# Patient Record
Sex: Female | Born: 1970 | Race: White | Hispanic: No | Marital: Married | Smoking: Current every day smoker
Health system: Southern US, Community
[De-identification: ages and names within clinical notes are randomized; demographics above are authoritative.]

## PROBLEM LIST (undated history)

## (undated) DIAGNOSIS — D649 Anemia, unspecified: Secondary | ICD-10-CM

## (undated) DIAGNOSIS — K279 Peptic ulcer, site unspecified, unspecified as acute or chronic, without hemorrhage or perforation: Secondary | ICD-10-CM

## (undated) DIAGNOSIS — F32A Depression, unspecified: Secondary | ICD-10-CM

## (undated) DIAGNOSIS — I10 Essential (primary) hypertension: Secondary | ICD-10-CM

## (undated) DIAGNOSIS — Z8489 Family history of other specified conditions: Secondary | ICD-10-CM

## (undated) DIAGNOSIS — F419 Anxiety disorder, unspecified: Secondary | ICD-10-CM

## (undated) DIAGNOSIS — F329 Major depressive disorder, single episode, unspecified: Secondary | ICD-10-CM

## (undated) DIAGNOSIS — K219 Gastro-esophageal reflux disease without esophagitis: Secondary | ICD-10-CM

## (undated) DIAGNOSIS — K859 Acute pancreatitis without necrosis or infection, unspecified: Secondary | ICD-10-CM

## (undated) HISTORY — PX: INDUCED ABORTION: SHX677

---

## 2004-12-06 ENCOUNTER — Ambulatory Visit: Payer: Self-pay | Admitting: Internal Medicine

## 2005-05-17 ENCOUNTER — Ambulatory Visit: Payer: Self-pay | Admitting: Internal Medicine

## 2005-05-23 ENCOUNTER — Ambulatory Visit: Payer: Self-pay | Admitting: Internal Medicine

## 2006-04-07 ENCOUNTER — Emergency Department: Payer: Self-pay | Admitting: Unknown Physician Specialty

## 2006-08-26 ENCOUNTER — Inpatient Hospital Stay: Payer: Self-pay | Admitting: Internal Medicine

## 2006-11-21 ENCOUNTER — Inpatient Hospital Stay: Payer: Self-pay | Admitting: Internal Medicine

## 2010-01-17 ENCOUNTER — Ambulatory Visit: Payer: Self-pay | Admitting: Certified Nurse Midwife

## 2011-02-12 ENCOUNTER — Ambulatory Visit: Payer: Self-pay

## 2011-04-15 ENCOUNTER — Emergency Department: Payer: Self-pay | Admitting: Emergency Medicine

## 2014-04-26 ENCOUNTER — Emergency Department: Payer: Self-pay | Admitting: Emergency Medicine

## 2014-04-26 LAB — COMPREHENSIVE METABOLIC PANEL
ALK PHOS: 139 U/L — AB
Albumin: 3.9 g/dL (ref 3.4–5.0)
Anion Gap: 10 (ref 7–16)
BILIRUBIN TOTAL: 1.8 mg/dL — AB (ref 0.2–1.0)
BUN: 14 mg/dL (ref 7–18)
CALCIUM: 9.9 mg/dL (ref 8.5–10.1)
CHLORIDE: 95 mmol/L — AB (ref 98–107)
CO2: 30 mmol/L (ref 21–32)
CREATININE: 0.77 mg/dL (ref 0.60–1.30)
EGFR (Non-African Amer.): >60
Glucose: 138 mg/dL — ABNORMAL HIGH (ref 65–99)
Osmolality: 273 (ref 275–301)
Potassium: 2.7 mmol/L — ABNORMAL LOW (ref 3.5–5.1)
SGOT(AST): 36 U/L (ref 15–37)
SGPT (ALT): 34 U/L
SODIUM: 135 mmol/L — AB (ref 136–145)
TOTAL PROTEIN: 9 g/dL — AB (ref 6.4–8.2)

## 2014-04-26 LAB — TROPONIN I: Troponin-I: <0.02 ng/mL

## 2014-04-26 LAB — CBC WITH DIFFERENTIAL/PLATELET
Basophil #: 0 10*3/uL (ref 0.0–0.1)
Basophil %: 0.2 %
Eosinophil #: 0 10*3/uL (ref 0.0–0.7)
Eosinophil %: 0.2 %
HCT: 43.1 % (ref 35.0–47.0)
HGB: 14.1 g/dL (ref 12.0–16.0)
Lymphocyte #: 1.3 10*3/uL (ref 1.0–3.6)
Lymphocyte %: 9.4 %
MCH: 33.5 pg (ref 26.0–34.0)
MCHC: 32.7 g/dL (ref 32.0–36.0)
MCV: 102 fL — ABNORMAL HIGH (ref 80–100)
Monocyte #: 0.8 x10 3/mm (ref 0.2–0.9)
Monocyte %: 5.6 %
NEUTROS PCT: 84.6 %
Neutrophil #: 11.4 10*3/uL — ABNORMAL HIGH (ref 1.4–6.5)
Platelet: 268 10*3/uL (ref 150–440)
RBC: 4.22 10*6/uL (ref 3.80–5.20)
RDW: 20.1 % — ABNORMAL HIGH (ref 11.5–14.5)
WBC: 13.5 10*3/uL — AB (ref 3.6–11.0)

## 2014-04-26 LAB — LIPASE, BLOOD: LIPASE: 111 U/L (ref 73–393)

## 2014-04-27 ENCOUNTER — Encounter (HOSPITAL_COMMUNITY): Payer: Self-pay | Admitting: Emergency Medicine

## 2014-04-27 DIAGNOSIS — Z8719 Personal history of other diseases of the digestive system: Secondary | ICD-10-CM | POA: Diagnosis not present

## 2014-04-27 DIAGNOSIS — R197 Diarrhea, unspecified: Secondary | ICD-10-CM | POA: Diagnosis not present

## 2014-04-27 DIAGNOSIS — Z3202 Encounter for pregnancy test, result negative: Secondary | ICD-10-CM | POA: Insufficient documentation

## 2014-04-27 DIAGNOSIS — R112 Nausea with vomiting, unspecified: Secondary | ICD-10-CM | POA: Insufficient documentation

## 2014-04-27 DIAGNOSIS — R1013 Epigastric pain: Secondary | ICD-10-CM | POA: Diagnosis not present

## 2014-04-27 DIAGNOSIS — Z720 Tobacco use: Secondary | ICD-10-CM | POA: Diagnosis not present

## 2014-04-27 MED ORDER — ONDANSETRON 4 MG PO TBDP
8.0000 mg | ORAL_TABLET | Freq: Once | ORAL | Status: AC
  Administered 2014-04-27: 8 mg via ORAL
  Filled 2014-04-27: qty 2

## 2014-04-27 NOTE — ED Notes (Addendum)
Pt c/o n/v and abdominal pain for two weeks. No fevers at home. Pt states she has hx of pancreatitis with similar symptoms. Last BM was two hours ago and loose.

## 2014-04-27 NOTE — ED Notes (Signed)
Pt reports emesis 3-4 times per day. Unable to keep food and drink down.

## 2014-04-28 ENCOUNTER — Emergency Department (HOSPITAL_COMMUNITY)
Admission: EM | Admit: 2014-04-28 | Discharge: 2014-04-28 | Disposition: A | Discharge status: Home / Self Care | Payer: No Typology Code available for payment source | Attending: Emergency Medicine | Admitting: Emergency Medicine

## 2014-04-28 DIAGNOSIS — R112 Nausea with vomiting, unspecified: Secondary | ICD-10-CM

## 2014-04-28 HISTORY — DX: Acute pancreatitis without necrosis or infection, unspecified: K85.90

## 2014-04-28 LAB — CBC WITH DIFFERENTIAL/PLATELET
BASOS PCT: 0 % (ref 0–1)
Basophils Absolute: 0 10*3/uL (ref 0.0–0.1)
EOS ABS: 0.1 10*3/uL (ref 0.0–0.7)
Eosinophils Relative: 0 % (ref 0–5)
HCT: 40.1 % (ref 36.0–46.0)
Hemoglobin: 14 g/dL (ref 12.0–15.0)
Lymphocytes Relative: 14 % (ref 12–46)
Lymphs Abs: 1.6 10*3/uL (ref 0.7–4.0)
MCH: 34 pg (ref 26.0–34.0)
MCHC: 34.9 g/dL (ref 30.0–36.0)
MCV: 97.3 fL (ref 78.0–100.0)
MONOS PCT: 6 % (ref 3–12)
Monocytes Absolute: 0.6 10*3/uL (ref 0.1–1.0)
NEUTROS PCT: 80 % — AB (ref 43–77)
Neutro Abs: 9.2 10*3/uL — ABNORMAL HIGH (ref 1.7–7.7)
Platelets: 261 10*3/uL (ref 150–400)
RBC: 4.12 MIL/uL (ref 3.87–5.11)
RDW: 17.1 % — ABNORMAL HIGH (ref 11.5–15.5)
WBC: 11.5 10*3/uL — ABNORMAL HIGH (ref 4.0–10.5)

## 2014-04-28 LAB — URINALYSIS, ROUTINE W REFLEX MICROSCOPIC
Glucose, UA: NEGATIVE mg/dL
Ketones, ur: >80 mg/dL — AB
NITRITE: NEGATIVE
PROTEIN: 30 mg/dL — AB
Specific Gravity, Urine: 1.026 (ref 1.005–1.030)
Urobilinogen, UA: 1 mg/dL (ref 0.0–1.0)
pH: 6 (ref 5.0–8.0)

## 2014-04-28 LAB — COMPREHENSIVE METABOLIC PANEL
ALBUMIN: 3.9 g/dL (ref 3.5–5.2)
ALT: 25 U/L (ref 0–35)
ANION GAP: 21 — AB (ref 5–15)
AST: 35 U/L (ref 0–37)
Alkaline Phosphatase: 134 U/L — ABNORMAL HIGH (ref 39–117)
BUN: 14 mg/dL (ref 6–23)
CALCIUM: 10 mg/dL (ref 8.4–10.5)
CO2: 23 mEq/L (ref 19–32)
CREATININE: 0.47 mg/dL — AB (ref 0.50–1.10)
Chloride: 97 mEq/L (ref 96–112)
GFR calc Af Amer: >90 mL/min (ref >90)
GFR calc non Af Amer: >90 mL/min (ref >90)
Glucose, Bld: 107 mg/dL — ABNORMAL HIGH (ref 70–99)
Potassium: 3.1 mEq/L — ABNORMAL LOW (ref 3.7–5.3)
Sodium: 141 mEq/L (ref 137–147)
TOTAL PROTEIN: 8.2 g/dL (ref 6.0–8.3)
Total Bilirubin: 1.1 mg/dL (ref 0.3–1.2)

## 2014-04-28 LAB — PREGNANCY, URINE: PREG TEST UR: NEGATIVE

## 2014-04-28 LAB — I-STAT CG4 LACTIC ACID, ED: LACTIC ACID, VENOUS: 1.12 mmol/L (ref 0.5–2.2)

## 2014-04-28 LAB — URINE MICROSCOPIC-ADD ON

## 2014-04-28 LAB — LIPASE, BLOOD: LIPASE: 21 U/L (ref 11–59)

## 2014-04-28 MED ORDER — SODIUM CHLORIDE 0.9 % IV BOLUS (SEPSIS)
1000.0000 mL | Freq: Once | INTRAVENOUS | Status: AC
  Administered 2014-04-28: 1000 mL via INTRAVENOUS

## 2014-04-28 MED ORDER — ONDANSETRON HCL 4 MG/2ML IJ SOLN
4.0000 mg | Freq: Once | INTRAMUSCULAR | Status: AC
  Administered 2014-04-28: 4 mg via INTRAVENOUS
  Filled 2014-04-28: qty 2

## 2014-04-28 MED ORDER — PANTOPRAZOLE SODIUM 40 MG IV SOLR
40.0000 mg | Freq: Once | INTRAVENOUS | Status: AC
  Administered 2014-04-28: 40 mg via INTRAVENOUS
  Filled 2014-04-28: qty 40

## 2014-04-28 MED ORDER — POTASSIUM CHLORIDE 10 MEQ/100ML IV SOLN
10.0000 meq | INTRAVENOUS | Status: AC
  Administered 2014-04-28: 10 meq via INTRAVENOUS
  Filled 2014-04-28: qty 100

## 2014-04-28 MED ORDER — ONDANSETRON 4 MG PO TBDP: 4.0000 mg | ORAL_TABLET | Freq: Three times a day (TID) | ORAL | Status: DC | PRN

## 2014-04-28 MED ORDER — PROMETHAZINE HCL 25 MG RE SUPP: 25.0000 mg | Freq: Four times a day (QID) | RECTAL | Status: DC | PRN

## 2014-04-28 NOTE — Discharge Instructions (Signed)
Please read and follow all provided instructions.  Your diagnoses today include:  1. Non-intractable vomiting with nausea, vomiting of unspecified type    Tests performed today include:  Blood counts and electrolytes  Blood tests to check liver and kidney function  Blood tests to check pancreas function - no pancreatitis  Urine test to look for infection - shows dehydration  Vital signs. See below for your results today.   Medications prescribed:   Zofran (ondansetron) - for nausea and vomiting   Phenergan (promethazine) - for nausea and vomiting  Take any prescribed medications only as directed.  Home care instructions:   Follow any educational materials contained in this packet.   Please take the previously prescribed omeprazole and carafate as directed.     Drink clear liquids for the next 24 hours and introduce solid foods slowly after 24 hours using the b.r.a.t. diet (Bananas, Rice, Applesauce, Toast, Yogurt).    Follow-up instructions: Please follow-up with your primary care provider in the next 2 days for further evaluation of your symptoms. If you are not feeling better in 48 hours you may have a condition that is more serious and you need re-evaluation.   Return instructions:  SEEK IMMEDIATE MEDICAL ATTENTION IF:  If you have pain that does not go away or becomes severe   A temperature above 101F develops   Repeated vomiting occurs (multiple episodes)   If you have pain that becomes localized to portions of the abdomen. The right side could possibly be appendicitis. In an adult, the left lower portion of the abdomen could be colitis or diverticulitis.   Blood is being passed in stools or vomit (bright red or black tarry stools)   You develop chest pain, difficulty breathing, dizziness or fainting, or become confused, poorly responsive, or inconsolable (young children)  If you have any other emergent concerns regarding your health  Additional  Information: Abdominal (belly) pain can be caused by many things. Your caregiver performed an examination and possibly ordered blood/urine tests and imaging (CT scan, x-rays, ultrasound). Many cases can be observed and treated at home after initial evaluation in the emergency department. Even though you are being discharged home, abdominal pain can be unpredictable. Therefore, you need a repeated exam if your pain does not resolve, returns, or worsens. Most patients with abdominal pain don't have to be admitted to the hospital or have surgery, but serious problems like appendicitis and gallbladder attacks can start out as nonspecific pain. Many abdominal conditions cannot be diagnosed in one visit, so follow-up evaluations are very important.  Your vital signs today were: BP 162/83   Pulse 64   Temp(Src) 97.5 F (36.4 C) (Oral)   Resp 17   Ht 5\' 2"  (1.575 m)   SpO2 100% If your blood pressure (bp) was elevated above 135/85 this visit, please have this repeated by your doctor within one month. --------------  Emergency Department Resource Guide 1) Find a Doctor and Pay Out of Pocket Although you won't have to find out who is covered by your insurance plan, it is a good idea to ask around and get recommendations. You will then need to call the office and see if the doctor you have chosen will accept you as a new patient and what types of options they offer for patients who are self-pay. Some doctors offer discounts or will set up payment plans for their patients who do not have insurance, but you will need to ask so you aren't surprised when you  get to your appointment.  2) Contact Your Local Health Department Not all health departments have doctors that can see patients for sick visits, but many do, so it is worth a call to see if yours does. If you don't know where your local health department is, you can check in your phone book. The CDC also has a tool to help you locate your state's health  department, and many state websites also have listings of all of their local health departments.  3) Find a [facility] If your illness is not likely to be very severe or complicated, you may want to try a walk in clinic. These are popping up all over the country in pharmacies, drugstores, and shopping centers. They're usually staffed by nurse practitioners or physician assistants that have been trained to treat common illnesses and complaints. They're usually fairly quick and inexpensive. However, if you have serious medical issues or chronic medical problems, these are probably not your best option.  No Primary Care Doctor: - Call Health Connect at  [phone] - they can help you locate a primary care doctor that  accepts your insurance, provides certain services, etc. - Physician Referral Service- [phone]  Chronic Pain Problems: Organization         Address  Phone   Notes  [facility]  [phone] Patients need to be referred by their primary care doctor.   Medication Assistance: Organization         Address  Phone   Notes  [facility] Medication [facility] [place] [place], [place] [address] [phone] --Must be a resident of [facility] -- Must have NO insurance coverage whatsoever (no Medicaid/ Medicare, etc.) -- The pt. MUST have a primary care doctor that directs their care regularly and follows them in the community   MedAssist  [phone]   [employer]  [phone]    Agencies that provide inexpensive medical care: Organization         Address  Phone   Notes  [place]  [phone]   [facility]    [phone]   [facility] [place], [place] [address] [phone]   [place] [address] [place] [phone]   Planned Parenthood    [phone]   [facility]    [phone]    [place] and [place] [address], [place] Phone:  [phone], Fax:  [phone] Hours of Operation:  9 am - 6 pm, M-F.  Also accepts Medicaid/Medicare and self-pay.  [facility] for [place], [address], [place] Phone: [phone], Fax: [phone]. Hours of Operation:  8:30 am - 5:30 pm, M-F.  Also accepts Medicaid and self-pay.  [facility] [place] [address], [place] Phone: [phone]   [place], [place], [place] [phone], Ext. [phone] Mondays & Thursdays: 7-9 AM.  First 15 patients are seen on a first come, first serve basis.    [place] Providers:  Organization         Address  Phone   Notes  [facility] [address], [place] [phone] Also accepts self-pay patients.  [place], [place]  [phone]   [facility]  [address] [place] [phone]   [place] [address], [place] [phone]   Lucianne Lei [address], [place]   [phone] Only accepts [place] Access [place] patients after they have their name applied to their card.   Self-Pay (no insurance) in [facility]:  Organization         Address  Phone   Notes  Sickle Cell Patients, [facility] [place] [phone]   [facility] [place] [phone]   [facility] [place]  [place], [place],  [phone]   [facility]/Dr. Osei-Bonsu  [address] [place] or [address], [place] [phone] Phone number for both [place] and [place] locations is the same.  Urgent Medical and [facility] [address], [place] [phone]    [facility] [address], [place] or [address] [phone]   [facility] [address], [place] [phone], phone; [phone], fax Sees patients 1st and 3rd Saturday of every month.  Must not qualify for public or private insurance (i.e. Medicaid, Medicare, [place] Health Choice, Veterans' Benefits)  Household income should be no more than 200% of the poverty level The clinic cannot treat you if you are pregnant or think you are pregnant  Sexually transmitted diseases are not treated at the clinic.    Dental Care: Organization         Address  Phone  Notes  [facility] Department of [facility] [place] [phone] Accepts children up to age 61 who are enrolled in [place] or [place]; pregnant women with a Medicaid card; and children who have applied for Medicaid or [place] Health Choice, but were declined, whose parents can pay a reduced fee at time of service.  [facility] Department of [facility]  [address], [place] [phone] Accepts children up to age 17 who are enrolled in [place] or [place]; pregnant women with a Medicaid card; and children who have applied for Medicaid or [place] Health Choice, but were declined, whose parents can pay a reduced fee at time of service.  [facility] Adult Dental Access PROGRAM  [place] [phone] Patients are seen by appointment only. Walk-ins are not accepted. [place] will see patients 46 years of age and older. Monday - Tuesday (8am-5pm) Most Wednesdays (8:30-5pm) $30 per visit, cash only  [facility] Adult Dental Access PROGRAM  [address], [facility] [phone] Patients are seen by appointment only. Walk-ins are not accepted. [place] will see patients 76 years of age and older. One Wednesday Evening (Monthly: Volunteer Based).   $30 per visit, cash only  [place]  [phone] for adults; Children under age 69, call Graduate Pediatric Dentistry at [phone]. Children aged 54-14, please call [phone] to request a pediatric application.  Dental services are provided in all areas of dental care including fillings, crowns and bridges, complete and partial dentures, implants, gum treatment, root canals, and extractions. Preventive care is also provided. Treatment is provided to both adults and children. Patients are selected via a lottery and there is often a waiting list.   [facility] [address], [place]  [phone] [email or web address]   [facility] [address] [place], [place] [phone], Ext. [phone] Second and Fourth Thursday of each month, opens at 6:30 AM; Clinic ends at 9 AM.  Patients are seen on a first-come first-served basis, and a limited number are seen during each clinic.   [facility]  [address] [place] Danker [place], [place] [phone]   Eligibility Requirements You must have lived in [place], [place], or [place] counties for at least the last three months.   You cannot be eligible for state or federal sponsored [employer], including [employer], [place], or [employer].   You generally cannot be eligible for healthcare insurance through your employer.    How to apply: Eligibility screenings are held every Tuesday and Wednesday afternoon from 1:00 pm until 4:00 pm. You do not need an appointment for the interview!  [facility] [address], [place], [place]   [place]  [place] Department  [place]  [phone]    Behavioral Health Resources in the Community: Intensive Outpatient Programs Organization         Address  Phone  Notes  [place]. [address], [place], [place] [phone]   [facility] Outpatient [address] [place], [place]   ADS: Alcohol & Drug Svcs [address], [place], [place]   [place] [address]  [place], [place] or [phone]   Substance Abuse Resources Organization         Address  Phone  Notes  Alcohol and Drug Services  [phone]   [place]  [phone]   The [place]   Chinita Pester  [phone]   Residential & Outpatient Substance Abuse Program  [phone]   Psychological Services Organization         Address  Phone  Notes  [facility] [place]  [place]  [phone]   [place] [address] [place] or [phone]    Mobile Crisis Teams Organization         Address  Phone  Notes  [facility], Mobile Crisis Care Unit  [phone]   [facility]  [address] [place], [place]   [place] [address], [place] [phone]    Self-Help/Support Groups Organization         Address  Phone             Notes  [place]. of [place] - variety of support groups  [place] Call for more information  Narcotics Anonymous (NA), [facility] [address], [employer] [place]  2 meetings at this location   Special educational needs teacher         Address  Phone  Notes  [facility] [place],    [place]  [phone]   [facility]  [address], [place] [address], [place], [place]   [place], [place] [phone] Admissions: 8am-3pm M-F  Incentives Substance [place] [address]    [place], [place] [phone]   [facility] [address] [place], [place], [place]   The [place].,  [place], [place]   [facility] - Intensive Outpatient [address], [place], [place]   [facility] [address]  [place], [place] [phone] or [phone]   [facility] Services (RTS) [address], [place], [place] Accepts Medicaid  [facility] [place] [address],  [place] [phone] Substance Abuse/Addiction Treatment   [facility] Organization         Address  Phone  Notes  [facility]  [phone]   Domenic Schwab, PhD [address] Arlis Porta [place], [place]   [phone] or [phone]   [place], [place] [phone]   [facility] [address] [place], [place] [phone] Insurance/Medicaid/sponsorship through [facility] and Families [address], [address] [place]                                    [place], [place] [phone] [place] [address], [place] [phone]    Dr. Adele Schilder  [phone]   [facility] [place] Dept. 1) [address] [place] 2) [place] 3)  [place] [address], [place] [phone]  [phone]   [place] [phone] or [phone] (After Hours)

## 2014-04-28 NOTE — ED Notes (Signed)
Pt reports she feels better, tolerating PO fluids well.

## 2014-04-28 NOTE — ED Provider Notes (Signed)
CSN: [id number]     Arrival date & time 04/27/14  2335 History   First MD Initiated Contact with Patient 04/28/14 [phone]     Chief Complaint  Patient presents with  . Nausea  . Emesis  . Abdominal Pain     (Consider location/radiation/quality/duration/timing/severity/associated sxs/prior Treatment) HPI Comments: Patient with history of alcoholic pancreatitis presents with complaint of nausea and vomiting for the past 1-2 weeks. She states that she vomits after eating any solids or liquids. Patient was drinking greater than 3 drinks of liquor per night prior to her symptoms but has not since been drinking. Patient [facility] and was admitted overnight and discharged home with Zofran, Carafate, omeprazole which she has not started taking. She states that she had an ultrasound performed there was not told of the results. Her symptoms did not improve upon returning home. She does not have any abdominal pain except for some mild epigastric soreness associated with vomiting. She denies fever, chest pain or cough. She denies dysuria or hematuria. No skin rashes. She has been having loose stools.  Patient is a 43 y.o. female presenting with vomiting and abdominal pain. The history is provided by the patient, the spouse and a relative.  Emesis Associated symptoms: diarrhea   Associated symptoms: no abdominal pain, no headaches, no myalgias and no sore throat   Abdominal Pain Associated symptoms: diarrhea, nausea and vomiting   Associated symptoms: no chest pain, no cough, no dysuria, no fever, no shortness of breath and no sore throat     Past Medical History  Diagnosis Date  . Pancreatitis    History reviewed. No pertinent past surgical history. History reviewed. No pertinent family history. History  Substance Use Topics  . Smoking status: Current Every Day Smoker    Types: Cigarettes  . Smokeless tobacco: Not on file  . Alcohol Use: 1.2 oz/week    2  Glasses of wine per week     Comment: two glasses a day   OB History   Grav Para Term Preterm Abortions TAB SAB Ect Mult Living                 Review of Systems  Constitutional: Negative for fever.  HENT: Negative for rhinorrhea and sore throat.   Eyes: Negative for redness.  Respiratory: Negative for cough and shortness of breath.   Cardiovascular: Negative for chest pain.  Gastrointestinal: Positive for nausea, vomiting and diarrhea. Negative for abdominal pain and blood in stool.  Genitourinary: Negative for dysuria.  Musculoskeletal: Negative for myalgias.  Skin: Negative for rash.  Neurological: Negative for headaches.      Allergies  Review of patient's allergies indicates no known allergies.  Home Medications   Prior to Admission medications   Medication Sig Start Date End Date Taking? Authorizing Provider  promethazine (PHENERGAN) 25 MG tablet Take 25 mg by mouth every 6 (six) hours as needed for nausea or vomiting.   Yes Historical Provider, MD   BP 154/77  Pulse 70  Temp(Src) 97.5 F (36.4 C) (Oral)  Resp 21  Ht 5\' 2"  (1.575 m)  SpO2 97%  Physical Exam  Nursing note and vitals reviewed. Constitutional: She appears well-developed and well-nourished.  HENT:  Head: Normocephalic and atraumatic.  Mouth/Throat: Oropharynx is clear and moist.  Eyes: Conjunctivae are normal. Right eye exhibits no discharge. Left eye exhibits no discharge.  Neck: Normal range of motion. Neck supple.  Cardiovascular: Normal rate, regular rhythm and normal heart  sounds.   No murmur heard. Pulmonary/Chest: Effort normal and breath sounds normal. No respiratory distress. She has no wheezes. She has no rales.  Abdominal: Soft. Bowel sounds are normal. There is no tenderness. There is no rebound and no guarding.  Musculoskeletal: She exhibits no edema and no tenderness.  Neurological: She is alert.  Skin: Skin is warm and dry.  Psychiatric: She has a normal mood and affect.     ED Course  Procedures (including critical care time) Labs Review Labs Reviewed  CBC WITH DIFFERENTIAL - Abnormal; Notable for the following:    WBC 11.5 (*)    RDW 17.1 (*)    Neutrophils Relative % 80 (*)    Neutro Abs 9.2 (*)    All other components within normal limits  COMPREHENSIVE METABOLIC PANEL - Abnormal; Notable for the following:    Potassium 3.1 (*)    Glucose, Bld 107 (*)    Creatinine, Ser 0.47 (*)    Alkaline Phosphatase 134 (*)    Anion gap 21 (*)    All other components within normal limits  URINALYSIS, ROUTINE W REFLEX MICROSCOPIC - Abnormal; Notable for the following:    Color, Urine AMBER (*)    APPearance CLOUDY (*)    Hgb urine dipstick LARGE (*)    Bilirubin Urine MODERATE (*)    Ketones, ur >80 (*)    Protein, ur 30 (*)    Leukocytes, UA TRACE (*)    All other components within normal limits  URINE MICROSCOPIC-ADD ON - Abnormal; Notable for the following:    Squamous Epithelial / LPF MANY (*)    Bacteria, UA MANY (*)    All other components within normal limits  LIPASE, BLOOD  PREGNANCY, URINE  I-STAT CG4 LACTIC ACID, ED    Imaging Review No results found.   EKG Interpretation None      1:02 AM Patient seen and examined. Work-up initiated. Medications ordered.   Vital signs reviewed and are as follows: BP 154/77  Pulse 70  Temp(Src) 97.5 F (36.4 C) (Oral)  Resp 21  Ht 5\' 2"  (1.575 m)  SpO2 97%  5:19 AM Patient has done well. No vomiting. Nausea was well-controlled with Zofran. She has been given IV fluids and transitioned to clear liquids without vomiting or significant nausea. At this point, we agreed that trial of carafate/omeprazole and antiemetic is appropriate. She is comfortable with this.   Abd remains soft with no tenderness on re-exam.   Pt discussed with Dr. Stevie Kern earlier.   I have given PCP referrals/rescource guide per patient request.   Will d/c to home with ODT Zofran and Phenergan suppositories (she was rx  Zofran tabs at [facility]).   She will do clear liquids for at least 24 hrs and slowly transition to b.r.a.t. Diet.  The patient was urged to return to the Emergency Department immediately with worsening of current symptoms, worsening abdominal pain, persistent vomiting, blood noted in stools, fever, or any other concerns. The patient verbalized understanding.    MDM   Final diagnoses:  Non-intractable vomiting with nausea, vomiting of unspecified type   Patient with symptoms consistent with gastritis. Vitals are stable, no fever. Now tolerating PO's. She has not yet exhausted outpatient treatment. Given controlled symptoms, feel another outpatient trial is warranted. Pt appears well. Lungs are clear. No focal abdominal pain, no concern for appendicitis, cholecystitis, pancreatitis, ruptured viscus, UTI, kidney stone, or any other abdominal etiology. Supportive therapy indicated with return if symptoms worsen. Patient counseled.  Carlisle Cater, PA-C 04/28/14 [phone]

## 2014-04-28 NOTE — ED Notes (Signed)
Gave pt a ginger ale per her request to see how well she holds it down. Instructed pt to drink the beverage slowly.

## 2014-04-30 ENCOUNTER — Ambulatory Visit: Payer: No Typology Code available for payment source | Admitting: Family Medicine

## 2014-04-30 NOTE — ED Provider Notes (Signed)
Medical screening examination/treatment/procedure(s) were performed by non-physician practitioner and as supervising physician I was immediately available for consultation/collaboration.   EKG Interpretation None       Babette Relic, MD 04/30/14 1900

## 2014-11-18 ENCOUNTER — Ambulatory Visit: Payer: No Typology Code available for payment source | Admitting: Family Medicine

## 2014-11-18 DIAGNOSIS — Z0289 Encounter for other administrative examinations: Secondary | ICD-10-CM

## 2016-12-14 ENCOUNTER — Ambulatory Visit: Payer: No Typology Code available for payment source | Admitting: Primary Care

## 2016-12-24 ENCOUNTER — Ambulatory Visit (INDEPENDENT_AMBULATORY_CARE_PROVIDER_SITE_OTHER): Payer: Commercial Managed Care - PPO | Admitting: Primary Care

## 2016-12-24 ENCOUNTER — Encounter: Payer: Self-pay | Admitting: Primary Care

## 2016-12-24 ENCOUNTER — Other Ambulatory Visit: Payer: Self-pay | Admitting: Primary Care

## 2016-12-24 VITALS — BP 140/84 | HR 58 | Temp 97.9°F | Ht 62.75 in | Wt 148.1 lb

## 2016-12-24 DIAGNOSIS — I10 Essential (primary) hypertension: Secondary | ICD-10-CM

## 2016-12-24 DIAGNOSIS — R202 Paresthesia of skin: Secondary | ICD-10-CM | POA: Diagnosis not present

## 2016-12-24 DIAGNOSIS — Z91038 Other insect allergy status: Secondary | ICD-10-CM

## 2016-12-24 DIAGNOSIS — G629 Polyneuropathy, unspecified: Secondary | ICD-10-CM | POA: Insufficient documentation

## 2016-12-24 LAB — HEMOGLOBIN A1C: Hgb A1c MFr Bld: 5.2 % (ref 4.6–6.5)

## 2016-12-24 LAB — CBC
HEMATOCRIT: 41.8 % (ref 36.0–46.0)
Hemoglobin: 14.8 g/dL (ref 12.0–15.0)
MCHC: 35.4 g/dL (ref 30.0–36.0)
MCV: 108.3 fl — ABNORMAL HIGH (ref 78.0–100.0)
PLATELETS: 174 10*3/uL (ref 150.0–400.0)
RBC: 3.86 Mil/uL — AB (ref 3.87–5.11)
RDW: 14.2 % (ref 11.5–15.5)
WBC: 6.1 10*3/uL (ref 4.0–10.5)

## 2016-12-24 LAB — COMPREHENSIVE METABOLIC PANEL
ALT: 13 U/L (ref 0–35)
AST: 29 U/L (ref 0–37)
Albumin: 4.6 g/dL (ref 3.5–5.2)
Alkaline Phosphatase: 87 U/L (ref 39–117)
BUN: 12 mg/dL (ref 6–23)
CHLORIDE: 107 meq/L (ref 96–112)
CO2: 29 meq/L (ref 19–32)
Calcium: 10.3 mg/dL (ref 8.4–10.5)
Creatinine, Ser: 0.78 mg/dL (ref 0.40–1.20)
GFR: 84.4 mL/min (ref >60.00)
GLUCOSE: 105 mg/dL — AB (ref 70–99)
Potassium: 4.3 mEq/L (ref 3.5–5.1)
SODIUM: 141 meq/L (ref 135–145)
TOTAL PROTEIN: 7.3 g/dL (ref 6.0–8.3)
Total Bilirubin: 0.9 mg/dL (ref 0.2–1.2)

## 2016-12-24 LAB — VITAMIN B12: VITAMIN B 12: 307 pg/mL (ref 211–911)

## 2016-12-24 LAB — TSH: TSH: 1.26 u[IU]/mL (ref 0.35–4.50)

## 2016-12-24 MED ORDER — HYDROXYZINE HCL 25 MG PO TABS
25.0000 mg | ORAL_TABLET | Freq: Three times a day (TID) | ORAL | 0 refills | Status: DC | PRN
Start: 2016-12-24 — End: 2017-09-26

## 2016-12-24 MED ORDER — LISINOPRIL 10 MG PO TABS: 10.0000 mg | ORAL_TABLET | Freq: Every day | ORAL | 0 refills | Status: DC

## 2016-12-24 NOTE — Assessment & Plan Note (Signed)
Four years of elevated BP readings at home, also with elevated BP today. Rx for lisinopril 10 mg sent to pharmacy. Follow up in 2 weeks for re-evaluation and BMP.

## 2016-12-24 NOTE — Progress Notes (Signed)
Subjective:    Patient ID: Maria [name], female    DOB: [birth year], 46 y.o.   MRN: [id number]  HPI  Maria [name] is a 46 year old female who presents today to establish care and discuss the problems mentioned below. Will obtain old records. Her last physical was years ago.  1) Elevated Blood Pressure Reading: BP reading of 140/84 in the office today. History of high readings over the last four years. She monitors her BP sporadically and gets readings 140's/80's. She is a smoker. She's never had treatment for high blood pressure. She denies headaches, dizziness, changes in vision. She has experienced intermittent tingling to her right wrist and bilateral feet over the last 2 months.  Diet currently consists of:  Breakfast: Nabs, [employer] Lunch: Potatoes, rice, salad Dinner: Nurse, adult protein, vegetables Snacks: Candy Desserts: Occasionally Beverages: [employer] ( 5, 12 ounce bottles daily)  Exercise: She does not routinely exercise   2) Numbness: Present to her right wrist and bilateral plantar feet and toes. This began about 2 months ago. She's smoked since age 68. She will noticed tingling when at rest and activity. She denies extremity pain, neck pain, shoulder pain, injury/trauma, weakness.  Review of Systems  Constitutional: Negative for fatigue.  Eyes: Negative for visual disturbance.  Respiratory: Negative for shortness of breath.   Cardiovascular: Negative for chest pain.  Neurological: Positive for numbness. Negative for dizziness and headaches.       Past Medical History:  Diagnosis Date  . Elevated BP without diagnosis of hypertension   . Pancreatitis      Social History   Social History  . Marital status: Married    Spouse name: N/A  . Number of children: N/A  . Years of education: N/A   Occupational History  . Not on file.   Social History Main Topics  . Smoking status: Current Every Day Smoker    Packs/day: 1.00    Types: Cigarettes  . Smokeless  tobacco: Never Used  . Alcohol use 1.2 oz/week    2 Glasses of wine per week     Comment: two glasses a day  . Drug use: No  . Sexual activity: Not on file   Other Topics Concern  . Not on file   Social History Narrative   Married.   1 child.    Works at [employer].   Enjoys swimming, camping    No past surgical history on file.  Family History  Problem Relation Age of Onset  . Arthritis Mother   . Arthritis Father   . Alcohol abuse Father   . Hypertension Father   . Heart disease Father   . Aneurysm Father   . Breast cancer Maternal Grandmother   . Breast cancer Paternal Grandmother     Allergies  Allergen Reactions  . Bupropion Other (See Comments)    Worsened anxiety  . Codeine Swelling  . Other Swelling    Beestings    No current outpatient prescriptions on file prior to visit.   No current facility-administered medications on file prior to visit.     BP 140/84   Pulse (!) 58   Temp 97.9 F (36.6 C) (Oral)   Ht 5' 2.75" (1.594 m)   Wt 148 lb 1.9 oz (67.2 kg)   LMP 12/18/2016   SpO2 98%   BMI 26.45 kg/m    Objective:   Physical Exam  Constitutional: She appears well-nourished.  Neck: Neck supple.  Cardiovascular: Normal rate and regular  rhythm.   Pulses:      Dorsalis pedis pulses are 2+ on the right side, and 2+ on the left side.       Posterior tibial pulses are 2+ on the right side, and 2+ on the left side.  Pulmonary/Chest: Effort normal and breath sounds normal.  Neurological:  Negative Phalen's and Tineals sign  Skin: Skin is warm and dry.  Spider veins to bilateral lower extremities   Psychiatric: She has a normal mood and affect.          Assessment & Plan:

## 2016-12-24 NOTE — Patient Instructions (Signed)
Complete lab work prior to leaving today. I will notify you of your results once received.   Start lisinopril 10 mg tablets for high blood pressure. Take 1 tablet by mouth once daily.  Check your blood pressure daily, around the same time of day, for the next 2 weeks.  Ensure that you have rested for 30 minutes prior to checking your blood pressure. Record your readings and bring them to your next visit.  Schedule a follow up visit in 2 weeks for re-evaluation of your blood pressure.  It was a pleasure to meet you today! Please don't hesitate to call me with any questions. Welcome to [facility]!

## 2016-12-24 NOTE — Assessment & Plan Note (Signed)
Suspect right wrist more carpal tunnel in nature. Tingling to feet could be secondary to vascular compromise from tobacco abuse or undiagnosed hyperglycemia. Check A1C, TSH, B12, CBC, CMP today. 2+ DP and PT bilaterally. No signs of CVA. Discussed to stop smoking.

## 2017-01-03 ENCOUNTER — Encounter: Payer: Self-pay | Admitting: *Deleted

## 2017-01-03 ENCOUNTER — Encounter (INDEPENDENT_AMBULATORY_CARE_PROVIDER_SITE_OTHER): Payer: Self-pay

## 2017-01-16 ENCOUNTER — Telehealth: Payer: Self-pay

## 2017-01-16 NOTE — Telephone Encounter (Signed)
Please schedule her for follow up of blood pressure. It's possible that the lisinopril could be causing her symptoms, but we will need to discuss in greater detail.

## 2017-01-16 NOTE — Telephone Encounter (Signed)
Pt left v/m; pt was seen 12/24/16 and pt was started on lisinopril 10 mg; pt is very lethargic and falls asleep at work; pt wants to know if this could be side effect to lisinopril; pt request cb; pt was seen 12/24/16 and was to f/u 2 wks; do not see future appt.

## 2017-01-17 NOTE — Telephone Encounter (Signed)
Message left for patient to return my call.  

## 2017-01-17 NOTE — Telephone Encounter (Signed)
Pt returned missed call 

## 2017-01-18 NOTE — Telephone Encounter (Signed)
Message left for patient to return my call.  

## 2017-01-23 NOTE — Telephone Encounter (Signed)
Message left for patient to return my call.  

## 2017-02-03 ENCOUNTER — Other Ambulatory Visit: Payer: Self-pay | Admitting: Primary Care

## 2017-02-03 DIAGNOSIS — I10 Essential (primary) hypertension: Secondary | ICD-10-CM

## 2017-02-18 ENCOUNTER — Other Ambulatory Visit: Payer: Self-pay | Admitting: Primary Care

## 2017-02-18 DIAGNOSIS — I10 Essential (primary) hypertension: Secondary | ICD-10-CM

## 2017-02-21 ENCOUNTER — Ambulatory Visit: Payer: Commercial Managed Care - PPO | Admitting: Primary Care

## 2017-02-21 ENCOUNTER — Telehealth: Payer: Self-pay | Admitting: Primary Care

## 2017-02-21 NOTE — Telephone Encounter (Signed)
Patient did not come in for their appointment today for bp follow up Please let me know if patient needs to be contacted immediately for follow up or no follow up needed. Do you want to charge the NSF?

## 2017-02-21 NOTE — Telephone Encounter (Signed)
Yes, reschedule, no fee.

## 2017-02-27 NOTE — Telephone Encounter (Signed)
Lm to reschedule appt

## 2017-03-01 NOTE — Telephone Encounter (Signed)
Scheduled for 09/05. Pt aware

## 2017-03-01 NOTE — Telephone Encounter (Signed)
This has not been charges yet. Can you cancel this appt?

## 2017-03-06 ENCOUNTER — Ambulatory Visit: Payer: Commercial Managed Care - PPO | Admitting: Primary Care

## 2017-03-06 DIAGNOSIS — Z0289 Encounter for other administrative examinations: Secondary | ICD-10-CM

## 2017-03-28 ENCOUNTER — Telehealth: Payer: Self-pay | Admitting: Primary Care

## 2017-03-28 NOTE — Telephone Encounter (Signed)
Please find any 30 minute slot or put together two 15 minute slots at her convenience.

## 2017-03-28 NOTE — Telephone Encounter (Signed)
Patient said if she doesn't answer, a detailed message can be left on her voice mail.

## 2017-03-28 NOTE — Telephone Encounter (Signed)
Patient need to have a physical done for work as soon as possible.  When can patient be fit in for a physical?

## 2017-03-29 NOTE — Telephone Encounter (Signed)
I spoke to patient and she scheduled physical on 04/02/17.

## 2017-04-02 ENCOUNTER — Encounter: Payer: Commercial Managed Care - PPO | Admitting: Primary Care

## 2017-04-02 DIAGNOSIS — Z0289 Encounter for other administrative examinations: Secondary | ICD-10-CM

## 2017-04-10 ENCOUNTER — Encounter: Payer: Commercial Managed Care - PPO | Admitting: Primary Care

## 2017-04-10 DIAGNOSIS — Z0289 Encounter for other administrative examinations: Secondary | ICD-10-CM

## 2017-05-09 ENCOUNTER — Telehealth: Payer: Self-pay | Admitting: Primary Care

## 2017-05-09 NOTE — Telephone Encounter (Signed)
Copied from [place] #[id number]. Topic: Inquiry >> May 09, 2017  1:56 PM Oliver Pila B wrote: Reason for CRM: PT called and would like for someone to call her or jus leave a message for her on a recommendation on a gynecologist

## 2017-05-13 NOTE — Telephone Encounter (Deleted)
Copied from [place] #[id number]. Topic: Inquiry >> May 09, 2017  1:56 PM Oliver Pila B wrote: Reason for CRM: PT called and would like for someone to call her or jus leave a message for her on a recommendation on a gynecologist

## 2017-05-13 NOTE — Telephone Encounter (Signed)
[facility] in [place]. [facility] at [facility] Any of these would be near her home, we work with all of these offices.

## 2017-05-13 NOTE — Telephone Encounter (Signed)
Spoken and notified patient of Kate's comments. Patient verbalized understanding. 

## 2017-05-16 ENCOUNTER — Encounter: Payer: Self-pay | Admitting: Primary Care

## 2017-05-16 ENCOUNTER — Ambulatory Visit: Payer: Commercial Managed Care - PPO | Admitting: Internal Medicine

## 2017-05-16 ENCOUNTER — Encounter: Payer: Self-pay | Admitting: Internal Medicine

## 2017-05-16 VITALS — BP 140/84 | HR 60 | Temp 98.2°F | Wt 149.0 lb

## 2017-05-16 DIAGNOSIS — J069 Acute upper respiratory infection, unspecified: Secondary | ICD-10-CM

## 2017-05-16 DIAGNOSIS — B9789 Other viral agents as the cause of diseases classified elsewhere: Secondary | ICD-10-CM

## 2017-05-16 NOTE — Progress Notes (Signed)
HPI  Pt presents to the clinic today with c/o facial pressure and cough. This started 3 days ago. She denies runny nose or nasal congestion. The cough is productive of green mucous. She denies fever, chills or body aches, but has felt fatigued. She has tried [employer] and Tylenol with minimal relief. She has no history of allergies. She does smoke and she has had sick contacts.  Review of Systems     Past Medical History:  Diagnosis Date  . Elevated BP without diagnosis of hypertension   . Pancreatitis     Family History  Problem Relation Age of Onset  . Arthritis Mother   . Arthritis Father   . Alcohol abuse Father   . Hypertension Father   . Heart disease Father   . Aneurysm Father   . Breast cancer Maternal Grandmother   . Breast cancer Paternal Grandmother     Social History   Socioeconomic History  . Marital status: Married    Spouse name: Not on file  . Number of children: Not on file  . Years of education: Not on file  . Highest education level: Not on file  Social Needs  . Financial resource strain: Not on file  . Food insecurity - worry: Not on file  . Food insecurity - inability: Not on file  . Transportation needs - medical: Not on file  . Transportation needs - non-medical: Not on file  Occupational History  . Not on file  Tobacco Use  . Smoking status: Current Every Day Smoker    Packs/day: 1.00    Types: Cigarettes  . Smokeless tobacco: Never Used  Substance and Sexual Activity  . Alcohol use: Yes    Alcohol/week: 1.2 oz    Types: 2 Glasses of wine per week    Comment: two glasses a day  . Drug use: No  . Sexual activity: Not on file  Other Topics Concern  . Not on file  Social History Narrative   Married.   1 child.    Works at [employer].   Enjoys swimming, camping    Allergies  Allergen Reactions  . Bupropion Other (See Comments)    Worsened anxiety  . Codeine Swelling  . Other Swelling    Beestings     Constitutional:  Positive fatigue. Denies headache, fever or abrupt weight changes.  HEENT:  Positive facial pain. Denies eye redness, ear pain, ringing in the ears, wax buildup, runny nose, nasal congestion or sore throat. Respiratory: Positive cough. Denies difficulty breathing or shortness of breath.  Cardiovascular: Denies chest pain, chest tightness, palpitations or swelling in the hands or feet.   No other specific complaints in a complete review of systems (except as listed in HPI above).  Objective:   BP 140/84   Pulse 60   Temp 98.2 F (36.8 C) (Oral)   Wt 149 lb (67.6 kg)   SpO2 98%   BMI 26.60 kg/m   General: Appears her stated age, in NAD. HEENT: Head: normal shape and size, no sinus tenderness noted;  Ears: Tm's gray and intact, normal light reflex; Nose: mucosa pink and moist, septum midline; Throat/Mouth: + PND. Teeth present, mucosa erythematous and moist, no exudate noted, no lesions or ulcerations noted.  Neck:  No adenopathy noted.  Cardiovascular: Normal rate and rhythm. S1,S2 noted.  No murmur, rubs or gallops noted.  Pulmonary/Chest: Normal effort and positive vesicular breath sounds. No respiratory distress. No wheezes, rales or ronchi noted.  Assessment & Plan:   Viral URI with Cough  Start Mucinex 600 mg every 12 hours Ibuprofen as needed  Delsym or Nyquil as needed for cough  RTC as needed or if symptoms persist. Webb Silversmith, NP

## 2017-05-16 NOTE — Patient Instructions (Signed)
Upper Respiratory Infection, Adult Most upper respiratory infections (URIs) are caused by a virus. A URI affects the nose, throat, and upper air passages. The most common type of URI is often called "the common cold." Follow these instructions at home:  Take medicines only as told by your doctor.  Gargle warm saltwater or take cough drops to comfort your throat as told by your doctor.  Use a warm mist humidifier or inhale steam from a shower to increase air moisture. This may make it easier to breathe.  Drink enough fluid to keep your pee (urine) clear or pale yellow.  Eat soups and other clear broths.  Have a healthy diet.  Rest as needed.  Go back to work when your fever is gone or your doctor says it is okay. ? You may need to stay home longer to avoid giving your URI to others. ? You can also wear a face mask and wash your hands often to prevent spread of the virus.  Use your inhaler more if you have asthma.  Do not use any tobacco products, including cigarettes, chewing tobacco, or electronic cigarettes. If you need help quitting, ask your doctor. Contact a doctor if:  You are getting worse, not better.  Your symptoms are not helped by medicine.  You have chills.  You are getting more short of breath.  You have brown or red mucus.  You have yellow or brown discharge from your nose.  You have pain in your face, especially when you bend forward.  You have a fever.  You have puffy (swollen) neck glands.  You have pain while swallowing.  You have white areas in the back of your throat. Get help right away if:  You have very bad or constant: ? Headache. ? Ear pain. ? Pain in your forehead, behind your eyes, and over your cheekbones (sinus pain). ? Chest pain.  You have long-lasting (chronic) lung disease and any of the following: ? Wheezing. ? Long-lasting cough. ? Coughing up blood. ? A change in your usual mucus.  You have a stiff neck.  You have  changes in your: ? Vision. ? Hearing. ? Thinking. ? Mood. This information is not intended to replace advice given to you by your health care provider. Make sure you discuss any questions you have with your health care provider. Document Released: 12/05/2007 Document Revised: 02/19/2016 Document Reviewed: 09/23/2013 Elsevier Interactive Patient Education  2018 Elsevier Inc.  

## 2017-05-30 ENCOUNTER — Encounter: Payer: Self-pay | Admitting: Primary Care

## 2017-05-30 ENCOUNTER — Ambulatory Visit: Payer: Commercial Managed Care - PPO | Admitting: Primary Care

## 2017-05-30 VITALS — BP 130/80 | HR 60 | Temp 97.8°F | Wt 149.8 lb

## 2017-05-30 DIAGNOSIS — R202 Paresthesia of skin: Secondary | ICD-10-CM | POA: Diagnosis not present

## 2017-05-30 DIAGNOSIS — J209 Acute bronchitis, unspecified: Secondary | ICD-10-CM

## 2017-05-30 MED ORDER — NAPROXEN 500 MG PO TABS: 500.0000 mg | ORAL_TABLET | Freq: Two times a day (BID) | ORAL | 0 refills | Status: DC

## 2017-05-30 MED ORDER — AZITHROMYCIN 250 MG PO TABS: ORAL_TABLET | ORAL | 0 refills | Status: DC

## 2017-05-30 NOTE — Patient Instructions (Signed)
Start Azithromycin antibiotics. Take 2 tablets by mouth today, then 1 tablet daily for 4 additional days.  Continue Mucinex.  Start Naproxen tablets. Take 1 tablet by mouth twice daily for 7-10 days with food.  Wear the brace during the day at work and when sleeping.  It was a pleasure to see you today!

## 2017-05-30 NOTE — Progress Notes (Signed)
Subjective:    Patient ID: Maria [name], female    DOB: Mar 22, 1971, 46 y.o.   MRN: [id number]  HPI  Maria [name] is a 46 year old female who presents today with multiple complaints.   1) Cough: She also reports chest congestion, fatigue. Her cough is productive with dark yellow sputum. She denies fevers. Symptoms now present over 2 weeks. She's been taking Alka-Selzer cold plus without much improvement. She started taking Mucinex yesterday and has noticed some improvement in congestion.   She was evaluated on 05/16/17 with a three day history of facial pressure and cough. Her cough was productive with green sputum. She was diagnosed with a viral URI and treated with Mucinex, Ibuprofen, Delsym/Nyquil.   2) Numbness: Located to right upper extremity from her olecranon to her wrist. This is intermittent, better when resting. She thinks she's noticed weakness but is not sure as she lifts heavy bags at work, repetitively. She denies shoulder or neck pain. She's been taking Naproxen twice daily for the past 3-4 days with some improvement.   Evaluated with a complaint of tingling to same location in Summer 2018. Basic work up in Summer 2018 unremarkable, B12 was borderline low so it was recommended she start Vitamin B 12 supplements. We also offered to send her to Neurology for EMG testing but she never responded.   Review of Systems  Constitutional: Positive for fatigue.  HENT: Positive for congestion. Negative for sinus pressure and sore throat.   Respiratory: Positive for cough. Negative for shortness of breath.   Cardiovascular: Negative for chest pain.  Musculoskeletal: Negative for neck pain.       Denies shoulder pain  Neurological: Positive for numbness. Negative for weakness.       Past Medical History:  Diagnosis Date  . Elevated BP without diagnosis of hypertension   . Pancreatitis      Social History   Socioeconomic History  . Marital status: Married    Spouse name: Not on  file  . Number of children: Not on file  . Years of education: Not on file  . Highest education level: Not on file  Social Needs  . Financial resource strain: Not on file  . Food insecurity - worry: Not on file  . Food insecurity - inability: Not on file  . Transportation needs - medical: Not on file  . Transportation needs - non-medical: Not on file  Occupational History  . Not on file  Tobacco Use  . Smoking status: Current Every Day Smoker    Packs/day: 1.00    Types: Cigarettes  . Smokeless tobacco: Never Used  Substance and Sexual Activity  . Alcohol use: Yes    Alcohol/week: 1.2 oz    Types: 2 Glasses of wine per week    Comment: two glasses a day  . Drug use: No  . Sexual activity: Not on file  Other Topics Concern  . Not on file  Social History Narrative   Married.   1 child.    Works at [employer].   Enjoys swimming, camping    No past surgical history on file.  Family History  Problem Relation Age of Onset  . Arthritis Mother   . Arthritis Father   . Alcohol abuse Father   . Hypertension Father   . Heart disease Father   . Aneurysm Father   . Breast cancer Maternal Grandmother   . Breast cancer Paternal Grandmother     Allergies  Allergen Reactions  .  Bupropion Other (See Comments)    Worsened anxiety Other reaction(s): Other (See Comments) Worsened anxiety  . Codeine Swelling  . Other Swelling    Beestings    Current Outpatient Medications on File Prior to Visit  Medication Sig Dispense Refill  . hydrOXYzine (ATARAX/VISTARIL) 25 MG tablet Take 1 tablet (25 mg total) by mouth 3 (three) times daily as needed for itching. 30 tablet 0  . lisinopril (PRINIVIL,ZESTRIL) 10 MG tablet Take 1 tablet (10 mg total) by mouth daily. (Patient not taking: Reported on 05/30/2017) 30 tablet 0   No current facility-administered medications on file prior to visit.     BP 130/80   Pulse 60   Temp 97.8 F (36.6 C) (Oral)   Wt 149 lb 12.8 oz (67.9 kg)   SpO2  98%   BMI 26.75 kg/m    Objective:   Physical Exam  Constitutional: She appears well-nourished.  HENT:  Right Ear: Tympanic membrane and ear canal normal.  Left Ear: Tympanic membrane and ear canal normal.  Nose: Right sinus exhibits no maxillary sinus tenderness and no frontal sinus tenderness. Left sinus exhibits no maxillary sinus tenderness and no frontal sinus tenderness.  Mouth/Throat: Oropharynx is clear and moist.  Eyes: Conjunctivae are normal.  Neck: Neck supple.  Cardiovascular: Normal rate and regular rhythm.  Pulmonary/Chest: Effort normal and breath sounds normal. She has no wheezes. She has no rales.  Musculoskeletal:  Decrease in ROM secondary to discomfort to right wrist with flexion. 5/5 strength to upper extremities.   Lymphadenopathy:    She has no cervical adenopathy.  Neurological: No cranial nerve deficit.  Positive Tinel's sign.   Skin: Skin is warm and dry.          Assessment & Plan:  Acute Bronchitis:  Cough, congestion x 2+ weeks. Exam today overall stable, does appear tired. Given duration of symptoms coupled with tobacco abuse, will treat. Rx for Zpak sent to pharmacy. Continue Mucinex. Follow up PRN.  Sheral Flow, NP

## 2017-05-30 NOTE — Assessment & Plan Note (Signed)
Continues and is intermittent.  Highly suspect carpal tunnel given HPI and exam. Recommended she start wearing her brace. Rx for naproxen course sent to pharmacy. Offered Neuro consult for which she kindly declines. She will update.

## 2017-06-21 ENCOUNTER — Other Ambulatory Visit: Payer: Self-pay | Admitting: Primary Care

## 2017-06-21 DIAGNOSIS — R202 Paresthesia of skin: Secondary | ICD-10-CM

## 2017-07-17 ENCOUNTER — Other Ambulatory Visit: Payer: Self-pay | Admitting: Primary Care

## 2017-07-17 DIAGNOSIS — R202 Paresthesia of skin: Secondary | ICD-10-CM

## 2017-07-17 NOTE — Telephone Encounter (Signed)
Last Rx 06/24/2017. Last OV 05/30/2017

## 2017-07-17 NOTE — Telephone Encounter (Signed)
Copied from [place] [id number]. Topic: Quick Communication - Rx Refill/Question >> Jul 17, 2017  3:53 PM Lolita Rieger, [place] wrote: Medication:naproxen   Has the patient contacted their pharmacy? yes   (Agent: If no, request that the patient contact the pharmacy for the refill.)   Preferred Pharmacy (with phone number or street name):[facility] on [address]   Pearline Cables: Please be advised that RX refills may take up to 3 business days. We ask that you follow-up with your pharmacy.

## 2017-07-17 NOTE — Telephone Encounter (Signed)
Pt. Requesting a refill on Naproxen; last office visit 05/30/17; this was filled on 11//29/18 and 06/24/17; unsure if this should be refilled at this dose.

## 2017-07-18 NOTE — Telephone Encounter (Signed)
Please ask patient if she is taking this naproxen twice daily, every day for her arm pain.  This prescription was for short-term use.  Has she tried anything else over-the-counter for her pain?

## 2017-07-19 MED ORDER — NAPROXEN 500 MG PO TABS: 500.0000 mg | ORAL_TABLET | Freq: Two times a day (BID) | ORAL | 0 refills | Status: DC | PRN

## 2017-07-19 NOTE — Telephone Encounter (Signed)
Please notify patient that chronic use of this medication can cause kidney damage so I recommend she use it sparingly. Please have her notify me if she continues to experience pain after this next round of Naproxen.

## 2017-07-19 NOTE — Telephone Encounter (Signed)
Spoken to patient. She stated that she has pain in the arm, yes she would take naproxen twice a day.   Patient stated that she has been taking OTC medication. Right now, she is taking two tablet of the 250 mg BID.  She stated she would like a refill because this Rx last longer than OTC.

## 2017-07-22 NOTE — Telephone Encounter (Signed)
Left message on voicemail for patient to call back. 

## 2017-07-24 NOTE — Telephone Encounter (Signed)
Message left for patient to return my call.  

## 2017-07-24 NOTE — Telephone Encounter (Signed)
Pt made aware of message from [facility]. Will call if still having pain after this round of Naproxen.

## 2017-09-26 ENCOUNTER — Encounter: Payer: Self-pay | Admitting: Family Medicine

## 2017-09-26 ENCOUNTER — Ambulatory Visit: Payer: Commercial Managed Care - PPO | Admitting: Family Medicine

## 2017-09-26 DIAGNOSIS — K529 Noninfective gastroenteritis and colitis, unspecified: Secondary | ICD-10-CM | POA: Insufficient documentation

## 2017-09-26 NOTE — Assessment & Plan Note (Signed)
Clinically improved.  Okay for outpatient f/u.  Supportive care.  See AVS.  Out of work given her sx prev.  D/w pt about tongue changes and smoking cessation.  No ulceration.  If not healed over then she'll need recheck.  She agrees.

## 2017-09-26 NOTE — Patient Instructions (Signed)
Rest and fluids.  Update [place] as needed.  Recheck if your tongue doesn't heal over.   Take care.  Glad to see you.  Out of work for now.

## 2017-09-26 NOTE — Progress Notes (Signed)
duration of symptoms: 09/23/17 First noted some nausea/upset stomach.  She had typical foods, no atypical foods.  Then started vomiting at work on 09/23/17.   Similar sx on 09/24/17, with addition of diarrhea.   Yesterday with some improvement, but fatigued.  She feels better today.  Can keep food down now.  No diarrhea.   No documented fevers but felt hot about 2 days ago, with sweats.   Mult sick exposures with work.   She had a flu shot.   She had some leg aches but she is usually on her feet a lot at work.  She didn't have diffuse aches o/w.  No blood in stool or vomit.  Some rhinorrhea.  No ear pain.  She has some chest congestion but clearly feels better today.  Fatigue is better.    Per HPI unless specifically indicated in ROS section   Meds, vitals, and allergies reviewed.   GEN: nad, alert and oriented HEENT: mucous membranes moist, TM w/o erythema, nasal epithelium minimally injected, OP wnl except for  likely geogpraphic tongue noted- d/w pt.   No ulceration.   NECK: supple w/o LA CV: rrr. PULM: ctab, no inc wob ABD: soft, +bs EXT: no edema

## 2017-10-09 ENCOUNTER — Ambulatory Visit: Payer: Commercial Managed Care - PPO | Admitting: Family Medicine

## 2017-10-09 ENCOUNTER — Encounter: Payer: Self-pay | Admitting: Family Medicine

## 2017-10-09 ENCOUNTER — Ambulatory Visit (INDEPENDENT_AMBULATORY_CARE_PROVIDER_SITE_OTHER)
Admission: RE | Admit: 2017-10-09 | Discharge: 2017-10-09 | Disposition: A | Discharge status: Home / Self Care | Payer: Commercial Managed Care - PPO | Source: Ambulatory Visit | Attending: Family Medicine | Admitting: Family Medicine

## 2017-10-09 VITALS — BP 140/72 | HR 53 | Temp 98.0°F | Wt 146.8 lb

## 2017-10-09 DIAGNOSIS — M533 Sacrococcygeal disorders, not elsewhere classified: Secondary | ICD-10-CM

## 2017-10-09 DIAGNOSIS — W19XXXA Unspecified fall, initial encounter: Secondary | ICD-10-CM | POA: Diagnosis not present

## 2017-10-09 NOTE — Progress Notes (Signed)
   Subjective:    Patient ID: Maria [name], female    DOB: 1970/11/29, 47 y.o.   MRN: [id number]  HPI This is a 47 yo female who presents today following a fall that happened yesterday. She slid on her wet deck and her legs splayed and she hit her tailbone. Tender following fall, worsening pain as day went on. Can't sit on tailbone. Has taken naproxyn 2 tabs x 1 and acetaminophen x 1 with a little easing of pain. No numbness, no tingling, no change in bowels or bladder.  She works in Barrister's clerk at [employer], difficult to bend and lift.   LMP 6 months ago, no recent sexual activity.  Past Medical History:  Diagnosis Date  . Elevated BP without diagnosis of hypertension   . Pancreatitis    No past surgical history on file. Family History  Problem Relation Age of Onset  . Arthritis Mother   . Arthritis Father   . Alcohol abuse Father   . Hypertension Father   . Heart disease Father   . Aneurysm Father   . Breast cancer Maternal Grandmother   . Breast cancer Paternal Grandmother    Social History   Tobacco Use  . Smoking status: Current Every Day Smoker    Packs/day: 1.00    Types: Cigarettes  . Smokeless tobacco: Never Used  Substance Use Topics  . Alcohol use: Yes    Alcohol/week: 1.2 oz    Types: 2 Glasses of wine per week    Comment: two glasses a day  . Drug use: No       Review of Systems Per HPI    Objective:   Physical Exam  Constitutional: She is oriented to person, place, and time. She appears well-developed and well-nourished. No distress.  HENT:  Head: Normocephalic and atraumatic.  Eyes: Conjunctivae are normal.  Pulmonary/Chest: Effort normal.  Musculoskeletal:       Back:  Neurological: She is alert and oriented to person, place, and time.  Skin: Skin is warm and dry. She is not diaphoretic.  Psychiatric: She has a normal mood and affect. Her behavior is normal. Judgment and thought content normal.  Vitals reviewed.        BP  140/72   Pulse (!) 53   Temp 98 F (36.7 C) (Oral)   Wt 146 lb 12 oz (66.6 kg)   SpO2 97%   BMI 26.20 kg/m   Assessment & Plan:  1. Fall, initial encounter - DG Sacrum/Coccyx; Future  2. Sacral pain - provided written and verbal instructions for otc analgesics - RTC instructions provided - DG Sacrum/Coccyx; Future   Clarene Reamer, FNP-BC  [facility]  10/09/2017 2:59 PM

## 2017-10-09 NOTE — Patient Instructions (Signed)
Can take naproxyn 2 tablets every 12 hours or ibuprofen 600 mg every 8 hours.   I will notify you of your xray results

## 2017-10-10 ENCOUNTER — Ambulatory Visit: Payer: Commercial Managed Care - PPO | Admitting: Primary Care

## 2017-10-10 ENCOUNTER — Telehealth: Payer: Self-pay | Admitting: *Deleted

## 2017-10-10 NOTE — Telephone Encounter (Signed)
Copied from [place]. Topic: General - Other >> Oct 09, 2017  4:18 PM Clack, Laban Emperor wrote: Reason for CRM: Pt states she needs a work note for today's visit.  Please contact pt ready letter is ready for pick up.

## 2017-10-10 NOTE — Telephone Encounter (Signed)
Pt was seen 4/10 for fall and is requesting a work note. Pls advise on when pt is able to return

## 2017-10-11 NOTE — Telephone Encounter (Signed)
I am happy to provide a note, please call patient and see how she is doing and when she will be able to return to work.

## 2017-10-11 NOTE — Telephone Encounter (Signed)
Attempted to contact pt; unable to leave message, vm ful. Pls confirm when pt is able to return to work, should she return call,so that work note can be written

## 2017-10-16 NOTE — Telephone Encounter (Signed)
Attempted to contact patient. Unable to leave voicemail, mail box is full. Will attempted again at another time.

## 2017-11-12 ENCOUNTER — Encounter: Payer: Self-pay | Admitting: Family Medicine

## 2017-11-12 ENCOUNTER — Ambulatory Visit: Payer: Commercial Managed Care - PPO | Admitting: Family Medicine

## 2017-11-12 VITALS — BP 128/64 | HR 60 | Temp 98.5°F | Ht 62.75 in | Wt 144.5 lb

## 2017-11-12 DIAGNOSIS — R202 Paresthesia of skin: Secondary | ICD-10-CM

## 2017-11-12 LAB — COMPREHENSIVE METABOLIC PANEL
ALT: 12 U/L (ref 0–35)
AST: 22 U/L (ref 0–37)
Albumin: 4.2 g/dL (ref 3.5–5.2)
Alkaline Phosphatase: 87 U/L (ref 39–117)
BUN: 12 mg/dL (ref 6–23)
CALCIUM: 9.5 mg/dL (ref 8.4–10.5)
CHLORIDE: 107 meq/L (ref 96–112)
CO2: 24 meq/L (ref 19–32)
Creatinine, Ser: 0.57 mg/dL (ref 0.40–1.20)
GFR: 120.75 mL/min (ref >60.00)
Glucose, Bld: 96 mg/dL (ref 70–99)
Potassium: 4.4 mEq/L (ref 3.5–5.1)
Sodium: 142 mEq/L (ref 135–145)
Total Bilirubin: 0.9 mg/dL (ref 0.2–1.2)
Total Protein: 7.4 g/dL (ref 6.0–8.3)

## 2017-11-12 LAB — CBC WITH DIFFERENTIAL/PLATELET
BASOS PCT: 0.5 % (ref 0.0–3.0)
Basophils Absolute: 0 10*3/uL (ref 0.0–0.1)
Eosinophils Absolute: 0.1 10*3/uL (ref 0.0–0.7)
Eosinophils Relative: 1.4 % (ref 0.0–5.0)
HEMATOCRIT: 42 % (ref 36.0–46.0)
Hemoglobin: 14.9 g/dL (ref 12.0–15.0)
Lymphocytes Relative: 33.5 % (ref 12.0–46.0)
Lymphs Abs: 2.3 10*3/uL (ref 0.7–4.0)
MCHC: 35.5 g/dL (ref 30.0–36.0)
Monocytes Absolute: 0.4 10*3/uL (ref 0.1–1.0)
Monocytes Relative: 6.4 % (ref 3.0–12.0)
NEUTROS ABS: 3.9 10*3/uL (ref 1.4–7.7)
Neutrophils Relative %: 58.2 % (ref 43.0–77.0)
PLATELETS: 166 10*3/uL (ref 150.0–400.0)
RBC: 3.79 Mil/uL — ABNORMAL LOW (ref 3.87–5.11)
RDW: 14.2 % (ref 11.5–15.5)
WBC: 6.8 10*3/uL (ref 4.0–10.5)

## 2017-11-12 LAB — TSH: TSH: 1.26 u[IU]/mL (ref 0.35–4.50)

## 2017-11-12 LAB — VITAMIN B12: Vitamin B-12: 252 pg/mL (ref 211–911)

## 2017-11-12 NOTE — Progress Notes (Signed)
Episodic R hand and B foot numbness for the last ~2-3 months.  Can last for a variable amount of time, naproxen helps.  No L hand sx.  No weakness. No foot drop.  No speech changes. "it feels like it's asleep and tingly."  It can radiate up to the R elbow.  It can some up to the ankle but not the heel and not the shin. Can happen in the day and/or night.  Sx are either getting worse or she is paying more attention to it.   Meds, vitals, and allergies reviewed.   ROS: Per HPI unless specifically indicated in ROS section   GEN: nad, alert and oriented HEENT: mucous membranes moist NECK: supple w/o LA CV: rrr. PULM: ctab, no inc wob ABD: soft, +bs EXT: no edema SKIN: no acute rash CN 2-12 wnl B, S/S/DTR wnl x4, except for decreased (but not absent) monofilament testing on the right foot and right wrist Tinel testing is positive. Normal gip, normal gait.

## 2017-11-12 NOTE — Patient Instructions (Signed)
Possible carpal tunnel symptoms.  Restart using a brace at night and/or during the day.  See if that helps.  Possibly a different cause in the feet.  Go to the lab on the way out.  We'll contact you with your lab report. Take care.  Glad to see you.

## 2017-11-13 DIAGNOSIS — G5601 Carpal tunnel syndrome, right upper limb: Secondary | ICD-10-CM | POA: Insufficient documentation

## 2017-11-13 DIAGNOSIS — R202 Paresthesia of skin: Secondary | ICD-10-CM | POA: Insufficient documentation

## 2017-11-13 DIAGNOSIS — G5603 Carpal tunnel syndrome, bilateral upper limbs: Secondary | ICD-10-CM | POA: Insufficient documentation

## 2017-11-13 NOTE — Assessment & Plan Note (Signed)
The patient may have multiple issues.  She likely has carpal tunnel symptoms in the right hand.  The question is she has some other form of peripheral neuropathy it is also affecting the bilateral feet.  She does not have strokelike symptoms.  Okay for outpatient follow-up.  Reasonable to check routine labs related to paresthesias today.  Reasonable to start splinting the right wrist at night and/or during the day.  Rationale discussed with patient.  She agrees.  Update me as needed.

## 2017-11-14 ENCOUNTER — Other Ambulatory Visit: Payer: Self-pay | Admitting: Family Medicine

## 2017-11-14 DIAGNOSIS — R7989 Other specified abnormal findings of blood chemistry: Secondary | ICD-10-CM

## 2017-11-19 ENCOUNTER — Other Ambulatory Visit: Payer: Commercial Managed Care - PPO

## 2017-11-20 ENCOUNTER — Other Ambulatory Visit (INDEPENDENT_AMBULATORY_CARE_PROVIDER_SITE_OTHER): Payer: Commercial Managed Care - PPO

## 2017-11-20 ENCOUNTER — Telehealth: Payer: Self-pay | Admitting: Primary Care

## 2017-11-20 DIAGNOSIS — R7989 Other specified abnormal findings of blood chemistry: Secondary | ICD-10-CM

## 2017-11-20 DIAGNOSIS — R202 Paresthesia of skin: Secondary | ICD-10-CM

## 2017-11-20 LAB — FOLATE: Folate: 2.6 ng/mL — ABNORMAL LOW (ref >5.9)

## 2017-11-20 MED ORDER — NAPROXEN 500 MG PO TABS: 500.0000 mg | ORAL_TABLET | Freq: Two times a day (BID) | ORAL | 0 refills | Status: DC | PRN

## 2017-11-20 NOTE — Telephone Encounter (Signed)
Copied from [place] [id number]. Topic: Quick Communication - Rx Refill/Question >> Nov 20, 2017 12:40 PM Bea Graff, NT wrote: Medication: naproxen (NAPROSYN) 500 MG tablet   Has the patient contacted their pharmacy? Yes.   (Agent: If no, request that the patient contact the pharmacy for the refill.) (Agent: If yes, when and what did the pharmacy advise?)  Preferred Pharmacy (with phone number or street name): [id number] [address], [place] [phone] (Phone) [phone] (Fax)      Agent: Please be advised that RX refills may take up to 3 business days. We ask that you follow-up with your pharmacy.

## 2017-11-23 LAB — METHYLMALONIC ACID, SERUM: Methylmalonic Acid, Quant: 129 nmol/L (ref 87–318)

## 2017-11-23 LAB — HOMOCYSTEINE: Homocysteine: 75.5 umol/L — ABNORMAL HIGH (ref <10.4)

## 2017-11-26 ENCOUNTER — Other Ambulatory Visit: Payer: Self-pay | Admitting: Family Medicine

## 2017-11-26 DIAGNOSIS — R7989 Other specified abnormal findings of blood chemistry: Secondary | ICD-10-CM

## 2017-11-26 DIAGNOSIS — E538 Deficiency of other specified B group vitamins: Secondary | ICD-10-CM

## 2017-11-26 MED ORDER — VITAMIN B-12 1000 MCG PO TABS: 1000.0000 ug | ORAL_TABLET | Freq: Every day | ORAL | 1 refills | Status: DC

## 2017-11-26 MED ORDER — FOLIC ACID 1 MG PO TABS: 1.0000 mg | ORAL_TABLET | Freq: Every day | ORAL | 1 refills | Status: DC

## 2017-12-09 ENCOUNTER — Ambulatory Visit: Payer: Commercial Managed Care - PPO | Admitting: Primary Care

## 2017-12-09 ENCOUNTER — Encounter: Payer: Self-pay | Admitting: Primary Care

## 2017-12-09 VITALS — BP 130/72 | HR 62 | Temp 97.8°F | Ht 62.75 in | Wt 145.5 lb

## 2017-12-09 DIAGNOSIS — J069 Acute upper respiratory infection, unspecified: Secondary | ICD-10-CM | POA: Diagnosis not present

## 2017-12-09 NOTE — Progress Notes (Signed)
Subjective:    Patient ID: Maria [name], female    DOB: 04-10-1971, 47 y.o.   MRN: [id number]  HPI  Ms. Bejar is a 47 year old female who presents today with a chief complaint of nasal congestion.  She also reports sinus pressure, dry cough,fatigue, chest congestion. Her symptoms began 3 days ago. She's taken Alka-Seltzer Day, naproxen, tylenol with temporary improvement. She denies fevers. She works at [employer] and may have been exposed to someone sick there. Her husband had similar symptoms last week.   Review of Systems  Constitutional: Positive for fatigue. Negative for chills and fever.  HENT: Positive for congestion and sinus pressure. Negative for ear pain.   Respiratory: Positive for cough. Negative for shortness of breath and wheezing.        Past Medical History:  Diagnosis Date  . Elevated BP without diagnosis of hypertension   . Pancreatitis      Social History   Socioeconomic History  . Marital status: Married    Spouse name: Not on file  . Number of children: Not on file  . Years of education: Not on file  . Highest education level: Not on file  Occupational History  . Not on file  Social Needs  . Financial resource strain: Not on file  . Food insecurity:    Worry: Not on file    Inability: Not on file  . Transportation needs:    Medical: Not on file    Non-medical: Not on file  Tobacco Use  . Smoking status: Current Every Day Smoker    Packs/day: 1.00    Types: Cigarettes  . Smokeless tobacco: Never Used  Substance and Sexual Activity  . Alcohol use: Yes    Alcohol/week: 1.2 oz    Types: 2 Glasses of wine per week    Comment: two glasses a day  . Drug use: No  . Sexual activity: Not on file  Lifestyle  . Physical activity:    Days per week: Not on file    Minutes per session: Not on file  . Stress: Not on file  Relationships  . Social connections:    Talks on phone: Not on file    Gets together: Not on file   Attends religious service: Not on file    Active member of club or organization: Not on file    Attends meetings of clubs or organizations: Not on file    Relationship status: Not on file  . Intimate partner violence:    Fear of current or ex partner: Not on file    Emotionally abused: Not on file    Physically abused: Not on file    Forced sexual activity: Not on file  Other Topics Concern  . Not on file  Social History Narrative   Married.   1 child.    Works at [employer].   Enjoys swimming, camping    No past surgical history on file.  Family History  Problem Relation Age of Onset  . Arthritis Mother   . Arthritis Father   . Alcohol abuse Father   . Hypertension Father   . Heart disease Father   . Aneurysm Father   . Breast cancer Maternal Grandmother   . Breast cancer Paternal Grandmother     Allergies  Allergen Reactions  . Bupropion Other (See Comments)    Worsened anxiety Other reaction(s): Other (See Comments) Worsened anxiety  . Codeine Swelling  . Other Swelling  Beestings    Current Outpatient Medications on File Prior to Visit  Medication Sig Dispense Refill  . folic acid (FOLVITE) 1 MG tablet Take 1 tablet (1 mg total) by mouth daily. 90 tablet 1  . naproxen (NAPROSYN) 500 MG tablet Take 1 tablet (500 mg total) by mouth 2 (two) times daily as needed for moderate pain. 60 tablet 0  . vitamin B-12 (CYANOCOBALAMIN) 1000 MCG tablet Take 1 tablet (1,000 mcg total) by mouth daily. 90 tablet 1   No current facility-administered medications on file prior to visit.     BP 130/72   Pulse 62   Temp 97.8 F (36.6 C) (Oral)   Ht 5' 2.75" (1.594 m)   Wt 145 lb 8 oz (66 kg)   SpO2 97%   BMI 25.98 kg/m    Objective:   Physical Exam  Constitutional: She appears well-nourished. She does not appear ill.  HENT:  Right Ear: Tympanic membrane and ear canal normal.  Left Ear: Tympanic membrane and ear canal normal.  Nose: Mucosal edema present. Right  sinus exhibits maxillary sinus tenderness. Right sinus exhibits no frontal sinus tenderness. Left sinus exhibits maxillary sinus tenderness. Left sinus exhibits no frontal sinus tenderness.  Mouth/Throat: Oropharynx is clear and moist.  Neck: Neck supple.  Cardiovascular: Normal rate and regular rhythm.  Respiratory: Effort normal and breath sounds normal. She has no wheezes.  Skin: Skin is warm and dry.           Assessment & Plan:  URI;  Dry cough, sinus pressure, fatigue, nasal congestion x 3 days. Exam today consistent for viral involvement, clear lungs. Discussed use of Flonase, Delsym/Robitussin, fluids, rest. Return precautions provided.  [name], NP

## 2017-12-09 NOTE — Patient Instructions (Signed)
Your symptoms are representative of a viral illness which will resolve on its own over time. Our goal is to treat your symptoms in order to aid your body in the healing process and to make you more comfortable.   Nasal Congestion/Ear Pressure: Try using Flonase (fluticasone) nasal spray. Instill 1 spray in each nostril twice daily.   You can try Delsym DM or Robitussin DM if your cough progresses.   Make sure to drink plenty of fluids to thin mucous and stay hydrated.  Please call if you develop fevers that are persistently at or above 101. Please call me in one week if your symptoms progress and/or if you are no better.  It was a pleasure to see you today!

## 2017-12-10 ENCOUNTER — Telehealth: Payer: Self-pay | Admitting: Primary Care

## 2017-12-10 ENCOUNTER — Encounter: Payer: Self-pay | Admitting: Primary Care

## 2017-12-10 NOTE — Telephone Encounter (Signed)
Noted, work note printed and placed in [employer].

## 2017-12-10 NOTE — Telephone Encounter (Signed)
Patient came by and was given the work note.

## 2017-12-10 NOTE — Telephone Encounter (Signed)
Copied from [place] [id number]. Topic: Quick Communication - See Telephone Encounter >> Dec 10, 2017 10:00 AM Bea Graff, NT wrote: CRM for notification. See Telephone encounter for: 12/10/17. Pt needs a note from the doctor that states she was out of work for yesterday and today and able to return to work tomorrow. Please let pt know when ready for pick up.

## 2017-12-23 ENCOUNTER — Other Ambulatory Visit: Payer: Self-pay | Admitting: Primary Care

## 2017-12-23 DIAGNOSIS — R202 Paresthesia of skin: Secondary | ICD-10-CM

## 2018-01-17 ENCOUNTER — Encounter: Payer: Self-pay | Admitting: Family Medicine

## 2018-01-17 ENCOUNTER — Ambulatory Visit: Payer: Commercial Managed Care - PPO | Admitting: Family Medicine

## 2018-01-17 VITALS — BP 136/78 | HR 68 | Temp 98.0°F | Ht 62.75 in | Wt 144.0 lb

## 2018-01-17 DIAGNOSIS — E538 Deficiency of other specified B group vitamins: Secondary | ICD-10-CM

## 2018-01-17 DIAGNOSIS — G6289 Other specified polyneuropathies: Secondary | ICD-10-CM

## 2018-01-17 HISTORY — DX: Deficiency of other specified B group vitamins: E53.8

## 2018-01-17 LAB — VITAMIN B12: Vitamin B-12: 865 pg/mL (ref 211–911)

## 2018-01-17 MED ORDER — CYANOCOBALAMIN 1000 MCG/ML IJ SOLN
1000.0000 ug | Freq: Once | INTRAMUSCULAR | Status: AC
  Administered 2018-01-17: 1000 ug via INTRAMUSCULAR

## 2018-01-17 MED ORDER — VITAMIN B12 500 MCG PO TABS: 500.0000 ug | ORAL_TABLET | Freq: Every day | ORAL | Status: DC

## 2018-01-17 NOTE — Assessment & Plan Note (Addendum)
Ongoing over months, to bilateral feet and R hand in stocking/glove distribution. No h/o diabetes, no significant h/o alcohol use. Low normal vit B12 levels with elevated homocysteine, low folate. Check SPEP today and check IF.  Symptoms could be related to vit B12 deficiency, discussed possible residual symptoms despite appropriate treatment.

## 2018-01-17 NOTE — Patient Instructions (Addendum)
Labs today then B12 shot.  Many of these symptoms can be attributable to neurologic changes from b12 deficiency Continue oral B12 and we will decide if we need shots instead.  Continue folate.   [facility] [place] [phone]

## 2018-01-17 NOTE — Assessment & Plan Note (Addendum)
With macrocytosis, without anemia. She has been taking oral B12 intermittently for a few months, more regularly started this past week. Will update level, along with intrinsic factor. Will give B12 shot today after labs, continue oral replacement, consider continued IM supplementation given neurological symptoms.

## 2018-01-17 NOTE — Progress Notes (Signed)
BP 136/78 (BP Location: Left Arm, Patient Position: Sitting, Cuff Size: Normal)   Pulse 68   Temp 98 F (36.7 C) (Oral)   Ht 5' 2.75" (1.594 m)   Wt 144 lb (65.3 kg)   SpO2 96%   BMI 25.71 kg/m    CC: "I'm numb" Subjective:    Patient ID: Maria [name], female    DOB: 30-Sep-1970, 47 y.o.   MRN: [id number]  HPI: Maria [name] is a 47 y.o. female presenting on 01/17/2018 for Numbness (C/o numbness in right hand and bilateral lower LEs starting mid shin to the feet.  Started about 2 wks ago. Numbess is intermittent but occurs every morning . )   Several month history of numbness of feet and R hand. L hand not affected. Numbness starts at toes and travels up to mid calf. R hand numbness starts at fingers and travels to mid forearm.  Not as bothersome during the day - active and doesn't think about it.  Trouble sleeping at night due to this.   Some mid thoracic back pain - attributed to new mattress.   Denies fevers/chills, neck pain, lower back pain, burning pain, new rashes, joint pains.   On her feet all day, physical work - works in Medical sales representative at [employer].  She started taking B12 and folate vitamins - this may have helped some.  She has tried bracing right wrist.  Poor eating habits.   No diabetes history.  Alcohol - 1 glass wine or 1 beer a day.   Recent labs reviewed - macrocytosis without anemia (MCV 110),   Relevant past medical, surgical, family and social history reviewed and updated as indicated. Interim medical history since our last visit reviewed. Allergies and medications reviewed and updated. Outpatient Medications Prior to Visit  Medication Sig Dispense Refill  . folic acid (FOLVITE) 1 MG tablet Take 1 tablet (1 mg total) by mouth daily. 90 tablet 1  . naproxen (NAPROSYN) 500 MG tablet Take 1 tablet (500 mg total) by mouth 2 (two) times daily as needed for moderate pain. 60 tablet 0  . vitamin B-12 (CYANOCOBALAMIN) 1000 MCG tablet Take 1 tablet (1,000 mcg total)  by mouth daily.    . vitamin B-12 (CYANOCOBALAMIN) 1000 MCG tablet Take 1 tablet (1,000 mcg total) by mouth daily. 90 tablet 1   No facility-administered medications prior to visit.      Per HPI unless specifically indicated in ROS section below Review of Systems     Objective:    BP 136/78 (BP Location: Left Arm, Patient Position: Sitting, Cuff Size: Normal)   Pulse 68   Temp 98 F (36.7 C) (Oral)   Ht 5' 2.75" (1.594 m)   Wt 144 lb (65.3 kg)   SpO2 96%   BMI 25.71 kg/m   Wt Readings from Last 3 Encounters:  01/17/18 144 lb (65.3 kg)  12/09/17 145 lb 8 oz (66 kg)  11/12/17 144 lb 8 oz (65.5 kg)    Physical Exam  Constitutional: She appears well-developed and well-nourished. No distress.  HENT:  Mouth/Throat: Oropharynx is clear and moist. No oropharyngeal exudate.  Eyes: Pupils are equal, round, and reactive to light. Right eye exhibits nystagmus. Left eye exhibits nystagmus.  Mild nystagmus lateral end gaze bilaterally  Neck: Normal range of motion. Neck supple. No thyromegaly present.  Cardiovascular: Normal rate, regular rhythm and normal heart sounds.  No murmur heard. Pulmonary/Chest: Effort normal and breath sounds normal. No respiratory distress. She has no wheezes. She has  no rales.  Musculoskeletal: Normal range of motion. She exhibits no edema.  Lymphadenopathy:    She has no cervical adenopathy.  Neurological: She is alert. She has normal strength. No cranial nerve deficit. She displays a negative Romberg sign. Coordination and gait normal.  CN 2-12 intact FTN intact EOMI 5/5 strength BUE and BLE, grip strength intact No pronator drift Sensation diminished to light touch, diminished to monofilament testing, temperature, dull/sharp and 2 point discrimination at R hand and bilateral feet   Skin: Skin is warm and dry. No rash noted.  Psychiatric: She has a normal mood and affect.  Nursing note and vitals reviewed.  Results for orders placed or performed in  visit on 11/20/17  Homocysteine  Result Value Ref Range   Homocysteine 75.5 (H) <10.4 umol/L  Methylmalonic Acid, Serum  Result Value Ref Range   Methylmalonic Acid, Quant 129 87 - 318 nmol/L  Folate  Result Value Ref Range   Folate 2.6 (L) >5.9 ng/mL   Lab Results  Component Value Date   WBC 6.8 11/12/2017   HGB 14.9 11/12/2017   HCT 42.0 11/12/2017   MCV 110.8 Repeated and verified X2. (H) 11/12/2017   PLT 166.0 11/12/2017       Assessment & Plan:  Requests OBGYN referral as she would like to establish for well woman exam. # provided for local Center for [facility].  Problem List Items Addressed This Visit    Vitamin B12 deficiency    With macrocytosis, without anemia. She has been taking oral B12 intermittently for a few months, more regularly started this past week. Will update level, along with intrinsic factor. Will give B12 shot today after labs, continue oral replacement, consider continued IM supplementation given neurological symptoms.      Relevant Medications   cyanocobalamin ((VITAMIN B-12)) injection 1,000 mcg (Completed)   Other Relevant Orders   Vitamin B12   Intrinsic Factor Antibodies   Peripheral neuropathy - Primary    Ongoing over months, to bilateral feet and R hand in stocking/glove distribution. No h/o diabetes, no significant h/o alcohol use. Low normal vit B12 levels with elevated homocysteine, low folate. Check SPEP today and check IF.  Symptoms could be related to vit B12 deficiency, discussed possible residual symptoms despite appropriate treatment.       Relevant Orders   Vitamin B12   Intrinsic Factor Antibodies   Serum protein electrophoresis with reflex       Meds ordered this encounter  Medications  . DISCONTD: Cyanocobalamin (VITAMIN B-12) 500 MCG TABS    Sig: Take 500 mcg by mouth daily.  . cyanocobalamin ((VITAMIN B-12)) injection 1,000 mcg   Orders Placed This Encounter  Procedures  . Vitamin B12  . Intrinsic Factor  Antibodies  . Serum protein electrophoresis with reflex    Follow up plan: No follow-ups on file.  [name], MD

## 2018-01-22 LAB — PROTEIN ELECTROPHORESIS, SERUM, WITH REFLEX
ALPHA 2: 0.6 g/dL (ref 0.5–0.9)
Albumin ELP: 4.4 g/dL (ref 3.8–4.8)
Alpha 1: 0.3 g/dL (ref 0.2–0.3)
Beta 2: 0.4 g/dL (ref 0.2–0.5)
Beta Globulin: 0.5 g/dL (ref 0.4–0.6)
GAMMA GLOBULIN: 1 g/dL (ref 0.8–1.7)
TOTAL PROTEIN: 7.2 g/dL (ref 6.1–8.1)

## 2018-01-22 LAB — INTRINSIC FACTOR ANTIBODIES: INTRINSIC FACTOR: NEGATIVE

## 2018-01-22 LAB — IFE INTERPRETATION

## 2018-01-23 ENCOUNTER — Other Ambulatory Visit: Payer: Self-pay | Admitting: Family Medicine

## 2018-01-23 DIAGNOSIS — R778 Other specified abnormalities of plasma proteins: Secondary | ICD-10-CM

## 2018-02-14 ENCOUNTER — Encounter: Payer: Self-pay | Admitting: Obstetrics & Gynecology

## 2018-02-17 ENCOUNTER — Encounter: Payer: Self-pay | Admitting: Obstetrics & Gynecology

## 2018-02-17 ENCOUNTER — Encounter: Payer: Self-pay | Admitting: Obstetrics and Gynecology

## 2018-02-17 ENCOUNTER — Ambulatory Visit (INDEPENDENT_AMBULATORY_CARE_PROVIDER_SITE_OTHER): Payer: Commercial Managed Care - PPO | Admitting: Obstetrics & Gynecology

## 2018-02-17 VITALS — BP 140/80 | Ht 62.0 in | Wt 146.0 lb

## 2018-02-17 DIAGNOSIS — N814 Uterovaginal prolapse, unspecified: Secondary | ICD-10-CM

## 2018-02-17 DIAGNOSIS — Z1231 Encounter for screening mammogram for malignant neoplasm of breast: Secondary | ICD-10-CM | POA: Diagnosis not present

## 2018-02-17 DIAGNOSIS — Z1239 Encounter for other screening for malignant neoplasm of breast: Secondary | ICD-10-CM

## 2018-02-17 DIAGNOSIS — Z0000 Encounter for general adult medical examination without abnormal findings: Secondary | ICD-10-CM

## 2018-02-17 NOTE — Patient Instructions (Signed)
PAP every three years - next week Mammogram every year    Call [phone] to schedule at [facility] yearly (next week, FASTING)

## 2018-02-17 NOTE — Progress Notes (Signed)
HPI:      [name] is a 47 y.o. [phone] who LMP was Patient's last menstrual period was 02/16/2018.,she presents today for her annual examination. Periods irreg but heavy when she does have them (every 3-4 mos, for last 2 years).  Also c/o vaginal pressure and difficulty w sex due to cervical/uterine prolapse, for the past 2 years.   The patient is sexually active. Her last pap: approximate date years ago and was normal and last mammogram: approximate date years ago and was normal. The patient does perform self breast exams.  There is no notable family history of breast or ovarian cancer in her family.  The patient has regular exercise: yes.  The patient denies current symptoms of depression.    GYN History: Contraception: none  PMHx: Past Medical History:  Diagnosis Date  . Elevated BP without diagnosis of hypertension   . Pancreatitis    History reviewed. No pertinent surgical history. Family History  Problem Relation Age of Onset  . Arthritis Mother   . Arthritis Father   . Alcohol abuse Father   . Hypertension Father   . Heart disease Father   . Aneurysm Father   . Breast cancer Maternal Grandmother   . Breast cancer Paternal Grandmother    Social History   Tobacco Use  . Smoking status: Current Every Day Smoker    Packs/day: 1.00    Types: Cigarettes  . Smokeless tobacco: Never Used  Substance Use Topics  . Alcohol use: Yes    Alcohol/week: 2.0 standard drinks    Types: 2 Glasses of wine per week    Comment: two glasses a day  . Drug use: No    Current Outpatient Medications:  .  folic acid (FOLVITE) 1 MG tablet, Take 1 tablet (1 mg total) by mouth daily., Disp: 90 tablet, Rfl: 1 .  naproxen (NAPROSYN) 500 MG tablet, Take 1 tablet (500 mg total) by mouth 2 (two) times daily as needed for moderate pain., Disp: 60 tablet, Rfl: 0 .  vitamin B-12 (CYANOCOBALAMIN) 1000 MCG tablet, Take 1 tablet (1,000 mcg total) by mouth daily., Disp: , Rfl:  Allergies:  Bupropion; Codeine; and Other  Review of Systems  Constitutional: Positive for malaise/fatigue. Negative for chills and fever.  HENT: Negative for congestion, sinus pain and sore throat.   Eyes: Negative for blurred vision and pain.  Respiratory: Negative for cough and wheezing.   Cardiovascular: Negative for chest pain and leg swelling.  Gastrointestinal: Positive for nausea. Negative for abdominal pain, constipation, diarrhea, heartburn and vomiting.  Genitourinary: Negative for dysuria, frequency, hematuria and urgency.  Musculoskeletal: Negative for back pain, joint pain, myalgias and neck pain.  Skin: Negative for itching and rash.  Neurological: Negative for dizziness, tremors and weakness.  Endo/Heme/Allergies: Bruises/bleeds easily.  Psychiatric/Behavioral: Negative for depression. The patient is not nervous/anxious and does not have insomnia.     Objective: BP 140/80   Ht 5\' 2"  (1.575 m)   Wt 146 lb (66.2 kg)   LMP 02/16/2018   BMI 26.70 kg/m   Filed Weights   02/17/18 0830  Weight: 146 lb (66.2 kg)   Body mass index is 26.7 kg/m. Physical Exam  Constitutional: She is oriented to person, place, and time. She appears well-developed and well-nourished. No distress.  HENT:  Head: Normocephalic and atraumatic. Head is without laceration.  Right Ear: Hearing normal.  Left Ear: Hearing normal.  Nose: No epistaxis.  No foreign bodies.  Mouth/Throat: Uvula is midline, oropharynx is  clear and moist and mucous membranes are normal.  Eyes: Pupils are equal, round, and reactive to light.  Neck: Normal range of motion. Neck supple. No thyromegaly present.  Cardiovascular: Normal rate and regular rhythm. Exam reveals no gallop and no friction rub.  No murmur heard. Pulmonary/Chest: Effort normal and breath sounds normal. No respiratory distress. She has no wheezes. Right breast exhibits no mass, no skin change and no tenderness. Left breast exhibits no mass, no skin change and no  tenderness.  Abdominal: Soft. Bowel sounds are normal. She exhibits no distension. There is no tenderness. There is no rebound.  Musculoskeletal: Normal range of motion.  Neurological: She is alert and oriented to person, place, and time. No cranial nerve deficit.  Skin: Skin is warm and dry.  Psychiatric: She has a normal mood and affect. Judgment normal.  Vitals reviewed.  Assessment:  ANNUAL EXAM 1. Annual physical exam   2. Uterovaginal prolapse   3. Screening for breast cancer    Screening Plan:            1.  Cervical Screening-  PAP nv due to heavy menses today  2. Breast screening- Exam annually and mammogram>40 planned   3. Colonoscopy every 10 years, Hemoccult testing - after age 2  4. Labs To return fasting at a later date, also check B12 level for f/u  5. Counseling for contraception: no method   6. Uterovaginal prolapse Assess by exam when not bleeding, nv Options of pessary, surgery discussed  Flu shot already done at [facility] where she works    F/U  Return in about 1 week (around 02/24/2018) for Follow up PAP.  Barnett Applebaum, MD, [facility], [facility] 02/17/2018  9:07 AM

## 2018-02-18 ENCOUNTER — Telehealth: Payer: Self-pay

## 2018-02-18 ENCOUNTER — Encounter: Payer: Self-pay | Admitting: Obstetrics & Gynecology

## 2018-02-18 NOTE — Telephone Encounter (Signed)
Letter done

## 2018-02-18 NOTE — Telephone Encounter (Signed)
Pt aware.

## 2018-02-18 NOTE — Telephone Encounter (Signed)
Pt was seen yesterday and was given a note stating she was seen yesterday in our office. Pt wants to know if we can give her a note excusing her from work on the ,15th,16th, 17th, and 18th, states she was out of work, with pain and heavy bleeding. She states she needs this so she wont lose her job. Please advise

## 2018-02-25 ENCOUNTER — Ambulatory Visit: Payer: Commercial Managed Care - PPO | Admitting: Obstetrics & Gynecology

## 2018-02-28 ENCOUNTER — Other Ambulatory Visit (HOSPITAL_COMMUNITY)
Admission: RE | Admit: 2018-02-28 | Discharge: 2018-02-28 | Disposition: A | Discharge status: Home / Self Care | Payer: Commercial Managed Care - PPO | Source: Ambulatory Visit | Attending: Obstetrics & Gynecology | Admitting: Obstetrics & Gynecology

## 2018-02-28 ENCOUNTER — Ambulatory Visit (INDEPENDENT_AMBULATORY_CARE_PROVIDER_SITE_OTHER): Payer: Commercial Managed Care - PPO | Admitting: Obstetrics & Gynecology

## 2018-02-28 ENCOUNTER — Encounter: Payer: Self-pay | Admitting: Obstetrics & Gynecology

## 2018-02-28 VITALS — BP 140/80 | Ht 62.0 in | Wt 146.0 lb

## 2018-02-28 DIAGNOSIS — Z124 Encounter for screening for malignant neoplasm of cervix: Secondary | ICD-10-CM

## 2018-02-28 DIAGNOSIS — N812 Incomplete uterovaginal prolapse: Secondary | ICD-10-CM

## 2018-02-28 DIAGNOSIS — N814 Uterovaginal prolapse, unspecified: Secondary | ICD-10-CM | POA: Diagnosis not present

## 2018-02-28 NOTE — Progress Notes (Signed)
PRE-OPERATIVE HISTORY AND PHYSICAL EXAM  HPI:  Maria [name] is a 47 y.o. [id number] Patient's last menstrual period was 02/16/2018.; she is being admitted for surgery related to pelvic relaxation.  Pt has noted cervical prolapse for many mos now.  Tried pessary in past but fell out, and did not like it.  Still has period every 2-5 mos, heavy when she does.  Intercourse impossible due to prolapse.  PMHx: Past Medical History:  Diagnosis Date  . Elevated BP without diagnosis of hypertension   . Pancreatitis    History reviewed. No pertinent surgical history. Family History  Problem Relation Age of Onset  . Arthritis Mother   . Arthritis Father   . Alcohol abuse Father   . Hypertension Father   . Heart disease Father   . Aneurysm Father   . Breast cancer Maternal Grandmother   . Breast cancer Paternal Grandmother    Social History   Tobacco Use  . Smoking status: Current Every Day Smoker    Packs/day: 1.00    Types: Cigarettes  . Smokeless tobacco: Never Used  Substance Use Topics  . Alcohol use: Yes    Alcohol/week: 2.0 standard drinks    Types: 2 Glasses of wine per week    Comment: two glasses a day  . Drug use: No    Current Outpatient Medications:  .  folic acid (FOLVITE) 1 MG tablet, Take 1 tablet (1 mg total) by mouth daily., Disp: 90 tablet, Rfl: 1 .  naproxen (NAPROSYN) 500 MG tablet, Take 1 tablet (500 mg total) by mouth 2 (two) times daily as needed for moderate pain., Disp: 60 tablet, Rfl: 0 .  vitamin B-12 (CYANOCOBALAMIN) 1000 MCG tablet, Take 1 tablet (1,000 mcg total) by mouth daily., Disp: , Rfl:  Allergies: Bupropion; Codeine; and Other  Review of Systems  Constitutional: Negative for chills, fever and malaise/fatigue.  HENT: Negative for congestion, sinus pain and sore throat.   Eyes: Negative for blurred vision and pain.  Respiratory: Negative for cough and wheezing.   Cardiovascular: Negative for chest pain and leg swelling.  Gastrointestinal:  Negative for abdominal pain, constipation, diarrhea, heartburn, nausea and vomiting.  Genitourinary: Negative for dysuria, frequency, hematuria and urgency.  Musculoskeletal: Negative for back pain, joint pain, myalgias and neck pain.  Skin: Negative for itching and rash.  Neurological: Negative for dizziness, tremors and weakness.  Endo/Heme/Allergies: Does not bruise/bleed easily.  Psychiatric/Behavioral: Negative for depression. The patient is not nervous/anxious and does not have insomnia.    Objective: BP 140/80   Ht 5\' 2"  (1.575 m)   Wt 146 lb (66.2 kg)   LMP 02/16/2018   BMI 26.70 kg/m   Filed Weights   02/28/18 1009  Weight: 146 lb (66.2 kg)   Physical Exam  Constitutional: She is oriented to person, place, and time. She appears well-developed and well-nourished. No distress.  Genitourinary: Rectum normal, vagina normal and uterus normal. Pelvic exam was performed with patient supine. There is no rash or lesion on the right labia. There is no rash or lesion on the left labia. Vagina exhibits no lesion. No bleeding in the vagina. Right adnexum does not display mass and does not display tenderness. Left adnexum does not display mass and does not display tenderness. Cervix does not exhibit motion tenderness, lesion, friability or polyp.   Uterus is mobile and midaxial. Uterus is not enlarged or exhibiting a mass.  Genitourinary Comments: Gr 4 Cervical uterine prolpase, to outside of vagina No excoriations  although cervical tissue dry, thickened Gr 1 cystocele  HENT:  Head: Normocephalic and atraumatic. Head is without laceration.  Right Ear: Hearing normal.  Left Ear: Hearing normal.  Nose: No epistaxis.  No foreign bodies.  Mouth/Throat: Uvula is midline, oropharynx is clear and moist and mucous membranes are normal.  Eyes: Pupils are equal, round, and reactive to light.  Neck: Normal range of motion. Neck supple. No thyromegaly present.  Cardiovascular: Normal rate and  regular rhythm. Exam reveals no gallop and no friction rub.  No murmur heard. Pulmonary/Chest: Effort normal and breath sounds normal. No respiratory distress. She has no wheezes. Right breast exhibits no mass, no skin change and no tenderness. Left breast exhibits no mass, no skin change and no tenderness.  Abdominal: Soft. Bowel sounds are normal. She exhibits no distension. There is no tenderness. There is no rebound.  Musculoskeletal: Normal range of motion.  Neurological: She is alert and oriented to person, place, and time. No cranial nerve deficit.  Skin: Skin is warm and dry.  Psychiatric: She has a normal mood and affect. Judgment normal.  Vitals reviewed.  Assessment: 1. Screening for cervical cancer   2. Cervical prolapse   3. Uterine prolapse   PAP today Plan surgery by [facility] [place] for her prolapse  I have had a careful discussion with this patient about all the options available and the risk/benefits of each. I have fully informed this patient that surgery may subject her to a variety of discomforts and risks: She understands that most patients have surgery with little difficulty, but problems can happen ranging from minor to fatal. These include nausea, vomiting, pain, bleeding, infection, poor healing, hernia, or formation of adhesions. Unexpected reactions may occur from any drug or anesthetic given. Unintended injury may occur to other pelvic or abdominal structures such as Fallopian tubes, ovaries, bladder, ureter (tube from kidney to bladder), or bowel. Nerves going from the pelvis to the legs may be injured. Any such injury may require immediate or later additional surgery to correct the problem. Excessive blood loss requiring transfusion is very unlikely but possible. Dangerous blood clots may form in the legs or lungs. Physical and sexual activity will be restricted in varying degrees for an indeterminate period of time but most often 2-6 weeks.  Finally, she understands that it is  impossible to list every possible undesirable effect and that the condition for which surgery is done is not always cured or significantly improved, and in rare cases may be even worse.Ample time was given to answer all questions.  Barnett Applebaum, MD, [facility], [facility] 02/28/2018  10:35 AM

## 2018-02-28 NOTE — Patient Instructions (Signed)
Vaginal Hysterectomy, Care After °Refer to this sheet in the next few weeks. These instructions provide you with information about caring for yourself after your procedure. Your health care provider may also give you more specific instructions. Your treatment has been planned according to current medical practices, but problems sometimes occur. Call your health care provider if you have any problems or questions after your procedure. °What can I expect after the procedure? °After the procedure, it is common to have: °· Pain. °· Soreness and numbness in your incision areas. °· Vaginal bleeding and discharge. °· Constipation. °· Temporary problems emptying the bladder. °· Feelings of sadness or other emotions. ° °Follow these instructions at home: °Medicines °· Take over-the-counter and prescription medicines only as told by your health care provider. °· If you were prescribed an antibiotic medicine, take it as told by your health care provider. Do not stop taking the antibiotic even if you start to feel better. °· Do not drive or operate heavy machinery while taking prescription pain medicine. °Activity °· Return to your normal activities as told by your health care provider. Ask your health care provider what activities are safe for you. °· Get regular exercise as told by your health care provider. You may be told to take short walks every day and go farther each time. °· Do not lift anything that is heavier than 10 lb (4.5 kg). °General instructions ° °· Do not put anything in your vagina for 6 weeks after your surgery or as told by your health care provider. This includes tampons and douches. °· Do not have sex until your health care provider says you can. °· Do not take baths, swim, or use a hot tub until your health care provider approves. °· Drink enough fluid to keep your urine clear or pale yellow. °· Do not drive for 24 hours if you were given a sedative. °· Keep all follow-up visits as told by your health  care provider. This is important. °Contact a health care provider if: °· Your pain medicine is not helping. °· You have a fever. °· You have redness, swelling, or pain at your incision site. °· You have blood, pus, or a bad-smelling discharge from your vagina. °· You continue to have difficulty urinating. °Get help right away if: °· You have severe abdominal or back pain. °· You have heavy bleeding from your vagina. °· You have chest pain or shortness of breath. °This information is not intended to replace advice given to you by your health care provider. Make sure you discuss any questions you have with your health care provider. °Document Released: 10/10/2015 Document Revised: 11/24/2015 Document Reviewed: 07/03/2015 °Elsevier Interactive Patient Education © 2018 [employer] ° °

## 2018-02-28 NOTE — H&P (View-Only) (Signed)
PRE-OPERATIVE HISTORY AND PHYSICAL EXAM  HPI:  Maria [name] is a 47 y.o. [id number] Patient's last menstrual period was 02/16/2018.; she is being admitted for surgery related to pelvic relaxation.  Pt has noted cervical prolapse for many mos now.  Tried pessary in past but fell out, and did not like it.  Still has period every 2-5 mos, heavy when she does.  Intercourse impossible due to prolapse.  PMHx: Past Medical History:  Diagnosis Date  . Elevated BP without diagnosis of hypertension   . Pancreatitis    History reviewed. No pertinent surgical history. Family History  Problem Relation Age of Onset  . Arthritis Mother   . Arthritis Father   . Alcohol abuse Father   . Hypertension Father   . Heart disease Father   . Aneurysm Father   . Breast cancer Maternal Grandmother   . Breast cancer Paternal Grandmother    Social History   Tobacco Use  . Smoking status: Current Every Day Smoker    Packs/day: 1.00    Types: Cigarettes  . Smokeless tobacco: Never Used  Substance Use Topics  . Alcohol use: Yes    Alcohol/week: 2.0 standard drinks    Types: 2 Glasses of wine per week    Comment: two glasses a day  . Drug use: No    Current Outpatient Medications:  .  folic acid (FOLVITE) 1 MG tablet, Take 1 tablet (1 mg total) by mouth daily., Disp: 90 tablet, Rfl: 1 .  naproxen (NAPROSYN) 500 MG tablet, Take 1 tablet (500 mg total) by mouth 2 (two) times daily as needed for moderate pain., Disp: 60 tablet, Rfl: 0 .  vitamin B-12 (CYANOCOBALAMIN) 1000 MCG tablet, Take 1 tablet (1,000 mcg total) by mouth daily., Disp: , Rfl:  Allergies: Bupropion; Codeine; and Other  Review of Systems  Constitutional: Negative for chills, fever and malaise/fatigue.  HENT: Negative for congestion, sinus pain and sore throat.   Eyes: Negative for blurred vision and pain.  Respiratory: Negative for cough and wheezing.   Cardiovascular: Negative for chest pain and leg swelling.  Gastrointestinal:  Negative for abdominal pain, constipation, diarrhea, heartburn, nausea and vomiting.  Genitourinary: Negative for dysuria, frequency, hematuria and urgency.  Musculoskeletal: Negative for back pain, joint pain, myalgias and neck pain.  Skin: Negative for itching and rash.  Neurological: Negative for dizziness, tremors and weakness.  Endo/Heme/Allergies: Does not bruise/bleed easily.  Psychiatric/Behavioral: Negative for depression. The patient is not nervous/anxious and does not have insomnia.    Objective: BP 140/80   Ht 5\' 2"  (1.575 m)   Wt 146 lb (66.2 kg)   LMP 02/16/2018   BMI 26.70 kg/m   Filed Weights   02/28/18 1009  Weight: 146 lb (66.2 kg)   Physical Exam  Constitutional: She is oriented to person, place, and time. She appears well-developed and well-nourished. No distress.  Genitourinary: Rectum normal, vagina normal and uterus normal. Pelvic exam was performed with patient supine. There is no rash or lesion on the right labia. There is no rash or lesion on the left labia. Vagina exhibits no lesion. No bleeding in the vagina. Right adnexum does not display mass and does not display tenderness. Left adnexum does not display mass and does not display tenderness. Cervix does not exhibit motion tenderness, lesion, friability or polyp.   Uterus is mobile and midaxial. Uterus is not enlarged or exhibiting a mass.  Genitourinary Comments: Gr 4 Cervical uterine prolpase, to outside of vagina No excoriations  although cervical tissue dry, thickened Gr 1 cystocele  HENT:  Head: Normocephalic and atraumatic. Head is without laceration.  Right Ear: Hearing normal.  Left Ear: Hearing normal.  Nose: No epistaxis.  No foreign bodies.  Mouth/Throat: Uvula is midline, oropharynx is clear and moist and mucous membranes are normal.  Eyes: Pupils are equal, round, and reactive to light.  Neck: Normal range of motion. Neck supple. No thyromegaly present.  Cardiovascular: Normal rate and  regular rhythm. Exam reveals no gallop and no friction rub.  No murmur heard. Pulmonary/Chest: Effort normal and breath sounds normal. No respiratory distress. She has no wheezes. Right breast exhibits no mass, no skin change and no tenderness. Left breast exhibits no mass, no skin change and no tenderness.  Abdominal: Soft. Bowel sounds are normal. She exhibits no distension. There is no tenderness. There is no rebound.  Musculoskeletal: Normal range of motion.  Neurological: She is alert and oriented to person, place, and time. No cranial nerve deficit.  Skin: Skin is warm and dry.  Psychiatric: She has a normal mood and affect. Judgment normal.  Vitals reviewed.  Assessment: 1. Screening for cervical cancer   2. Cervical prolapse   3. Uterine prolapse   PAP today Plan surgery by [facility] for her prolapse  I have had a careful discussion with this patient about all the options available and the risk/benefits of each. I have fully informed this patient that surgery may subject her to a variety of discomforts and risks: She understands that most patients have surgery with little difficulty, but problems can happen ranging from minor to fatal. These include nausea, vomiting, pain, bleeding, infection, poor healing, hernia, or formation of adhesions. Unexpected reactions may occur from any drug or anesthetic given. Unintended injury may occur to other pelvic or abdominal structures such as Fallopian tubes, ovaries, bladder, ureter (tube from kidney to bladder), or bowel. Nerves going from the pelvis to the legs may be injured. Any such injury may require immediate or later additional surgery to correct the problem. Excessive blood loss requiring transfusion is very unlikely but possible. Dangerous blood clots may form in the legs or lungs. Physical and sexual activity will be restricted in varying degrees for an indeterminate period of time but most often 2-6 weeks.  Finally, she understands that it is  impossible to list every possible undesirable effect and that the condition for which surgery is done is not always cured or significantly improved, and in rare cases may be even worse.Ample time was given to answer all questions.  Barnett Applebaum, MD, [facility], [facility] 02/28/2018  10:35 AM

## 2018-03-04 ENCOUNTER — Telehealth: Payer: Self-pay | Admitting: Obstetrics & Gynecology

## 2018-03-04 NOTE — Telephone Encounter (Signed)
Patient is scheduled for Pre-admit Testing phone interview on 03/06/18 1-5pm, and OR on 03/13/18. Attempted to reach patient, but there was no answer, v/m is full, and no secondary phone# in chart.

## 2018-03-04 NOTE — Telephone Encounter (Signed)
Done on 02/28/18

## 2018-03-04 NOTE — Addendum Note (Signed)
Addended by: [name] on: 03/04/2018 04:10 PM   Modules accepted: Miquel Dunn

## 2018-03-05 LAB — CYTOLOGY - PAP
DIAGNOSIS: NEGATIVE
HPV (WINDOPATH): NOT DETECTED

## 2018-03-06 ENCOUNTER — Encounter
Admission: RE | Admit: 2018-03-06 | Discharge: 2018-03-06 | Disposition: A | Discharge status: Home / Self Care | Payer: Commercial Managed Care - PPO | Source: Ambulatory Visit | Attending: Obstetrics & Gynecology | Admitting: Obstetrics & Gynecology

## 2018-03-06 ENCOUNTER — Other Ambulatory Visit: Payer: Self-pay

## 2018-03-06 HISTORY — DX: Anxiety disorder, unspecified: F41.9

## 2018-03-06 HISTORY — DX: Gastro-esophageal reflux disease without esophagitis: K21.9

## 2018-03-06 HISTORY — DX: Essential (primary) hypertension: I10

## 2018-03-06 HISTORY — DX: Family history of other specified conditions: Z84.89

## 2018-03-06 HISTORY — DX: Depression, unspecified: F32.A

## 2018-03-06 HISTORY — DX: Anemia, unspecified: D64.9

## 2018-03-06 HISTORY — DX: Major depressive disorder, single episode, unspecified: F32.9

## 2018-03-06 NOTE — Patient Instructions (Signed)
Your procedure is scheduled on: 03-13-18 THURSDAY Report to Same Day Surgery 2nd floor medical mall [facility] Entrance-take elevator on left to 2nd floor.  Check in with surgery information desk.) To find out your arrival time please call [phone] between 1PM - 3PM on 03-12-18 [facility]  Remember: Instructions that are not followed completely may result in serious medical risk, up to and including death, or upon the discretion of your surgeon and anesthesiologist your surgery may need to be rescheduled.    _x___ 1. Do not eat food after midnight the night before your procedure. NO GUM OR CANDY AFTER MIDNIGHT.  You may drink clear liquids up to 2 hours before you are scheduled to arrive at the hospital for your procedure.  Do not drink clear liquids within 2 hours of your scheduled arrival to the hospital.  Clear liquids include  --Water or Apple juice without pulp  --Clear carbohydrate beverage such as ClearFast or Gatorade  --Black Coffee or Clear Tea (No milk, no creamers, do not add anything to the coffee or Tea   ____Ensure clear carbohydrate drink on the way to the hospital for bariatric patients  ____Ensure clear carbohydrate drink 3 hours before surgery for Dr Dwyane Luo patients if physician instructed.     __x__ 2. No Alcohol for 24 hours before or after surgery.   __x__3. No Smoking or e-cigarettes for 24 prior to surgery.  Do not use any chewable tobacco products for at least 6 hour prior to surgery   ____  4. Bring all medications with you on the day of surgery if instructed.    __x__ 5. Notify your doctor if there is any change in your medical condition     (cold, fever, infections).    x___6. On the morning of surgery brush your teeth with toothpaste and water.  You may rinse your mouth with mouth wash if you wish.  Do not swallow any toothpaste or mouthwash.   Do not wear jewelry, make-up, hairpins, clips or nail polish.  Do not wear lotions, powders, or  perfumes. You may wear deodorant.  Do not shave 48 hours prior to surgery. Men may shave face and neck.  Do not bring valuables to the hospital.    [facility] is not responsible for any belongings or valuables.               Contacts, dentures or bridgework may not be worn into surgery.  Leave your suitcase in the car. After surgery it may be brought to your room.  For patients admitted to the hospital, discharge time is determined by your treatment team.  _  Patients discharged the day of surgery will not be allowed to drive home.  You will need someone to drive you home and stay with you the night of your procedure.    Please read over the following fact sheets that you were given:   [facility] Preparing for Surgery   ____ Take anti-hypertensive listed below, cardiac, seizure, asthma, anti-reflux and psychiatric medicines. These include:  1. NONE  2.  3.  4.  5.  6.  ____Fleets enema or Magnesium Citrate as directed.   ____ Use CHG Soap or sage wipes as directed on instruction sheet   ____ Use inhalers on the day of surgery and bring to hospital day of surgery  ____ Stop Metformin and Janumet 2 days prior to surgery.    ____ Take 1/2 of usual insulin dose the night before surgery and none  on the morning surgery.   ____ Follow recommendations from Cardiologist, Pulmonologist or PCP regarding stopping Aspirin, Coumadin, Plavix ,Eliquis, Effient, or Pradaxa, and Pletal.  X____Stop Anti-inflammatories such as Advil, Aleve, Ibuprofen, Motrin, Naproxen, Naprosyn, Goodies powders or aspirin products NOW-OK to take Tylenol    ____ Stop supplements until after surgery.     ____ Bring C-Pap to the hospital.

## 2018-03-10 ENCOUNTER — Encounter
Admission: RE | Admit: 2018-03-10 | Discharge: 2018-03-10 | Disposition: A | Discharge status: Home / Self Care | Payer: Commercial Managed Care - PPO | Source: Ambulatory Visit | Attending: Obstetrics & Gynecology | Admitting: Obstetrics & Gynecology

## 2018-03-10 DIAGNOSIS — R030 Elevated blood-pressure reading, without diagnosis of hypertension: Secondary | ICD-10-CM | POA: Diagnosis not present

## 2018-03-10 DIAGNOSIS — N72 Inflammatory disease of cervix uteri: Secondary | ICD-10-CM | POA: Diagnosis not present

## 2018-03-10 DIAGNOSIS — Z885 Allergy status to narcotic agent status: Secondary | ICD-10-CM | POA: Diagnosis not present

## 2018-03-10 DIAGNOSIS — K859 Acute pancreatitis without necrosis or infection, unspecified: Secondary | ICD-10-CM | POA: Diagnosis not present

## 2018-03-10 DIAGNOSIS — D259 Leiomyoma of uterus, unspecified: Secondary | ICD-10-CM | POA: Diagnosis not present

## 2018-03-10 DIAGNOSIS — N812 Incomplete uterovaginal prolapse: Secondary | ICD-10-CM | POA: Diagnosis present

## 2018-03-10 DIAGNOSIS — Z79899 Other long term (current) drug therapy: Secondary | ICD-10-CM | POA: Diagnosis not present

## 2018-03-10 DIAGNOSIS — F17210 Nicotine dependence, cigarettes, uncomplicated: Secondary | ICD-10-CM | POA: Diagnosis not present

## 2018-03-10 DIAGNOSIS — N840 Polyp of corpus uteri: Secondary | ICD-10-CM | POA: Diagnosis not present

## 2018-03-10 LAB — CBC
HCT: 40.3 % (ref 35.0–47.0)
Hemoglobin: 14.4 g/dL (ref 12.0–16.0)
MCH: 38.7 pg — ABNORMAL HIGH (ref 26.0–34.0)
MCHC: 35.8 g/dL (ref 32.0–36.0)
MCV: 108.3 fL — ABNORMAL HIGH (ref 80.0–100.0)
Platelets: 151 10*3/uL (ref 150–440)
RBC: 3.72 MIL/uL — ABNORMAL LOW (ref 3.80–5.20)
RDW: 12.7 % (ref 11.5–14.5)
WBC: 10 10*3/uL (ref 3.6–11.0)

## 2018-03-10 LAB — TYPE AND SCREEN
ABO/RH(D): O POS
ANTIBODY SCREEN: NEGATIVE

## 2018-03-12 ENCOUNTER — Other Ambulatory Visit: Payer: Self-pay | Admitting: Obstetrics & Gynecology

## 2018-03-12 ENCOUNTER — Encounter: Payer: Self-pay | Admitting: *Deleted

## 2018-03-12 MED ORDER — NICOTINE 7 MG/24HR TD PT24: 7.0000 mg | MEDICATED_PATCH | Freq: Every day | TRANSDERMAL | 0 refills | Status: DC

## 2018-03-12 MED ORDER — SODIUM CHLORIDE 0.9 % IV SOLN
2.0000 g | INTRAVENOUS | Status: AC
  Administered 2018-03-13: 2 g via INTRAVENOUS

## 2018-03-13 ENCOUNTER — Ambulatory Visit: Payer: Commercial Managed Care - PPO | Admitting: Certified Registered Nurse Anesthetist

## 2018-03-13 ENCOUNTER — Ambulatory Visit
Admission: RE | Admit: 2018-03-13 | Discharge: 2018-03-13 | Disposition: A | Discharge status: Home / Self Care | Payer: Commercial Managed Care - PPO | Source: Ambulatory Visit | Attending: Obstetrics & Gynecology | Admitting: Obstetrics & Gynecology

## 2018-03-13 ENCOUNTER — Encounter
Admission: RE | Disposition: A | Discharge status: Home / Self Care | Payer: Self-pay | Source: Ambulatory Visit | Attending: Obstetrics & Gynecology

## 2018-03-13 ENCOUNTER — Other Ambulatory Visit: Payer: Self-pay

## 2018-03-13 ENCOUNTER — Encounter: Payer: Self-pay | Admitting: *Deleted

## 2018-03-13 DIAGNOSIS — N813 Complete uterovaginal prolapse: Secondary | ICD-10-CM

## 2018-03-13 DIAGNOSIS — N840 Polyp of corpus uteri: Secondary | ICD-10-CM | POA: Insufficient documentation

## 2018-03-13 DIAGNOSIS — D251 Intramural leiomyoma of uterus: Secondary | ICD-10-CM

## 2018-03-13 DIAGNOSIS — N812 Incomplete uterovaginal prolapse: Secondary | ICD-10-CM | POA: Diagnosis not present

## 2018-03-13 DIAGNOSIS — Z885 Allergy status to narcotic agent status: Secondary | ICD-10-CM | POA: Insufficient documentation

## 2018-03-13 DIAGNOSIS — R030 Elevated blood-pressure reading, without diagnosis of hypertension: Secondary | ICD-10-CM | POA: Insufficient documentation

## 2018-03-13 DIAGNOSIS — Z79899 Other long term (current) drug therapy: Secondary | ICD-10-CM | POA: Insufficient documentation

## 2018-03-13 DIAGNOSIS — F17210 Nicotine dependence, cigarettes, uncomplicated: Secondary | ICD-10-CM | POA: Insufficient documentation

## 2018-03-13 DIAGNOSIS — N72 Inflammatory disease of cervix uteri: Secondary | ICD-10-CM

## 2018-03-13 DIAGNOSIS — K859 Acute pancreatitis without necrosis or infection, unspecified: Secondary | ICD-10-CM | POA: Insufficient documentation

## 2018-03-13 DIAGNOSIS — D259 Leiomyoma of uterus, unspecified: Secondary | ICD-10-CM | POA: Insufficient documentation

## 2018-03-13 HISTORY — PX: VAGINAL HYSTERECTOMY: SHX2639

## 2018-03-13 HISTORY — PX: ANTERIOR AND POSTERIOR REPAIR WITH SACROSPINOUS FIXATION: SHX6536

## 2018-03-13 LAB — POCT PREGNANCY, URINE: Preg Test, Ur: NEGATIVE

## 2018-03-13 LAB — ABO/RH: ABO/RH(D): O POS

## 2018-03-13 SURGERY — HYSTERECTOMY, VAGINAL
Anesthesia: General

## 2018-03-13 MED ORDER — KETOROLAC TROMETHAMINE 30 MG/ML IJ SOLN
INTRAMUSCULAR | Status: DC | PRN
  Administered 2018-03-13: 30 mg via INTRAVENOUS

## 2018-03-13 MED ORDER — FENTANYL CITRATE (PF) 100 MCG/2ML IJ SOLN
INTRAMUSCULAR | Status: DC | PRN
  Administered 2018-03-13 (×2): 50 ug via INTRAVENOUS
  Administered 2018-03-13: 100 ug via INTRAVENOUS

## 2018-03-13 MED ORDER — OXYCODONE HCL 5 MG PO TABS: 5.0000 mg | ORAL_TABLET | Freq: Once | ORAL | Status: DC | PRN

## 2018-03-13 MED ORDER — SUGAMMADEX SODIUM 200 MG/2ML IV SOLN
INTRAVENOUS | Status: DC | PRN
  Administered 2018-03-13: 137 mg via INTRAVENOUS

## 2018-03-13 MED ORDER — ROCURONIUM BROMIDE 50 MG/5ML IV SOLN
INTRAVENOUS | Status: AC
  Filled 2018-03-13: qty 1

## 2018-03-13 MED ORDER — FAMOTIDINE 20 MG PO TABS
20.0000 mg | ORAL_TABLET | Freq: Once | ORAL | Status: AC
  Administered 2018-03-13: 20 mg via ORAL

## 2018-03-13 MED ORDER — ONDANSETRON HCL 4 MG/2ML IJ SOLN
INTRAMUSCULAR | Status: AC
  Filled 2018-03-13: qty 2

## 2018-03-13 MED ORDER — ROCURONIUM BROMIDE 100 MG/10ML IV SOLN
INTRAVENOUS | Status: DC | PRN
  Administered 2018-03-13: 40 mg via INTRAVENOUS
  Administered 2018-03-13: 10 mg via INTRAVENOUS

## 2018-03-13 MED ORDER — LACTATED RINGERS IV SOLN
INTRAVENOUS | Status: DC
  Administered 2018-03-13: 14:00:00 via INTRAVENOUS

## 2018-03-13 MED ORDER — SUGAMMADEX SODIUM 200 MG/2ML IV SOLN
INTRAVENOUS | Status: AC
  Filled 2018-03-13: qty 2

## 2018-03-13 MED ORDER — PROPOFOL 10 MG/ML IV BOLUS
INTRAVENOUS | Status: AC
  Filled 2018-03-13: qty 20

## 2018-03-13 MED ORDER — EPHEDRINE SULFATE 50 MG/ML IJ SOLN
INTRAMUSCULAR | Status: DC | PRN
  Administered 2018-03-13: 5 mg via INTRAVENOUS

## 2018-03-13 MED ORDER — DEXAMETHASONE SODIUM PHOSPHATE 10 MG/ML IJ SOLN
INTRAMUSCULAR | Status: AC
  Filled 2018-03-13: qty 1

## 2018-03-13 MED ORDER — LIDOCAINE-EPINEPHRINE 1 %-1:100000 IJ SOLN
INTRAMUSCULAR | Status: AC
  Filled 2018-03-13: qty 1

## 2018-03-13 MED ORDER — PROPOFOL 10 MG/ML IV BOLUS
INTRAVENOUS | Status: DC | PRN
  Administered 2018-03-13: 30 mg via INTRAVENOUS
  Administered 2018-03-13: 170 mg via INTRAVENOUS

## 2018-03-13 MED ORDER — SODIUM CHLORIDE 0.9 % IV SOLN
INTRAVENOUS | Status: AC
  Filled 2018-03-13: qty 2

## 2018-03-13 MED ORDER — FENTANYL CITRATE (PF) 100 MCG/2ML IJ SOLN
INTRAMUSCULAR | Status: AC
  Filled 2018-03-13: qty 2

## 2018-03-13 MED ORDER — LACTATED RINGERS IV SOLN: INTRAVENOUS | Status: DC

## 2018-03-13 MED ORDER — ONDANSETRON HCL 4 MG/2ML IJ SOLN
INTRAMUSCULAR | Status: DC | PRN
  Administered 2018-03-13: 4 mg via INTRAVENOUS

## 2018-03-13 MED ORDER — MIDAZOLAM HCL 2 MG/2ML IJ SOLN
INTRAMUSCULAR | Status: AC
  Filled 2018-03-13: qty 2

## 2018-03-13 MED ORDER — KETOROLAC TROMETHAMINE 30 MG/ML IJ SOLN: 30.0000 mg | Freq: Four times a day (QID) | INTRAMUSCULAR | Status: DC

## 2018-03-13 MED ORDER — OXYCODONE-ACETAMINOPHEN 5-325 MG PO TABS
ORAL_TABLET | ORAL | Status: AC
  Administered 2018-03-13: 1 via ORAL
  Filled 2018-03-13: qty 1

## 2018-03-13 MED ORDER — LIDOCAINE HCL (CARDIAC) PF 100 MG/5ML IV SOSY
PREFILLED_SYRINGE | INTRAVENOUS | Status: DC | PRN
  Administered 2018-03-13: 60 mg via INTRAVENOUS

## 2018-03-13 MED ORDER — ESTROGENS, CONJUGATED 0.625 MG/GM VA CREA
TOPICAL_CREAM | VAGINAL | Status: AC
  Filled 2018-03-13: qty 30

## 2018-03-13 MED ORDER — ACETAMINOPHEN 325 MG PO TABS: 650.0000 mg | ORAL_TABLET | ORAL | Status: DC | PRN

## 2018-03-13 MED ORDER — FENTANYL CITRATE (PF) 100 MCG/2ML IJ SOLN: 25.0000 ug | INTRAMUSCULAR | Status: DC | PRN

## 2018-03-13 MED ORDER — OXYCODONE-ACETAMINOPHEN 5-325 MG PO TABS: 1.0000 | ORAL_TABLET | ORAL | 0 refills | Status: DC | PRN

## 2018-03-13 MED ORDER — MIDAZOLAM HCL 2 MG/2ML IJ SOLN
INTRAMUSCULAR | Status: DC | PRN
  Administered 2018-03-13: 2 mg via INTRAVENOUS

## 2018-03-13 MED ORDER — OXYCODONE HCL 5 MG/5ML PO SOLN: 5.0000 mg | Freq: Once | ORAL | Status: DC | PRN

## 2018-03-13 MED ORDER — OXYCODONE-ACETAMINOPHEN 5-325 MG PO TABS: 1.0000 | ORAL_TABLET | ORAL | Status: DC | PRN

## 2018-03-13 MED ORDER — FAMOTIDINE 20 MG PO TABS
ORAL_TABLET | ORAL | Status: AC
  Administered 2018-03-13: 20 mg via ORAL
  Filled 2018-03-13: qty 1

## 2018-03-13 MED ORDER — KETOROLAC TROMETHAMINE 30 MG/ML IJ SOLN
INTRAMUSCULAR | Status: AC
  Filled 2018-03-13: qty 1

## 2018-03-13 MED ORDER — DEXAMETHASONE SODIUM PHOSPHATE 10 MG/ML IJ SOLN
INTRAMUSCULAR | Status: DC | PRN
  Administered 2018-03-13: 10 mg via INTRAVENOUS

## 2018-03-13 MED ORDER — OXYCODONE-ACETAMINOPHEN 5-325 MG PO TABS
1.0000 | ORAL_TABLET | Freq: Once | ORAL | Status: AC
  Administered 2018-03-13: 1 via ORAL

## 2018-03-13 MED ORDER — ACETAMINOPHEN 650 MG RE SUPP: 650.0000 mg | RECTAL | Status: DC | PRN

## 2018-03-13 MED ORDER — LIDOCAINE-EPINEPHRINE 1 %-1:100000 IJ SOLN
INTRAMUSCULAR | Status: DC | PRN
  Administered 2018-03-13: 10 mL via INTRADERMAL

## 2018-03-13 MED ORDER — ESTROGENS, CONJUGATED 0.625 MG/GM VA CREA
TOPICAL_CREAM | VAGINAL | Status: DC | PRN
  Administered 2018-03-13: 1 via VAGINAL

## 2018-03-13 MED ORDER — MORPHINE SULFATE (PF) 4 MG/ML IV SOLN: 1.0000 mg | INTRAVENOUS | Status: DC | PRN

## 2018-03-13 SURGICAL SUPPLY — 48 items
BAG URINE DRAINAGE (UROLOGICAL SUPPLIES) ×3 IMPLANT
BLADE SURG 15 STRL LF DISP TIS (BLADE) ×1 IMPLANT
BLADE SURG 15 STRL SS (BLADE) ×2
BLADE SURG SZ10 CARB STEEL (BLADE) ×3 IMPLANT
CANISTER SUCT 1200ML W/VALVE (MISCELLANEOUS) ×3 IMPLANT
CATH FOLEY 2WAY  5CC 16FR (CATHETERS) ×2
CATH URTH 16FR FL 2W BLN LF (CATHETERS) ×1 IMPLANT
DRAPE PERI LITHO V/GYN (MISCELLANEOUS) ×3 IMPLANT
DRAPE SHEET LG 3/4 BI-LAMINATE (DRAPES) ×3 IMPLANT
DRAPE UNDER BUTTOCK W/FLU (DRAPES) ×3 IMPLANT
ELECT CAUTERY BLADE 6.4 (BLADE) IMPLANT
ELECT REM PT RETURN 9FT ADLT (ELECTROSURGICAL) ×3
ELECTRODE REM PT RTRN 9FT ADLT (ELECTROSURGICAL) ×1 IMPLANT
GAUZE PACK 2X3YD (MISCELLANEOUS) ×3 IMPLANT
GLOVE BIO SURGEON STRL SZ8 (GLOVE) ×6 IMPLANT
GOWN STRL REUS W/ TWL LRG LVL3 (GOWN DISPOSABLE) ×3 IMPLANT
GOWN STRL REUS W/ TWL XL LVL3 (GOWN DISPOSABLE) ×1 IMPLANT
GOWN STRL REUS W/TWL LRG LVL3 (GOWN DISPOSABLE) ×6
GOWN STRL REUS W/TWL XL LVL3 (GOWN DISPOSABLE) ×2
GRADUATE 1200CC STRL 31836 (MISCELLANEOUS) ×3 IMPLANT
KIT TURNOVER CYSTO (KITS) ×3 IMPLANT
KIT TURNOVER KIT A (KITS) ×3 IMPLANT
LABEL OR SOLS (LABEL) ×3 IMPLANT
NDL HPO THNWL 1X22GA REG BVL (NEEDLE) ×1 IMPLANT
NDL MAYO CATGUT SZ4 (NEEDLE) ×3 IMPLANT
NEEDLE SAFETY 22GX1 (NEEDLE) ×2
NEEDLE SPNL 22GX3.5 QUINCKE BK (NEEDLE) ×3 IMPLANT
NS IRRIG 500ML POUR BTL (IV SOLUTION) ×3 IMPLANT
PACK BASIN MINOR ARMC (MISCELLANEOUS) ×3 IMPLANT
PAD OB MATERNITY 4.3X12.25 (PERSONAL CARE ITEMS) ×3 IMPLANT
PAD PREP 24X41 OB/GYN DISP (PERSONAL CARE ITEMS) ×3 IMPLANT
SOL PREP PVP 2OZ (MISCELLANEOUS) ×3
SOLUTION PREP PVP 2OZ (MISCELLANEOUS) ×1 IMPLANT
SPONGE XRAY 4X4 16PLY STRL (MISCELLANEOUS) ×3 IMPLANT
STRAP SAFETY 5IN WIDE (MISCELLANEOUS) ×3 IMPLANT
SUT ETHIBOND NAB CT1 #1 30IN (SUTURE) ×12 IMPLANT
SUT VIC AB 0 CT1 27 (SUTURE) ×8
SUT VIC AB 0 CT1 27XCR 8 STRN (SUTURE) ×4 IMPLANT
SUT VIC AB 0 CT1 36 (SUTURE) ×3 IMPLANT
SUT VIC AB 1 CT1 36 (SUTURE) ×3 IMPLANT
SUT VIC AB 2-0 CT1 (SUTURE) ×6 IMPLANT
SUT VIC AB 2-0 CT1 27 (SUTURE) ×2
SUT VIC AB 2-0 CT1 TAPERPNT 27 (SUTURE) ×1 IMPLANT
SUT VIC AB 3-0 SH 27 (SUTURE) ×2
SUT VIC AB 3-0 SH 27X BRD (SUTURE) ×1 IMPLANT
SYR 10ML LL (SYRINGE) ×3 IMPLANT
SYR BULB IRRIG 60ML STRL (SYRINGE) ×3 IMPLANT
SYR CONTROL 10ML (SYRINGE) ×3 IMPLANT

## 2018-03-13 NOTE — Transfer of Care (Signed)
Immediate Anesthesia Transfer of Care Note  Patient: Maria [name]  Procedure(s) Performed: HYSTERECTOMY VAGINAL (N/A ) ANTERIOR REPAIR (N/A )  Patient Location: PACU  Anesthesia Type:General  Level of Consciousness: awake and alert   Airway & Oxygen Therapy: Patient Spontanous Breathing and Patient connected to face mask oxygen  Post-op Assessment: Report given to RN and Post -op Vital signs reviewed and stable  Post vital signs: Reviewed  Last Vitals:  Vitals Value Taken Time  BP 140/73 03/13/2018  5:07 PM  Temp    Pulse 87 03/13/2018  5:09 PM  Resp 17 03/13/2018  5:09 PM  SpO2 100 % 03/13/2018  5:09 PM  Vitals shown include unvalidated device data.  Last Pain:  Vitals:   03/13/18 1310  TempSrc: Temporal  PainSc: 0-No pain         Complications: No apparent anesthesia complications

## 2018-03-13 NOTE — Op Note (Signed)
Preoperative diagnosis: Pelvic organ prolapse and Cystocele  Postoperative diagnosis: Same  Procedure: Transvaginal hysterectomy with Anterior Colporrhaphy  Surgeon: Barnett Applebaum, M.D.  Assistant: Marisue Brooklyn, MD; No other capable assistant available, in surgery requiring high level assistant.  Anesthesia: general  Findings: Complete uterine prolapse and cystocele.  Estimated blood loss: 50 cc  Specimen: Uterus to pathology   Disposition: Tolerated procedure well  Procedure: Patient was taken to the OR where she was placed in dorsal lithotomy in [place]. She was prepped and draped in the usual sterile fashion. A timeout was performed. Foley is placed into bladder. A speculum was placed inside the vagina. The cervix was visualized and grasped with a tenaculum. 1% lidocaine with epinephrine were injected paracervically. A bovie was used to make a circumferential incision around the vagina. An opened sponge was used to dissect the vagina off the cervix. The posterior peritoneum was entered sharply with Mayo scissors.  The anterior peritoneal cavity was entered sharply with careful dissection of the bladder off the underlying cervix. A Heaney clamp was used to clamp first the left uterosacral ligament and cardinal which was then cut and Haney suture ligated with 0 Vicryl stitch, the stitch was held and later attached to the vaginal mucosa. Similarly the right uterosacral ligament was clamped cut and suture ligated.  Sequential clamping, transecting and suture ligating up the broad to the uterine arteries were taken until the tubo-ovarian pedicles were encountered. The uterus was then amputated. Inspection of all pedicles revealed adequate hemostasis.  The peritoneum was then closed with a running pursestring suture of 0 Vicryl.  Vagina is irrigated. Anterior colporrhaphy is performed.  Allis clamps are placed along the anterior vaginal wall, lidocaine is used to infiltrate the plane, and  incision is made midline vertical.  Endopelvic fascia is dissected free of vaginal mucosa.  Fascia is plicated w interrupted vicryl sutures.  Excess mucosa is excised.  Vaginal incision is closed with a running locking Vicryl suture, to incoprate the hysterectomy incision as well, with care taken to incorporate the uterosacral pedicles. Excellent hemostasis was noted at the end of the case. The vaginal cuff was inspected there was minimal bleeding noted.  Packing gauze w Premarin cream is placed. A Foley catheter is left in  place inside her bladder. Clear, yellow urine was noted. All instrument needle and lap counts were correct x 2. Patient was awakened taken to recovery room in stable condition.  Hoyt Koch, MD 03/13/2018, 4:59 PM

## 2018-03-13 NOTE — Progress Notes (Signed)
Dr. Kenton Kingfisher called as vaginal packing and foley were removed at 1815 and pt got up to use the bathroom around 1835. Pt got up from the toilet and a moderate amount of blood was noted in the toilet. Pt stated she was able to urinate. When pt got up from the toilet, a steady drip of blood was noted from the vagina. Dr. Kenton Kingfisher stated to continue to monitor the pt to see if bleeding decreases. Maria [name] E 6:48 PM 03/13/2018

## 2018-03-13 NOTE — Interval H&P Note (Signed)
History and Physical Interval Note:  03/13/2018 12:13 PM  Maria [name]  has presented today for surgery, with the diagnosis of CERVICAL PROLAPSE  The various methods of treatment have been discussed with the patient and family. After consideration of risks, benefits and other options for treatment, the patient has consented to  Procedure(s): HYSTERECTOMY VAGINAL (N/A) ANTERIOR AND POSTERIOR REPAIR (N/A) as a surgical intervention .  The patient's history has been reviewed, patient examined, no change in status, stable for surgery.  I have reviewed the patient's chart and labs.  Questions were answered to the patient's satisfaction.     Hoyt Koch

## 2018-03-13 NOTE — Anesthesia Preprocedure Evaluation (Signed)
Anesthesia Evaluation  Patient identified by MRN, date of birth, ID band Patient awake    Reviewed: Allergy & Precautions, H&P , NPO status , Patient's Chart, lab work & pertinent test results  History of Anesthesia Complications (+) Family history of anesthesia reaction and history of anesthetic complications  Airway Mallampati: III  TM Distance: <3 FB Neck ROM: full    Dental  (+) Chipped, Poor Dentition   Pulmonary neg shortness of breath, Current Smoker,           Cardiovascular Exercise Tolerance: Good hypertension, (-) angina(-) Past MI and (-) DOE      Neuro/Psych PSYCHIATRIC DISORDERS Anxiety Depression  Neuromuscular disease    GI/Hepatic Neg liver ROS, GERD  Medicated and Controlled,  Endo/Other  negative endocrine ROS  Renal/GU      Musculoskeletal   Abdominal   Peds  Hematology negative hematology ROS (+)   Anesthesia Other Findings Past Medical History: No date: Anemia No date: Anxiety     Comment:  h/o No date: Depression     Comment:  h/o No date: Family history of adverse reaction to anesthesia     Comment:  mom-hard time waking up No date: GERD (gastroesophageal reflux disease)     Comment:  tums prn No date: Hypertension     Comment:  WAS PUT ON BP MED BY PCP LAST YEAR (2018) AND BP MED               MADE PT SICK SO SHE STOPPED TAKING IT-PCP MONITORS BP NOW No date: Pancreatitis  Past Surgical History: No date: INDUCED ABORTION     Comment:  x2     Reproductive/Obstetrics negative OB ROS                             Anesthesia Physical Anesthesia Plan  ASA: III  Anesthesia Plan: General ETT   Post-op Pain Management:    Induction: Intravenous  PONV Risk Score and Plan: Ondansetron, Dexamethasone, Midazolam and Treatment may vary due to age or medical condition  Airway Management Planned: Oral ETT  Additional Equipment:   Intra-op Plan:    Post-operative Plan: Extubation in OR  Informed Consent: I have reviewed the patients History and Physical, chart, labs and discussed the procedure including the risks, benefits and alternatives for the proposed anesthesia with the patient or authorized representative who has indicated his/her understanding and acceptance.   Dental Advisory Given  Plan Discussed with: Anesthesiologist, CRNA and Surgeon  Anesthesia Plan Comments: (Patient consented for risks of anesthesia including but not limited to:  - adverse reactions to medications - damage to teeth, lips or other oral mucosa - sore throat or hoarseness - Damage to heart, brain, lungs or loss of life  Patient voiced understanding.)        Anesthesia Quick Evaluation

## 2018-03-13 NOTE — Discharge Instructions (Signed)
Vaginal Hysterectomy, Care After °Refer to this sheet in the next few weeks. These instructions provide you with information about caring for yourself after your procedure. Your health care provider may also give you more specific instructions. Your treatment has been planned according to current medical practices, but problems sometimes occur. Call your health care provider if you have any problems or questions after your procedure. °What can I expect after the procedure? °After the procedure, it is common to have: °· Pain. °· Soreness and numbness in your incision areas. °· Vaginal bleeding and discharge. °· Constipation. °· Temporary problems emptying the bladder. °· Feelings of sadness or other emotions. ° °Follow these instructions at home: °Medicines °· Take over-the-counter and prescription medicines only as told by your health care provider. °· If you were prescribed an antibiotic medicine, take it as told by your health care provider. Do not stop taking the antibiotic even if you start to feel better. °· Do not drive or operate heavy machinery while taking prescription pain medicine. °Activity °· Return to your normal activities as told by your health care provider. Ask your health care provider what activities are safe for you. °· Get regular exercise as told by your health care provider. You may be told to take short walks every day and go farther each time. °· Do not lift anything that is heavier than 10 lb (4.5 kg). °General instructions ° °· Do not put anything in your vagina for 6 weeks after your surgery or as told by your health care provider. This includes tampons and douches. °· Do not have sex until your health care provider says you can. °· Do not take baths, swim, or use a hot tub until your health care provider approves. °· Drink enough fluid to keep your urine clear or pale yellow. °· Do not drive for 24 hours if you were given a sedative. °· Keep all follow-up visits as told by your health  care provider. This is important. °Contact a health care provider if: °· Your pain medicine is not helping. °· You have a fever. °· You have redness, swelling, or pain at your incision site. °· You have blood, pus, or a bad-smelling discharge from your vagina. °· You continue to have difficulty urinating. °Get help right away if: °· You have severe abdominal or back pain. °· You have heavy bleeding from your vagina. °· You have chest pain or shortness of breath. °This information is not intended to replace advice given to you by your health care provider. Make sure you discuss any questions you have with your health care provider. °Document Released: 10/10/2015 Document Revised: 11/24/2015 Document Reviewed: 07/03/2015 °Elsevier Interactive Patient Education © 2018 [employer] ° °AMBULATORY SURGERY  °DISCHARGE INSTRUCTIONS ° ° °1) The drugs that you were given will stay in your system until tomorrow so for the next 24 hours you should not: ° °A) Drive an automobile °B) Make any legal decisions °C) Drink any alcoholic beverage ° ° °2) You may resume regular meals tomorrow.  Today it is better to start with liquids and gradually work up to solid foods. ° °You may eat anything you prefer, but it is better to start with liquids, then soup and crackers, and gradually work up to solid foods. ° ° °3) Please notify your doctor immediately if you have any unusual bleeding, trouble breathing, redness and pain at the surgery site, drainage, fever, or pain not relieved by medication. ° ° ° °4) Additional Instructions: ° ° ° ° ° ° ° °  Please contact your physician with any problems or Same Day Surgery at [phone], Monday through Friday 6 am to 4 pm, or [place] [facility] number at [phone]. ° °

## 2018-03-13 NOTE — Anesthesia Post-op Follow-up Note (Signed)
Anesthesia QCDR form completed.        

## 2018-03-13 NOTE — Anesthesia Procedure Notes (Signed)
Procedure Name: Intubation Date/Time: 03/13/2018 3:27 PM Performed by: Allean Found, CRNA Pre-anesthesia Checklist: Patient identified, Patient being monitored, Timeout performed, Emergency Drugs available and Suction available Patient Re-evaluated:Patient Re-evaluated prior to induction Oxygen Delivery Method: Circle system utilized Preoxygenation: Pre-oxygenation with 100% oxygen Induction Type: IV induction Ventilation: Mask ventilation without difficulty Laryngoscope Size: McGraph and 3 Grade View: Grade II Tube type: Oral Tube size: 7.0 mm Number of attempts: 1 Airway Equipment and Method: Stylet Placement Confirmation: ETT inserted through vocal cords under direct vision,  positive ETCO2 and breath sounds checked- equal and bilateral Secured at: 21 cm Tube secured with: Tape Dental Injury: Teeth and Oropharynx as per pre-operative assessment

## 2018-03-13 NOTE — Anesthesia Postprocedure Evaluation (Signed)
Anesthesia Post Note  Patient: Maria [name]  Procedure(s) Performed: HYSTERECTOMY VAGINAL (N/A ) ANTERIOR REPAIR (N/A )  Patient location during evaluation: PACU Anesthesia Type: General Level of consciousness: awake and alert Pain management: pain level controlled Vital Signs Assessment: post-procedure vital signs reviewed and stable Respiratory status: spontaneous breathing, nonlabored ventilation, respiratory function stable and patient connected to nasal cannula oxygen Cardiovascular status: blood pressure returned to baseline and stable Postop Assessment: no apparent nausea or vomiting Anesthetic complications: no     Last Vitals:  Vitals:   03/13/18 1822 03/13/18 1837  BP:  120/75  Pulse: 78 73  Resp: 16 (!) 24  Temp:    SpO2: 98% 98%    Last Pain:  Vitals:   03/13/18 1837  TempSrc:   PainSc: [place]

## 2018-03-14 ENCOUNTER — Encounter: Payer: Self-pay | Admitting: Obstetrics & Gynecology

## 2018-03-17 LAB — SURGICAL PATHOLOGY

## 2018-03-18 NOTE — Telephone Encounter (Signed)
This encounter was created in error - please disregard.

## 2018-03-28 ENCOUNTER — Ambulatory Visit (INDEPENDENT_AMBULATORY_CARE_PROVIDER_SITE_OTHER): Payer: Commercial Managed Care - PPO | Admitting: Obstetrics & Gynecology

## 2018-03-28 ENCOUNTER — Encounter: Payer: Self-pay | Admitting: Obstetrics & Gynecology

## 2018-03-28 VITALS — BP 130/80 | Ht 62.0 in | Wt 145.0 lb

## 2018-03-28 DIAGNOSIS — N814 Uterovaginal prolapse, unspecified: Secondary | ICD-10-CM

## 2018-03-28 NOTE — Progress Notes (Signed)
  Postoperative Follow-up Patient presents post op from vaginal hysterectomy and anterior colporrhaphy for pelvic relaxation, 2 weeks ago.  Subjective: Patient reports marked improvement in her preop symptoms. Eating a regular diet without difficulty. The patient is not having any pain.  Activity: sedentary. Patient reports additional symptom's since surgery of worsening stress urinary incontinence w cough or sneeze.  Objective: BP 130/80   Ht 5\' 2"  (1.575 m)   Wt 145 lb (65.8 kg)   LMP 02/24/2018 (Approximate) Comment: spotting   BMI 26.52 kg/m  Physical Exam  Constitutional: She is oriented to person, place, and time. She appears well-developed and well-nourished. No distress.  Cardiovascular: Normal rate.  Pulmonary/Chest: Effort normal.  Abdominal: Soft. She exhibits no distension. There is no tenderness.  Musculoskeletal: Normal range of motion.  Neurological: She is alert and oriented to person, place, and time. No cranial nerve deficit.  Skin: Skin is warm and dry.  Psychiatric: She has a normal mood and affect.   Assessment: s/p :  vaginal hysterectomy and anterior colporrhaphy progressing well  Plan: Patient has done well after surgery with no apparent complications.  I have discussed the post-operative course to date, and the expected progress moving forward.  The patient understands what complications to be concerned about.  I will see the patient in routine follow up, or sooner if needed.    Activity plan: No heavy lifting. Pelvic rest. Work note for no lifting >5 lbs until 10/24 or beyond. Kegel exercise for future prevention of cystocele relapse advised. Monitor GSI for now  Hoyt Koch 03/28/2018, 10:34 AM

## 2018-04-16 ENCOUNTER — Other Ambulatory Visit: Payer: Self-pay | Admitting: Obstetrics & Gynecology

## 2018-04-16 ENCOUNTER — Telehealth: Payer: Self-pay

## 2018-04-16 MED ORDER — PROMETHAZINE HCL 25 MG RE SUPP: 25.0000 mg | Freq: Four times a day (QID) | RECTAL | 0 refills | Status: DC | PRN

## 2018-04-16 NOTE — Telephone Encounter (Signed)
Appt Fri or sooner if needed.  Will eRx Phenergan.  ER if incontrollable nausea/vomiting or pain.

## 2018-04-16 NOTE — Telephone Encounter (Signed)
Pt calling states she is p/o pt of [facility]. Having a lot of nausea and having trouble keeping anything down since surgery. Wondering if [place] can send in something for this? Please advise

## 2018-04-18 ENCOUNTER — Telehealth: Payer: Self-pay | Admitting: Obstetrics & Gynecology

## 2018-04-18 ENCOUNTER — Other Ambulatory Visit: Payer: Self-pay | Admitting: Obstetrics & Gynecology

## 2018-04-18 ENCOUNTER — Encounter: Payer: Self-pay | Admitting: Obstetrics & Gynecology

## 2018-04-18 ENCOUNTER — Ambulatory Visit (INDEPENDENT_AMBULATORY_CARE_PROVIDER_SITE_OTHER): Payer: Commercial Managed Care - PPO | Admitting: Obstetrics & Gynecology

## 2018-04-18 VITALS — BP 120/80 | Ht 62.0 in | Wt 138.0 lb

## 2018-04-18 DIAGNOSIS — Z9889 Other specified postprocedural states: Secondary | ICD-10-CM

## 2018-04-18 DIAGNOSIS — R112 Nausea with vomiting, unspecified: Secondary | ICD-10-CM

## 2018-04-18 MED ORDER — RANITIDINE HCL 150 MG PO TABS: 150.0000 mg | ORAL_TABLET | Freq: Two times a day (BID) | ORAL | 1 refills | Status: DC

## 2018-04-18 MED ORDER — ESOMEPRAZOLE MAGNESIUM 40 MG PO CPDR: 40.0000 mg | DELAYED_RELEASE_CAPSULE | Freq: Every day | ORAL | 1 refills | Status: DC

## 2018-04-18 NOTE — Telephone Encounter (Signed)
Does she mean Nexium? WIll change to Zantac

## 2018-04-18 NOTE — Telephone Encounter (Signed)
Pt aware.

## 2018-04-18 NOTE — Progress Notes (Signed)
  Postoperative Follow-up Patient presents post op from vaginal hysterectomy for pelvic relaxation, 6 weeks ago.  Subjective: Patient reports marked improvement in her preop symptoms. Eating a regular diet with difficulty- she has nausea and vomiting most days, slight improvement over time, and pain is epigastric only.. The patient is not having any lower abd or pelvic pain.  Activity: normal activities of daily living. Patient reports additional symptom's since surgery of No bleeding.  Objective: BP 120/80   Ht 5\' 2"  (1.575 m)   Wt 138 lb (62.6 kg)   LMP 02/24/2018 (Approximate) Comment: spotting   BMI 25.24 kg/m  Physical Exam  Constitutional: She is oriented to person, place, and time. She appears well-developed and well-nourished. No distress.  Genitourinary: Rectum normal and vagina normal. Pelvic exam was performed with patient supine. There is no rash, tenderness or lesion on the right labia. There is no rash, tenderness or lesion on the left labia. No erythema or bleeding in the vagina. Right adnexum does not display mass and does not display tenderness. Left adnexum does not display mass and does not display tenderness.  Genitourinary Comments: Cervix and uterus absent. Vaginal cuff healing well.  Cardiovascular: Normal rate.  Pulmonary/Chest: Effort normal.  Abdominal: Soft. Bowel sounds are normal. She exhibits no distension. There is tenderness in the epigastric area. There is no rebound, no guarding, no tenderness at McBurney's point and negative Murphy's sign. No hernia.  No distension.  No suprapubic pain. Mild epigastric T to palpation.  Musculoskeletal: Normal range of motion.  Neurological: She is alert and oriented to person, place, and time. No cranial nerve deficit.  Skin: Skin is warm and dry.  Psychiatric: She has a normal mood and affect.   Assessment: s/p :  vaginal hysterectomy progressing well  Plan: Patient has done well after surgery with no apparent  complications.  I have discussed the post-operative course to date, and the expected progress moving forward.  The patient understands what complications to be concerned about.  I will see the patient in routine follow up, or sooner if needed.    Activity plan: No restriction.  Plan GI referral for GERD or ulcer disease eval Nexium started  MMG soon encouraged  Hoyt Koch 04/18/2018, 1:39 PM

## 2018-04-18 NOTE — Telephone Encounter (Signed)
Patient is calling about her prescription Magnesium that she was given today stating if is very expense and was wondering if there was another precription that would be less expensive. Please advise

## 2018-04-18 NOTE — Patient Instructions (Signed)
  Mammogram every year    Call [phone] to schedule at [facility]

## 2018-04-22 ENCOUNTER — Encounter: Payer: Self-pay | Admitting: Gastroenterology

## 2018-04-22 ENCOUNTER — Ambulatory Visit: Payer: Commercial Managed Care - PPO | Admitting: Gastroenterology

## 2018-04-22 DIAGNOSIS — R112 Nausea with vomiting, unspecified: Secondary | ICD-10-CM

## 2018-04-23 ENCOUNTER — Ambulatory Visit: Payer: Commercial Managed Care - PPO | Admitting: Obstetrics & Gynecology

## 2018-04-25 ENCOUNTER — Ambulatory Visit: Payer: Commercial Managed Care - PPO | Admitting: Obstetrics & Gynecology

## 2018-05-04 ENCOUNTER — Other Ambulatory Visit: Payer: Self-pay | Admitting: Primary Care

## 2018-05-04 DIAGNOSIS — R202 Paresthesia of skin: Secondary | ICD-10-CM

## 2018-05-06 NOTE — Telephone Encounter (Signed)
It appears as though she recently underwent hysterectomy, also referred to GI for potential ulcer. Will decline refill request for naproxen.

## 2018-05-06 NOTE — Telephone Encounter (Signed)
Last prescribed on 11/20/2017 Last office visit on 12/09/2017

## 2018-05-26 ENCOUNTER — Telehealth: Payer: Self-pay

## 2018-05-26 NOTE — Telephone Encounter (Signed)
Called pt to remind pt to schedule annual

## 2018-05-26 NOTE — Telephone Encounter (Signed)
-----   Message from [name], MD sent at 05/26/2018  8:46 AM EST ----- Regarding: MMG needed Received notice she has not received MMG yet as ordered at her Annual. Please check and encourage her to do this, and document conversation.

## 2018-06-05 ENCOUNTER — Other Ambulatory Visit: Payer: Self-pay | Admitting: *Deleted

## 2018-06-05 MED ORDER — FOLIC ACID 1 MG PO TABS: 1.0000 mg | ORAL_TABLET | Freq: Every day | ORAL | 1 refills | Status: DC

## 2018-09-15 ENCOUNTER — Ambulatory Visit: Payer: Commercial Managed Care - PPO | Admitting: Family Medicine

## 2018-09-15 ENCOUNTER — Encounter: Payer: Self-pay | Admitting: Family Medicine

## 2018-09-15 ENCOUNTER — Other Ambulatory Visit: Payer: Self-pay

## 2018-09-15 VITALS — BP 122/68 | HR 81 | Temp 98.4°F | Ht 62.75 in | Wt 142.0 lb

## 2018-09-15 DIAGNOSIS — J101 Influenza due to other identified influenza virus with other respiratory manifestations: Secondary | ICD-10-CM

## 2018-09-15 DIAGNOSIS — R509 Fever, unspecified: Secondary | ICD-10-CM

## 2018-09-15 DIAGNOSIS — R52 Pain, unspecified: Secondary | ICD-10-CM

## 2018-09-15 LAB — POC INFLUENZA A&B (BINAX/QUICKVUE)
Influenza A, POC: POSITIVE — AB
Influenza B, POC: NEGATIVE

## 2018-09-15 MED ORDER — OSELTAMIVIR PHOSPHATE 75 MG PO CAPS: 75.0000 mg | ORAL_CAPSULE | Freq: Two times a day (BID) | ORAL | 0 refills | Status: DC

## 2018-09-15 NOTE — Patient Instructions (Signed)
tamiflu sent to pharmacy  Influenza, Adult Influenza, more commonly known as "the flu," is a viral infection that mainly affects the respiratory tract. The respiratory tract includes organs that help you breathe, such as the lungs, nose, and throat. The flu causes many symptoms similar to the common cold along with high fever and body aches. The flu spreads easily from person to person (is contagious). Getting a flu shot (influenza vaccination) every year is the best way to prevent the flu. What are the causes? This condition is caused by the influenza virus. You can get the virus by:  Breathing in droplets that are in the air from an infected person's cough or sneeze.  Touching something that has been exposed to the virus (has been contaminated) and then touching your mouth, nose, or eyes. What increases the risk? The following factors may make you more likely to get the flu:  Not washing or sanitizing your hands often.  Having close contact with many people during cold and flu season.  Touching your mouth, eyes, or nose without first washing or sanitizing your hands.  Not getting a yearly (annual) flu shot. You may have a higher risk for the flu, including serious problems such as a lung infection (pneumonia), if you:  Are older than 65.  Are pregnant.  Have a weakened disease-fighting system (immune system). You may have a weakened immune system if you: ? Have HIV or AIDS. ? Are undergoing chemotherapy. ? Are taking medicines that reduce (suppress) the activity of your immune system.  Have a long-term (chronic) illness, such as heart disease, kidney disease, diabetes, or lung disease.  Have a liver disorder.  Are severely overweight (morbidly obese).  Have anemia. This is a condition that affects your red blood cells.  Have asthma. What are the signs or symptoms? Symptoms of this condition usually begin suddenly and last 4-14 days. They may include:  Fever and chills.   Headaches, body aches, or muscle aches.  Sore throat.  Cough.  Runny or stuffy (congested) nose.  Chest discomfort.  Poor appetite.  Weakness or fatigue.  Dizziness.  Nausea or vomiting. How is this diagnosed? This condition may be diagnosed based on:  Your symptoms and medical history.  A physical exam.  Swabbing your nose or throat and testing the fluid for the influenza virus. How is this treated? If the flu is diagnosed early, you can be treated with medicine that can help reduce how severe the illness is and how long it lasts (antiviral medicine). This may be given by mouth (orally) or through an IV. Taking care of yourself at home can help relieve symptoms. Your health care provider may recommend:  Taking over-the-counter medicines.  Drinking plenty of fluids. In many cases, the flu goes away on its own. If you have severe symptoms or complications, you may be treated in a hospital. Follow these instructions at home: Activity  Rest as needed and get plenty of sleep.  Stay home from work or school as told by your health care provider. Unless you are visiting your health care provider, avoid leaving home until your fever has been gone for 24 hours without taking medicine. Eating and drinking  Take an oral rehydration solution (ORS). This is a drink that is sold at pharmacies and retail stores.  Drink enough fluid to keep your urine pale yellow.  Drink clear fluids in small amounts as you are able. Clear fluids include water, ice chips, diluted fruit juice, and low-calorie sports drinks.  Eat bland, easy-to-digest foods in small amounts as you are able. These foods include bananas, applesauce, rice, lean meats, toast, and crackers.  Avoid drinking fluids that contain a lot of sugar or caffeine, such as energy drinks, regular sports drinks, and soda.  Avoid alcohol.  Avoid spicy or fatty foods. General instructions      Take over-the-counter and  prescription medicines only as told by your health care provider.  Use a cool mist humidifier to add humidity to the air in your home. This can make it easier to breathe.  Cover your mouth and nose when you cough or sneeze.  Wash your hands with soap and water often, especially after you cough or sneeze. If soap and water are not available, use alcohol-based hand sanitizer.  Keep all follow-up visits as told by your health care provider. This is important. How is this prevented?   Get an annual flu shot. You may get the flu shot in late summer, fall, or winter. Ask your health care provider when you should get your flu shot.  Avoid contact with people who are sick during cold and flu season. This is generally fall and winter. Contact a health care provider if:  You develop new symptoms.  You have: ? Chest pain. ? Diarrhea. ? A fever.  Your cough gets worse.  You produce more mucus.  You feel nauseous or you vomit. Get help right away if:  You develop shortness of breath or difficulty breathing.  Your skin or nails turn a bluish color.  You have severe pain or stiffness in your neck.  You develop a sudden headache or sudden pain in your face or ear.  You cannot eat or drink without vomiting. Summary  Influenza, more commonly known as "the flu," is a viral infection that primarily affects your respiratory tract.  Symptoms of the flu usually begin suddenly and last 4-14 days.  Getting an annual flu shot is the best way to prevent getting the flu.  Stay home from work or school as told by your health care provider. Unless you are visiting your health care provider, avoid leaving home until your fever has been gone for 24 hours without taking medicine.  Keep all follow-up visits as told by your health care provider. This is important. This information is not intended to replace advice given to you by your health care provider. Make sure you discuss any questions you have  with your health care provider. Document Released: 06/15/2000 Document Revised: 12/04/2017 Document Reviewed: 12/04/2017 Elsevier Interactive Patient Education  2019 [employer].

## 2018-09-15 NOTE — Assessment & Plan Note (Signed)
Flu swab faintly positive for A which is what husband had. tamiflu pros/cons discussed, desires trial of this which was sent in. Further supportive care reviewed - fluids, rest, NSAID prn. Update if not improving with treatment.

## 2018-09-15 NOTE — Progress Notes (Signed)
BP 122/68 (BP Location: Left Arm, Patient Position: Sitting, Cuff Size: Normal)   Pulse 81   Temp 98.4 F (36.9 C) (Oral)   Ht 5' 2.75" (1.594 m)   Wt 142 lb (64.4 kg)   LMP 02/24/2018 (Approximate) Comment: spotting   SpO2 97%   BMI 25.36 kg/m    CC: flu exposure "not feeling great" Subjective:    Patient ID: Maria [name], female    DOB: Jun 25, 1971, 48 y.o.   MRN: [id number]  HPI: Maria [name] is a 48 y.o. female presenting on 09/15/2018 for Fever (C/o fever- max 101,cough, body aches and fatigue. Marland Kitchen Sxs started 3 days ago. Husband was dx with flu. )   3d h/o productive cough, head > chest congestion, fatigue, no appetite, progressively worsening. Felt feverish today with sweats. Muscle and body aches.   No documented fevers/chills. No ear or tooth pain, dyspnea, wheezing, ST, PNdrainage, HA.   Treating with mucinex, dayquil, decongestant.   Husband dx with flu A several days ago.  No h/o asthma.   Current smoker.      Relevant past medical, surgical, family and social history reviewed and updated as indicated. Interim medical history since our last visit reviewed. Allergies and medications reviewed and updated. Outpatient Medications Prior to Visit  Medication Sig Dispense Refill  . calcium carbonate (TUMS - DOSED IN MG ELEMENTAL CALCIUM) 500 MG chewable tablet Chew 1 tablet by mouth as needed for indigestion or heartburn.    . folic acid (FOLVITE) 1 MG tablet Take 1 tablet (1 mg total) by mouth daily. 90 tablet 1  . ibuprofen (ADVIL,MOTRIN) 200 MG tablet Take 400 mg by mouth every 6 (six) hours as needed for headache or moderate pain.    . naproxen sodium (ALEVE) 220 MG tablet Take 440 mg by mouth daily as needed (for pain or headache).    . nicotine (NICODERM CQ) 7 mg/24hr patch Place 1 patch (7 mg total) onto the skin daily. (Patient not taking: Reported on 04/18/2018) 28 patch 0  . promethazine (PHENERGAN) 25 MG suppository Place 1 suppository (25 mg total) rectally  every 6 (six) hours as needed for nausea or vomiting. 12 each 0  . ranitidine (ZANTAC) 150 MG tablet Take 1 tablet (150 mg total) by mouth 2 (two) times daily. 60 tablet 1  . vitamin B-12 (CYANOCOBALAMIN) 500 MCG tablet Take 500 mcg by mouth daily.      No facility-administered medications prior to visit.      Per HPI unless specifically indicated in ROS section below Review of Systems Objective:    BP 122/68 (BP Location: Left Arm, Patient Position: Sitting, Cuff Size: Normal)   Pulse 81   Temp 98.4 F (36.9 C) (Oral)   Ht 5' 2.75" (1.594 m)   Wt 142 lb (64.4 kg)   LMP 02/24/2018 (Approximate) Comment: spotting   SpO2 97%   BMI 25.36 kg/m   Wt Readings from Last 3 Encounters:  09/15/18 142 lb (64.4 kg)  04/18/18 138 lb (62.6 kg)  03/28/18 145 lb (65.8 kg)    Physical Exam Vitals signs and nursing note reviewed.  Constitutional:      General: She is not in acute distress.    Appearance: Normal appearance. She is well-developed.  HENT:     Head: Normocephalic and atraumatic.     Right Ear: Hearing, tympanic membrane, ear canal and external ear normal.     Left Ear: Hearing, tympanic membrane, ear canal and external ear normal.  Nose: Mucosal edema (and erythema) and congestion present. No rhinorrhea.     Right Sinus: No maxillary sinus tenderness or frontal sinus tenderness.     Left Sinus: No maxillary sinus tenderness or frontal sinus tenderness.     Mouth/Throat:     Mouth: Mucous membranes are moist.     Tongue: Lesions present.     Pharynx: Uvula midline. Posterior oropharyngeal erythema present. No oropharyngeal exudate.     Tonsils: No tonsillar abscesses.     Comments: Geographic tongue Eyes:     General: No scleral icterus.    Conjunctiva/sclera: Conjunctivae normal.     Pupils: Pupils are equal, round, and reactive to light.  Neck:     Musculoskeletal: Normal range of motion and neck supple.  Cardiovascular:     Rate and Rhythm: Normal rate and regular  rhythm.     Pulses: Normal pulses.     Heart sounds: Normal heart sounds. No murmur.  Pulmonary:     Effort: Pulmonary effort is normal. No respiratory distress.     Breath sounds: Normal breath sounds. No wheezing, rhonchi or rales.     Comments: Lungs clear Lymphadenopathy:     Cervical: No cervical adenopathy.  Skin:    General: Skin is warm and dry.     Findings: No rash.  Neurological:     Mental Status: She is alert.       Results for orders placed or performed in visit on 09/15/18  POC Influenza A&B(BINAX/QUICKVUE)  Result Value Ref Range   Influenza A, POC Positive (A) Negative   Influenza B, POC Negative Negative   Assessment & Plan:   Problem List Items Addressed This Visit    Influenza A - Primary    Flu swab faintly positive for A which is what husband had. tamiflu pros/cons discussed, desires trial of this which was sent in. Further supportive care reviewed - fluids, rest, NSAID prn. Update if not improving with treatment.       Relevant Medications   oseltamivir (TAMIFLU) 75 MG capsule    Other Visit Diagnoses    Fever, unspecified fever cause       Relevant Orders   POC Influenza A&B(BINAX/QUICKVUE) (Completed)   Generalized body aches       Relevant Orders   POC Influenza A&B(BINAX/QUICKVUE) (Completed)       Meds ordered this encounter  Medications  . oseltamivir (TAMIFLU) 75 MG capsule    Sig: Take 1 capsule (75 mg total) by mouth 2 (two) times daily.    Dispense:  10 capsule    Refill:  0   Orders Placed This Encounter  Procedures  . POC Influenza A&B(BINAX/QUICKVUE)    Follow up plan: Return if symptoms worsen or fail to improve.  [name], MD

## 2018-09-29 ENCOUNTER — Other Ambulatory Visit: Payer: Self-pay | Admitting: Obstetrics and Gynecology

## 2018-09-29 ENCOUNTER — Telehealth: Payer: Self-pay

## 2018-09-29 DIAGNOSIS — N3000 Acute cystitis without hematuria: Secondary | ICD-10-CM

## 2018-09-29 MED ORDER — NITROFURANTOIN MONOHYD MACRO 100 MG PO CAPS: 100.0000 mg | ORAL_CAPSULE | Freq: Two times a day (BID) | ORAL | 0 refills | Status: AC

## 2018-09-29 NOTE — Telephone Encounter (Signed)
Sent rx for macrobid to pharmacy. Called patient, left message.

## 2018-09-29 NOTE — Telephone Encounter (Signed)
Pt calling triage line stating she has a uti, could something be sent in to the pharmacy. Please advise

## 2018-10-13 ENCOUNTER — Other Ambulatory Visit: Payer: Self-pay | Admitting: Obstetrics & Gynecology

## 2018-11-11 ENCOUNTER — Encounter: Payer: Self-pay | Admitting: Primary Care

## 2018-11-11 ENCOUNTER — Ambulatory Visit (INDEPENDENT_AMBULATORY_CARE_PROVIDER_SITE_OTHER): Payer: Commercial Managed Care - PPO | Admitting: Primary Care

## 2018-11-11 VITALS — Wt 130.0 lb

## 2018-11-11 DIAGNOSIS — G629 Polyneuropathy, unspecified: Secondary | ICD-10-CM | POA: Diagnosis not present

## 2018-11-11 DIAGNOSIS — Z91030 Bee allergy status: Secondary | ICD-10-CM | POA: Diagnosis not present

## 2018-11-11 DIAGNOSIS — R202 Paresthesia of skin: Secondary | ICD-10-CM

## 2018-11-11 DIAGNOSIS — E538 Deficiency of other specified B group vitamins: Secondary | ICD-10-CM

## 2018-11-11 LAB — TSH: TSH: 0.86 u[IU]/mL (ref 0.35–4.50)

## 2018-11-11 LAB — BASIC METABOLIC PANEL
BUN: 13 mg/dL (ref 6–23)
CO2: 26 mEq/L (ref 19–32)
Calcium: 9.7 mg/dL (ref 8.4–10.5)
Chloride: 104 mEq/L (ref 96–112)
Creatinine, Ser: 0.67 mg/dL (ref 0.40–1.20)
GFR: 93.88 mL/min (ref >60.00)
Glucose, Bld: 98 mg/dL (ref 70–99)
Potassium: 4.7 mEq/L (ref 3.5–5.1)
Sodium: 139 mEq/L (ref 135–145)

## 2018-11-11 LAB — CBC
HCT: 43.1 % (ref 36.0–46.0)
Hemoglobin: 15.2 g/dL — ABNORMAL HIGH (ref 12.0–15.0)
MCHC: 35.4 g/dL (ref 30.0–36.0)
MCV: 106.1 fl — ABNORMAL HIGH (ref 78.0–100.0)
Platelets: 129 10*3/uL — ABNORMAL LOW (ref 150.0–400.0)
RBC: 4.06 Mil/uL (ref 3.87–5.11)
RDW: 13.2 % (ref 11.5–15.5)
WBC: 6.5 10*3/uL (ref 4.0–10.5)

## 2018-11-11 LAB — VITAMIN B12: Vitamin B-12: 495 pg/mL (ref 211–911)

## 2018-11-11 LAB — VITAMIN D 25 HYDROXY (VIT D DEFICIENCY, FRACTURES): VITD: 29.7 ng/mL — ABNORMAL LOW (ref 30.00–100.00)

## 2018-11-11 LAB — HEMOGLOBIN A1C: Hgb A1c MFr Bld: 5.6 % (ref 4.6–6.5)

## 2018-11-11 MED ORDER — EPINEPHRINE 0.3 MG/0.3ML IJ SOAJ: 0.3000 mg | INTRAMUSCULAR | 0 refills | Status: DC | PRN

## 2018-11-11 MED ORDER — HYDROXYZINE HCL 10 MG PO TABS: 10.0000 mg | ORAL_TABLET | Freq: Three times a day (TID) | ORAL | 0 refills | Status: DC | PRN

## 2018-11-11 NOTE — Assessment & Plan Note (Signed)
Rx for Epi Pen and hydroxyzine sent to pharmacy.

## 2018-11-11 NOTE — Addendum Note (Signed)
Addended by: Ellamae Sia on: 11/11/2018 11:26 AM   Modules accepted: Orders

## 2018-11-11 NOTE — Assessment & Plan Note (Signed)
Suspect symptoms are secondary to neuropathy, but given that she is a smoker we need to consider arterial involvement. She doesn't seem to exhibit signs of claudication.   Check labs today including TSH, CBC, B12, BMP.  Consider vascular consult. Consider gabapentin HS. Await labs.

## 2018-11-11 NOTE — Patient Instructions (Signed)
Call the office to schedule a lab only appointment as discussed.  Use the Epi Pen if needed for anaphylaxis.  Use the hydroxyzine as needed.  Purchase some extra insoles for your shoes to help with support.   It was a pleasure to see you today! [name], NP-C

## 2018-11-11 NOTE — Progress Notes (Signed)
Subjective:    Patient ID: Maria [name], female    DOB: 1970-08-31, 48 y.o.   MRN: [id number]  HPI  Virtual Visit via Video Note  I connected with Maria [name] on 11/11/18 at  8:00 AM EDT by a video enabled telemedicine application and verified that I am speaking with the correct person using two identifiers.  Location: Patient: Home Provider: Office   I discussed the limitations of evaluation and management by telemedicine and the availability of in person appointments. The patient expressed understanding and agreed to proceed.  History of Present Illness:  Maria [name] is a 48 year old female with a history of hypertension, peripheral neuropathy, paraesthesia who presents today with a chief complaint of lower extremity cramping and fatigue. She is also needing a new Epi Pen and a refill of her hydroxyzine for which she uses for her allergy to bee stings.   1) Peripheral Neuropathy: Her numbness and tingling is intermittent, and is located to the bilateral lower extremities distal to her knees down to her dorsal and plantar feet. This began about 6 weeks ago. She works in Medical sales representative at [employer] and is on her feet for about 10 hour at a time standing on concrete floors. She will switch hours from working day to evenings often. She will sometimes wake up in the morning and doesn't feel her feet on the floor when getting out of bed. She will occasionally experience discomfort during the night that will wake her from sleep.   She was evaluated for this in May 2019 and again in July 2019, underwent extensive lab testing and was found to have low B12 and Folate. Other labs grossly unremarkable. She was initiated on oral B12 and Folate.  She is currently smoking, smokes 1 pack per day. She is taking Tylenol every morning after breakfast, thinks this helps. She denies calf swelling, color changes to the skin, pain with ambulation that is relieved with rest. She does not take a multi-vitamin or  other OTC vitamins regularly.   2) Fatigue: Chronic. Underwent hysterectomy in September 2019. Endorses sleeping well at night. She does do shift work and will often switch from days to evenings.   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 She does not have an updated EpiPen. She will typically take hydroxyzine with improvement, is requesting a refill today.   Observations/Objective:  Alert and oriented. Appears well, not sickly. No distress. Speaking in complete sentences.   Assessment and Plan:  See problem based charting.  Follow Up Instructions:  Call the office to schedule a lab only appointment as discussed.  Use the Epi Pen if needed for anaphylaxis.  Use the hydroxyzine as needed.  Purchase some extra insoles for your shoes to help with support.   It was a pleasure to see you today! [name], NP-C    I discussed the assessment and treatment plan with the patient. The patient was provided an opportunity to ask questions and all were answered. The patient agreed with the plan and demonstrated an understanding of the instructions.   The patient was advised to call back or seek an in-person evaluation if the symptoms worsen or if the condition fails to improve as anticipated.    [name], NP    Review of Systems  Respiratory: Negative for shortness of breath.   Cardiovascular: Negative for chest pain.  Musculoskeletal: Negative for arthralgias.  Skin: Negative for color change.  Neurological:  Positive for numbness. Negative for weakness.       Past Medical History:  Diagnosis Date  . Anemia   . Anxiety    h/o  . Depression    h/o  . Family history of adverse reaction to anesthesia    mom-hard time waking up  . GERD (gastroesophageal reflux disease)    tums prn  . Hypertension    WAS PUT ON BP MED BY PCP LAST YEAR (2018) AND BP MED MADE PT SICK SO SHE STOPPED TAKING IT-PCP  MONITORS BP NOW  . Pancreatitis      Social History   Socioeconomic History  . Marital status: Married    Spouse name: Not on file  . Number of children: Not on file  . Years of education: Not on file  . Highest education level: Not on file  Occupational History  . Not on file  Social Needs  . Financial resource strain: Not on file  . Food insecurity:    Worry: Not on file    Inability: Not on file  . Transportation needs:    Medical: Not on file    Non-medical: Not on file  Tobacco Use  . Smoking status: Current Every Day Smoker    Packs/day: 1.00    Years: 20.00    Pack years: 20.00    Types: Cigarettes  . Smokeless tobacco: Never Used  Substance and Sexual Activity  . Alcohol use: Yes    Alcohol/week: 2.0 standard drinks    Types: 2 Glasses of wine per week    Comment: two glasses wine/beer every other day  . Drug use: No  . Sexual activity: Yes  Lifestyle  . Physical activity:    Days per week: Not on file    Minutes per session: Not on file  . Stress: Not on file  Relationships  . Social connections:    Talks on phone: Not on file    Gets together: Not on file    Attends religious service: Not on file    Active member of club or organization: Not on file    Attends meetings of clubs or organizations: Not on file    Relationship status: Not on file  . Intimate partner violence:    Fear of current or ex partner: Not on file    Emotionally abused: Not on file    Physically abused: Not on file    Forced sexual activity: Not on file  Other Topics Concern  . Not on file  Social History Narrative   Married.   1 child.    Works at [employer].   Enjoys swimming, camping    Past Surgical History:  Procedure Laterality Date  . ANTERIOR AND POSTERIOR REPAIR WITH SACROSPINOUS FIXATION N/A 03/13/2018   Procedure: ANTERIOR REPAIR;  Surgeon: [name], MD;  Location: [facility];  Service: Gynecology;  Laterality: N/A;  . INDUCED ABORTION     x2  . VAGINAL  HYSTERECTOMY N/A 03/13/2018   Procedure: HYSTERECTOMY VAGINAL;  Surgeon: [name], MD;  Location: [facility];  Service: Gynecology;  Laterality: N/A;    Family History  Problem Relation Age of Onset  . Arthritis Mother   . Arthritis Father   . Alcohol abuse Father   . Hypertension Father   . Heart disease Father   . Aneurysm Father   . Breast cancer Maternal Grandmother   . Breast cancer Paternal Grandmother     Allergies  Allergen Reactions  . Bee Venom  Swelling  . Codeine Swelling  . Bupropion Anxiety and Other (See Comments)    Worsened anxiety    No current outpatient medications on file prior to visit.   No current facility-administered medications on file prior to visit.     Wt 130 lb (59 kg)   LMP 02/24/2018 (Approximate) Comment: spotting   BMI 23.21 kg/m    Objective:   Physical Exam  Constitutional: She is oriented to person, place, and time. She appears well-nourished.  Respiratory: Effort normal.  Neurological: She is alert and oriented to person, place, and time.  Psychiatric: She has a normal mood and affect.           Assessment & Plan:

## 2018-11-11 NOTE — Assessment & Plan Note (Signed)
Not taking B12 regularly.  Work up for pernicious anemia negative in 2019. Repeat B12 pending.

## 2018-11-12 ENCOUNTER — Other Ambulatory Visit: Payer: Commercial Managed Care - PPO

## 2018-11-14 ENCOUNTER — Other Ambulatory Visit: Payer: Self-pay | Admitting: Primary Care

## 2018-11-14 ENCOUNTER — Telehealth: Payer: Self-pay | Admitting: Primary Care

## 2018-11-14 ENCOUNTER — Encounter: Payer: Self-pay | Admitting: *Deleted

## 2018-11-14 DIAGNOSIS — M79604 Pain in right leg: Secondary | ICD-10-CM

## 2018-11-14 DIAGNOSIS — R202 Paresthesia of skin: Secondary | ICD-10-CM

## 2018-11-14 DIAGNOSIS — M79605 Pain in left leg: Secondary | ICD-10-CM

## 2018-11-14 MED ORDER — GABAPENTIN 100 MG PO CAPS: ORAL_CAPSULE | ORAL | 0 refills | Status: DC

## 2018-11-14 NOTE — Telephone Encounter (Signed)
Copied from [place] [phone]. Topic: General - Other >> Nov 13, 2018  4:44 PM Nils Flack wrote: Pt is returning call to [place] . Please call [phone]

## 2018-11-14 NOTE — Telephone Encounter (Signed)
Best number [phone]  Pt needs a copy of her last labs  Please advise when ready for pick up  Pt needs asap for work

## 2018-11-14 NOTE — Telephone Encounter (Signed)
Spoken to patient and place lab results for her to pick up in the front office. Patient requested a work note stating that she is excuse from since she was seen last week. Per Maria [name], okay to write note for to excuse patient from work on 11/14/2018.

## 2018-11-14 NOTE — Telephone Encounter (Signed)
Spoken and notified patient of Maria [name] comments in the results note. Patient verbalized understanding.

## 2018-12-08 ENCOUNTER — Ambulatory Visit: Payer: Commercial Managed Care - PPO | Admitting: Family Medicine

## 2018-12-08 ENCOUNTER — Encounter: Payer: Self-pay | Admitting: Family Medicine

## 2018-12-08 ENCOUNTER — Other Ambulatory Visit: Payer: Self-pay

## 2018-12-08 ENCOUNTER — Ambulatory Visit: Payer: Commercial Managed Care - PPO

## 2018-12-08 VITALS — BP 150/62 | HR 58 | Temp 97.8°F | Resp 16 | Ht 62.25 in | Wt 135.5 lb

## 2018-12-08 DIAGNOSIS — Z91030 Bee allergy status: Secondary | ICD-10-CM

## 2018-12-08 DIAGNOSIS — W57XXXA Bitten or stung by nonvenomous insect and other nonvenomous arthropods, initial encounter: Secondary | ICD-10-CM | POA: Diagnosis not present

## 2018-12-08 DIAGNOSIS — I10 Essential (primary) hypertension: Secondary | ICD-10-CM | POA: Diagnosis not present

## 2018-12-08 MED ORDER — TRIAMCINOLONE ACETONIDE 0.1 % EX CREA: 1.0000 "application " | TOPICAL_CREAM | Freq: Two times a day (BID) | CUTANEOUS | 0 refills | Status: DC

## 2018-12-08 MED ORDER — HYDROXYZINE HCL 10 MG PO TABS: 10.0000 mg | ORAL_TABLET | Freq: Three times a day (TID) | ORAL | 0 refills | Status: DC | PRN

## 2018-12-08 NOTE — Progress Notes (Signed)
Subjective:     Maria [name] is a 48 y.o. female presenting for Skin Problem (red area on lower left arm. Noticed on 12/04/2018. getting worse. No fever. )     HPI   #Skin rash - hands in the shrubs and pulling weeds - then itchy with a small bug bite - did cortisone cream and hydroxyzine for the itch - just itchy - no pain - no other lesion - not sure what she was bit by - did blister initially - using a cream and bandaid to avoid scratching - no fever/chills - happened 4 days ago  #HTN - is able to check at home - was high and on meds in the past, but stopped - no cp, sob, ha, dizziness   Review of Systems  See HPI   Social History   Tobacco Use  Smoking Status Current Every Day Smoker  . Packs/day: 1.00  . Years: 20.00  . Pack years: 20.00  . Types: Cigarettes  Smokeless Tobacco Never Used        Objective:    BP Readings from Last 3 Encounters:  12/08/18 (!) 150/62  09/15/18 122/68  04/18/18 120/80   Wt Readings from Last 3 Encounters:  12/08/18 135 lb 8 oz (61.5 kg)  11/11/18 130 lb (59 kg)  09/15/18 142 lb (64.4 kg)    BP (!) 150/62   Pulse (!) 58   Temp 97.8 F (36.6 C)   Resp 16   Ht 5' 2.25" (1.581 m) Comment: checked today  Wt 135 lb 8 oz (61.5 kg)   LMP 02/24/2018 (Approximate) Comment: spotting   SpO2 98%   BMI 24.58 kg/m    Physical Exam Constitutional:      General: She is not in acute distress.    Appearance: She is well-developed. She is not diaphoretic.  HENT:     Right Ear: External ear normal.     Left Ear: External ear normal.     Nose: Nose normal.  Eyes:     Conjunctiva/sclera: Conjunctivae normal.  Neck:     Musculoskeletal: Neck supple.  Cardiovascular:     Rate and Rhythm: Normal rate and regular rhythm.     Heart sounds: No murmur.  Pulmonary:     Effort: Pulmonary effort is normal. No respiratory distress.     Breath sounds: Normal breath sounds. No wheezing.  Skin:    General: Skin is warm and  dry.     Capillary Refill: Capillary refill takes less than 2 seconds.     Comments: Right forearm with dark red macule with cental ulcer and irritation.   Neurological:     Mental Status: She is alert. Mental status is at baseline.  Psychiatric:        Mood and Affect: Mood normal.        Behavior: Behavior normal.           Assessment & Plan:   Problem List Items Addressed This Visit      Cardiovascular and Mediastinum   Essential hypertension - Primary     Other   Bee sting allergy   Relevant Medications   hydrOXYzine (ATARAX/VISTARIL) 10 MG tablet    Other Visit Diagnoses    Bug bite, initial encounter       Relevant Medications   triamcinolone cream (KENALOG) 0.1 %   hydrOXYzine (ATARAX/VISTARIL) 10 MG tablet     Topical triamcinolone and refill of atarax if needed. Return precautions if redness worsening or develops fever/chills  Return if symptoms worsen or fail to improve.  Lesleigh Noe, MD

## 2018-12-08 NOTE — Patient Instructions (Addendum)
#  Bug Bite - I sent in a stronger steroid cream to try  - also refilled hydroxyzine if the cream is not working - if you develop worsening pain, swelling, redness, or fever/chills then call back - it may take 1-2 weeks to go away  #High Blood Pressure - Check your blood pressure at home - Normal blood pressure is <140/90 - if you are noticing that it is high, call to see [name], NP

## 2018-12-08 NOTE — Assessment & Plan Note (Signed)
BP elevated, not currently on medication. Advised home monitoring and to call PCP if bp elevated at home. Was normal in March, so suspect some white coat HTN.

## 2018-12-09 ENCOUNTER — Ambulatory Visit: Payer: Commercial Managed Care - PPO | Admitting: Primary Care

## 2018-12-12 ENCOUNTER — Other Ambulatory Visit: Payer: Self-pay | Admitting: Primary Care

## 2018-12-12 DIAGNOSIS — D696 Thrombocytopenia, unspecified: Secondary | ICD-10-CM

## 2018-12-14 ENCOUNTER — Other Ambulatory Visit: Payer: Self-pay | Admitting: Primary Care

## 2018-12-14 DIAGNOSIS — M79604 Pain in right leg: Secondary | ICD-10-CM

## 2018-12-14 DIAGNOSIS — R202 Paresthesia of skin: Secondary | ICD-10-CM

## 2018-12-15 ENCOUNTER — Telehealth: Payer: Self-pay

## 2018-12-15 NOTE — Telephone Encounter (Signed)
Left detailed VM w COVID screen and back door lab info   

## 2018-12-15 NOTE — Telephone Encounter (Signed)
How's her leg discomfort since we started the gabapentin medication? How much is she taking and how often? (Ex 1 capsule TID? 2 capsules HS?)

## 2018-12-15 NOTE — Telephone Encounter (Signed)
Last prescribed on 11/14/2018. Last appointment on 12/08/2018. Next future appointment on 12/18/2018

## 2018-12-16 ENCOUNTER — Other Ambulatory Visit: Payer: Commercial Managed Care - PPO

## 2018-12-17 ENCOUNTER — Telehealth: Payer: Self-pay

## 2018-12-17 NOTE — Telephone Encounter (Signed)
Message left for patient to return my call.  

## 2018-12-17 NOTE — Telephone Encounter (Signed)
Left detailed VM w COVID screen and back door  info

## 2018-12-18 ENCOUNTER — Other Ambulatory Visit: Payer: Self-pay

## 2018-12-18 ENCOUNTER — Other Ambulatory Visit (INDEPENDENT_AMBULATORY_CARE_PROVIDER_SITE_OTHER): Payer: Commercial Managed Care - PPO

## 2018-12-18 DIAGNOSIS — D696 Thrombocytopenia, unspecified: Secondary | ICD-10-CM | POA: Diagnosis not present

## 2018-12-18 LAB — CBC
HCT: 40 % (ref 36.0–46.0)
Hemoglobin: 14 g/dL (ref 12.0–15.0)
MCHC: 34.9 g/dL (ref 30.0–36.0)
MCV: 107.8 fl — ABNORMAL HIGH (ref 78.0–100.0)
Platelets: 141 10*3/uL — ABNORMAL LOW (ref 150.0–400.0)
RBC: 3.71 Mil/uL — ABNORMAL LOW (ref 3.87–5.11)
RDW: 13 % (ref 11.5–15.5)
WBC: 6.6 10*3/uL (ref 4.0–10.5)

## 2018-12-18 LAB — FOLATE: Folate: 4.7 ng/mL — ABNORMAL LOW (ref >5.9)

## 2018-12-22 NOTE — Telephone Encounter (Signed)
Spoken to patient and she stated no need for refill. She still have plenty.

## 2019-02-03 ENCOUNTER — Ambulatory Visit (INDEPENDENT_AMBULATORY_CARE_PROVIDER_SITE_OTHER): Payer: Commercial Managed Care - PPO | Admitting: Internal Medicine

## 2019-02-03 ENCOUNTER — Telehealth: Payer: Self-pay | Admitting: *Deleted

## 2019-02-03 ENCOUNTER — Encounter: Payer: Self-pay | Admitting: Internal Medicine

## 2019-02-03 DIAGNOSIS — M7989 Other specified soft tissue disorders: Secondary | ICD-10-CM | POA: Diagnosis not present

## 2019-02-03 DIAGNOSIS — S50861A Insect bite (nonvenomous) of right forearm, initial encounter: Secondary | ICD-10-CM | POA: Diagnosis not present

## 2019-02-03 DIAGNOSIS — W57XXXA Bitten or stung by nonvenomous insect and other nonvenomous arthropods, initial encounter: Secondary | ICD-10-CM | POA: Diagnosis not present

## 2019-02-03 DIAGNOSIS — S60562A Insect bite (nonvenomous) of left hand, initial encounter: Secondary | ICD-10-CM

## 2019-02-03 MED ORDER — PREDNISONE 10 MG PO TABS: ORAL_TABLET | ORAL | 0 refills | Status: DC

## 2019-02-03 NOTE — Telephone Encounter (Signed)
Ok for work note? 

## 2019-02-03 NOTE — Progress Notes (Signed)
Virtual Visit via Video Note  I connected with Maria [name] on 02/03/19 at 11:30 AM EDT by a video enabled telemedicine application and verified that I am speaking with the correct person using two identifiers.  Location: Patient: Home Provider: Office   I discussed the limitations of evaluation and management by telemedicine and the availability of in person appointments. The patient expressed understanding and agreed to proceed.  History of Present Illness:  Pt reports insect bites to left hand and right forearm. This occurred yesterday. She is not sure what she was bit by, but possibly ants. The bites itch and burn. She reports the hand swelling is uncomfortable. Her hands are stiff. She denies redness or warmth but has noticed some drainage from the bites. She has tried Benadryl and Hydroxyzine with minimal relief.   Past Medical History:  Diagnosis Date  . Anemia   . Anxiety    h/o  . Depression    h/o  . Family history of adverse reaction to anesthesia    mom-hard time waking up  . GERD (gastroesophageal reflux disease)    tums prn  . Hypertension    WAS PUT ON BP MED BY PCP LAST YEAR (2018) AND BP MED MADE PT SICK SO SHE STOPPED TAKING IT-PCP MONITORS BP NOW  . Pancreatitis     Current Outpatient Medications  Medication Sig Dispense Refill  . EPINEPHrine 0.3 mg/0.3 mL IJ SOAJ injection Inject 0.3 mLs (0.3 mg total) into the muscle as needed for anaphylaxis. 1 Device 0  . gabapentin (NEURONTIN) 100 MG capsule Take 1-3 capsules by mouth at bedtime for leg pain. 90 capsule 0  . hydrOXYzine (ATARAX/VISTARIL) 10 MG tablet Take 1-2 tablets (10-20 mg total) by mouth 3 (three) times daily as needed for itching. 20 tablet 0  . triamcinolone cream (KENALOG) 0.1 % Apply 1 application topically 2 (two) times daily. 30 g 0  . predniSONE (DELTASONE) 10 MG tablet Take 3 tabs on days 1-2, take 2 tabs on days 3-4, take 1 tab on days 5-6 12 tablet 0   No current facility-administered  medications for this visit.     Allergies  Allergen Reactions  . Bee Venom Swelling  . Codeine Swelling  . Bupropion Anxiety and Other (See Comments)    Worsened anxiety    Family History  Problem Relation Age of Onset  . Arthritis Mother   . Arthritis Father   . Alcohol abuse Father   . Hypertension Father   . Heart disease Father   . Aneurysm Father   . Breast cancer Maternal Grandmother   . Breast cancer Paternal Grandmother     Social History   Socioeconomic History  . Marital status: Married    Spouse name: Not on file  . Number of children: Not on file  . Years of education: Not on file  . Highest education level: Not on file  Occupational History  . Not on file  Social Needs  . Financial resource strain: Not on file  . Food insecurity    Worry: Not on file    Inability: Not on file  . Transportation needs    Medical: Not on file    Non-medical: Not on file  Tobacco Use  . Smoking status: Current Every Day Smoker    Packs/day: 1.00    Years: 20.00    Pack years: 20.00    Types: Cigarettes  . Smokeless tobacco: Never Used  Substance and Sexual Activity  . Alcohol use: Yes  Alcohol/week: 2.0 standard drinks    Types: 2 Glasses of wine per week    Comment: two glasses wine/beer every other day  . Drug use: No  . Sexual activity: Yes  Lifestyle  . Physical activity    Days per week: Not on file    Minutes per session: Not on file  . Stress: Not on file  Relationships  . Social Herbalist on phone: Not on file    Gets together: Not on file    Attends religious service: Not on file    Active member of club or organization: Not on file    Attends meetings of clubs or organizations: Not on file    Relationship status: Not on file  . Intimate partner violence    Fear of current or ex partner: Not on file    Emotionally abused: Not on file    Physically abused: Not on file    Forced sexual activity: Not on file  Other Topics Concern  .  Not on file  Social History Narrative   Married.   1 child.    Works at [employer].   Enjoys swimming, camping     Constitutional: Denies fever, malaise, fatigue, headache or abrupt weight changes.  Respiratory: Denies difficulty breathing, shortness of breath, cough or sputum production.   Cardiovascular: Denies chest pain, chest tightness, palpitations or swelling in the hands or feet.  Musculoskeletal: Pt reports swelling of bilateral hands. Denies decrease in range of motion, difficulty with gait, muscle pain.  Skin: Pt reports insect bites of left hand and right forearm.   No other specific complaints in a complete review of systems (except as listed in HPI above).  Observations/Objective:  LMP 02/24/2018 (Approximate) Comment: spotting  Wt Readings from Last 3 Encounters:  12/08/18 135 lb 8 oz (61.5 kg)  11/11/18 130 lb (59 kg)  09/15/18 142 lb (64.4 kg)    General: Appears her stated age, well developed, well nourished in NAD. Skin: Excoriation noted to left hand and right forearm. Pulmonary/Chest: Normal effort. No respiratory distress.  Musculoskeletal: 1+ swelling of bilateral hands.   Neurological: Alert and oriented.   BMET    Component Value Date/Time   NA 139 11/11/2018 1126   NA 135 (L) 04/26/2014 0947   K 4.7 11/11/2018 1126   K 2.7 (L) 04/26/2014 0947   CL 104 11/11/2018 1126   CL 95 (L) 04/26/2014 0947   CO2 26 11/11/2018 1126   CO2 30 04/26/2014 0947   GLUCOSE 98 11/11/2018 1126   GLUCOSE 138 (H) 04/26/2014 0947   BUN 13 11/11/2018 1126   BUN 14 04/26/2014 0947   CREATININE 0.67 11/11/2018 1126   CREATININE 0.77 04/26/2014 0947   CALCIUM 9.7 11/11/2018 1126   CALCIUM 9.9 04/26/2014 0947   GFRNONAA >90 04/27/2014 2349   GFRNONAA >60 04/26/2014 0947   GFRAA >90 04/27/2014 2349   GFRAA >60 04/26/2014 0947    Lipid Panel  No results found for: CHOL, TRIG, HDL, CHOLHDL, VLDL, LDLCALC  CBC    Component Value Date/Time   WBC 6.6 12/18/2018  0947   RBC 3.71 (L) 12/18/2018 0947   HGB 14.0 12/18/2018 0947   HGB 14.1 04/26/2014 0947   HCT 40.0 12/18/2018 0947   HCT 43.1 04/26/2014 0947   PLT 141.0 (L) 12/18/2018 0947   PLT 268 04/26/2014 0947   MCV 107.8 (H) 12/18/2018 0947   MCV 102 (H) 04/26/2014 0947   MCH 38.7 (H) 03/10/2018 1012  MCHC 34.9 12/18/2018 0947   RDW 13.0 12/18/2018 0947   RDW 20.1 (H) 04/26/2014 0947   LYMPHSABS 2.3 11/12/2017 1235   LYMPHSABS 1.3 04/26/2014 0947   MONOABS 0.4 11/12/2017 1235   MONOABS 0.8 04/26/2014 0947   EOSABS 0.1 11/12/2017 1235   EOSABS 0.0 04/26/2014 0947   BASOSABS 0.0 11/12/2017 1235   BASOSABS 0.0 04/26/2014 0947    Hgb A1C Lab Results  Component Value Date   HGBA1C 5.6 11/11/2018       Assessment and Plan:  Bilateral Hand Swelling secondary to Insect Bite:  No s/s of infection RX for Pred Taper x 6 days Ok to continue Benadryl and Hydroxyzine prn  Return precautions discussed  Follow Up Instructions:    I discussed the assessment and treatment plan with the patient. The patient was provided an opportunity to ask questions and all were answered. The patient agreed with the plan and demonstrated an understanding of the instructions.   The patient was advised to call back or seek an in-person evaluation if the symptoms worsen or if the condition fails to improve as anticipated.   Webb Silversmith, NP

## 2019-02-03 NOTE — Patient Instructions (Signed)

## 2019-02-03 NOTE — Telephone Encounter (Signed)
Patient left a voicemail stating that she did a virtual visit with Webb Silversmith NP. Patient stated that she needs a work note for today.

## 2019-02-05 NOTE — Telephone Encounter (Signed)
Work note placed in front office  Called pt, no answer and VM is full

## 2019-02-05 NOTE — Telephone Encounter (Signed)
Pt aware work note is up front

## 2019-03-23 ENCOUNTER — Ambulatory Visit (INDEPENDENT_AMBULATORY_CARE_PROVIDER_SITE_OTHER): Payer: Commercial Managed Care - PPO | Admitting: Family Medicine

## 2019-03-23 ENCOUNTER — Encounter: Payer: Self-pay | Admitting: Family Medicine

## 2019-03-23 ENCOUNTER — Other Ambulatory Visit: Payer: Self-pay

## 2019-03-23 VITALS — BP 162/94 | Temp 96.7°F

## 2019-03-23 DIAGNOSIS — R197 Diarrhea, unspecified: Secondary | ICD-10-CM | POA: Diagnosis not present

## 2019-03-23 DIAGNOSIS — G44209 Tension-type headache, unspecified, not intractable: Secondary | ICD-10-CM

## 2019-03-23 DIAGNOSIS — Z20822 Contact with and (suspected) exposure to covid-19: Secondary | ICD-10-CM

## 2019-03-23 NOTE — Progress Notes (Signed)
     [name] T. [name], MD Primary Care and Sports Medicine [facility] at [facility] [place], [address] Phone: [phone]  FAX: [place] - 48 y.o. female  MRN [id number]  Date of Birth: 01-21-1971  Visit Date: 03/23/2019  PCP: [name], NP  Referred by: [name], NP Chief Complaint  Patient presents with  . Headache  . Diarrhea   Virtual Visit via Video Note:  I connected with  Maria [name] on 03/23/2019  8:40 AM EDT by a video enabled telemedicine application and verified that I am speaking with the correct person using two identifiers.   Location patient: home computer, tablet, or smartphone Location provider: work or home office Consent: Verbal consent directly obtained from [employer]. Persons participating in the virtual visit: patient, provider  I discussed the limitations of evaluation and management by telemedicine and the availability of in person appointments. The patient expressed understanding and agreed to proceed.  History of Present Illness:  She has been having diarrhea, HA and without fever. Diarrhea about every 1-2 hours for 24 hours.  Feels better this AM and has had no exposures, known.  She works at [employer].  Review of Systems as above: See pertinent positives and pertinent negatives per HPI No acute distress verbally  Past Medical History, Surgical History, Social History, Family History, Problem List, Medications, and Allergies have been reviewed and updated if relevant.   Observations/Objective/Exam:  An attempt was made to discern vital signs over the phone and per patient if applicable and possible.   General:    Alert, Oriented, appears well and in no acute distress HEENT:     Atraumatic, conjunctiva clear, no obvious abnormalities on inspection of external nose and ears.  Neck:    Normal movements of the head and neck Pulmonary:     On inspection no  signs of respiratory distress, breathing rate appears normal, no obvious gross SOB, gasping or wheezing Cardiovascular:    No obvious cyanosis Musculoskeletal:    Moves all visible extremities without noticeable abnormality Psych / Neurological:     Pleasant and cooperative, no obvious depression or anxiety, speech and thought processing grossly intact  Assessment and Plan:    ICD-10-CM   1. Diarrhea, unspecified type  R19.7   2. Acute non intractable tension-type headache  G44.209    Will send for Covid-19 testing Quarantine until test is back  Note for work  I discussed the assessment and treatment plan with the patient. The patient was provided an opportunity to ask questions and all were answered. The patient agreed with the plan and demonstrated an understanding of the instructions.   The patient was advised to call back or seek an in-person evaluation if the symptoms worsen or if the condition fails to improve as anticipated.  Follow-up: prn unless noted otherwise below No follow-ups on file.  No orders of the defined types were placed in this encounter.  No orders of the defined types were placed in this encounter.   Signed,  Maud Deed. Garion Wempe, MD

## 2019-03-25 LAB — NOVEL CORONAVIRUS, NAA: SARS-CoV-2, NAA: NOT DETECTED

## 2019-03-30 ENCOUNTER — Other Ambulatory Visit: Payer: Self-pay

## 2019-03-30 DIAGNOSIS — Z20822 Contact with and (suspected) exposure to covid-19: Secondary | ICD-10-CM

## 2019-03-31 LAB — NOVEL CORONAVIRUS, NAA: SARS-CoV-2, NAA: NOT DETECTED

## 2019-07-20 ENCOUNTER — Other Ambulatory Visit: Payer: Self-pay | Admitting: Primary Care

## 2019-07-20 DIAGNOSIS — E538 Deficiency of other specified B group vitamins: Secondary | ICD-10-CM

## 2019-07-20 DIAGNOSIS — I10 Essential (primary) hypertension: Secondary | ICD-10-CM

## 2019-07-20 DIAGNOSIS — E559 Vitamin D deficiency, unspecified: Secondary | ICD-10-CM

## 2019-07-23 ENCOUNTER — Other Ambulatory Visit (INDEPENDENT_AMBULATORY_CARE_PROVIDER_SITE_OTHER): Payer: Commercial Managed Care - PPO

## 2019-07-23 DIAGNOSIS — I10 Essential (primary) hypertension: Secondary | ICD-10-CM | POA: Diagnosis not present

## 2019-07-23 DIAGNOSIS — E559 Vitamin D deficiency, unspecified: Secondary | ICD-10-CM

## 2019-07-23 DIAGNOSIS — E538 Deficiency of other specified B group vitamins: Secondary | ICD-10-CM | POA: Diagnosis not present

## 2019-07-23 LAB — LIPID PANEL
Cholesterol: 161 mg/dL (ref 0–200)
HDL: 73.9 mg/dL (ref >39.00)
LDL Cholesterol: 63 mg/dL (ref 0–99)
NonHDL: 86.92
Total CHOL/HDL Ratio: 2
Triglycerides: 119 mg/dL (ref 0.0–149.0)
VLDL: 23.8 mg/dL (ref 0.0–40.0)

## 2019-07-23 LAB — CBC
HCT: 39.5 % (ref 36.0–46.0)
Hemoglobin: 13.9 g/dL (ref 12.0–15.0)
MCHC: 35.2 g/dL (ref 30.0–36.0)
MCV: 111.8 fl — ABNORMAL HIGH (ref 78.0–100.0)
Platelets: 117 10*3/uL — ABNORMAL LOW (ref 150.0–400.0)
RBC: 3.53 Mil/uL — ABNORMAL LOW (ref 3.87–5.11)
RDW: 13.5 % (ref 11.5–15.5)
WBC: 6 10*3/uL (ref 4.0–10.5)

## 2019-07-23 LAB — COMPREHENSIVE METABOLIC PANEL
ALT: 12 U/L (ref 0–35)
AST: 36 U/L (ref 0–37)
Albumin: 4.1 g/dL (ref 3.5–5.2)
Alkaline Phosphatase: 96 U/L (ref 39–117)
BUN: 12 mg/dL (ref 6–23)
CO2: 29 mEq/L (ref 19–32)
Calcium: 9.2 mg/dL (ref 8.4–10.5)
Chloride: 105 mEq/L (ref 96–112)
Creatinine, Ser: 0.67 mg/dL (ref 0.40–1.20)
GFR: 93.6 mL/min (ref >60.00)
Glucose, Bld: 103 mg/dL — ABNORMAL HIGH (ref 70–99)
Potassium: 3.7 mEq/L (ref 3.5–5.1)
Sodium: 140 mEq/L (ref 135–145)
Total Bilirubin: 0.8 mg/dL (ref 0.2–1.2)
Total Protein: 6.4 g/dL (ref 6.0–8.3)

## 2019-07-23 LAB — VITAMIN B12: Vitamin B-12: 338 pg/mL (ref 211–911)

## 2019-07-23 LAB — FOLATE: Folate: 2.8 ng/mL — ABNORMAL LOW (ref >5.9)

## 2019-07-23 LAB — VITAMIN D 25 HYDROXY (VIT D DEFICIENCY, FRACTURES): VITD: 18.58 ng/mL — ABNORMAL LOW (ref 30.00–100.00)

## 2019-07-30 ENCOUNTER — Telehealth: Payer: Self-pay

## 2019-07-30 ENCOUNTER — Other Ambulatory Visit: Payer: Self-pay

## 2019-07-30 ENCOUNTER — Ambulatory Visit: Payer: Commercial Managed Care - PPO | Admitting: Primary Care

## 2019-07-30 ENCOUNTER — Encounter: Payer: Self-pay | Admitting: Primary Care

## 2019-07-30 VITALS — BP 136/82 | HR 82 | Temp 97.2°F | Ht 62.5 in | Wt 125.8 lb

## 2019-07-30 DIAGNOSIS — E538 Deficiency of other specified B group vitamins: Secondary | ICD-10-CM

## 2019-07-30 DIAGNOSIS — Z8371 Family history of colonic polyps: Secondary | ICD-10-CM

## 2019-07-30 DIAGNOSIS — Z0001 Encounter for general adult medical examination with abnormal findings: Secondary | ICD-10-CM | POA: Insufficient documentation

## 2019-07-30 DIAGNOSIS — Z1231 Encounter for screening mammogram for malignant neoplasm of breast: Secondary | ICD-10-CM | POA: Diagnosis not present

## 2019-07-30 DIAGNOSIS — Z0000 Encounter for general adult medical examination without abnormal findings: Secondary | ICD-10-CM

## 2019-07-30 DIAGNOSIS — R202 Paresthesia of skin: Secondary | ICD-10-CM | POA: Diagnosis not present

## 2019-07-30 DIAGNOSIS — R739 Hyperglycemia, unspecified: Secondary | ICD-10-CM

## 2019-07-30 DIAGNOSIS — Z1211 Encounter for screening for malignant neoplasm of colon: Secondary | ICD-10-CM

## 2019-07-30 DIAGNOSIS — E559 Vitamin D deficiency, unspecified: Secondary | ICD-10-CM

## 2019-07-30 DIAGNOSIS — I10 Essential (primary) hypertension: Secondary | ICD-10-CM

## 2019-07-30 DIAGNOSIS — D529 Folate deficiency anemia, unspecified: Secondary | ICD-10-CM

## 2019-07-30 HISTORY — DX: Deficiency of other specified B group vitamins: E53.8

## 2019-07-30 NOTE — Assessment & Plan Note (Signed)
Tetanus UTD, will get flu shot at work. Mammogram overdue, ordered and pending. Referral placed to GI for colonoscopy. Encouraged a healthy diet, regular exercise. Exam today unremarkable. Labs reviewed.

## 2019-07-30 NOTE — Assessment & Plan Note (Signed)
Recent level of 18.  Will start vitamin D 5000 units daily and add calcium. Repeat in 2 months.

## 2019-07-30 NOTE — Assessment & Plan Note (Signed)
Continued and intermittent, saw podiatry who suspects arthritis. She completed a cortisone injection with improvement.

## 2019-07-30 NOTE — Assessment & Plan Note (Signed)
Borderline in the office today, improved compared to prior checks. She will start monitoring at home and report readings that are consistently at or above 135/90.

## 2019-07-30 NOTE — Progress Notes (Signed)
Subjective:    Patient ID: Maria [name], female    DOB: 05-21-1971, 49 y.o.   MRN: [id number]  HPI  This visit occurred during the SARS-CoV-2 public health emergency.  Safety protocols were in place, including screening questions prior to the visit, additional usage of staff PPE, and extensive cleaning of exam room while observing appropriate contact time as indicated for disinfecting solutions.   Maria [name] is a 49 year old female who presents today for complete physical.  Immunizations: -Tetanus: Unsure, thinks it's up to date. -Influenza: Due  Diet: She endorses a fair diet. She is working to stay hydrated with water, less soda overall.  Exercise: She is very active at work  Eye exam: No recent exam Dental exam: Completed semi-annually  Pap Smear: Completed in 2019, hysterectomy  Mammogram: No recent exam Colonoscopy: Never completed   BP Readings from Last 3 Encounters:  07/30/19 136/82  03/23/19 (!) 162/94  12/08/18 (!) 150/62     Review of Systems  Constitutional: Negative for unexpected weight change.  HENT: Negative for rhinorrhea.   Respiratory: Negative for cough and shortness of breath.   Cardiovascular: Negative for chest pain.  Gastrointestinal: Negative for constipation and diarrhea.  Genitourinary: Negative for difficulty urinating.  Musculoskeletal: Positive for myalgias. Negative for arthralgias.       Recent muscle cramping   Skin: Negative for rash.  Allergic/Immunologic: Negative for environmental allergies.  Neurological: Negative for dizziness, numbness and headaches.  Psychiatric/Behavioral: The patient is not nervous/anxious.        Past Medical History:  Diagnosis Date  . Anemia   . Anxiety    h/o  . Depression    h/o  . Family history of adverse reaction to anesthesia    mom-hard time waking up  . GERD (gastroesophageal reflux disease)    tums prn  . Hypertension    WAS PUT ON BP MED BY PCP LAST YEAR (2018) AND BP MED MADE PT  SICK SO SHE STOPPED TAKING IT-PCP MONITORS BP NOW  . Pancreatitis      Social History   Socioeconomic History  . Marital status: Married    Spouse name: Not on file  . Number of children: Not on file  . Years of education: Not on file  . Highest education level: Not on file  Occupational History  . Not on file  Tobacco Use  . Smoking status: Current Every Day Smoker    Packs/day: 1.00    Years: 20.00    Pack years: 20.00    Types: Cigarettes  . Smokeless tobacco: Never Used  Substance and Sexual Activity  . Alcohol use: Yes    Alcohol/week: 2.0 standard drinks    Types: 2 Glasses of wine per week    Comment: two glasses wine/beer every other day  . Drug use: No  . Sexual activity: Yes  Other Topics Concern  . Not on file  Social History Narrative   Married.   1 child.    Works at [employer].   Enjoys swimming, camping   Social Determinants of Health   Financial Resource Strain:   . Difficulty of Paying Living Expenses: Not on file  Food Insecurity:   . Worried About Charity fundraiser in the Last Year: Not on file  . Ran Out of Food in the Last Year: Not on file  Transportation Needs:   . Lack of Transportation (Medical): Not on file  . Lack of Transportation (Non-Medical): Not on file  Physical  Activity:   . Days of Exercise per Week: Not on file  . Minutes of Exercise per Session: Not on file  Stress:   . Feeling of Stress : Not on file  Social Connections:   . Frequency of Communication with Friends and Family: Not on file  . Frequency of Social Gatherings with Friends and Family: Not on file  . Attends Religious Services: Not on file  . Active Member of Clubs or Organizations: Not on file  . Attends Archivist Meetings: Not on file  . Marital Status: Not on file  Intimate Partner Violence:   . Fear of Current or Ex-Partner: Not on file  . Emotionally Abused: Not on file  . Physically Abused: Not on file  . Sexually Abused: Not on file     Past Surgical History:  Procedure Laterality Date  . ANTERIOR AND POSTERIOR REPAIR WITH SACROSPINOUS FIXATION N/A 03/13/2018   Procedure: ANTERIOR REPAIR;  Surgeon: [name], MD;  Location: [facility]S;  Service: Gynecology;  Laterality: N/A;  . INDUCED ABORTION     x2  . VAGINAL HYSTERECTOMY N/A 03/13/2018   Procedure: HYSTERECTOMY VAGINAL;  Surgeon: [name], MD;  Location: [facility];  Service: Gynecology;  Laterality: N/A;    Family History  Problem Relation Age of Onset  . Arthritis Mother   . Arthritis Father   . Alcohol abuse Father   . Hypertension Father   . Heart disease Father   . Aneurysm Father   . Breast cancer Maternal Grandmother   . Breast cancer Paternal Grandmother     Allergies  Allergen Reactions  . Bee Venom Swelling  . Codeine Swelling  . Bupropion Anxiety and Other (See Comments)    Worsened anxiety    Current Outpatient Medications on File Prior to Visit  Medication Sig Dispense Refill  . EPINEPHrine 0.3 mg/0.3 mL IJ SOAJ injection Inject 0.3 mLs (0.3 mg total) into the muscle as needed for anaphylaxis. 1 Device 0  . hydrOXYzine (ATARAX/VISTARIL) 10 MG tablet Take 1-2 tablets (10-20 mg total) by mouth 3 (three) times daily as needed for itching. 20 tablet 0  . triamcinolone cream (KENALOG) 0.1 % Apply 1 application topically 2 (two) times daily. (Patient not taking: Reported on 07/30/2019) 30 g 0   No current facility-administered medications on file prior to visit.    BP 136/82   Pulse 82   Temp (!) 97.2 F (36.2 C) (Temporal)   Ht 5' 2.5" (1.588 m)   Wt 125 lb 12 oz (57 kg)   LMP 02/24/2018 (Approximate) Comment: spotting   SpO2 98%   BMI 22.63 kg/m    Objective:   Physical Exam  Constitutional: She is oriented to person, place, and time. She appears well-nourished.  HENT:  Right Ear: Tympanic membrane and ear canal normal.  Left Ear: Tympanic membrane and ear canal normal.  Mouth/Throat: Oropharynx is clear and moist.   Eyes: Pupils are equal, round, and reactive to light. EOM are normal.  Cardiovascular: Normal rate and regular rhythm.  Respiratory: Effort normal and breath sounds normal.  GI: Soft. Bowel sounds are normal. There is no abdominal tenderness.  Musculoskeletal:        General: Normal range of motion.     Cervical back: Neck supple.  Neurological: She is alert and oriented to person, place, and time. No cranial nerve deficit.  Reflex Scores:      Patellar reflexes are 2+ on the right side and 2+ on the left  side. Skin: Skin is warm and dry.  Psychiatric: She has a normal mood and affect.           Assessment & Plan:

## 2019-07-30 NOTE — Telephone Encounter (Signed)
Gastroenterology Pre-Procedure Review  Request Date: Tuesday 08/18/19 Requesting Physician: Dr. Vicente Males  PATIENT REVIEW QUESTIONS: The patient responded to the following health history questions as indicated:    1. Are you having any GI issues? no 2. Do you have a personal history of Polyps? no 3. Do you have a family history of Colon Cancer or Polyps? yes (mother colon polyps) 4. Diabetes Mellitus? no 5. Joint replacements in the past 12 months?no 6. Major health problems in the past 3 months?no 7. Any artificial heart valves, MVP, or defibrillator?no    MEDICATIONS & ALLERGIES:    Patient reports the following regarding taking any anticoagulation/antiplatelet therapy:   Plavix, Coumadin, Eliquis, Xarelto, Lovenox, Pradaxa, Brilinta, or Effient? no Aspirin? no  Patient confirms/reports the following medications:  Current Outpatient Medications  Medication Sig Dispense Refill  . EPINEPHrine 0.3 mg/0.3 mL IJ SOAJ injection Inject 0.3 mLs (0.3 mg total) into the muscle as needed for anaphylaxis. 1 Device 0  . hydrOXYzine (ATARAX/VISTARIL) 10 MG tablet Take 1-2 tablets (10-20 mg total) by mouth 3 (three) times daily as needed for itching. 20 tablet 0  . triamcinolone cream (KENALOG) 0.1 % Apply 1 application topically 2 (two) times daily. (Patient not taking: Reported on 07/30/2019) 30 g 0   No current facility-administered medications for this visit.    Patient confirms/reports the following allergies:  Allergies  Allergen Reactions  . Bee Venom Swelling  . Codeine Swelling  . Bupropion Anxiety and Other (See Comments)    Worsened anxiety    No orders of the defined types were placed in this encounter.   AUTHORIZATION INFORMATION Primary Insurance: 1D#: Group #:  Secondary Insurance: 1D#: Group #:  SCHEDULE INFORMATION: Date: Tuesday 02/16/21Time: Location:[facility]
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
## 2019-07-30 NOTE — Patient Instructions (Addendum)
Start taking calcium. You need 1200 mg of calcium daily.   I recommend you start 5000 units of vitamin D daily.   Start a Women's multivitamin with folic acid.  Start a separate folic acid 1 mg vitamin.   You will be contacted regarding your referral to GI for the colonoscopy.  Please let us know if you have not been contacted within two weeks.   Call the breast center to schedule your mammogram.  Schedule a lab only appointment for 2 months to repeat blood counts and vitamin levels.  It was a pleasure to see you today!   Preventive Care 82-47 Years Old, Female Preventive care refers to visits with your health care provider and lifestyle choices that can promote health and wellness. This includes:  A yearly physical exam. This may also be called an annual well check.  Regular dental visits and eye exams.  Immunizations.  Screening for certain conditions.  Healthy lifestyle choices, such as eating a healthy diet, getting regular exercise, not using drugs or products that contain nicotine and tobacco, and limiting alcohol use. What can I expect for my preventive care visit? Physical exam Your health care provider will check your:  Height and weight. This may be used to calculate body mass index (BMI), which tells if you are at a healthy weight.  Heart rate and blood pressure.  Skin for abnormal spots. Counseling Your health care provider may ask you questions about your:  Alcohol, tobacco, and drug use.  Emotional well-being.  Home and relationship well-being.  Sexual activity.  Eating habits.  Work and work Statistician.  Method of birth control.  Menstrual cycle.  Pregnancy history. What immunizations do I need?  Influenza (flu) vaccine  This is recommended every year. Tetanus, diphtheria, and pertussis (Tdap) vaccine  You may need a Td booster every 10 years. Varicella (chickenpox) vaccine  You may need this if you have not been vaccinated. Zoster  (shingles) vaccine  You may need this after age 54. Measles, mumps, and rubella (MMR) vaccine  You may need at least one dose of MMR if you were born in 1957 or later. You may also need a second dose. Pneumococcal conjugate (PCV13) vaccine  You may need this if you have certain conditions and were not previously vaccinated. Pneumococcal polysaccharide (PPSV23) vaccine  You may need one or two doses if you smoke cigarettes or if you have certain conditions. Meningococcal conjugate (MenACWY) vaccine  You may need this if you have certain conditions. Hepatitis A vaccine  You may need this if you have certain conditions or if you travel or work in places where you may be exposed to hepatitis A. Hepatitis B vaccine  You may need this if you have certain conditions or if you travel or work in places where you may be exposed to hepatitis B. Haemophilus influenzae type b (Hib) vaccine  You may need this if you have certain conditions. Human papillomavirus (HPV) vaccine  If recommended by your health care provider, you may need three doses over 6 months. You may receive vaccines as individual doses or as more than one vaccine together in one shot (combination vaccines). Talk with your health care provider about the risks and benefits of combination vaccines. What tests do I need? Blood tests  Lipid and cholesterol levels. These may be checked every 5 years, or more frequently if you are over 39 years old.  Hepatitis C test.  Hepatitis B test. Screening  Lung cancer screening. You may  have this screening every year starting at age 19 if you have a 30-pack-year history of smoking and currently smoke or have quit within the past 15 years.  Colorectal cancer screening. All adults should have this screening starting at age 77 and continuing until age 49. Your health care provider may recommend screening at age 49 if you are at increased risk. You will have tests every 1-10 years, depending  on your results and the type of screening test.  Diabetes screening. This is done by checking your blood sugar (glucose) after you have not eaten for a while (fasting). You may have this done every 1-3 years.  Mammogram. This may be done every 1-2 years. Talk with your health care provider about when you should start having regular mammograms. This may depend on whether you have a family history of breast cancer.  BRCA-related cancer screening. This may be done if you have a family history of breast, ovarian, tubal, or peritoneal cancers.  Pelvic exam and Pap test. This may be done every 3 years starting at age 87. Starting at age 77, this may be done every 5 years if you have a Pap test in combination with an HPV test. Other tests  Sexually transmitted disease (STD) testing.  Bone density scan. This is done to screen for osteoporosis. You may have this scan if you are at high risk for osteoporosis. Follow these instructions at home: Eating and drinking  Eat a diet that includes fresh fruits and vegetables, whole grains, lean protein, and low-fat dairy.  Take vitamin and mineral supplements as recommended by your health care provider.  Do not drink alcohol if: ? Your health care provider tells you not to drink. ? You are pregnant, may be pregnant, or are planning to become pregnant.  If you drink alcohol: ? Limit how much you have to 0-1 drink a day. ? Be aware of how much alcohol is in your drink. In the [place] one drink equals one 12 oz bottle of beer (355 mL), one 5 oz glass of wine (148 mL), or one 1 oz glass of hard liquor (44 mL). Lifestyle  Take daily care of your teeth and gums.  Stay active. Exercise for at least 30 minutes on 5 or more days each week.  Do not use any products that contain nicotine or tobacco, such as cigarettes, e-cigarettes, and chewing tobacco. If you need help quitting, ask your health care provider.  If you are sexually active, practice safe sex. Use  a condom or other form of birth control (contraception) in order to prevent pregnancy and STIs (sexually transmitted infections).  If told by your health care provider, take low-dose aspirin daily starting at age 38. What's next?  Visit your health care provider once a year for a well check visit.  Ask your health care provider how often you should have your eyes and teeth checked.  Stay up to date on all vaccines. This information is not intended to replace advice given to you by your health care provider. Make sure you discuss any questions you have with your health care provider. Document Revised: 02/27/2018 Document Reviewed: 02/27/2018 Elsevier Patient Education  2020 [employer].

## 2019-07-30 NOTE — Assessment & Plan Note (Signed)
Recent B12 level within normal range but she does have elevated MCV with folate deficiency. Will treat folate deficiency.

## 2019-07-30 NOTE — Assessment & Plan Note (Signed)
Slight hyperglycemia noted on recent labs. Will add A1C to upcoming labs.

## 2019-08-14 ENCOUNTER — Other Ambulatory Visit: Admission: RE | Admit: 2019-08-14 | Payer: Commercial Managed Care - PPO | Source: Ambulatory Visit

## 2019-08-17 ENCOUNTER — Telehealth: Payer: Self-pay

## 2019-08-17 NOTE — Telephone Encounter (Signed)
Spoke with pt to follow up regarding her decision to reschedule her colonoscopy. Pt has decided to reschedule to 09-22-19. [facility] has been notified.

## 2019-08-17 NOTE — Telephone Encounter (Signed)
Spoke with pt and informed her that she missed her pre-procedure COVID test on 08-14-19. Pt states she had a COVID test at work on 08-14-19 and has her results. I explained that we can use those results if she's able to fax them today. Pt states she may or may not need to reschedule her procedure due to having short staff at her job, she plans to call back before lunch time today.

## 2019-09-14 ENCOUNTER — Ambulatory Visit: Payer: Commercial Managed Care - PPO | Admitting: Primary Care

## 2019-09-14 DIAGNOSIS — Z0289 Encounter for other administrative examinations: Secondary | ICD-10-CM

## 2019-09-17 ENCOUNTER — Other Ambulatory Visit: Payer: Self-pay | Admitting: Primary Care

## 2019-09-17 DIAGNOSIS — E559 Vitamin D deficiency, unspecified: Secondary | ICD-10-CM

## 2019-09-17 DIAGNOSIS — E538 Deficiency of other specified B group vitamins: Secondary | ICD-10-CM

## 2019-09-17 DIAGNOSIS — R739 Hyperglycemia, unspecified: Secondary | ICD-10-CM

## 2019-09-18 ENCOUNTER — Other Ambulatory Visit: Admission: RE | Admit: 2019-09-18 | Payer: Commercial Managed Care - PPO | Source: Ambulatory Visit

## 2019-09-21 ENCOUNTER — Ambulatory Visit: Payer: Commercial Managed Care - PPO | Admitting: Primary Care

## 2019-09-21 ENCOUNTER — Telehealth: Payer: Self-pay

## 2019-09-21 NOTE — Telephone Encounter (Signed)
Endoscopy has notified me that, patient has not had her COVID Test.  They attempted to contact her to tell her to have it done-but was unable to contact her.  They've put her in the depot.  Colonoscopy 09/22/19  Thanks,  Sharyn Lull, CMA

## 2019-09-22 ENCOUNTER — Encounter: Admission: RE | Payer: Self-pay | Source: Home / Self Care

## 2019-09-22 ENCOUNTER — Ambulatory Visit
Admission: RE | Admit: 2019-09-22 | Payer: Commercial Managed Care - PPO | Source: Home / Self Care | Admitting: Gastroenterology

## 2019-09-22 SURGERY — COLONOSCOPY WITH PROPOFOL
Anesthesia: General

## 2019-09-28 ENCOUNTER — Other Ambulatory Visit (INDEPENDENT_AMBULATORY_CARE_PROVIDER_SITE_OTHER): Payer: Commercial Managed Care - PPO

## 2019-09-28 ENCOUNTER — Other Ambulatory Visit: Payer: Self-pay

## 2019-09-28 DIAGNOSIS — R739 Hyperglycemia, unspecified: Secondary | ICD-10-CM

## 2019-09-28 DIAGNOSIS — E559 Vitamin D deficiency, unspecified: Secondary | ICD-10-CM

## 2019-09-28 DIAGNOSIS — E538 Deficiency of other specified B group vitamins: Secondary | ICD-10-CM | POA: Diagnosis not present

## 2019-09-28 LAB — FOLATE: Folate: 3.8 ng/mL — ABNORMAL LOW (ref >5.9)

## 2019-09-28 LAB — VITAMIN D 25 HYDROXY (VIT D DEFICIENCY, FRACTURES): VITD: 32.25 ng/mL (ref 30.00–100.00)

## 2019-09-28 LAB — HEMOGLOBIN A1C: Hgb A1c MFr Bld: 5 % (ref 4.6–6.5)

## 2019-11-11 ENCOUNTER — Telehealth: Payer: Self-pay | Admitting: Primary Care

## 2019-11-11 NOTE — Telephone Encounter (Signed)
Patient called requesting a refill  hydrOXYzine (ATARAX/VISTARIL) 10 MG tablet  She stated she no longer has refilled due having to use it until summer time   [facility]- Lorina Rabon

## 2019-11-12 NOTE — Telephone Encounter (Signed)
Last prescribed By Dr Einar Pheasant on 12/08/2018 . Last OV on 07/30/2019 (cpe). Next future OV on 11/13/2019

## 2019-11-13 ENCOUNTER — Ambulatory Visit (INDEPENDENT_AMBULATORY_CARE_PROVIDER_SITE_OTHER): Payer: Commercial Managed Care - PPO | Admitting: Primary Care

## 2019-11-13 ENCOUNTER — Encounter: Payer: Self-pay | Admitting: Primary Care

## 2019-11-13 ENCOUNTER — Other Ambulatory Visit: Payer: Self-pay

## 2019-11-13 VITALS — BP 130/82 | HR 66 | Temp 96.0°F | Ht 62.5 in | Wt 127.0 lb

## 2019-11-13 DIAGNOSIS — F339 Major depressive disorder, recurrent, unspecified: Secondary | ICD-10-CM

## 2019-11-13 DIAGNOSIS — L237 Allergic contact dermatitis due to plants, except food: Secondary | ICD-10-CM | POA: Diagnosis not present

## 2019-11-13 DIAGNOSIS — Z91030 Bee allergy status: Secondary | ICD-10-CM | POA: Diagnosis not present

## 2019-11-13 HISTORY — DX: Allergic contact dermatitis due to plants, except food: L23.7

## 2019-11-13 MED ORDER — PREDNISONE 20 MG PO TABS: ORAL_TABLET | ORAL | 0 refills | Status: DC

## 2019-11-13 MED ORDER — HYDROXYZINE HCL 10 MG PO TABS: 10.0000 mg | ORAL_TABLET | Freq: Three times a day (TID) | ORAL | 0 refills | Status: DC | PRN

## 2019-11-13 MED ORDER — TRIAMCINOLONE ACETONIDE 0.1 % EX CREA: 1.0000 "application " | TOPICAL_CREAM | Freq: Two times a day (BID) | CUTANEOUS | 0 refills | Status: DC

## 2019-11-13 NOTE — Progress Notes (Signed)
Subjective:    Patient ID: Maria [name], female    DOB: [birth year], 49 y.o.   MRN: [id number]  HPI  This visit occurred during the SARS-CoV-2 public health emergency.  Safety protocols were in place, including screening questions prior to the visit, additional usage of staff PPE, and extensive cleaning of exam room while observing appropriate contact time as indicated for disinfecting solutions.   Maria [name] is a 49 year old female with a history of hypertension, peripheral neuropathy, paresthesia, vitamin B 12 deficiency, folate deficiency, tobacc aobuse hyperglycemia who presents today with a chief complaint of rash, she would also like to discuss depression. She is also needing medication refills.   1) Rash: Located to bilateral upper extremities that she first noticed one week ago after working in her in-laws house and yard. The rash is itchy so she's applied OTC hydrocortisone cream with some improvement in itching. She is waking during the night scratching. Two weeks ago she was staying in a [employer] in [place], [place]. No one else in her household is itching.  No new lotions, detergents, soaps or shampoos. No new medicines, vitamins, supplements. No new pets.  No fevers/chills, oral lesions, new joint pains, tick bites, abdominal pain, nausea.    2) Grief/Depression: Intermittent history of depression for years, has never really dealt with symptoms. She recently loss her brother to a motorcycle accident last week, attended his funeral in [place], having a hard time coping. Other chronic symptoms include feeling emotional, feeling overwhelmed, decreased motivation. PHQ 9 score of 5. Denies SI/HI, anxiety. She is interested in speaking with therapy.   BP Readings from Last 3 Encounters:  11/13/19 130/82  07/30/19 136/82  03/23/19 (!) 162/94     Review of Systems  Respiratory: Negative for shortness of breath and wheezing.   Skin: Positive for rash.  Psychiatric/Behavioral:   See HPI       Past Medical History:  Diagnosis Date  . Anemia   . Anxiety    h/o  . Depression    h/o  . Family history of adverse reaction to anesthesia    mom-hard time waking up  . GERD (gastroesophageal reflux disease)    tums prn  . Hypertension    WAS PUT ON BP MED BY PCP LAST YEAR (2018) AND BP MED MADE PT SICK SO SHE STOPPED TAKING IT-PCP MONITORS BP NOW  . Pancreatitis      Social History   Socioeconomic History  . Marital status: Married    Spouse name: Not on file  . Number of children: Not on file  . Years of education: Not on file  . Highest education level: Not on file  Occupational History  . Not on file  Tobacco Use  . Smoking status: Current Every Day Smoker    Packs/day: 1.00    Years: 20.00    Pack years: 20.00    Types: Cigarettes  . Smokeless tobacco: Never Used  Substance and Sexual Activity  . Alcohol use: Yes    Alcohol/week: 2.0 standard drinks    Types: 2 Glasses of wine per week    Comment: two glasses wine/beer every other day  . Drug use: No  . Sexual activity: Yes  Other Topics Concern  . Not on file  Social History Narrative   Married.   1 child.    Works at [employer].   Enjoys swimming, camping   Social Determinants of Health   Financial Resource Strain:   . Difficulty of  Paying Living Expenses:   Food Insecurity:   . Worried About Charity fundraiser in the Last Year:   . Arboriculturist in the Last Year:   Transportation Needs:   . Film/video editor (Medical):   Marland Kitchen Lack of Transportation (Non-Medical):   Physical Activity:   . Days of Exercise per Week:   . Minutes of Exercise per Session:   Stress:   . Feeling of Stress :   Social Connections:   . Frequency of Communication with Friends and Family:   . Frequency of Social Gatherings with Friends and Family:   . Attends Religious Services:   . Active Member of Clubs or Organizations:   . Attends Archivist Meetings:   Marland Kitchen Marital Status:     Intimate Partner Violence:   . Fear of Current or Ex-Partner:   . Emotionally Abused:   Marland Kitchen Physically Abused:   . Sexually Abused:     Past Surgical History:  Procedure Laterality Date  . ANTERIOR AND POSTERIOR REPAIR WITH SACROSPINOUS FIXATION N/A 03/13/2018   Procedure: ANTERIOR REPAIR;  Surgeon: [name], MD;  Location: [facility];  Service: Gynecology;  Laterality: N/A;  . INDUCED ABORTION     x2  . VAGINAL HYSTERECTOMY N/A 03/13/2018   Procedure: HYSTERECTOMY VAGINAL;  Surgeon: [name], MD;  Location: [facility];  Service: Gynecology;  Laterality: N/A;    Family History  Problem Relation Age of Onset  . Arthritis Mother   . Arthritis Father   . Alcohol abuse Father   . Hypertension Father   . Heart disease Father   . Aneurysm Father   . Breast cancer Maternal Grandmother   . Breast cancer Paternal Grandmother     Allergies  Allergen Reactions  . Bee Venom Swelling  . Codeine Swelling  . Bupropion Anxiety and Other (See Comments)    Worsened anxiety    Current Outpatient Medications on File Prior to Visit  Medication Sig Dispense Refill  . EPINEPHrine 0.3 mg/0.3 mL IJ SOAJ injection Inject 0.3 mLs (0.3 mg total) into the muscle as needed for anaphylaxis. 1 Device 0   No current facility-administered medications on file prior to visit.    BP 130/82   Pulse 66   Temp (!) 96 F (35.6 C) (Temporal)   Ht 5' 2.5" (1.588 m)   Wt 127 lb (57.6 kg)   LMP 02/24/2018 (Approximate) Comment: spotting   SpO2 98%   BMI 22.86 kg/m    Objective:   Physical Exam  Constitutional: She appears well-nourished.  Cardiovascular: Normal rate and regular rhythm.  Respiratory: Effort normal and breath sounds normal.  Musculoskeletal:     Cervical back: Neck supple.  Skin: Skin is warm and dry. Rash noted.  Several raised, red, vesicles to bilateral forearms.  Psychiatric: She has a normal mood and affect.           Assessment & Plan:

## 2019-11-13 NOTE — Patient Instructions (Signed)
Start prednisone 20 mg tablets for rash. Take 2 tablets once daily for three days, then 1 tablet once daily for three days.  You may use the triamcinolone cream twice daily as needed.   You will be contacted regarding your referral to therapy.  Please let us know if you have not been contacted within two weeks.   It was a pleasure to see you today!

## 2019-11-13 NOTE — Assessment & Plan Note (Signed)
HPI and presentation today representative of poison ivy dermatitis. Little improvement with OTC treatment.  There are several larger areas of the rash so we will treat with systemic and topical steroids. Refill of hydroxyzine provided per patient request.  She will update.

## 2019-11-13 NOTE — Assessment & Plan Note (Signed)
Intermittent for years, overall mild, exacerbated by the unexpected loss of her brother. Referral placed to therapy.

## 2019-11-13 NOTE — Telephone Encounter (Signed)
Refill provided this morning during her visit.

## 2019-11-25 ENCOUNTER — Ambulatory Visit: Payer: Commercial Managed Care - PPO | Admitting: Psychology

## 2019-12-01 ENCOUNTER — Ambulatory Visit: Payer: Commercial Managed Care - PPO | Admitting: Psychology

## 2019-12-09 ENCOUNTER — Other Ambulatory Visit: Payer: Self-pay

## 2019-12-09 ENCOUNTER — Encounter: Payer: Self-pay | Admitting: Primary Care

## 2019-12-09 ENCOUNTER — Ambulatory Visit: Payer: Commercial Managed Care - PPO | Admitting: Primary Care

## 2019-12-09 VITALS — BP 124/84 | HR 84 | Temp 97.2°F | Ht 62.5 in | Wt 126.0 lb

## 2019-12-09 DIAGNOSIS — D529 Folate deficiency anemia, unspecified: Secondary | ICD-10-CM | POA: Diagnosis not present

## 2019-12-09 DIAGNOSIS — R5383 Other fatigue: Secondary | ICD-10-CM | POA: Diagnosis not present

## 2019-12-09 DIAGNOSIS — F339 Major depressive disorder, recurrent, unspecified: Secondary | ICD-10-CM | POA: Diagnosis not present

## 2019-12-09 DIAGNOSIS — E538 Deficiency of other specified B group vitamins: Secondary | ICD-10-CM | POA: Insufficient documentation

## 2019-12-09 LAB — VITAMIN B12: Vitamin B-12: 275 pg/mL (ref 211–911)

## 2019-12-09 LAB — CBC
HCT: 38.2 % (ref 36.0–46.0)
Hemoglobin: 13.6 g/dL (ref 12.0–15.0)
MCHC: 35.6 g/dL (ref 30.0–36.0)
MCV: 110.5 fl — ABNORMAL HIGH (ref 78.0–100.0)
Platelets: 112 10*3/uL — ABNORMAL LOW (ref 150.0–400.0)
RBC: 3.46 Mil/uL — ABNORMAL LOW (ref 3.87–5.11)
RDW: 13 % (ref 11.5–15.5)
WBC: 5.8 10*3/uL (ref 4.0–10.5)

## 2019-12-09 LAB — BASIC METABOLIC PANEL
BUN: 10 mg/dL (ref 6–23)
CO2: 24 mEq/L (ref 19–32)
Calcium: 8.6 mg/dL (ref 8.4–10.5)
Chloride: 106 mEq/L (ref 96–112)
Creatinine, Ser: 0.57 mg/dL (ref 0.40–1.20)
GFR: 112.62 mL/min (ref >60.00)
Glucose, Bld: 109 mg/dL — ABNORMAL HIGH (ref 70–99)
Potassium: 3.5 mEq/L (ref 3.5–5.1)
Sodium: 138 mEq/L (ref 135–145)

## 2019-12-09 LAB — FOLATE: Folate: 2.5 ng/mL — ABNORMAL LOW (ref >5.9)

## 2019-12-09 LAB — TSH: TSH: 1.98 u[IU]/mL (ref 0.35–4.50)

## 2019-12-09 MED ORDER — FLUOXETINE HCL 20 MG PO TABS: 20.0000 mg | ORAL_TABLET | Freq: Every day | ORAL | 1 refills | Status: DC

## 2019-12-09 NOTE — Assessment & Plan Note (Signed)
No longer on folic acid, repeat CBC and folate pending.

## 2019-12-09 NOTE — Assessment & Plan Note (Signed)
Acute, likely secondary to depression.  Checking labs today to rule out metabolic cause. Will initiate treatment for depression.  Follow up in 6 weeks.

## 2019-12-09 NOTE — Patient Instructions (Signed)
Start fluoxetine (Prozac) 20 mg once daily for depression. Take 1/2 tablet daily for 6 days, then increase to 1 full tablet thereafter.  Work on [employer] with bland/solid food. Increase water intake.  Please schedule a follow up visit for 6 weeks as discussed.   It was a pleasure to see you today!

## 2019-12-09 NOTE — Assessment & Plan Note (Signed)
Not currently on B12 supplementation, repeat B12 lab pending.

## 2019-12-09 NOTE — Assessment & Plan Note (Signed)
Chronic for 3-4 months since the passing of a few close family members, worse recently.  Suspect fatigue is secondary to her depression, will rule out other causes with labs. She does have a known folate deficiency.  Checking labs.  Discussed options for treatment, she would like to try medication. Rx for fluoxetine sent to pharmacy. Patient is to take 1/2 tablet daily for 6 days, then advance to 1 full tablet thereafter. We discussed possible side effects of headache, GI upset, drowsiness, and SI/HI. If thoughts of SI/HI develop, we discussed to present to the emergency immediately. Patient verbalized understanding.   Follow up in 6 weeks for re-evaluation.

## 2019-12-09 NOTE — Progress Notes (Signed)
Subjective:    Patient ID: Maria [name], female    DOB: 03-27-1971, 49 y.o.   MRN: [id number]  HPI  This visit occurred during the SARS-CoV-2 public health emergency.  Safety protocols were in place, including screening questions prior to the visit, additional usage of staff PPE, and extensive cleaning of exam room while observing appropriate contact time as indicated for disinfecting solutions.   Maria [name] is a 49 year old female with a history of hypertension, peripheral neuropathy, vitamin D deficiency, vitamin B12 deficiency who presents today with a chief complaint of fatigue.  Her fatigue began about three weeks ago, increased over the last one week. She is sleeping a lot, about 7 hours nightly. She had to call out of work yesterday as "I could not get going", so she slept from 8 am to 1 pm yesterday after a full nights sleep the prior evening. She missed work again today, is needing a work note.  She also reports diarrhea that occurs with and without eating, about four small, liquid, "greasy" appearing episodes daily. This began about two weeks ago. She denies abdominal pain, bloody stools. She is not eating much, has little appetite, has been under a lot of stress.   She is working to increase her water intake, has cut back on intake of [employer]. She denies unexplained weight loss, night sweats, hemoptysis.   She was noted to have a folate deficiency in January 2021, she is not taking Folic acid as recommended.   She's feeling a lot of depression due to the recent passing of her brother and aunt. Symptoms began about 3-4 months ago and include feeling down, wanting to sleep all the time, little energy. PHQ 9 score of 13. She denies anxiety and SI/HI. She did call to get set up with a therapist, didn't feel a connection via phone so she doesn't plan on continuing.   Review of Systems  Constitutional: Negative for unexpected weight change.  Gastrointestinal: Positive for diarrhea.  Negative for abdominal pain, blood in stool, nausea and vomiting.  Psychiatric/Behavioral: Negative for sleep disturbance.       See HPI       Past Medical History:  Diagnosis Date  . Anemia   . Anxiety    h/o  . Depression    h/o  . Family history of adverse reaction to anesthesia    mom-hard time waking up  . GERD (gastroesophageal reflux disease)    tums prn  . Hypertension    WAS PUT ON BP MED BY PCP LAST YEAR (2018) AND BP MED MADE PT SICK SO SHE STOPPED TAKING IT-PCP MONITORS BP NOW  . Pancreatitis      Social History   Socioeconomic History  . Marital status: Married    Spouse name: Not on file  . Number of children: Not on file  . Years of education: Not on file  . Highest education level: Not on file  Occupational History  . Not on file  Tobacco Use  . Smoking status: Current Every Day Smoker    Packs/day: 1.00    Years: 20.00    Pack years: 20.00    Types: Cigarettes  . Smokeless tobacco: Never Used  Substance and Sexual Activity  . Alcohol use: Yes    Alcohol/week: 2.0 standard drinks    Types: 2 Glasses of wine per week    Comment: two glasses wine/beer every other day  . Drug use: No  . Sexual activity: Yes  Other  Topics Concern  . Not on file  Social History Narrative   Married.   1 child.    Works at [employer].   Enjoys swimming, camping   Social Determinants of Health   Financial Resource Strain:   . Difficulty of Paying Living Expenses:   Food Insecurity:   . Worried About Charity fundraiser in the Last Year:   . Arboriculturist in the Last Year:   Transportation Needs:   . Film/video editor (Medical):   Marland Kitchen Lack of Transportation (Non-Medical):   Physical Activity:   . Days of Exercise per Week:   . Minutes of Exercise per Session:   Stress:   . Feeling of Stress :   Social Connections:   . Frequency of Communication with Friends and Family:   . Frequency of Social Gatherings with Friends and Family:   . Attends Religious  Services:   . Active Member of Clubs or Organizations:   . Attends Archivist Meetings:   Marland Kitchen Marital Status:   Intimate Partner Violence:   . Fear of Current or Ex-Partner:   . Emotionally Abused:   Marland Kitchen Physically Abused:   . Sexually Abused:     Past Surgical History:  Procedure Laterality Date  . ANTERIOR AND POSTERIOR REPAIR WITH SACROSPINOUS FIXATION N/A 03/13/2018   Procedure: ANTERIOR REPAIR;  Surgeon: [name], MD;  Location: [facility];  Service: Gynecology;  Laterality: N/A;  . INDUCED ABORTION     x2  . VAGINAL HYSTERECTOMY N/A 03/13/2018   Procedure: HYSTERECTOMY VAGINAL;  Surgeon: [name], MD;  Location: [facility];  Service: Gynecology;  Laterality: N/A;    Family History  Problem Relation Age of Onset  . Arthritis Mother   . Arthritis Father   . Alcohol abuse Father   . Hypertension Father   . Heart disease Father   . Aneurysm Father   . Breast cancer Maternal Grandmother   . Breast cancer Paternal Grandmother     Allergies  Allergen Reactions  . Bee Venom Swelling  . Codeine Swelling  . Bupropion Anxiety and Other (See Comments)    Worsened anxiety    Current Outpatient Medications on File Prior to Visit  Medication Sig Dispense Refill  . EPINEPHrine 0.3 mg/0.3 mL IJ SOAJ injection Inject 0.3 mLs (0.3 mg total) into the muscle as needed for anaphylaxis. 1 Device 0  . hydrOXYzine (ATARAX/VISTARIL) 10 MG tablet Take 1-2 tablets (10-20 mg total) by mouth 3 (three) times daily as needed for itching. 20 tablet 0   No current facility-administered medications on file prior to visit.    BP 124/84   Pulse 84   Temp (!) 97.2 F (36.2 C) (Temporal)   Ht 5' 2.5" (1.588 m)   Wt 126 lb (57.2 kg)   LMP 02/24/2018 (Approximate) Comment: spotting   SpO2 98%   BMI 22.68 kg/m    Objective:   Physical Exam  Constitutional: She appears well-nourished.  Cardiovascular: Normal rate and regular rhythm.  Respiratory: Effort normal and breath  sounds normal.  Musculoskeletal:     Cervical back: Neck supple.  Skin: Skin is warm and dry.  Psychiatric: She has a normal mood and affect.           Assessment & Plan:

## 2019-12-23 ENCOUNTER — Telehealth (INDEPENDENT_AMBULATORY_CARE_PROVIDER_SITE_OTHER): Payer: Commercial Managed Care - PPO | Admitting: Primary Care

## 2019-12-23 DIAGNOSIS — J069 Acute upper respiratory infection, unspecified: Secondary | ICD-10-CM | POA: Diagnosis not present

## 2019-12-23 MED ORDER — CETIRIZINE HCL 10 MG PO TABS: 10.0000 mg | ORAL_TABLET | Freq: Every day | ORAL | 0 refills | Status: DC

## 2019-12-23 MED ORDER — FLUTICASONE PROPIONATE 50 MCG/ACT NA SUSP: 1.0000 | Freq: Two times a day (BID) | NASAL | 0 refills | Status: DC

## 2019-12-23 NOTE — Assessment & Plan Note (Signed)
Symptoms representative of viral vs allergy etiology. She doesn't appear sickly, no cough during visit. Treat with Zyrtec HS and Flonase BID. Will send Rx's. She will update in a few days, need to be monitoring for COPD exacerbation.

## 2019-12-23 NOTE — Progress Notes (Signed)
Subjective:    Patient ID: Maria [name], female    DOB: 1970/10/12, 49 y.o.   MRN: [id number]  HPI  Virtual Visit via Video Note  I connected with Maria [name] on 12/23/19 at  8:40 AM EDT by a video enabled telemedicine application and verified that I am speaking with the correct person using two identifiers.  Location: Patient: Home Provider: Office   I discussed the limitations of evaluation and management by telemedicine and the availability of in person appointments. The patient expressed understanding and agreed to proceed.  History of Present Illness:  Maria [name] is a 49 year old female with a history of hypertension, peripheral neuropathy, anemia, allergic rhinitis, sinusitis who presents today with a chief complaint of cough.  She also reports nasal congestion, sinus pressure, fatigue, headaches, rhinorrhea, difficulty sleeping, non productive cough. Symptoms began 3-4 days ago.  She's been taking Mucinex with some improvement. She denies fevers, loss of taste/smell, shortness of breath.    Observations/Objective:  Alert and oriented. Appears well, not sickly. No distress. Speaking in complete sentences. No cough during visit.  Assessment and Plan:  Symptoms representative of viral vs allergy etiology. She doesn't appear sickly, no cough during visit. Treat with Zyrtec HS and Flonase BID. Will send Rx's. She will update in a few days, need to be monitoring for COPD exacerbation.  Follow Up Instructions:  Start Zyrtec at bedtime.  Nasal Congestion/Ear Pressure/Sinus Pressure: Try using Flonase (fluticasone) nasal spray. Instill 1 spray in each nostril twice daily.   Please update me in a few days as discussed.  It was a pleasure to see you today! [name], NP-C    I discussed the assessment and treatment plan with the patient. The patient was provided an opportunity to ask questions and all were answered. The patient agreed with the plan and  demonstrated an understanding of the instructions.   The patient was advised to call back or seek an in-person evaluation if the symptoms worsen or if the condition fails to improve as anticipated.    [name], NP    Review of Systems  Constitutional: Positive for fatigue. Negative for chills and fever.  HENT: Positive for congestion, rhinorrhea and sinus pressure.   Respiratory: Positive for cough. Negative for shortness of breath.   Allergic/Immunologic: Positive for environmental allergies.       Past Medical History:  Diagnosis Date  . Anemia   . Anxiety    h/o  . Depression    h/o  . Family history of adverse reaction to anesthesia    mom-hard time waking up  . GERD (gastroesophageal reflux disease)    tums prn  . Hypertension    WAS PUT ON BP MED BY PCP LAST YEAR (2018) AND BP MED MADE PT SICK SO SHE STOPPED TAKING IT-PCP MONITORS BP NOW  . Pancreatitis      Social History   Socioeconomic History  . Marital status: Married    Spouse name: Not on file  . Number of children: Not on file  . Years of education: Not on file  . Highest education level: Not on file  Occupational History  . Not on file  Tobacco Use  . Smoking status: Current Every Day Smoker    Packs/day: 1.00    Years: 20.00    Pack years: 20.00    Types: Cigarettes  . Smokeless tobacco: Never Used  Vaping Use  . Vaping Use: Never used  Substance and Sexual Activity  .  Alcohol use: Yes    Alcohol/week: 2.0 standard drinks    Types: 2 Glasses of wine per week    Comment: two glasses wine/beer every other day  . Drug use: No  . Sexual activity: Yes  Other Topics Concern  . Not on file  Social History Narrative   Married.   1 child.    Works at [employer].   Enjoys swimming, camping   Social Determinants of Health   Financial Resource Strain:   . Difficulty of Paying Living Expenses:   Food Insecurity:   . Worried About Charity fundraiser in the Last Year:   . Academic librarian in the Last Year:   Transportation Needs:   . Film/video editor (Medical):   Marland Kitchen Lack of Transportation (Non-Medical):   Physical Activity:   . Days of Exercise per Week:   . Minutes of Exercise per Session:   Stress:   . Feeling of Stress :   Social Connections:   . Frequency of Communication with Friends and Family:   . Frequency of Social Gatherings with Friends and Family:   . Attends Religious Services:   . Active Member of Clubs or Organizations:   . Attends Archivist Meetings:   Marland Kitchen Marital Status:   Intimate Partner Violence:   . Fear of Current or Ex-Partner:   . Emotionally Abused:   Marland Kitchen Physically Abused:   . Sexually Abused:     Past Surgical History:  Procedure Laterality Date  . ANTERIOR AND POSTERIOR REPAIR WITH SACROSPINOUS FIXATION N/A 03/13/2018   Procedure: ANTERIOR REPAIR;  Surgeon: [name], MD;  Location: [facility];  Service: Gynecology;  Laterality: N/A;  . INDUCED ABORTION     x2  . VAGINAL HYSTERECTOMY N/A 03/13/2018   Procedure: HYSTERECTOMY VAGINAL;  Surgeon: [name], MD;  Location: [facility];  Service: Gynecology;  Laterality: N/A;    Family History  Problem Relation Age of Onset  . Arthritis Mother   . Arthritis Father   . Alcohol abuse Father   . Hypertension Father   . Heart disease Father   . Aneurysm Father   . Breast cancer Maternal Grandmother   . Breast cancer Paternal Grandmother     Allergies  Allergen Reactions  . Bee Venom Swelling  . Codeine Swelling  . Bupropion Anxiety and Other (See Comments)    Worsened anxiety    Current Outpatient Medications on File Prior to Visit  Medication Sig Dispense Refill  . EPINEPHrine 0.3 mg/0.3 mL IJ SOAJ injection Inject 0.3 mLs (0.3 mg total) into the muscle as needed for anaphylaxis. 1 Device 0  . FLUoxetine (PROZAC) 20 MG tablet Take 1 tablet (20 mg total) by mouth daily. For depression. 30 tablet 1  . hydrOXYzine (ATARAX/VISTARIL) 10 MG tablet Take 1-2  tablets (10-20 mg total) by mouth 3 (three) times daily as needed for itching. 20 tablet 0   No current facility-administered medications on file prior to visit.    LMP 02/24/2018 (Approximate) Comment: spotting  ' Objective:   Physical Exam  Constitutional: She is oriented to person, place, and time.  Respiratory: Effort normal.  GI: Normal appearance.  Neurological: She is alert and oriented to person, place, and time.  Psychiatric: Mood normal.           Assessment & Plan:

## 2019-12-23 NOTE — Patient Instructions (Signed)
Start Zyrtec at bedtime.  Nasal Congestion/Ear Pressure/Sinus Pressure: Try using Flonase (fluticasone) nasal spray. Instill 1 spray in each nostril twice daily.   Please update me in a few days as discussed.  It was a pleasure to see you today! [name], NP-C

## 2020-01-24 ENCOUNTER — Other Ambulatory Visit: Payer: Self-pay | Admitting: Primary Care

## 2020-01-24 DIAGNOSIS — J069 Acute upper respiratory infection, unspecified: Secondary | ICD-10-CM

## 2020-01-26 NOTE — Telephone Encounter (Addendum)
Spoken to patient and she stated request this refill because it is helping

## 2020-02-09 ENCOUNTER — Other Ambulatory Visit: Payer: Self-pay | Admitting: Primary Care

## 2020-02-09 DIAGNOSIS — J069 Acute upper respiratory infection, unspecified: Secondary | ICD-10-CM

## 2020-02-19 ENCOUNTER — Telehealth (INDEPENDENT_AMBULATORY_CARE_PROVIDER_SITE_OTHER): Payer: Commercial Managed Care - PPO | Admitting: Internal Medicine

## 2020-02-19 ENCOUNTER — Other Ambulatory Visit: Payer: Self-pay

## 2020-02-19 ENCOUNTER — Encounter: Payer: Self-pay | Admitting: Internal Medicine

## 2020-02-19 VITALS — BP 138/78 | Temp 98.4°F | Wt 120.0 lb

## 2020-02-19 DIAGNOSIS — B9689 Other specified bacterial agents as the cause of diseases classified elsewhere: Secondary | ICD-10-CM | POA: Diagnosis not present

## 2020-02-19 DIAGNOSIS — J019 Acute sinusitis, unspecified: Secondary | ICD-10-CM

## 2020-02-19 MED ORDER — AMOXICILLIN-POT CLAVULANATE 875-125 MG PO TABS
1.0000 | ORAL_TABLET | Freq: Two times a day (BID) | ORAL | 0 refills | Status: DC
Start: 2020-02-19 — End: 2020-05-03

## 2020-02-19 NOTE — Progress Notes (Signed)
Virtual Visit via Video Note  I connected with Maria [name] on 02/19/20 at  3:00 PM EDT by a video enabled telemedicine application and verified that I am speaking with the correct person using two identifiers.  Location: Patient: Home Provider: Office  Persons participating in this video visit: Webb Silversmith, NP-C and Shantice Menger  I discussed the limitations of evaluation and management by telemedicine and the availability of in person appointments. The patient expressed understanding and agreed to proceed.  HPI  Pt reports fatigue, facial pressure, nasal congestion and cough. This started 2 weeks ago. She is blowing green mucous out of her nose. The cough is productive of clear mucous. She denies headaches, dizziness, visual changes, runny nose, ear pain, sore throat, loss of taste/smell, or SOB. She denies fever, chills or body aches. She has tried Mucinex with minimal relief of symptoms. She has tried a [employer] but has not taken any Zyrtec or Flonase. She has not had sick contacts or exposure to Covid that she is aware of. She has had her Covid vaccine.  Review of Systems     Past Medical History:  Diagnosis Date  . Anemia   . Anxiety    h/o  . Depression    h/o  . Family history of adverse reaction to anesthesia    mom-hard time waking up  . GERD (gastroesophageal reflux disease)    tums prn  . Hypertension    WAS PUT ON BP MED BY PCP LAST YEAR (2018) AND BP MED MADE PT SICK SO SHE STOPPED TAKING IT-PCP MONITORS BP NOW  . Pancreatitis     Family History  Problem Relation Age of Onset  . Arthritis Mother   . Arthritis Father   . Alcohol abuse Father   . Hypertension Father   . Heart disease Father   . Aneurysm Father   . Breast cancer Maternal Grandmother   . Breast cancer Paternal Grandmother     Social History   Socioeconomic History  . Marital status: Married    Spouse name: Not on file  . Number of children: Not on file  . Years of education: Not on  file  . Highest education level: Not on file  Occupational History  . Not on file  Tobacco Use  . Smoking status: Current Every Day Smoker    Packs/day: 1.00    Years: 20.00    Pack years: 20.00    Types: Cigarettes  . Smokeless tobacco: Never Used  Vaping Use  . Vaping Use: Never used  Substance and Sexual Activity  . Alcohol use: Yes    Alcohol/week: 2.0 standard drinks    Types: 2 Glasses of wine per week    Comment: two glasses wine/beer every other day  . Drug use: No  . Sexual activity: Yes  Other Topics Concern  . Not on file  Social History Narrative   Married.   1 child.    Works at [employer].   Enjoys swimming, camping   Social Determinants of Health   Financial Resource Strain:   . Difficulty of Paying Living Expenses: Not on file  Food Insecurity:   . Worried About Charity fundraiser in the Last Year: Not on file  . Ran Out of Food in the Last Year: Not on file  Transportation Needs:   . Lack of Transportation (Medical): Not on file  . Lack of Transportation (Non-Medical): Not on file  Physical Activity:   . Days of Exercise per  Week: Not on file  . Minutes of Exercise per Session: Not on file  Stress:   . Feeling of Stress : Not on file  Social Connections:   . Frequency of Communication with Friends and Family: Not on file  . Frequency of Social Gatherings with Friends and Family: Not on file  . Attends Religious Services: Not on file  . Active Member of Clubs or Organizations: Not on file  . Attends Archivist Meetings: Not on file  . Marital Status: Not on file  Intimate Partner Violence:   . Fear of Current or Ex-Partner: Not on file  . Emotionally Abused: Not on file  . Physically Abused: Not on file  . Sexually Abused: Not on file    Allergies  Allergen Reactions  . Bee Venom Swelling  . Codeine Swelling  . Bupropion Anxiety and Other (See Comments)    Worsened anxiety     Constitutional: Positivefatigue. Denies  headache, fever or abrupt weight changes.  HEENT:  Positive facial pressure, nasal congestion. Denies eye redness, ear pain, ringing in the ears, wax buildup, runny nose or sore throat. Respiratory: Positive cough. Denies difficulty breathing or shortness of breath.  Cardiovascular: Denies chest pain, chest tightness, palpitations or swelling in the hands or feet.   No other specific complaints in a complete review of systems (except as listed in HPI above).  Objective:   LMP 02/24/2018 (Approximate) Comment: spotting   General: Appears her stated age, in NAD. HEENT:; Nose: congestion noted; Throat/Mouth: hoarseness noted.  Pulmonary/Chest: No cough noted      Assessment & Plan:   Acute Bacterial Sinusitis  Can use a Neti Pot which can be purchased from your local drug store. Flonase 2 sprays each nostril for 3 days and then as needed. eRx for Augmentin BID for 10 days  RTC as needed or if symptoms persist. Webb Silversmith, NP   Follow Up Instructions:    I discussed the assessment and treatment plan with the patient. The patient was provided an opportunity to ask questions and all were answered. The patient agreed with the plan and demonstrated an understanding of the instructions.   The patient was advised to call back or seek an in-person evaluation if the symptoms worsen or if the condition fails to improve as anticipated.    Webb Silversmith, NP

## 2020-02-19 NOTE — Patient Instructions (Signed)

## 2020-02-22 ENCOUNTER — Other Ambulatory Visit: Payer: Self-pay

## 2020-02-22 ENCOUNTER — Encounter: Payer: Self-pay | Admitting: Family Medicine

## 2020-02-22 ENCOUNTER — Telehealth (INDEPENDENT_AMBULATORY_CARE_PROVIDER_SITE_OTHER): Payer: Commercial Managed Care - PPO | Admitting: Family Medicine

## 2020-02-22 VITALS — BP 164/91 | Temp 97.1°F | Ht 62.5 in

## 2020-02-22 DIAGNOSIS — R110 Nausea: Secondary | ICD-10-CM

## 2020-02-22 DIAGNOSIS — K529 Noninfective gastroenteritis and colitis, unspecified: Secondary | ICD-10-CM | POA: Diagnosis not present

## 2020-02-22 DIAGNOSIS — R197 Diarrhea, unspecified: Secondary | ICD-10-CM

## 2020-02-22 MED ORDER — ONDANSETRON 4 MG PO TBDP: 4.0000 mg | ORAL_TABLET | Freq: Three times a day (TID) | ORAL | 0 refills | Status: DC | PRN

## 2020-02-22 NOTE — Progress Notes (Signed)
I connected with Maria [name] on 02/22/20 at 12:00 PM EDT by video and verified that I am speaking with the correct person using two identifiers.   I discussed the limitations, risks, security and privacy concerns of performing an evaluation and management service by video and the availability of in person appointments. I also discussed with the patient that there may be a patient responsible charge related to this service. The patient expressed understanding and agreed to proceed.  Patient location: Home Provider Location: [place] Participants: Lesleigh Noe and Maria [name]   Subjective:     Maria [name] is a 49 y.o. female presenting for Fatigue (x 3 days), Nausea (x 2 days), Diarrhea (x 2 days ), and Headache (x 3 days )     HPI  #Fatigue - started 3 days ago - nasal congestion - fatigue - nausea - diarrhea - recent rapid test - negative - hx of covid vaccine - works at [employer] - no known exposure to covid - diarrhea - depends on what she eats 2-3 BM daily - Hydration: not great  - nausea impacts eating and drinking - headache - [place] - eating: not much, a few bits throughout the day - forces herself to get up and do things  - stayed out and can only return with negative test result   No recent travel No sick contact No   Review of Systems   Social History   Tobacco Use  Smoking Status Current Every Day Smoker  . Packs/day: 1.00  . Years: 20.00  . Pack years: 20.00  . Types: Cigarettes  Smokeless Tobacco Never Used        Objective:   BP Readings from Last 3 Encounters:  02/22/20 (!) 164/91  02/19/20 138/78  12/09/19 124/84   Wt Readings from Last 3 Encounters:  02/19/20 120 lb (54.4 kg)  12/09/19 126 lb (57.2 kg)  11/13/19 127 lb (57.6 kg)    BP (!) 164/91   Temp (!) 97.1 F (36.2 C) (Oral)   Ht 5' 2.5" (1.588 m)   LMP 02/24/2018 (Approximate) Comment: spotting   BMI 21.60 kg/m    Physical  Exam Constitutional:      Appearance: Normal appearance. She is not ill-appearing.  HENT:     Head: Normocephalic and atraumatic.     Right Ear: External ear normal.     Left Ear: External ear normal.  Eyes:     Conjunctiva/sclera: Conjunctivae normal.  Pulmonary:     Effort: Pulmonary effort is normal. No respiratory distress.  Neurological:     Mental Status: She is alert. Mental status is at baseline.  Psychiatric:        Mood and Affect: Mood normal.        Behavior: Behavior normal.        Thought Content: Thought content normal.        Judgment: Judgment normal.          Assessment & Plan:   Problem List Items Addressed This Visit    None    Visit Diagnoses    Gastroenteritis    -  Primary   Nausea       Relevant Medications   ondansetron (ZOFRAN ODT) 4 MG disintegrating tablet   Diarrhea, unspecified type         Suspect gastroenteritis Advised hydration Anti-emetic to help with eating/drinking Pt reports negative covid test - though did discuss if new symptoms should repeat testing given community  rise.   Return if symptoms worsen or fail to improve.  Lesleigh Noe, MD

## 2020-02-29 ENCOUNTER — Encounter: Payer: Self-pay | Admitting: Family Medicine

## 2020-02-29 ENCOUNTER — Telehealth (INDEPENDENT_AMBULATORY_CARE_PROVIDER_SITE_OTHER): Payer: Commercial Managed Care - PPO | Admitting: Family Medicine

## 2020-02-29 ENCOUNTER — Other Ambulatory Visit: Payer: Self-pay

## 2020-02-29 VITALS — BP 157/98 | Temp 98.6°F | Ht 62.0 in | Wt 115.0 lb

## 2020-02-29 DIAGNOSIS — R197 Diarrhea, unspecified: Secondary | ICD-10-CM | POA: Diagnosis not present

## 2020-02-29 DIAGNOSIS — J0100 Acute maxillary sinusitis, unspecified: Secondary | ICD-10-CM | POA: Diagnosis not present

## 2020-02-29 MED ORDER — AZITHROMYCIN 250 MG PO TABS: ORAL_TABLET | ORAL | 0 refills | Status: DC

## 2020-02-29 NOTE — Progress Notes (Signed)
Virtual Visit via Video Note  I connected with Maria [name] on 02/29/20 at  8:00 AM EDT by a video enabled telemedicine application and verified that I am speaking with the correct person using two identifiers.  Location: Patient: In her home Provider: [place] Persons participating in virtual visit: Patient, provider   I discussed the limitations of evaluation and management by telemedicine and the availability of in person appointments. The patient expressed understanding and agreed to proceed.  History of Present Illness: Chief Complaint  Patient presents with  . Follow-up    not feeling any better. Loss of appetite, bodyaches, nasal congestion, and fatigue.  This is a 49 year old female who presents today for virtual visit with above chief complaint.  She had a virtual visit 02/19/2020 and was diagnosed with sinusitis and given Augmentin and Flonase.  She has been taking Augmentin for greater than 1 week.  She reports that she continues to have sinus pressure under her eyes, nasal congestion, fatigue.  She has noticed decreased appetite with frequent loose bowel movements, 4-5 a day.  She had a virtual visit 02/22/2020 and was diagnosed with gastroenteritis.  She was giving ondansetron.  She is not having any vomiting.  There is no blood or mucus in her bowel movements.  She denies fever, ear pain, sore throat, postnasal drainage, cough, wheeze, shortness of breath.  Additionally, she is taking Mucinex.  She admits to poor fluid intake.  She smokes 1 pack/day. She did a rapid Covid test this morning and reports that it was negative.  She works at [employer]    Observations/Objective: Patient is alert and answers questions appropriately.  Visible skin is unremarkable.  Respirations are even and unlabored without increased work of breathing.  No audible wheeze or witnessed cough.  Mood and affect are appropriate. BP (!) 157/98   Temp 98.6 F (37 C)   Ht 5\' 2"  (1.575 m)   Wt 115 lb  (52.2 kg)   LMP 02/24/2018 (Approximate) Comment: spotting   BMI 21.03 kg/m  Wt Readings from Last 3 Encounters:  02/29/20 115 lb (52.2 kg)  02/19/20 120 lb (54.4 kg)  12/09/19 126 lb (57.2 kg)     Assessment and Plan: 1. Acute non-recurrent maxillary sinusitis -We will change antibiotic for improved atypical coverage given her smoking history and no improvement with Augmentin. -Continue fluticasone, Mucinex, can add Afrin type nasal spray twice a day for 3 days max -Increase fluids -Follow-up if no improvement in 3 to 5 days, or if worsening symptoms - azithromycin (ZITHROMAX) 250 MG tablet; Take 2 tabs PO x 1 dose, then 1 tab PO QD x 4 days  Dispense: 6 tablet; Refill: 0  2. Diarrhea, unspecified type -Discussed bland diet, adequate liquids, follow-up if no improvement   Clarene Reamer, FNP-BC  [facility] [place]  02/29/2020 8:20 AM   Follow Up Instructions:    I discussed the assessment and treatment plan with the patient. The patient was provided an opportunity to ask questions and all were answered. The patient agreed with the plan and demonstrated an understanding of the instructions.   The patient was advised to call back or seek an in-person evaluation if the symptoms worsen or if the condition fails to improve as anticipated.    Elby Beck, FNP

## 2020-04-11 ENCOUNTER — Ambulatory Visit: Payer: Commercial Managed Care - PPO | Admitting: Dermatology

## 2020-04-20 ENCOUNTER — Telehealth: Payer: Self-pay | Admitting: Primary Care

## 2020-04-20 NOTE — Telephone Encounter (Signed)
Pt called wanting to know when her last cpx was  07/30/19.  She needs letter stating when she had her last cpx.  Please send letter through my chart

## 2020-04-26 ENCOUNTER — Encounter: Payer: Self-pay | Admitting: Primary Care

## 2020-04-26 NOTE — Telephone Encounter (Signed)
Letter in chart for patient.

## 2020-05-03 ENCOUNTER — Other Ambulatory Visit: Payer: Self-pay

## 2020-05-03 ENCOUNTER — Other Ambulatory Visit: Payer: Commercial Managed Care - PPO

## 2020-05-03 ENCOUNTER — Other Ambulatory Visit: Payer: Self-pay | Admitting: Family Medicine

## 2020-05-03 ENCOUNTER — Telehealth (INDEPENDENT_AMBULATORY_CARE_PROVIDER_SITE_OTHER): Payer: Commercial Managed Care - PPO | Admitting: Family Medicine

## 2020-05-03 ENCOUNTER — Encounter: Payer: Self-pay | Admitting: Family Medicine

## 2020-05-03 VITALS — BP 176/88 | HR 68 | Temp 98.2°F | Wt 126.0 lb

## 2020-05-03 DIAGNOSIS — Z20822 Contact with and (suspected) exposure to covid-19: Secondary | ICD-10-CM

## 2020-05-03 DIAGNOSIS — I10 Essential (primary) hypertension: Secondary | ICD-10-CM | POA: Diagnosis not present

## 2020-05-03 DIAGNOSIS — J0140 Acute pansinusitis, unspecified: Secondary | ICD-10-CM | POA: Diagnosis not present

## 2020-05-03 MED ORDER — AMOXICILLIN-POT CLAVULANATE 875-125 MG PO TABS: 1.0000 | ORAL_TABLET | Freq: Two times a day (BID) | ORAL | 0 refills | Status: AC

## 2020-05-03 NOTE — Assessment & Plan Note (Signed)
BP elevated consistently at last few visits. Previously on medication but failed f/u. Strongly encouraged pcp f/u in 1-2 weeks to check in on bp. She will schedule

## 2020-05-03 NOTE — Progress Notes (Signed)
PT called and scheduled for a covid test this afternoon.

## 2020-05-03 NOTE — Progress Notes (Signed)
I connected with Maria [name] on 05/03/20 at 12:20 PM EDT by video and verified that I am speaking with the correct person using two identifiers.   I discussed the limitations, risks, security and privacy concerns of performing an evaluation and management service by video and the availability of in person appointments. I also discussed with the patient that there may be a patient responsible charge related to this service. The patient expressed understanding and agreed to proceed.  Patient location: Home Provider Location: [place] Participants: Maria [name] and Maria [name]   Subjective:     Maria [name] is a 49 y.o. female presenting for Sinusitis (x 5 days ) and Nasal Congestion (x 5 days )     Sinusitis This is a new problem. The current episode started in the past 7 days. The problem is unchanged. There has been no fever. Associated symptoms include congestion. Pertinent negatives include no chills, coughing, ear pain, headaches, shortness of breath, sinus pressure, sneezing or sore throat. Past treatments include oral decongestants (sudafed at night, dayquil during the day). The treatment provided mild relief.   No loss of taste or smell No sick contact  Has not been tested but will require test before return to work  Got 2 doses of moderna  Review of Systems  Constitutional: Negative for chills.  HENT: Positive for congestion. Negative for ear pain, sinus pressure, sneezing and sore throat.   Respiratory: Negative for cough and shortness of breath.   Neurological: Negative for headaches.     Social History   Tobacco Use  Smoking Status Current Every Day Smoker  . Packs/day: 1.00  . Years: 20.00  . Pack years: 20.00  . Types: Cigarettes  Smokeless Tobacco Never Used        Objective:   BP Readings from Last 3 Encounters:  05/03/20 (!) 176/88  02/29/20 (!) 157/98  02/22/20 (!) 164/91   Wt Readings from Last 3 Encounters:  05/03/20  126 lb (57.2 kg)  02/29/20 115 lb (52.2 kg)  02/19/20 120 lb (54.4 kg)   BP (!) 176/88   Pulse 68   Temp 98.2 F (36.8 C) (Oral)   Wt 126 lb (57.2 kg)   LMP 02/24/2018 (Approximate) Comment: spotting   BMI 23.05 kg/m   Physical Exam Constitutional:      Appearance: Normal appearance. She is not ill-appearing.  HENT:     Head: Normocephalic and atraumatic.     Right Ear: External ear normal.     Left Ear: External ear normal.  Eyes:     Conjunctiva/sclera: Conjunctivae normal.  Pulmonary:     Effort: Pulmonary effort is normal. No respiratory distress.  Neurological:     Mental Status: She is alert. Mental status is at baseline.  Psychiatric:        Mood and Affect: Mood normal.        Behavior: Behavior normal.        Thought Content: Thought content normal.        Judgment: Judgment normal.           Assessment & Plan:   Problem List Items Addressed This Visit      Cardiovascular and Mediastinum   Essential hypertension    BP elevated consistently at last few visits. Previously on medication but failed f/u. Strongly encouraged pcp f/u in 1-2 weeks to check in on bp. She will schedule       Other Visit Diagnoses    Acute non-recurrent  pansinusitis    -  Primary   Relevant Medications   Phenylephrine HCl (SUDAFED PE SINUS CONGESTION PO)   Phenylephrine-APAP-guaiFENesin (MUCINEX SINUS-MAX PO)   amoxicillin-clavulanate (AUGMENTIN) 875-125 MG tablet     Will get covid test to rule out, quarantine until results are back Discussed waiting 2 days to start abx as symptoms can resolve w/in 7 days off abx if persisting  No follow-ups on file.  Maria Noe, MD

## 2020-05-03 NOTE — Progress Notes (Signed)
Virtual visit this morning Will come in for testing this afternoon

## 2020-05-04 LAB — SARS-COV-2, NAA 2 DAY TAT

## 2020-05-04 LAB — NOVEL CORONAVIRUS, NAA: SARS-CoV-2, NAA: NOT DETECTED

## 2020-05-09 ENCOUNTER — Telehealth: Payer: Self-pay | Admitting: Primary Care

## 2020-05-09 NOTE — Telephone Encounter (Signed)
-----   Message from Lesleigh Noe, MD sent at 05/03/2020  1:25 PM EDT ----- I told her to f/u for bp, but also wanted to send you both and FYI to make sure she schedules with Anda Kraft

## 2020-05-09 NOTE — Telephone Encounter (Signed)
Noted  

## 2020-05-09 NOTE — Telephone Encounter (Signed)
Called patient and scheduled for 11/17. Sending to PCP as FYI.

## 2020-05-18 ENCOUNTER — Encounter: Payer: Self-pay | Admitting: Primary Care

## 2020-05-18 ENCOUNTER — Other Ambulatory Visit: Payer: Self-pay

## 2020-05-18 ENCOUNTER — Ambulatory Visit: Payer: Commercial Managed Care - PPO | Admitting: Primary Care

## 2020-05-18 VITALS — BP 130/78 | HR 99 | Temp 98.6°F | Ht 62.0 in | Wt 120.0 lb

## 2020-05-18 DIAGNOSIS — Z114 Encounter for screening for human immunodeficiency virus [HIV]: Secondary | ICD-10-CM

## 2020-05-18 DIAGNOSIS — R002 Palpitations: Secondary | ICD-10-CM

## 2020-05-18 DIAGNOSIS — D529 Folate deficiency anemia, unspecified: Secondary | ICD-10-CM

## 2020-05-18 DIAGNOSIS — Z889 Allergy status to unspecified drugs, medicaments and biological substances status: Secondary | ICD-10-CM

## 2020-05-18 DIAGNOSIS — R229 Localized swelling, mass and lump, unspecified: Secondary | ICD-10-CM

## 2020-05-18 DIAGNOSIS — E538 Deficiency of other specified B group vitamins: Secondary | ICD-10-CM

## 2020-05-18 DIAGNOSIS — I10 Essential (primary) hypertension: Secondary | ICD-10-CM

## 2020-05-18 DIAGNOSIS — Z1159 Encounter for screening for other viral diseases: Secondary | ICD-10-CM

## 2020-05-18 HISTORY — DX: Palpitations: R00.2

## 2020-05-18 LAB — FOLATE: Folate: 3.5 ng/mL — ABNORMAL LOW (ref >5.9)

## 2020-05-18 LAB — CBC
HCT: 40.1 % (ref 36.0–46.0)
Hemoglobin: 14.2 g/dL (ref 12.0–15.0)
MCHC: 35.3 g/dL (ref 30.0–36.0)
MCV: 113.6 fl — ABNORMAL HIGH (ref 78.0–100.0)
Platelets: 109 10*3/uL — ABNORMAL LOW (ref 150.0–400.0)
RBC: 3.53 Mil/uL — ABNORMAL LOW (ref 3.87–5.11)
RDW: 13.9 % (ref 11.5–15.5)
WBC: 5.6 10*3/uL (ref 4.0–10.5)

## 2020-05-18 LAB — VITAMIN B12: Vitamin B-12: 490 pg/mL (ref 211–911)

## 2020-05-18 NOTE — Assessment & Plan Note (Addendum)
Not currently on any supplements for this, rechecking B12 today.   Labs pending.

## 2020-05-18 NOTE — Assessment & Plan Note (Addendum)
Acute over the last 2 weeks at same time she began nighty use of Sudafed. Only occurs at night and last 1-2 minutes.   Discontinue sudafed.  EKG done in office today with no changes from previous EKG in 2019. Sinus brady rate of 57. T wave inversion noted and consistent with previous EKG.  Labs pending to rule out anemia and monitor folate deficiency   She will update if palpitations continue despite discontinuation of Sudafed.  If this is the case then we will send her to cardiology for evaluation.  Agree with assessment and plan. [name], NP

## 2020-05-18 NOTE — Assessment & Plan Note (Addendum)
Repeating folate, b12 & cbc today.   Labs pending  Agree with assessment and plan. [name], NP

## 2020-05-18 NOTE — Assessment & Plan Note (Addendum)
BP Readings from Last 3 Encounters:  05/18/20 130/78  05/03/20 (!) 176/88  02/29/20 (!) 157/98   Blood pressure better controlled in office today. Has had elevated home reading during previous virtual visits.  Discussed to stop Sudafed as this could contribute to elevated blood pressure readings.  Will have her continue to monitor at home and follow up if consistently greater than 130/80.    Agree with assessment and plan. [name], NP

## 2020-05-18 NOTE — Progress Notes (Signed)
   Subjective:    Patient ID: Maria [name], female    DOB: 16-Apr-1971, 49 y.o.   MRN: [id number]  HPI   This visit occurred during the SARS-CoV-2 public health emergency.  Safety protocols were in place, including screening questions prior to the visit, additional usage of staff PPE, and extensive cleaning of exam room while observing appropriate contact time as indicated for disinfecting solutions.   Maria [name] is a 49 year old female with a history of hypertension, peripheral neuropathy, folate deficiency anemia and fatigue who presents today to follow up on evaluated blood pressure.   BP Readings from Last 3 Encounters:  05/18/20 130/78  05/03/20 (!) 176/88  02/29/20 (!) 157/98   She is here today after having some elevated home reading during her last to virtual visits. She is used a home automated blood pressure cuff that she does not feel is accurate. She checks her blood pressure infrequently at home and normally when she is feeling anxious. Reports readings around 130's-70s when she has someone take it at work.    She has noticed palpitations when she lays down to got to bed over the last 1-2 weeks. This is intermittent and lasts maybe 1-2 minutes. She describes this has her "heart is pumping hard". This occurs almost every other night. Recent increase in stress at work. If she lays still this will resolve and able to go to sleep. She began taking nightly sudafed at bedtime 2 weeks ago.   Review of Systems  Constitutional: Negative.   HENT: Negative.   Respiratory: Negative.  Negative for chest tightness and shortness of breath.   Cardiovascular: Negative.   Neurological: Negative.  Negative for dizziness, light-headedness, numbness and headaches.  Hematological: Negative.   Psychiatric/Behavioral: Negative.        Objective:   Physical Exam Constitutional:      Appearance: Normal appearance.  Cardiovascular:     Rate and Rhythm: Normal rate and regular rhythm.      Pulses: Normal pulses.     Heart sounds: Normal heart sounds.  Pulmonary:     Effort: Pulmonary effort is normal.     Breath sounds: Normal breath sounds.  Musculoskeletal:        General: Normal range of motion.     Cervical back: Normal range of motion.  Skin:    General: Skin is warm.     Capillary Refill: Capillary refill takes less than 2 seconds.  Neurological:     General: No focal deficit present.     Mental Status: She is alert.  Psychiatric:        Mood and Affect: Mood normal.           Assessment & Plan:

## 2020-05-18 NOTE — Progress Notes (Signed)
Subjective:    Patient ID: Maria [name], female    DOB: 11/02/1970, 49 y.o.   MRN: [id number]  HPI  This visit occurred during the SARS-CoV-2 public health emergency.  Safety protocols were in place, including screening questions prior to the visit, additional usage of staff PPE, and extensive cleaning of exam room while observing appropriate contact time as indicated for disinfecting solutions.   Maria [name] is a 49 year old female with a history of elevated blood pressure reading and a chief complaint of palpitations.  She's undergone several virtual visits recently with home blood pressure readings between 150-170's/80's. She works at [employer], has the nurses check her BP at work which runs 130's/60's.   She's mostly concerned about palpitations which began about 2 weeks ago, occurring every other evening only while laying down to go to sleep. Palpations will last for minutes and resolve sporadically. She has been under increased stress at work with a big move. Also has been taking Sudafed PE for the last 2 weeks for ongoing sinus pressure/allergies.   She denies shortness of breath, chest pain, dizziness.   BP Readings from Last 3 Encounters:  05/18/20 130/78  05/03/20 (!) 176/88  02/29/20 (!) 157/98     Review of Systems  Constitutional: Negative for fever.  HENT: Positive for congestion and sinus pressure.   Respiratory: Negative for shortness of breath.   Cardiovascular: Positive for palpitations. Negative for chest pain.  Allergic/Immunologic: Positive for environmental allergies.  Psychiatric/Behavioral: The patient is nervous/anxious.        Past Medical History:  Diagnosis Date  . Anemia   . Anxiety    h/o  . Depression    h/o  . Family history of adverse reaction to anesthesia    mom-hard time waking up  . GERD (gastroesophageal reflux disease)    tums prn  . Hypertension    WAS PUT ON BP MED BY PCP LAST YEAR (2018) AND BP MED MADE PT SICK SO SHE STOPPED  TAKING IT-PCP MONITORS BP NOW  . Pancreatitis      Social History   Socioeconomic History  . Marital status: Married    Spouse name: Not on file  . Number of children: Not on file  . Years of education: Not on file  . Highest education level: Not on file  Occupational History  . Not on file  Tobacco Use  . Smoking status: Current Every Day Smoker    Packs/day: 1.00    Years: 20.00    Pack years: 20.00    Types: Cigarettes  . Smokeless tobacco: Never Used  Vaping Use  . Vaping Use: Never used  Substance and Sexual Activity  . Alcohol use: Yes    Alcohol/week: 2.0 standard drinks    Types: 2 Glasses of wine per week    Comment: two glasses wine/beer every other day  . Drug use: No  . Sexual activity: Yes  Other Topics Concern  . Not on file  Social History Narrative   Married.   1 child.    Works at [employer].   Enjoys swimming, camping   Social Determinants of Health   Financial Resource Strain:   . Difficulty of Paying Living Expenses: Not on file  Food Insecurity:   . Worried About Charity fundraiser in the Last Year: Not on file  . Ran Out of Food in the Last Year: Not on file  Transportation Needs:   . Lack of Transportation (Medical): Not on  file  . Lack of Transportation (Non-Medical): Not on file  Physical Activity:   . Days of Exercise per Week: Not on file  . Minutes of Exercise per Session: Not on file  Stress:   . Feeling of Stress : Not on file  Social Connections:   . Frequency of Communication with Friends and Family: Not on file  . Frequency of Social Gatherings with Friends and Family: Not on file  . Attends Religious Services: Not on file  . Active Member of Clubs or Organizations: Not on file  . Attends Archivist Meetings: Not on file  . Marital Status: Not on file  Intimate Partner Violence:   . Fear of Current or Ex-Partner: Not on file  . Emotionally Abused: Not on file  . Physically Abused: Not on file  . Sexually  Abused: Not on file    Past Surgical History:  Procedure Laterality Date  . ANTERIOR AND POSTERIOR REPAIR WITH SACROSPINOUS FIXATION N/A 03/13/2018   Procedure: ANTERIOR REPAIR;  Surgeon: [name], MD;  Location: [facility];  Service: Gynecology;  Laterality: N/A;  . INDUCED ABORTION     x2  . VAGINAL HYSTERECTOMY N/A 03/13/2018   Procedure: HYSTERECTOMY VAGINAL;  Surgeon: [name], MD;  Location: [facility];  Service: Gynecology;  Laterality: N/A;    Family History  Problem Relation Age of Onset  . Arthritis Mother   . Arthritis Father   . Alcohol abuse Father   . Hypertension Father   . Heart disease Father   . Aneurysm Father   . Breast cancer Maternal Grandmother   . Breast cancer Paternal Grandmother     Allergies  Allergen Reactions  . Bee Venom Swelling  . Codeine Swelling  . Bupropion Anxiety and Other (See Comments)    Worsened anxiety    Current Outpatient Medications on File Prior to Visit  Medication Sig Dispense Refill  . EPINEPHrine 0.3 mg/0.3 mL IJ SOAJ injection Inject 0.3 mLs (0.3 mg total) into the muscle as needed for anaphylaxis. 1 Device 0  . hydrOXYzine (ATARAX/VISTARIL) 10 MG tablet Take 1-2 tablets (10-20 mg total) by mouth 3 (three) times daily as needed for itching. 20 tablet 0  . Phenylephrine HCl (SUDAFED PE SINUS CONGESTION PO) Take 10 mg by mouth at bedtime as needed.    Marland Kitchen Phenylephrine-APAP-guaiFENesin (MUCINEX SINUS-MAX PO) Take by mouth as needed.     No current facility-administered medications on file prior to visit.    BP 130/78   Pulse 99   Temp 98.6 F (37 C) (Temporal)   Ht 5\' 2"  (1.575 m)   Wt 120 lb (54.4 kg)   LMP 02/24/2018 (Approximate) Comment: spotting   SpO2 99%   BMI 21.95 kg/m    Objective:   Physical Exam Cardiovascular:     Rate and Rhythm: Normal rate and regular rhythm.  Pulmonary:     Effort: Pulmonary effort is normal.     Breath sounds: Normal breath sounds.  Musculoskeletal:     Cervical  back: Neck supple.  Skin:    General: Skin is warm and dry.            Assessment & Plan:

## 2020-05-18 NOTE — Patient Instructions (Addendum)
°  Stop by the lab prior to leaving today. I will notify you of your results once received.   Stop your Sudafed today and update me in 1-2 week if these palpations continue.   We will be in touch regarding your lab work.   It was a pleasure to see you today!    Health Maintenance Due  Topic Date Due   Hepatitis C Screening will get today if labs ordered  Never done   HIV Screening   Will get today if labs ordered  Never done      Recommended follow up: No follow-ups on file.

## 2020-05-18 NOTE — Assessment & Plan Note (Addendum)
Currently taking nightly sudafed as well as mucinex she was prescribed 2 weeks ago due to acute URI.  Discontinue these medications due to palpations.  May use OTC Flonase & Claritin.

## 2020-05-18 NOTE — Assessment & Plan Note (Signed)
Present to left side of face beside left eye    Referral sent for dermatology.

## 2020-05-19 LAB — HIV ANTIBODY (ROUTINE TESTING W REFLEX): HIV 1&2 Ab, 4th Generation: NONREACTIVE

## 2020-05-19 LAB — HEPATITIS C ANTIBODY
Hepatitis C Ab: NONREACTIVE
SIGNAL TO CUT-OFF: 0.01 (ref <1.00)

## 2020-05-20 ENCOUNTER — Other Ambulatory Visit: Payer: Self-pay | Admitting: Primary Care

## 2020-05-20 DIAGNOSIS — D529 Folate deficiency anemia, unspecified: Secondary | ICD-10-CM

## 2020-05-20 DIAGNOSIS — D696 Thrombocytopenia, unspecified: Secondary | ICD-10-CM

## 2020-06-01 ENCOUNTER — Inpatient Hospital Stay: Payer: Commercial Managed Care - PPO

## 2020-06-01 ENCOUNTER — Inpatient Hospital Stay: Payer: Commercial Managed Care - PPO | Attending: Oncology | Admitting: Oncology

## 2020-06-14 NOTE — Telephone Encounter (Signed)
Called patient she will go to an urgent care for rapid testing. She will call our office if any issues.

## 2020-06-15 ENCOUNTER — Telehealth: Payer: Self-pay | Admitting: *Deleted

## 2020-06-15 NOTE — Telephone Encounter (Signed)
Noted, agree to take her prior dose of lisinopril at night for now. No reduced dose. Needs to come see me for follow up so we can discuss medication side effects and changes, need to make sure she is Covid-19 negative.   If she's positive then needs a virtual visit.  Need to work her in this week.

## 2020-06-15 NOTE — Telephone Encounter (Signed)
Patient called stating that she works at [employer] and saw one of their doctors today.  Patient stated that her blood pressure was 178/80. Patient stated that she had stopped her Lisinopril several months ago because it was causing her to be really tired. Patient stated that [name] NP was aware of her stopping the Lisinopril. Patient stated that the doctor today told her she should start back on the Lisinopril and maybe take it at night with her dinner meal. Patient stated that she was wondering if she could cut the Lisinopril in half but it is not scored and was wondering if Lisinopril comes in a 5 mg pill. Patient stated that she has Lisinopril pills on hand to take. Patient stated that she was possibly exposed to covid at work and because she was feeling tired she was sent to [place] for covid.  Patient denies any other covid symptoms. Patient was given  ER precautions and she verbalized understanding.

## 2020-06-16 NOTE — Telephone Encounter (Signed)
Called patient and left detailed message as per DPR to schedule appointment.

## 2020-06-21 NOTE — Telephone Encounter (Signed)
Called patient again and LVM. Letter sent.

## 2020-08-08 ENCOUNTER — Other Ambulatory Visit: Payer: Self-pay

## 2020-08-08 ENCOUNTER — Encounter: Payer: Commercial Managed Care - PPO | Admitting: Primary Care

## 2020-08-09 ENCOUNTER — Other Ambulatory Visit: Payer: Self-pay

## 2020-08-09 ENCOUNTER — Encounter: Payer: Commercial Managed Care - PPO | Admitting: Primary Care

## 2020-08-09 NOTE — Progress Notes (Signed)
Patient not seen, she cancelled her appointment today

## 2020-08-10 NOTE — Progress Notes (Signed)
Patient not seen, she cancelled appointment.

## 2020-08-19 ENCOUNTER — Other Ambulatory Visit: Payer: Self-pay

## 2020-08-19 ENCOUNTER — Encounter: Payer: Self-pay | Admitting: Internal Medicine

## 2020-08-19 ENCOUNTER — Ambulatory Visit: Payer: Commercial Managed Care - PPO | Admitting: Internal Medicine

## 2020-08-19 DIAGNOSIS — K2970 Gastritis, unspecified, without bleeding: Secondary | ICD-10-CM

## 2020-08-19 HISTORY — DX: Gastritis, unspecified, without bleeding: K29.70

## 2020-08-19 NOTE — Patient Instructions (Signed)
Please take omeprazole 20mg  daily on an empty stomach for 2 weeks. If the stomach pain continues, I would set you up with a GI specialist.

## 2020-08-19 NOTE — Progress Notes (Signed)
Subjective:    Patient ID: Maria [name], female    DOB: 19-Jan-1971, 50 y.o.   MRN: [id number]  HPI Here due to stomach trouble This visit occurred during the SARS-CoV-2 public health emergency.  Safety protocols were in place, including screening questions prior to the visit, additional usage of staff PPE, and extensive cleaning of exam room while observing appropriate contact time as indicated for disinfecting solutions.   Having pain ---points to epigastrium Appetite is off Has been vomiting ---nausea after eating Started 4 days ago  Did have a couple of rich meals over the weekend Nausea worst in the morning Has to eat very light--heavy if she has more that a cracker or two Okay with fluids No blood in vomitus---just bilious  Given augmentin for possible sinus infection Symptoms started after this  Has tried some pepto bismol---after eating   Current Outpatient Medications on File Prior to Visit  Medication Sig Dispense Refill  . EPINEPHrine 0.3 mg/0.3 mL IJ SOAJ injection Inject 0.3 mLs (0.3 mg total) into the muscle as needed for anaphylaxis. 1 Device 0  . hydrOXYzine (ATARAX/VISTARIL) 10 MG tablet Take 1-2 tablets (10-20 mg total) by mouth 3 (three) times daily as needed for itching. 20 tablet 0  . Phenylephrine HCl (SUDAFED PE SINUS CONGESTION PO) Take 10 mg by mouth at bedtime as needed.    Marland Kitchen Phenylephrine-APAP-guaiFENesin (MUCINEX SINUS-MAX PO) Take by mouth as needed.     No current facility-administered medications on file prior to visit.    Allergies  Allergen Reactions  . Bee Venom Swelling  . Codeine Swelling  . Bupropion Anxiety and Other (See Comments)    Worsened anxiety    Past Medical History:  Diagnosis Date  . Anemia   . Anxiety    h/o  . Depression    h/o  . Family history of adverse reaction to anesthesia    mom-hard time waking up  . GERD (gastroesophageal reflux disease)    tums prn  . Hypertension    WAS PUT ON BP MED BY PCP LAST  YEAR (2018) AND BP MED MADE PT SICK SO SHE STOPPED TAKING IT-PCP MONITORS BP NOW  . Pancreatitis     Past Surgical History:  Procedure Laterality Date  . ANTERIOR AND POSTERIOR REPAIR WITH SACROSPINOUS FIXATION N/A 03/13/2018   Procedure: ANTERIOR REPAIR;  Surgeon: [name], MD;  Location: [facility];  Service: Gynecology;  Laterality: N/A;  . INDUCED ABORTION     x2  . VAGINAL HYSTERECTOMY N/A 03/13/2018   Procedure: HYSTERECTOMY VAGINAL;  Surgeon: [name], MD;  Location: [facility];  Service: Gynecology;  Laterality: N/A;    Family History  Problem Relation Age of Onset  . Arthritis Mother   . Arthritis Father   . Alcohol abuse Father   . Hypertension Father   . Heart disease Father   . Aneurysm Father   . Breast cancer Maternal Grandmother   . Breast cancer Paternal Grandmother     Social History   Socioeconomic History  . Marital status: Married    Spouse name: Not on file  . Number of children: Not on file  . Years of education: Not on file  . Highest education level: Not on file  Occupational History  . Not on file  Tobacco Use  . Smoking status: Current Every Day Smoker    Packs/day: 1.00    Years: 20.00    Pack years: 20.00    Types: Cigarettes  . Smokeless tobacco:  Never Used  Vaping Use  . Vaping Use: Never used  Substance and Sexual Activity  . Alcohol use: Yes    Alcohol/week: 2.0 standard drinks    Types: 2 Glasses of wine per week    Comment: two glasses wine/beer every other day  . Drug use: No  . Sexual activity: Yes  Other Topics Concern  . Not on file  Social History Narrative   Married.   1 child.    Works at [employer].   Enjoys swimming, camping   Social Determinants of Radio broadcast assistant Strain: Not on file  Food Insecurity: Not on file  Transportation Needs: Not on file  Physical Activity: Not on file  Stress: Not on file  Social Connections: Not on file  Intimate Partner Violence: Not on file   Review of  Systems No change in bowels---goes daily No blood in stool No fever No regular heartburn No dysphagia Regular alcohol---most is 2 at a time No consistent NSAID use--but probably 2 daily for the past week No cough or SOB    Objective:   Physical Exam Constitutional:      Appearance: Normal appearance.  Pulmonary:     Effort: Pulmonary effort is normal.     Breath sounds: Normal breath sounds. No wheezing or rales.  Abdominal:     Palpations: There is no mass.     Tenderness: There is no guarding or rebound.     Comments: Mild epigastric tenderness  Musculoskeletal:     Cervical back: Neck supple.  Lymphadenopathy:     Cervical: No cervical adenopathy.  Neurological:     Mental Status: She is alert.            Assessment & Plan:

## 2020-08-19 NOTE — Assessment & Plan Note (Signed)
Likely most from the augmentin Ibuprofen may be adding Should resolve on its own Will have her take omeprazole daily for 2 weeks Consider GI evaluation if persists

## 2020-09-01 ENCOUNTER — Encounter: Payer: Self-pay | Admitting: Dermatology

## 2020-09-01 ENCOUNTER — Other Ambulatory Visit: Payer: Self-pay

## 2020-09-01 ENCOUNTER — Ambulatory Visit: Payer: Commercial Managed Care - PPO | Admitting: Dermatology

## 2020-09-01 DIAGNOSIS — L720 Epidermal cyst: Secondary | ICD-10-CM

## 2020-09-01 NOTE — Patient Instructions (Signed)
   Pre-Operative Instructions  You are scheduled for a surgical procedure at [facility]. We recommend you read the following instructions. If you have any questions or concerns, please call the office at [phone].  1. Shower and wash the entire body with soap and water the day of your surgery paying special attention to cleansing at and around the planned surgery site.  2. Avoid aspirin or aspirin containing products at least fourteen (14) days prior to your surgical procedure and for at least one week (7 Days) after your surgical procedure. If you take aspirin on a regular basis for heart disease or history of stroke or for any other reason, we may recommend you continue taking aspirin but please notify us if you take this on a regular basis. Aspirin can cause more bleeding to occur during surgery as well as prolonged bleeding and bruising after surgery.   3. Avoid other nonsteroidal pain medications at least one week prior to surgery and at least one week prior to your surgery. These include medications such as Ibuprofen (Motrin, Advil and Nuprin), Naprosyn, Voltaren, Relafen, etc. If medications are used for therapeutic reasons, please inform us as they can cause increased bleeding or prolonged bleeding during and bruising after surgical procedures.   4. Please advise [place] if you are taking any "blood thinner" medications such as Coumadin or Dipyridamole or Plavix or similar medications. These cause increased bleeding and prolonged bleeding during procedures and bruising after surgical procedures. We may have to consider discontinuing these medications briefly prior to and shortly after your surgery if safe to do so.   5. Please inform us of all medications you are currently taking. All medications that are taken regularly should be taken the day of surgery as you always do. Nevertheless, we need to be informed of what medications you are taking prior to surgery to know whether they will  affect the procedure or cause any complications.   6. Please inform us of any medication allergies. Also inform us of whether you have allergies to Latex or rubber products or whether you have had any adverse reaction to Lidocaine or Epinephrine.  7. Please inform us of any prosthetic or artificial body parts such as artificial heart valve, joint replacements, etc., or similar condition that might require preoperative antibiotics.   8. We recommend avoidance of alcohol at least two weeks prior to surgery and continued avoidance for at least two weeks after surgery.   9. We recommend discontinuation of tobacco smoking at least two weeks prior to surgery and continued abstinence for at least two weeks after surgery.  10. Do not plan strenuous exercise, strenuous work or strenuous lifting for approximately four weeks after your surgery.   11. We request if you are unable to make your scheduled surgical appointment, please call us at least a week in advance or as soon as you are aware of a problem so that we can cancel or reschedule the appointment.   12. You MAY TAKE TYLENOL (acetaminophen) for pain as it is not a blood thinner.   13. PLEASE PLAN TO BE IN TOWN FOR TWO WEEKS FOLLOWING SURGERY, THIS IS IMPORTANT SO YOU CAN BE CHECKED FOR DRESSING CHANGES, SUTURE REMOVAL AND TO MONITOR FOR POSSIBLE COMPLICATIONS.

## 2020-09-01 NOTE — Progress Notes (Signed)
   New Patient Visit  Subjective  Maria [name] is a 50 y.o. female who presents for the following: Spot Check (Pt has a spot on her left temple that has been there for long time. She states that there has been some gradual growth over the years. She denies any pain and nothing has ever drained from the spot. No hx of skin cancer or dysplastic nevi. ).  Objective  Well appearing patient in no apparent distress; mood and affect are within normal limits.  A focused examination was performed including face. Relevant physical exam findings are noted in the Assessment and Plan.  Objective  left brow/lateral canthus upper eyelid: 1.5 cm subcutaneous nodule.    Assessment & Plan  Epidermal cyst left brow/lateral canthus upper eyelid  Discussed in office surgical excision to remove. Discussed possibility of recurrence.  Discussed risk of swelling of the eye and nerve damage of the superficial temporal nerve. Patient is not on any blood thinners. sHe was given preop form.  Pt will schedule.   Return for first available surgery for cyst excision.   IHarriett Sine, CMA, am acting as scribe for Sarina Ser, MD.  Documentation: I have reviewed the above documentation for accuracy and completeness, and I agree with the above.  Sarina Ser, MD

## 2020-09-04 ENCOUNTER — Emergency Department: Payer: Commercial Managed Care - PPO

## 2020-09-04 ENCOUNTER — Emergency Department
Admission: EM | Admit: 2020-09-04 | Discharge: 2020-09-05 | Disposition: A | Discharge status: Home / Self Care | Payer: Commercial Managed Care - PPO | Attending: Emergency Medicine | Admitting: Emergency Medicine

## 2020-09-04 ENCOUNTER — Other Ambulatory Visit: Payer: Self-pay

## 2020-09-04 DIAGNOSIS — T7840XA Allergy, unspecified, initial encounter: Secondary | ICD-10-CM | POA: Insufficient documentation

## 2020-09-04 DIAGNOSIS — I10 Essential (primary) hypertension: Secondary | ICD-10-CM | POA: Insufficient documentation

## 2020-09-04 DIAGNOSIS — L509 Urticaria, unspecified: Secondary | ICD-10-CM | POA: Diagnosis not present

## 2020-09-04 DIAGNOSIS — X58XXXA Exposure to other specified factors, initial encounter: Secondary | ICD-10-CM | POA: Diagnosis not present

## 2020-09-04 DIAGNOSIS — F17210 Nicotine dependence, cigarettes, uncomplicated: Secondary | ICD-10-CM | POA: Insufficient documentation

## 2020-09-04 LAB — BASIC METABOLIC PANEL
Anion gap: 12 (ref 5–15)
BUN: 10 mg/dL (ref 6–20)
CO2: 22 mmol/L (ref 22–32)
Calcium: 9.4 mg/dL (ref 8.9–10.3)
Chloride: 107 mmol/L (ref 98–111)
Creatinine, Ser: 0.57 mg/dL (ref 0.44–1.00)
GFR, Estimated: >60 mL/min (ref >60)
Glucose, Bld: 164 mg/dL — ABNORMAL HIGH (ref 70–99)
Potassium: 2.6 mmol/L — CL (ref 3.5–5.1)
Sodium: 141 mmol/L (ref 135–145)

## 2020-09-04 MED ORDER — SODIUM CHLORIDE 0.9 % IV BOLUS
1000.0000 mL | Freq: Once | INTRAVENOUS | Status: AC
  Administered 2020-09-04: 1000 mL via INTRAVENOUS

## 2020-09-04 MED ORDER — FAMOTIDINE IN NACL 20-0.9 MG/50ML-% IV SOLN
20.0000 mg | Freq: Once | INTRAVENOUS | Status: AC
  Administered 2020-09-04: 20 mg via INTRAVENOUS
  Filled 2020-09-04: qty 50

## 2020-09-04 MED ORDER — EPINEPHRINE 0.3 MG/0.3ML IJ SOAJ
INTRAMUSCULAR | Status: AC
  Filled 2020-09-04: qty 0.3

## 2020-09-04 NOTE — ED Triage Notes (Signed)
Pt BIBA via [facility] c/o of unknown allergic reaction around 9:30pm tonight. Pt received epi pen, 50 mg PO and 50 mg IV benadryl, and 125 mg solumedrol PTA. Pt denies any pain. Pt presents with redness, hives, and swelling on all extremities. Pt denies SOB. Pt does sound hoarse when talking and denies pain just feeling "itchy."

## 2020-09-05 LAB — CBC WITH DIFFERENTIAL/PLATELET
Abs Immature Granulocytes: 0.03 10*3/uL (ref 0.00–0.07)
Basophils Absolute: 0 10*3/uL (ref 0.0–0.1)
Basophils Relative: 0 %
Eosinophils Absolute: 0.1 10*3/uL (ref 0.0–0.5)
Eosinophils Relative: 1 %
HCT: 40.4 % (ref 36.0–46.0)
Hemoglobin: 14.1 g/dL (ref 12.0–15.0)
Immature Granulocytes: 0 %
Lymphocytes Relative: 46 %
Lymphs Abs: 4.1 10*3/uL — ABNORMAL HIGH (ref 0.7–4.0)
MCH: 39.4 pg — ABNORMAL HIGH (ref 26.0–34.0)
MCHC: 34.9 g/dL (ref 30.0–36.0)
MCV: 112.8 fL — ABNORMAL HIGH (ref 80.0–100.0)
Monocytes Absolute: 0.3 10*3/uL (ref 0.1–1.0)
Monocytes Relative: 4 %
Neutro Abs: 4.4 10*3/uL (ref 1.7–7.7)
Neutrophils Relative %: 49 %
Platelets: 158 10*3/uL (ref 150–400)
RBC: 3.58 MIL/uL — ABNORMAL LOW (ref 3.87–5.11)
RDW: 13 % (ref 11.5–15.5)
Smear Review: NORMAL
WBC: 9 10*3/uL (ref 4.0–10.5)
nRBC: 0 % (ref 0.0–0.2)

## 2020-09-05 MED ORDER — DIPHENHYDRAMINE HCL 25 MG PO TABS: 25.0000 mg | ORAL_TABLET | Freq: Four times a day (QID) | ORAL | 0 refills | Status: DC

## 2020-09-05 MED ORDER — RACEPINEPHRINE HCL 2.25 % IN NEBU
0.5000 mL | INHALATION_SOLUTION | Freq: Once | RESPIRATORY_TRACT | Status: AC
  Administered 2020-09-05: 0.5 mL via RESPIRATORY_TRACT
  Filled 2020-09-05: qty 0.5

## 2020-09-05 MED ORDER — POTASSIUM CHLORIDE 20 MEQ PO PACK
40.0000 meq | PACK | Freq: Once | ORAL | Status: AC
  Administered 2020-09-05: 40 meq via ORAL
  Filled 2020-09-05: qty 2

## 2020-09-05 MED ORDER — PREDNISONE 20 MG PO TABS: 40.0000 mg | ORAL_TABLET | Freq: Every day | ORAL | 0 refills | Status: AC

## 2020-09-05 NOTE — ED Provider Notes (Signed)
[facility] Emergency Department Provider Note  ____________________________________________  Time seen: Approximately 3:34 AM  I have reviewed the triage vital signs and the nursing notes.   HISTORY  Chief Complaint Allergic Reaction    HPI Maria [name] is a 50 y.o. female with a past history of GERD hypertension pancreatitis who comes ED complaining of itchy red rash all over her body that started at 9:30 PM today.  Also accompanied by sensation of throat swelling and hoarseness.  She took 50 mg of Benadryl, contacted first responders who gave her another 50 mg of IV Benadryl, 125 mg of IV Solu-Medrol, 1 EpiPen.  Symptoms are persistent.  No new exposures.  No new medicines, no new foods.  Had homemade cheeseburgers for dinner.      Past Medical History:  Diagnosis Date  . Anemia   . Anxiety    h/o  . Depression    h/o  . Family history of adverse reaction to anesthesia    mom-hard time waking up  . GERD (gastroesophageal reflux disease)    tums prn  . Hypertension    WAS PUT ON BP MED BY PCP LAST YEAR (2018) AND BP MED MADE PT SICK SO SHE STOPPED TAKING IT-PCP MONITORS BP NOW  . Pancreatitis      Patient Active Problem List   Diagnosis Date Noted  . Gastritis 08/19/2020  . Palpitations 05/18/2020  . Skin mass 05/18/2020  . History of seasonal allergies 05/18/2020  . Acute upper respiratory infection 12/23/2019  . Fatigue 12/09/2019  . Poison ivy dermatitis 11/13/2019  . Depression, recurrent ([place]) 11/13/2019  . Folate deficiency anemia 07/30/2019  . Vitamin D deficiency 07/30/2019  . Hyperglycemia 07/30/2019  . Preventative health care 07/30/2019  . Bee sting allergy 11/11/2018  . Uterine prolapse 02/28/2018  . Vitamin B12 deficiency 01/17/2018  . Paresthesia 11/13/2017  . Essential hypertension 12/24/2016  . Peripheral neuropathy 12/24/2016     Past Surgical History:  Procedure Laterality Date  . ANTERIOR AND POSTERIOR  REPAIR WITH SACROSPINOUS FIXATION N/A 03/13/2018   Procedure: ANTERIOR REPAIR;  Surgeon: [name], MD;  Location: [facility] ORS;  Service: Gynecology;  Laterality: N/A;  . INDUCED ABORTION     x2  . VAGINAL HYSTERECTOMY N/A 03/13/2018   Procedure: HYSTERECTOMY VAGINAL;  Surgeon: [name], MD;  Location: [facility] ORS;  Service: Gynecology;  Laterality: N/A;     Prior to Admission medications   Medication Sig Start Date End Date Taking? Authorizing Provider  diphenhydrAMINE (BENADRYL) 25 MG tablet Take 1 tablet (25 mg total) by mouth every 6 (six) hours for 3 days. 09/05/20 09/08/20 Yes Carrie Mew, MD  predniSONE (DELTASONE) 20 MG tablet Take 2 tablets (40 mg total) by mouth daily with breakfast for 4 days. 09/05/20 09/09/20 Yes Carrie Mew, MD  EPINEPHrine 0.3 mg/0.3 mL IJ SOAJ injection Inject 0.3 mLs (0.3 mg total) into the muscle as needed for anaphylaxis. 11/11/18   [name], NP  hydrOXYzine (ATARAX/VISTARIL) 10 MG tablet Take 1-2 tablets (10-20 mg total) by mouth 3 (three) times daily as needed for itching. 11/13/19   [name], NP  Phenylephrine HCl (SUDAFED PE SINUS CONGESTION PO) Take 10 mg by mouth at bedtime as needed.    [provider]  Phenylephrine-APAP-guaiFENesin (MUCINEX SINUS-MAX PO) Take by mouth as needed.    [provider]     Allergies Bee venom, Codeine, and Bupropion   Family History  Problem Relation Age of Onset  . Arthritis  Mother   . Arthritis Father   . Alcohol abuse Father   . Hypertension Father   . Heart disease Father   . Aneurysm Father   . Breast cancer Maternal Grandmother   . Breast cancer Paternal Grandmother     Social History Social History   Tobacco Use  . Smoking status: Current Every Day Smoker    Packs/day: 1.00    Years: 20.00    Pack years: 20.00    Types: Cigarettes  . Smokeless tobacco: Never Used  Vaping Use  . Vaping Use: Never used  Substance Use Topics  . Alcohol use:  Yes    Alcohol/week: 2.0 standard drinks    Types: 2 Glasses of wine per week    Comment: two glasses wine/beer every other day  . Drug use: No    Review of Systems  Constitutional:   No fever or chills.  ENT: Positive sore throat. Cardiovascular:   No chest pain or syncope. Respiratory:   No dyspnea or cough. Gastrointestinal:   Negative for abdominal pain, vomiting and diarrhea.  Musculoskeletal:   Negative for focal pain or swelling All other systems reviewed and are negative except as documented above in ROS and HPI.  ____________________________________________   PHYSICAL EXAM:  VITAL SIGNS: ED Triage Vitals  Enc Vitals Group     BP 09/04/20 2324 (!) 159/81     Pulse Rate 09/04/20 2324 73     Resp 09/04/20 2324 17     Temp 09/04/20 2324 97.8 F (36.6 C)     Temp Source 09/04/20 2324 Oral     SpO2 09/04/20 2323 98 %     Weight 09/04/20 2324 120 lb (54.4 kg)     Height 09/04/20 2324 5' 1.5" (1.562 m)     Head Circumference --      Peak Flow --      Pain Score 09/04/20 2328 0     Pain Loc --      Pain Edu? --      Excl. in [place]? --     Vital signs reviewed, nursing assessments reviewed.   Constitutional:   Alert and oriented. Non-toxic appearance. Eyes:   Conjunctivae are normal. EOMI. PERRL. ENT      Head:   Normocephalic and atraumatic.      Nose:   Normal.      Mouth/Throat: No tongue swelling or elevation.  Oropharynx normal..      Neck:   No meningismus. Full ROM. Hematological/Lymphatic/Immunilogical:   No cervical lymphadenopathy. Cardiovascular:   RRR. Symmetric bilateral radial and DP pulses.  No murmurs. Cap refill less than 2 seconds. Respiratory:   Normal respiratory effort without tachypnea/retractions. Breath sounds are clear and equal bilaterally. No wheezes/rales/rhonchi.  No stridor Gastrointestinal:   Soft and nontender. Non distended. There is no CVA tenderness.  No rebound, rigidity, or guarding. Musculoskeletal:   Normal range of motion in  all extremities. No joint effusions.  No lower extremity tenderness.  No edema. Neurologic:   Normal speech and language.  Motor grossly intact. No acute focal neurologic deficits are appreciated.  Skin:    Skin is warm, dry and intact.  Urticarial rash generalized.  No petechiae, purpura, or bullae.  ____________________________________________    LABS (pertinent positives/negatives) (all labs ordered are listed, but only abnormal results are displayed) Labs Reviewed  BASIC METABOLIC PANEL - Abnormal; Notable for the following components:      Result Value   Potassium 2.6 (*)    Glucose, Bld  164 (*)    All other components within normal limits  CBC WITH DIFFERENTIAL/PLATELET - Abnormal; Notable for the following components:   RBC 3.58 (*)    MCV 112.8 (*)    MCH 39.4 (*)    Lymphs Abs 4.1 (*)    All other components within normal limits   ____________________________________________   EKG    ____________________________________________    RADIOLOGY  DG Neck Soft Tissue  Result Date: 09/05/2020 CLINICAL DATA:  Cough, dyspnea, allergic reaction EXAM: NECK SOFT TISSUES - 1+ VIEW COMPARISON:  None. FINDINGS: Two view radiograph of the neck soft tissues demonstrates thickening of the epiglottis and aryepiglottic soft tissues. The hypopharynx, however, is not distended. The subglottic airway is widely patent. The prevertebral soft tissues are not thickened. Degenerative changes are seen within the cervical spine at C5-6. No retained radial foreign body. IMPRESSION: Thickening of the a epiglottis and aryepiglottic folds. The hypopharynx is not distended, however, to indicate significant airway obstruction at this time. These results were called by telephone at the time of interpretation on 09/05/2020 at 12:00 am to provider Nolan Lasser , who verbally acknowledged these results. Electronically Signed   By: Fidela Salisbury MD   On: 09/05/2020 00:00   DG Chest Portable 1  View  Result Date: 09/04/2020 CLINICAL DATA:  Cough, dyspnea EXAM: PORTABLE CHEST 1 VIEW COMPARISON:  None. FINDINGS: Lungs are clear. No pneumothorax or pleural effusion. Cardiac size within normal limits. Pulmonary vascularity is normal. Healed left rib fracture noted. No acute bone abnormality. IMPRESSION: No active disease. Electronically Signed   By: Fidela Salisbury MD   On: 09/04/2020 23:50    ____________________________________________   PROCEDURES Procedures  ____________________________________________    CLINICAL IMPRESSION / ASSESSMENT AND PLAN / ED COURSE  Medications ordered in the ED: Medications  EPINEPHrine (EPI-PEN) 0.3 mg/0.3 mL injection (has no administration in time range)  sodium chloride 0.9 % bolus 1,000 mL (0 mLs Intravenous Stopped 09/05/20 0119)  famotidine (PEPCID) IVPB 20 mg premix (0 mg Intravenous Stopped 09/05/20 0006)  Racepinephrine HCl 2.25 % nebulizer solution 0.5 mL (0.5 mLs Nebulization Given 09/05/20 0007)  potassium chloride (KLOR-CON) packet 40 mEq (40 mEq Oral Given 09/05/20 0145)    Pertinent labs & imaging results that were available during my care of the patient were reviewed by me and considered in my medical decision making (see chart for details).  Maria [name] was evaluated in Emergency Department on 09/05/2020 for the symptoms described in the history of present illness. She was evaluated in the context of the global COVID-19 pandemic, which necessitated consideration that the patient might be at risk for infection with the SARS-CoV-2 virus that causes COVID-19. Institutional protocols and algorithms that pertain to the evaluation of patients at risk for COVID-19 are in a state of rapid change based on information released by regulatory bodies including the CDC and federal and state organizations. These policies and algorithms were followed during the patient's care in the ED.   Patient presents with apparent allergic reaction without identifiable  trigger.  Vital signs are normal, no respiratory distress, nontoxic.  Will give IV Pepcid in addition to medications he is already received.  Chest x-ray and soft tissue neck x-ray viewed and interpreted by me, does show thickening of the epiglottis.  Discussed with radiology.  Patient given a racemic epinephrine nebulizer treatment.  On reassessment at 2:30 AM, patient is feeling better but still having some hoarseness.  We will continue to observe for improvement.    -----------------------------------------  3:56 AM on 09/05/2020 -----------------------------------------  Patient feels much better, throat swelling sensation is gone.  Voice is stronger and clearer.  Vital signs remained stable.  Suitable for discharge home at this point.  We will have her continue Benadryl and prednisone for the next 3 to 4 days.  Offered to refill her EpiPen at home which expired last year, she and her husband report they do not need this refilled because they have ready access to it at his fire station.      ____________________________________________   FINAL CLINICAL IMPRESSION(S) / ED DIAGNOSES    Final diagnoses:  Allergic reaction, initial encounter     ED Discharge Orders         Ordered    predniSONE (DELTASONE) 20 MG tablet  Daily with breakfast        09/05/20 0356    diphenhydrAMINE (BENADRYL) 25 MG tablet  Every 6 hours        09/05/20 0356          Portions of this note were generated with dragon dictation software. Dictation errors may occur despite best attempts at proofreading.   Carrie Mew, MD 09/05/20 [phone]

## 2020-09-05 NOTE — ED Notes (Signed)
Critical potassium of 2.6. Dr. Joni Fears notified at this time.

## 2020-09-27 ENCOUNTER — Encounter: Payer: Commercial Managed Care - PPO | Admitting: Dermatology

## 2020-10-04 ENCOUNTER — Encounter: Payer: Self-pay | Admitting: Dermatology

## 2020-10-04 ENCOUNTER — Ambulatory Visit: Payer: Commercial Managed Care - PPO | Admitting: Dermatology

## 2020-10-04 ENCOUNTER — Telehealth: Payer: Self-pay

## 2020-10-04 ENCOUNTER — Other Ambulatory Visit: Payer: Self-pay

## 2020-10-04 DIAGNOSIS — D492 Neoplasm of unspecified behavior of bone, soft tissue, and skin: Secondary | ICD-10-CM

## 2020-10-04 DIAGNOSIS — L720 Epidermal cyst: Secondary | ICD-10-CM | POA: Diagnosis not present

## 2020-10-04 MED ORDER — MUPIROCIN 2 % EX OINT: 1.0000 "application " | TOPICAL_OINTMENT | Freq: Every day | CUTANEOUS | 0 refills | Status: DC

## 2020-10-04 NOTE — Patient Instructions (Signed)
If you have any questions or concerns for your doctor, please call our main line at [phone] and press option 4 to reach your doctor's medical assistant. If no one answers, please leave a voicemail as directed and we will return your call as soon as possible. Messages left after 4 pm will be answered the following business day.   You may also send us a message via MyChart. We typically respond to MyChart messages within 1-2 business days.  For prescription refills, please ask your pharmacy to contact our office. Our fax number is [phone].  If you have an urgent issue when the clinic is closed that cannot wait until the next business day, you can page your doctor at the number below.    Please note that while we do our best to be available for urgent issues outside of office hours, we are not available 24/7.   If you have an urgent issue and are unable to reach us, you may choose to seek medical care at your doctor's office, retail clinic, urgent care center, or emergency room.  If you have a medical emergency, please immediately call 911 or go to the emergency department.  Pager Numbers  - Dr. Kowalski: [phone]  - Dr. Moye: [phone]  - Dr. Stewart: [phone]  In the event of inclement weather, please call our main line at [phone] for an update on the status of any delays or closures.  Dermatology Medication Tips: Please keep the boxes that topical medications come in in order to help keep track of the instructions about where and how to use these. Pharmacies typically print the medication instructions only on the boxes and not directly on the medication tubes.   If your medication is too expensive, please contact our office at [phone] option 4 or send us a message through MyChart.   We are unable to tell what your co-pay for medications will be in advance as this is different depending on your insurance coverage. However, we may be able to find a  substitute medication at lower cost or fill out paperwork to get insurance to cover a needed medication.   If a prior authorization is required to get your medication covered by your insurance company, please allow us 1-2 business days to complete this process.  Drug prices often vary depending on where the prescription is filled and some pharmacies may offer cheaper prices.  The website www.goodrx.com contains coupons for medications through different pharmacies. The prices here do not account for what the cost may be with help from insurance (it may be cheaper with your insurance), but the website can give you the price if you did not use any insurance.  - You can print the associated coupon and take it with your prescription to the pharmacy.  - You may also stop by our office during regular business hours and pick up a GoodRx coupon card.  - If you need your prescription sent electronically to a different pharmacy, notify our office through [facility] MyChart or by phone at [phone] option 4.     Wound Care Instructions  1. Cleanse wound gently with soap and water once a day then pat dry with clean gauze. Apply a thing coat of Petrolatum (petroleum jelly, "Vaseline") over the wound (unless you have an allergy to this). We recommend that you use a new, sterile tube of Vaseline. Do not pick or remove scabs. Do not remove the yellow or white "healing tissue" from the base of the wound.    2. Cover the wound with fresh, clean, nonstick gauze and secure with paper tape. You may use Band-Aids in place of gauze and tape if the would is small enough, but would recommend trimming much of the tape off as there is often too much. Sometimes Band-Aids can irritate the skin.  3. You should call the office for your biopsy report after 1 week if you have not already been contacted.  4. If you experience any problems, such as abnormal amounts of bleeding, swelling, significant bruising, significant pain,  or evidence of infection, please call the office immediately.  5. FOR ADULT SURGERY PATIENTS: If you need something for pain relief you may take 1 extra strength Tylenol (acetaminophen) AND 2 Ibuprofen (200mg each) together every 4 hours as needed for pain. (do not take these if you are allergic to them or if you have a reason you should not take them.) Typically, you may only need pain medication for 1 to 3 days.     

## 2020-10-04 NOTE — Telephone Encounter (Signed)
Spoke with patient regarding surgery. She is doing fine/hd 

## 2020-10-04 NOTE — Progress Notes (Signed)
   Follow-Up Visit   Subjective  Maria [name] is a 50 y.o. female who presents for the following: Cyst (L brow/lateral canthus upper eyelid, pt presents for excision).  The following portions of the chart were reviewed this encounter and updated as appropriate:   Tobacco  Allergies  Meds  Problems  Med Hx  Surg Hx  Fam Hx     Review of Systems:  No other skin or systemic complaints except as noted in HPI or Assessment and Plan.  Objective  Well appearing patient in no apparent distress; mood and affect are within normal limits.  A focused examination was performed including face. Relevant physical exam findings are noted in the Assessment and Plan.  Objective  L brow/lateral canthus upper eyelid: Cystic pap   Assessment & Plan  Neoplasm of skin L brow/lateral canthus upper eyelid  Skin excision  Lesion length (cm):  2.5 Lesion width (cm):  1.5 Margin per side (cm):  0 Total excision diameter (cm):  2.5 Informed consent: discussed and consent obtained   Timeout: patient name, date of birth, surgical site, and procedure verified   Procedure prep:  Patient was prepped and draped in usual sterile fashion Prep type:  Isopropyl alcohol and povidone-iodine Anesthesia: the lesion was anesthetized in a standard fashion   Local anesthetic: 0.75% bupivicaine. Instrument used comment:  15c Hemostasis achieved with: pressure   Hemostasis achieved with comment:  Electrocautery Outcome: patient tolerated procedure well with no complications   Post-procedure details: sterile dressing applied and wound care instructions given   Dressing type: bandage and pressure dressing (Mupirocin)    Skin repair Complexity:  Complex Final length (cm):  2.7 Reason for type of repair: reduce tension to allow closure, reduce the risk of dehiscence, infection, and necrosis, reduce subcutaneous dead space and avoid a hematoma, allow closure of the large defect, preserve normal anatomy, preserve  normal anatomical and functional relationships and enhance both functionality and cosmetic results   Undermining: area extensively undermined   Undermining comment:  Undermining Defect 1.5 cm Subcutaneous layers (deep stitches):  Suture size:  5-0 Suture type: Vicryl (polyglactin 910)   Subcutaneous suture technique: Inverted Dermal. Fine/surface layer approximation (top stitches):  Suture size:  5-0 Suture type: nylon   Stitches: simple running   Suture removal (days):  7 Hemostasis achieved with: suture and pressure Outcome: patient tolerated procedure well with no complications   Post-procedure details: sterile dressing applied and wound care instructions given   Dressing type: bandage, pressure dressing and bacitracin (Mupirocin)    mupirocin ointment (BACTROBAN) 2 %  Specimen 1 - Surgical pathology Differential Diagnosis: D48.5 Cyst vs other Check Margins: No Cystic pap  Return in about 1 week (around 10/11/2020) for suture removal.   I, Ashok Cordia, CMA, am acting as scribe for Sarina Ser, MD .  Documentation: I have reviewed the above documentation for accuracy and completeness, and I agree with the above.  Sarina Ser, MD

## 2020-10-10 ENCOUNTER — Telehealth: Payer: Self-pay

## 2020-10-10 NOTE — Telephone Encounter (Signed)
LM on VM please return my call  

## 2020-10-10 NOTE — Telephone Encounter (Signed)
Spoke with pt and went over results. She had no concerns.

## 2020-10-10 NOTE — Telephone Encounter (Signed)
-----   Message from Ralene Bathe, MD sent at 10/10/2020  3:52 PM EDT ----- Diagnosis Skin (M), left brow lateral canthus upper eyelid, shave EPIDERMAL INCLUSION CYST  Benign cyst

## 2020-10-11 ENCOUNTER — Ambulatory Visit: Payer: Commercial Managed Care - PPO | Admitting: Dermatology

## 2020-10-13 ENCOUNTER — Other Ambulatory Visit: Payer: Self-pay

## 2020-10-13 ENCOUNTER — Ambulatory Visit (INDEPENDENT_AMBULATORY_CARE_PROVIDER_SITE_OTHER): Payer: Commercial Managed Care - PPO | Admitting: Dermatology

## 2020-10-13 DIAGNOSIS — Z4802 Encounter for removal of sutures: Secondary | ICD-10-CM

## 2020-10-13 DIAGNOSIS — L720 Epidermal cyst: Secondary | ICD-10-CM

## 2020-10-13 NOTE — Progress Notes (Signed)
   Follow-Up Visit   Subjective  Maria [name] is a 50 y.o. female who presents for the following: suture removal (Benign cyst of the L brow lat canthus upper eyelid, patient is here today for suture removal).  The following portions of the chart were reviewed this encounter and updated as appropriate:   Tobacco  Allergies  Meds  Problems  Med Hx  Surg Hx  Fam Hx     Review of Systems:  No other skin or systemic complaints except as noted in HPI or Assessment and Plan.  Objective  Well appearing patient in no apparent distress; mood and affect are within normal limits.  A focused examination was performed including the face. Relevant physical exam findings are noted in the Assessment and Plan.  Objective  L brow lat canthus upper eyelid: Healing excision site  Assessment & Plan  Epidermal inclusion cyst L brow lat canthus upper eyelid  Encounter for Removal of Sutures - Incision site at the L brow lat canthus upper eyelid is clean, dry and intact - Wound cleansed, sutures removed, wound cleansed and steri strips applied.  - Discussed pathology results showing a benign cyst.  - Patient advised to keep steri-strips dry until they fall off. - Scars remodel for a full year. - Once steri-strips fall off, patient can apply over-the-counter silicone scar cream each night to help with scar remodeling if desired. - Patient advised to call with any concerns or if they notice any new or changing lesions.   Return if symptoms worsen or fail to improve.  Luther Redo, CMA, am acting as scribe for Sarina Ser, MD .  Documentation: I have reviewed the above documentation for accuracy and completeness, and I agree with the above.  Sarina Ser, MD

## 2020-10-13 NOTE — Patient Instructions (Signed)

## 2020-10-18 ENCOUNTER — Encounter: Payer: Self-pay | Admitting: Dermatology

## 2020-12-30 ENCOUNTER — Encounter: Payer: Commercial Managed Care - PPO | Admitting: Primary Care

## 2020-12-30 ENCOUNTER — Telehealth: Payer: Self-pay

## 2020-12-30 NOTE — Telephone Encounter (Signed)
Appt cancelled as requested ; sending to [facility] to see if needs any charges.

## 2020-12-30 NOTE — Telephone Encounter (Signed)
[facility] Night - Client Nonclinical Telephone Record AccessNurse Client [place] Night - Client Client Site [place] Physician Alma Friendly - NP Contact Type Call Who Is Calling Patient / Member / Family / Caregiver Caller Name [place] Phone Number [phone] Patient Name Maria [name] Patient DOB 12/31/1970 Call Type Message Only Information Provided Reason for Call Request to [facility] Appointment Initial Comment Needing to cancel appt for today at 0900 due to being called into work. Patient request to speak to RN No Disp. Time Disposition Final User 12/30/2020 6:48:33 AM General Information Provided Yes Silvano Rusk Call Closed By: Silvano Rusk Transaction Date/Time: 12/30/2020 6:46:02 AM (ET)

## 2021-01-11 ENCOUNTER — Ambulatory Visit: Payer: Self-pay | Admitting: Primary Care

## 2021-02-02 ENCOUNTER — Other Ambulatory Visit: Payer: Self-pay

## 2021-02-02 ENCOUNTER — Encounter: Payer: Self-pay | Admitting: Family Medicine

## 2021-02-02 ENCOUNTER — Ambulatory Visit (INDEPENDENT_AMBULATORY_CARE_PROVIDER_SITE_OTHER): Payer: Self-pay | Admitting: Family Medicine

## 2021-02-02 ENCOUNTER — Telehealth: Payer: Self-pay

## 2021-02-02 DIAGNOSIS — R202 Paresthesia of skin: Secondary | ICD-10-CM

## 2021-02-02 DIAGNOSIS — D696 Thrombocytopenia, unspecified: Secondary | ICD-10-CM

## 2021-02-02 MED ORDER — FOLIC ACID 1 MG PO TABS: 1.0000 mg | ORAL_TABLET | Freq: Every day | ORAL | Status: DC

## 2021-02-02 MED ORDER — CYANOCOBALAMIN 2000 MCG PO TABS: 2000.0000 ug | ORAL_TABLET | Freq: Every day | ORAL | Status: DC

## 2021-02-02 NOTE — Telephone Encounter (Signed)
Per chart review tab pt has already been seen by Dr Damita Dunnings this afternoon. Sending note to Gentry Fitz NP as PCP and Dr Damita Dunnings.

## 2021-02-02 NOTE — Patient Instructions (Signed)
I would use the splint as much as you can.  Try sleeping in it.   Start B12 and folate.   If your hand isn't getting better, then let us know and we can send you to ortho/hand clinic.    When possible, call about getting an appointment at the blood clinic re: your platelets.    Take care.  Glad to see you.

## 2021-02-02 NOTE — Telephone Encounter (Signed)
Noted. Will review notes.

## 2021-02-02 NOTE — Telephone Encounter (Signed)
[facility] - Client TELEPHONE ADVICE RECORD Access[facility]urse Patient Name: Maria [name] Gender: Female DOB: Aug 05, 1970 Age: 50 Y 54 M 21 D Return Phone Number: [phone] (Primary) Address: City/ State/ Zip: [place] [address] Client [facility] - Client Client Site [place] - Day Physician Alma Friendly - NP Contact Type Call Who Is Calling Patient / Member / Family / Caregiver Call Type Triage / Clinical Relationship To Patient Self Return Phone Number [phone] (Primary) Chief Complaint NUMBNESS/TINGLING- sudden on one side of the body or face Reason for Call Symptomatic / Request for [place] states that she is losing feeling in her right hand. Additional Comment She added that her hand is slightly swollen. Translation No Nurse Assessment Nurse: Rolin Barry, RN, Levada Dy Date/Time Eilene Ghazi Time): 02/02/2021 12:14:06 PM Confirm and document reason for call. If symptomatic, describe symptoms. ---Caller states that she is losing feeling in her right hand, right hand is swollen. Advised that it has been going on for 2 weeks. Advised that it is not getting better with OTC meds. Caller advised that she has 8/10 pain. Caller advised that her hand feels asleep. No known injury. Does the patient have any new or worsening symptoms? ---Yes Will a triage be completed? ---Yes Related visit to physician within the last 2 weeks? ---No Does the PT have any chronic conditions? (i.e. diabetes, asthma, this includes High risk factors for pregnancy, etc.) ---Yes List chronic conditions. ---HTN Is the patient pregnant or possibly pregnant? (Ask all females between the ages of 68-55) ---No Is this a behavioral health or substance abuse call? ---No Guidelines Guideline Title Affirmed Question Affirmed Notes Nurse Date/Time (Eastern Time) Hand and Wrist Pain [1]  SEVERE pain (e.g., excruciating, unable to use hand at all) AND [2] Deaton, RN, Levada Dy 02/02/2021 12:16:21 PM PLEASE NOTE: All timestamps contained within this report are represented as [place] Standard Time. CONFIDENTIALTY NOTICE: This fax transmission is intended only for the addressee. It contains information that is legally privileged, confidential or otherwise protected from use or disclosure. If you are not the intended recipient, you are strictly prohibited from reviewing, disclosing, copying using or disseminating any of this information or taking any action in reliance on or regarding this information. If you have received this fax in error, please notify us immediately by telephone so that we can arrange for its return to [place]. Phone: [phone], Toll-Free: [phone], Fax: [phone] Page: 2 of 2 Call Id: [id number] Guidelines Guideline Title Affirmed Question Affirmed Notes Nurse Date/Time Eilene Ghazi Time) not improved after 2 hours of pain medicine Disp. Time Eilene Ghazi Time) Disposition Final User 02/02/2021 12:13:11 PM Send to Urgent Queue Windy Canny 02/02/2021 12:18:52 PM See HCP within 4 Hours (or PCP triage) Yes Deaton, RN, Cindee Lame Disagree/Comply Comply Caller Understands Yes PreDisposition Did not know what to do Care Advice Given Per Guideline SEE HCP (OR PCP TRIAGE) WITHIN 4 HOURS: * IF OFFICE WILL BE OPEN: You need to be seen within the next 3 or 4 hours. Call your doctor (or NP/PA) now or as soon as the office opens. * ACETAMINOPHEN - EXTRA STRENGTH TYLENOL: Take 1,000 mg (two 500 mg pills) every 8 hours as needed. Each Extra Strength Tylenol pill has 500 mg of acetaminophen. The most you should take each day is 3,000 mg (6 pills a day). * IBUPROFEN (E.G., MOTRIN, ADVIL): Take 400 mg (two 200 mg pills) by mouth every 6 hours. The most you  should take each day is 1,200 mg (six 200 mg pills), unless your doctor has told you to take more. CALL BACK IF: * You  become worse CARE ADVICE given per Hand and Wrist Pain (Adult) guideline. BRING MEDICINES: Comments User: Saverio Danker, RN Date/Time Eilene Ghazi Time): 02/02/2021 12:19:29 PM Caller advised that she already has an appt scheduled for 2:30 today. Referrals REFERRED TO PCP OFFICE

## 2021-02-02 NOTE — Progress Notes (Signed)
This visit occurred during the SARS-CoV-2 public health emergency.  Safety protocols were in place, including screening questions prior to the visit, additional usage of staff PPE, and extensive cleaning of exam room while observing appropriate contact time as indicated for disinfecting solutions.  R hand sx. Intermittent.  Going on for about 1 month, coming and going, more in the last few weeks.  Affecting sleep.  Pins and needle sensation.  Still has sensation.  R handed.  Sx move up the R forearm.  No L sided sx.  Occ lesser paresthesia in the BLE.    Splinting may have helped a little prev but not used a lot recently.    H/o low platelets and folate.  Not currently on folate or B12.  Prev B12 wnl.    Meds, vitals, and allergies reviewed.   ROS: Per HPI unless specifically indicated in ROS section   Nad Ncat Neck supple no LA Rrr Ctab Abd soft not ttp CN 2-12 wnl B, S/S wnl except as noted below.   Normal vibration sensation Slight dec monofilament on R hand vs L hand and R forearm.   R>L tinel positive.

## 2021-02-05 DIAGNOSIS — D696 Thrombocytopenia, unspecified: Secondary | ICD-10-CM | POA: Insufficient documentation

## 2021-02-05 NOTE — Assessment & Plan Note (Signed)
D/w pt about possible neuropathy in addition on CTS and d/w pt about options.  I would use the splint as much as possible.  Try sleeping in it. Start B12 and folate.   If hand isn't getting better, then she can let us know and we can send her to ortho/hand clinic.

## 2021-02-05 NOTE — Assessment & Plan Note (Signed)
  When possible, she'll call about getting an appointment at hematology re: thrombocytopenia.

## 2021-02-10 ENCOUNTER — Encounter: Payer: Self-pay | Admitting: Family Medicine

## 2021-02-10 ENCOUNTER — Ambulatory Visit (INDEPENDENT_AMBULATORY_CARE_PROVIDER_SITE_OTHER): Payer: Self-pay | Admitting: Family Medicine

## 2021-02-10 ENCOUNTER — Other Ambulatory Visit: Payer: Self-pay

## 2021-02-10 DIAGNOSIS — R202 Paresthesia of skin: Secondary | ICD-10-CM

## 2021-02-10 NOTE — Progress Notes (Signed)
This visit occurred during the SARS-CoV-2 public health emergency.  Safety protocols were in place, including screening questions prior to the visit, additional usage of staff PPE, and extensive cleaning of exam room while observing appropriate contact time as indicated for disinfecting solutions.  She got B12 and folate in the meantime, d/w pt about starting both meds.  Taking MVI.    Her hand sx continue.  She can't wear a brace a work.  Tried brace at night but still with numbness.  Variable sx, tingling.  Waking her up at night.    Meds, vitals, and allergies reviewed.   ROS: Per HPI unless specifically indicated in ROS section   Nad Ncat R tinel still positive, maybe slightly less than prev but still positive on exam. Altered sensation R hand compared to L.   Intact flexion and extension of the fingers and wrist bilaterally.

## 2021-02-10 NOTE — Patient Instructions (Addendum)
I would get back on B12 and folate.   Splint whenever possible.  Let me check on options here at the clinic.  Take care.  Glad to see you.

## 2021-02-12 NOTE — Assessment & Plan Note (Signed)
I would get back on B12 and folate.   Splint whenever possible.  I told her I would check on options here at the clinic.  I do not know if it would be possible for her to get an injection for carpal tunnel symptoms here at [facility].  I will check on this.

## 2021-02-21 NOTE — Progress Notes (Signed)
Maria Santini T. Merit Maybee, MD, [place] at [facility] [place], [address]  Phone: [phone]  FAX: [place] - 50 y.o. female  MRN [id number]  Date of Birth: [birth year]  Date: 02/22/2021  PCP: [name], NP  Referral: [name], NP  Chief Complaint  Patient presents with   Carpal Tunnel    Right    This visit occurred during the SARS-CoV-2 public health emergency.  Safety protocols were in place, including screening questions prior to the visit, additional usage of staff PPE, and extensive cleaning of exam room while observing appropriate contact time as indicated for disinfecting solutions.   Subjective:   Maria [name] is a 50 y.o. very pleasant female patient with Body mass index is 22.04 kg/m. who presents with the following:  She presents with some decreased sensation of the R hand with a question of CTS?  The triggering of the third digit has been present for quite a long time.  This is relative to the median nerve distribution numbness and tingling at the right hand.  She has not lost any strength or grip, but she has at baseline some tingling sensations and slightly decreased at the first through third.  At the trigger finger on the third digit on the right, she does have painful locking up at times where she has to manually release this her self.  3nd digit trigger fingr.   CTS - doing the last 3 weeks.   R 3rd trigger inj R CTS injection  Review of Systems is noted in the HPI, as appropriate   Objective:   BP 134/60   Pulse (!) 50   Temp 97.8 F (36.6 C) (Temporal)   Ht '5\' 2"'$  (1.575 m)   Wt 120 lb 8 oz (54.7 kg)   LMP 02/24/2018 (Approximate) Comment: spotting   SpO2 98%   BMI 22.04 kg/m    R hand Ecchymosis or edema: neg ROM wrist/hand/digits: full  Carpals, MCP's, digits: NT Distal Ulna and Radius: NT Ecchymosis or edema: neg No  instability Cysts/nodules: + 3rd Digit triggering: + 3rd Finkelstein's test: neg Snuffbox tenderness: neg Scaphoid tubercle: NT Resisted supination: NT Full composite fist, no malrotation Grip, all digits: 5/5 str DIPJT: NT PIP JT: NT MCP JT: NT No tenosynovitis Axial load test: neg Phalen's: + Tinel's: + Atrophy: neg  Hand sensation: dec median nerve distribution  Radiology: No results found.  Assessment and Plan:     ICD-10-CM   1. Carpal tunnel syndrome on right  G56.01 triamcinolone acetonide (KENALOG-40) injection 20 mg    2. Acquired trigger finger of right middle finger  M65.331 triamcinolone acetonide (KENALOG-40) injection 20 mg     Classic carpal tunnel syndrome on the right side as well as a longer lasting trigger finger on the right third digit.  We discussed the anatomy involved, and that carpal tunnel syndrome primarily involves the median nerve, and this typically affects digits one through 3.   We also discussed that mild cases of carpal tunnel syndrome are often improved with night splints.  If symptoms persist it is very reasonable to consider a carpal tunnel injection.   If the patient does have moderate to severe carpal tunnel syndrome based on NCV, then it is certainly reasonable to consider surgical consultation for definitive management possible carpal tunnel release.  At this point, the patient like to proceed conservatively.   We discussed the pathophysiology of trigger fingers. Discussed  the inflammatory nature of nodule creation and likely nodule abutting the A1 pulley system, this causing the patient's discomfort and sensations. We discussed that treatments for this include direct injection into the tendon sheath to attempt to shrink catching tissue. This can be done 1-2 times. Other treatments include surgical release. If the patient fails two trigger finger injections, I would recommend trigger finger release if the patient desires relief of the  symptoms.   Carpal Tunnel Injection Procedure Note Maria [name] January 21, 1971 Date of procedure: 02/22/2021  Procedure: Carpal Tunnel Injection, R Indications: Numbness  Procedure Details Verbal consent was obtained. Risks, benefits, and alternatives were discussed. Prepped with Chloraprep and Ethyl Chloride used for anesthesia. Under sterile conditions,  the patient was injected just ulnar to the palmaris longus tendon at the wrist flexion crease.  The needle was inserted at 45 degree angle aiming distally. Aspiration showed no blood. Medication flowed freely without resistance.  Needle size: 22 gauge 1 1/2 inch Injection: 1/2 cc of Lidocaine 1% and Kenalog 20 mg Medication: 1/2 cc of Kenalog 40 mg (equaling Kenalog 20 mg)   Tendon Sheath Injection Procedure Note Maria [name] 1970/12/26 Date of procedure: 02/22/2021  Procedure: Tendon Sheath Injection for Trigger Finger, R 3rd Indications: Pain  Procedure Details Verbal consent was obtained. Risks (including potential risk for skin lightening and potential atrophy), benefits and alternatives were discussed. Prepped with Chloraprep and Ethyl Chloride used for anesthesia. Under sterile conditions, patient injected at palmar crease aiming distally with 45 degree angle towards nodule; injected directly into tendon sheath. Medication flowed freely without resistance.  Needle size: 22 gauge 1 1/2 inch Injection: 1/2 cc of Lidocaine 1% and Kenalog 20 mg Medication: 1/2 cc of Kenalog 40 mg (equaling Kenalog 20 mg)   Meds ordered this encounter  Medications   triamcinolone acetonide (KENALOG-40) injection 20 mg   triamcinolone acetonide (KENALOG-40) injection 20 mg   There are no discontinued medications. No orders of the defined types were placed in this encounter.   Follow-up: f/u if symptoms recur  Dragon [employer] One speech-to-text software was used for transcription in this dictation.  Possible transcriptional errors can occur using  Editor, commissioning.   Signed,  Maud Deed. Maria Osorto, MD   Outpatient Encounter Medications as of 02/22/2021  Medication Sig   cyanocobalamin ([employer] VITAMIN B12) 2000 MCG tablet Take 1 tablet (2,000 mcg total) by mouth daily.   EPINEPHrine 0.3 mg/0.3 mL IJ SOAJ injection Inject 0.3 mLs (0.3 mg total) into the muscle as needed for anaphylaxis.   folic acid (FOLVITE) 1 MG tablet Take 1 tablet (1 mg total) by mouth daily.   hydrOXYzine (ATARAX/VISTARIL) 10 MG tablet Take 1-2 tablets (10-20 mg total) by mouth 3 (three) times daily as needed for itching.   Multiple Vitamin (MULTIVITAMIN) tablet Take 1 tablet by mouth daily.   Phenylephrine-APAP-guaiFENesin (MUCINEX SINUS-MAX PO) Take by mouth as needed.   [EXPIRED] triamcinolone acetonide (KENALOG-40) injection 20 mg    [EXPIRED] triamcinolone acetonide (KENALOG-40) injection 20 mg    No facility-administered encounter medications on file as of 02/22/2021.

## 2021-02-22 ENCOUNTER — Ambulatory Visit (INDEPENDENT_AMBULATORY_CARE_PROVIDER_SITE_OTHER): Payer: Self-pay | Admitting: Family Medicine

## 2021-02-22 ENCOUNTER — Encounter: Payer: Self-pay | Admitting: Family Medicine

## 2021-02-22 ENCOUNTER — Other Ambulatory Visit: Payer: Self-pay

## 2021-02-22 VITALS — BP 134/60 | HR 50 | Temp 97.8°F | Ht 62.0 in | Wt 120.5 lb

## 2021-02-22 DIAGNOSIS — M65331 Trigger finger, right middle finger: Secondary | ICD-10-CM

## 2021-02-22 DIAGNOSIS — G5601 Carpal tunnel syndrome, right upper limb: Secondary | ICD-10-CM

## 2021-02-22 MED ORDER — TRIAMCINOLONE ACETONIDE 40 MG/ML IJ SUSP
20.0000 mg | Freq: Once | INTRAMUSCULAR | Status: AC
  Administered 2021-02-22: 20 mg via INTRA_ARTICULAR

## 2021-05-17 ENCOUNTER — Telehealth: Payer: Self-pay

## 2021-05-17 ENCOUNTER — Encounter: Payer: Self-pay | Admitting: Family Medicine

## 2021-05-17 ENCOUNTER — Ambulatory Visit (INDEPENDENT_AMBULATORY_CARE_PROVIDER_SITE_OTHER): Payer: Self-pay | Admitting: Family Medicine

## 2021-05-17 ENCOUNTER — Other Ambulatory Visit: Payer: Self-pay

## 2021-05-17 VITALS — BP 138/78 | HR 79 | Temp 97.9°F | Ht 62.0 in | Wt 121.2 lb

## 2021-05-17 DIAGNOSIS — G5601 Carpal tunnel syndrome, right upper limb: Secondary | ICD-10-CM

## 2021-05-17 DIAGNOSIS — E538 Deficiency of other specified B group vitamins: Secondary | ICD-10-CM

## 2021-05-17 DIAGNOSIS — G629 Polyneuropathy, unspecified: Secondary | ICD-10-CM

## 2021-05-17 DIAGNOSIS — D7589 Other specified diseases of blood and blood-forming organs: Secondary | ICD-10-CM | POA: Insufficient documentation

## 2021-05-17 DIAGNOSIS — Z23 Encounter for immunization: Secondary | ICD-10-CM

## 2021-05-17 DIAGNOSIS — R2231 Localized swelling, mass and lump, right upper limb: Secondary | ICD-10-CM | POA: Insufficient documentation

## 2021-05-17 NOTE — Telephone Encounter (Addendum)
Wells Guiles RN with access nurse said pt having tingling in rt hand; no known injury and no CP or SOB. Pt had carpal tunnel injections over yr ago. Pt said this feels different. Access nurse disposition to be seen within 4 hrs. First appt pt could take was with Dr Darnell Level today at 3 PM. No covid or flu symptoms and no known exposures. Sending note to Dr Darnell Level and Lattie Haw CMA. When gets access nurse note will add to this phone note.     [facility] Day - Client TELEPHONE ADVICE RECORD [facility] Patient Name: Maria [name] Gender: Female DOB: 04/01/1971 Age: 50 Y 51 M 2 D Return Phone Number: [phone] (Primary) Address[place]  [address] Client [facility] - Client Client Site [place] - Day Physician Alma Friendly - NP Contact Type Call Who Is Calling Patient / Member / Family / Caregiver Call Type Triage / Clinical Relationship To Patient Self Return Phone Number [phone] (Primary) Chief Complaint Numbness Reason for Call Symptomatic / Request for [place] states she has numbness in her right hand for the last few days. Has had injections for carple tunnel. Now having some issue with upper arm feels like its not developed. Translation No Nurse Assessment Nurse: Radford Pax, RN, Eugene Garnet Date/Time Eilene Ghazi Time): 05/17/2021 10:08:21 AM Confirm and document reason for call. If symptomatic, describe symptoms. ---Numbness in right hand for a couple of week. Has found a knot under armpit, about the size of a grape. Does the patient have any new or worsening symptoms? ---Yes Will a triage be completed? ---Yes Related visit to physician within the last 2 weeks? ---No Does the PT have any chronic conditions? (i.e. diabetes, asthma, this includes High risk factors for pregnancy, etc.) ---No Is the patient pregnant or possibly pregnant? (Ask all females  between the ages of 67-55) ---No Is this a behavioral health or substance abuse call? ---No Guidelines Guideline Title Affirmed Question Affirmed Notes Nurse Date/Time (Eastern Time) Neurologic Deficit [1] Tingling (e.g., pins and needles) of the face, arm / hand, or leg / foot on one side of the body AND [2] present now (Exceptions: Chronic or recurrent symptom lasting > 4 weeks; or tingling Turner, RN, Rebekah 05/17/2021 10:09:57 AM PLEASE NOTE: All timestamps contained within this report are represented as [place] Standard Time. CONFIDENTIALTY NOTICE: This fax transmission is intended only for the addressee. It contains information that is legally privileged, confidential or otherwise protected from use or disclosure. If you are not the intended recipient, you are strictly prohibited from reviewing, disclosing, copying using or disseminating any of this information or taking any action in reliance on or regarding this information. If you have received this fax in error, please notify us immediately by telephone so that we can arrange for its return to [place]. Phone: [phone], Toll-Free: [phone], Fax: [phone] Page: 2 of 2 Call Id: [id number] Guidelines Guideline Title Affirmed Question Affirmed Notes Nurse Date/Time Eilene Ghazi Time) from known cause, such as: bumped elbow, carpal tunnel syndrome, pinched nerve, frostbite.) Disp. Time Eilene Ghazi Time) Disposition Final User 05/17/2021 10:18:05 AM See HCP within 4 Hours (or PCP triage) Yes Radford Pax, RN, Eugene Garnet Caller Disagree/Comply Comply Caller Understands Yes PreDisposition Call Doctor Care Advice Given Per Guideline SEE HCP (OR PCP TRIAGE) WITHIN 4 HOURS: * IF OFFICE WILL BE OPEN: You need to be seen within the next 3 or 4 hours. Call your doctor (or NP/PA) now  or as soon as the office opens. CALL BACK IF: * You become worse CARE ADVICE given per Neurologic Deficit (Adult) guideline. Referrals REFERRED TO PCP  OFFICE Warm transfer to [place]

## 2021-05-17 NOTE — Patient Instructions (Addendum)
Flu shot today  Labs today.  If worsening let us know for referral for nerve conduction studies [facility]).  Arm spot feels like lipoma, let us know if enlarging or changing characteristics to consider ultrasound.   Peripheral Neuropathy Peripheral neuropathy is a type of nerve damage. It affects nerves that carry signals between the spinal cord and the arms, legs, and the rest of the body (peripheral nerves). It does not affect nerves in the spinal cord or brain. In peripheral neuropathy, one nerve or a group of nerves may be damaged. Peripheral neuropathy is a broad category that includes many specific nerve disorders, like diabetic neuropathy, hereditary neuropathy, and carpal tunnel syndrome. What are the causes? This condition may be caused by: Diabetes. This is the most common cause of peripheral neuropathy. Nerve injury. Pressure or stress on a nerve that lasts a long time. Lack (deficiency) of B vitamins. This can result from alcoholism, poor diet, or a restricted diet. Infections. Autoimmune diseases, such as rheumatoid arthritis and systemic lupus erythematosus. Nerve diseases that are passed from parent to child (inherited). Some medicines, such as cancer medicines (chemotherapy). Poisonous (toxic) substances, such as lead and mercury. Too little blood flowing to the legs. Kidney disease. Thyroid disease. In some cases, the cause of this condition is not known. What are the signs or symptoms? Symptoms of this condition depend on which of your nerves is damaged. Common symptoms include: Loss of feeling (numbness) in the feet, hands, or both. Tingling in the feet, hands, or both. Burning pain. Very sensitive skin. Weakness. Not being able to move a part of the body (paralysis). Muscle twitching. Clumsiness or poor coordination. Loss of balance. Not being able to control your bladder. Feeling dizzy. Sexual problems. How is this diagnosed? Diagnosing and finding the  cause of peripheral neuropathy can be difficult. Your health care provider will take your medical history and do a physical exam. A neurological exam will also be done. This involves checking things that are affected by your brain, spinal cord, and nerves (nervous system). For example, your health care provider will check your reflexes, how you move, and what you can feel. You may have other tests, such as: Blood tests. Electromyogram (EMG) and nerve conduction tests. These tests check nerve function and how well the nerves are controlling the muscles. Imaging tests, such as CT scans or MRI to rule out other causes of your symptoms. Removing a small piece of nerve to be examined in a lab (nerve biopsy). Removing and examining a small amount of the fluid that surrounds the brain and spinal cord (lumbar puncture). How is this treated? Treatment for this condition may involve: Treating the underlying cause of the neuropathy, such as diabetes, kidney disease, or vitamin deficiencies. Stopping medicines that can cause neuropathy, such as chemotherapy. Medicine to help relieve pain. Medicines may include: Prescription or over-the-counter pain medicine. Antiseizure medicine. Antidepressants. Pain-relieving patches that are applied to painful areas of skin. Surgery to relieve pressure on a nerve or to destroy a nerve that is causing pain. Physical therapy to help improve movement and balance. Devices to help you move around (assistive devices). Follow these instructions at home: Medicines Take over-the-counter and prescription medicines only as told by your health care provider. Do not take any other medicines without first asking your health care provider. Do not drive or use heavy machinery while taking prescription pain medicine. Lifestyle  Do not use any products that contain nicotine or tobacco, such as cigarettes and e-cigarettes. Smoking keeps  blood from reaching damaged nerves. If you need  help quitting, ask your health care provider. Avoid or limit alcohol. Too much alcohol can cause a vitamin B deficiency, and vitamin B is needed for healthy nerves. Eat a healthy diet. This includes: Eating foods that are high in fiber, such as fresh fruits and vegetables, whole grains, and beans. Limiting foods that are high in fat and processed sugars, such as fried or sweet foods. General instructions  If you have diabetes, work closely with your health care provider to keep your blood sugar under control. If you have numbness in your feet: Check every day for signs of injury or infection. Watch for redness, warmth, and swelling. Wear padded socks and comfortable shoes. These help protect your feet. Develop a good support system. Living with peripheral neuropathy can be stressful. Consider talking with a mental health specialist or joining a support group. Use assistive devices and attend physical therapy as told by your health care provider. This may include using a walker or a cane. Keep all follow-up visits as told by your health care provider. This is important. Contact a health care provider if: You have new signs or symptoms of peripheral neuropathy. You are struggling emotionally from dealing with peripheral neuropathy. Your pain is not well-controlled. Get help right away if: You have an injury or infection that is not healing normally. You develop new weakness in an arm or leg. You have fallen or do so frequently. Summary Peripheral neuropathy is when the nerves in the arms, or legs are damaged, resulting in numbness, weakness, or pain. There are many causes of peripheral neuropathy, including diabetes, pinched nerves, vitamin deficiencies, autoimmune disease, and hereditary conditions. Diagnosing and finding the cause of peripheral neuropathy can be difficult. Your health care provider will take your medical history, do a physical exam, and do tests, including blood tests and  nerve function tests. Treatment involves treating the underlying cause of the neuropathy and taking medicines to help control pain. Physical therapy and assistive devices may also help. This information is not intended to replace advice given to you by your health care provider. Make sure you discuss any questions you have with your health care provider. Document Revised: 03/29/2020 Document Reviewed: 03/29/2020 Elsevier Patient Education  2022 [employer].

## 2021-05-17 NOTE — Progress Notes (Addendum)
Patient ID: Maria [name], female    DOB: [birth year], 50 y.o.   MRN: [id number]  This visit was conducted in person.  BP 138/78   Pulse 79   Temp 97.9 F (36.6 C) (Temporal)   Ht 5\' 2"  (1.575 m)   Wt 121 lb 3 oz (55 kg)   LMP 02/24/2018 (Approximate) Comment: spotting   SpO2 98%   BMI 22.17 kg/m    CC: check bump to arm  Subjective:   HPI: Maria [name] is a 50 y.o. female presenting on 05/17/2021 for Tingling (C/o right hand tingling.  Started wks ago.  H/o carpal tunnel.  Also, noticed a knot in upper arm. )   Ongoing neuropathy from lower legs down to feet for about 6 months. Numbness when she wakes up. No burning pain. Worse when she first wakes up in the mornings.  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  H/o low potassium and low b12 as well. Not regular with vitamin replacements.   This morning she noticed knot to R posterior upper arm - unsure how long present. Not painful, itching, no tenderness, no recent bug bites. No bruising.   No diabetes history. Alcohol - drinks regularly, 2-3 beers at a time, 2-3 times a week.  Current 1 ppd smoker     Relevant past medical, surgical, family and social history reviewed and updated as indicated. Interim medical history since our last visit reviewed. Allergies and medications reviewed and updated. Outpatient Medications Prior to Visit  Medication Sig Dispense Refill   cyanocobalamin ([facility] VITAMIN B12) 2000 MCG tablet Take 1 tablet (2,000 mcg total) by mouth daily.     EPINEPHrine 0.3 mg/0.3 mL IJ SOAJ injection Inject 0.3 mLs (0.3 mg total) into the muscle as needed for anaphylaxis. 1 Device 0   folic acid (FOLVITE) 1 MG tablet Take 1 tablet (1 mg total) by mouth daily.     hydrOXYzine (ATARAX/VISTARIL) 10 MG tablet Take 1-2 tablets (10-20 mg total) by mouth 3  (three) times daily as needed for itching. 20 tablet 0   Multiple Vitamin (MULTIVITAMIN) tablet Take 1 tablet by mouth daily.     Phenylephrine-APAP-guaiFENesin (MUCINEX SINUS-MAX PO) Take by mouth as needed.     No facility-administered medications prior to visit.     Per HPI unless specifically indicated in ROS section below Review of Systems  Objective:  BP 138/78   Pulse 79   Temp 97.9 F (36.6 C) (Temporal)   Ht 5\' 2"  (1.575 m)   Wt 121 lb 3 oz (55 kg)   LMP 02/24/2018 (Approximate) Comment: spotting   SpO2 98%   BMI 22.17 kg/m   Wt Readings from Last 3 Encounters:  05/17/21 121 lb 3 oz (55 kg)  02/22/21 120 lb 8 oz (54.7 kg)  02/10/21 120 lb 4.8 oz (54.6 kg)      Physical Exam Vitals and nursing note reviewed.  Constitutional:      Appearance: Normal appearance. She is not ill-appearing.  Eyes:     Extraocular Movements: Extraocular movements intact.     Pupils: Pupils are equal, round, and reactive to light.  Neck:     Thyroid: No thyroid mass or thyromegaly.  Cardiovascular:     Rate and Rhythm: Normal rate and regular rhythm.     Pulses: Normal pulses.  Heart sounds: Normal heart sounds. No murmur heard. Pulmonary:     Effort: Pulmonary effort is normal. No respiratory distress.     Breath sounds: Normal breath sounds. No wheezing, rhonchi or rales.  Musculoskeletal:     Cervical back: Normal range of motion and neck supple.     Right lower leg: No edema.     Left lower leg: No edema.     Comments:  2+ DP bilaterally   Skin:    General: Skin is warm and dry.     Findings: No rash.          Comments: Well circumscribed mobile small lump posterior R upper arm  Neurological:     General: No focal deficit present.     Mental Status: She is alert.     Sensory: Sensory deficit present.     Motor: Motor function is intact. No weakness.     Coordination: Coordination is intact.     Comments:  5/5 strength BLE including foot dorsi/plantar flexors +  tinel at right wrist Neg phalen bilaterally Sensation to monofilament intact bilateral feet Sensation to light touch diminished bilateral toes/soles  Psychiatric:        Mood and Affect: Mood normal.        Behavior: Behavior normal.       Assessment & Plan:  This visit occurred during the SARS-CoV-2 public health emergency.  Safety protocols were in place, including screening questions prior to the visit, additional usage of staff PPE, and extensive cleaning of exam room while observing appropriate contact time as indicated for disinfecting solutions.   Problem List Items Addressed This Visit     Peripheral neuropathy - Primary    Chronic, ongoing over 6+ months.  Previously found to have b12 and folate deficiencies, not regular with replacement. Update labs including vitamins and SPEP.  If worsening to consider nerve conduction studies in [place].  Discussed relation of alcohol and smoking to neuropathy - encourage work towards cutting down/quitting.       Relevant Orders   Vitamin B12   Folate   Vitamin B1   TSH   Comprehensive metabolic panel   CBC with Differential/Platelet   Serum protein electrophoresis with reflex   Right carpal tunnel syndrome    S/p steroid injection to R wrist with benefit 01/2021 by Dr Lorelei Pont.  Ongoing symptoms worse in the morning, can wake her up from sleep.  Recommend regular wrist brace use at night time.  See above re labs.      Vitamin B12 deficiency    Update levels not currently on replacement.       Folate deficiency    Update levels not on currently on replacement.       Macrocytosis    Update vitamin levels including B12, B1, B9 (folate).       Skin lump of arm, right    Overall benign exam. Anticipate lipoma. Update if enlarging or changing.       Other Visit Diagnoses     Need for influenza vaccination       Relevant Orders   Flu Vaccine QUAD 70mo+IM (Fluarix, Fluzone & Alfiuria Quad PF) (Completed)        No  orders of the defined types were placed in this encounter.  Orders Placed This Encounter  Procedures   Flu Vaccine QUAD 28mo+IM (Fluarix, Fluzone & Alfiuria Quad PF)   Vitamin B12   Folate   Vitamin B1   TSH   Comprehensive metabolic  panel   CBC with Differential/Platelet   Serum protein electrophoresis with reflex     Patient insructions: Flu shot today  Labs today.  If worsening let us know for referral for nerve conduction studies [facility]).  Arm spot feels like lipoma, let us know if enlarging or changing characteristics to consider ultrasound.   Follow up plan: Return if symptoms worsen or fail to improve.  [name], MD

## 2021-05-17 NOTE — Assessment & Plan Note (Signed)
Overall benign exam. Anticipate lipoma. Update if enlarging or changing.

## 2021-05-17 NOTE — Telephone Encounter (Signed)
Noted  

## 2021-05-17 NOTE — Assessment & Plan Note (Addendum)
Chronic, ongoing over 6+ months.  Previously found to have b12 and folate deficiencies, not regular with replacement. Update labs including vitamins and SPEP.  If worsening to consider nerve conduction studies in [place].  Discussed relation of alcohol and smoking to neuropathy - encourage work towards cutting down/quitting.

## 2021-05-17 NOTE — Assessment & Plan Note (Signed)
S/p steroid injection to R wrist with benefit 01/2021 by Dr Lorelei Pont.  Ongoing symptoms worse in the morning, can wake her up from sleep.  Recommend regular wrist brace use at night time.  See above re labs.

## 2021-05-17 NOTE — Assessment & Plan Note (Signed)
Update levels not currently on replacement.

## 2021-05-17 NOTE — Assessment & Plan Note (Signed)
Update levels not on currently on replacement.

## 2021-05-17 NOTE — Assessment & Plan Note (Signed)
Update vitamin levels including B12, B1, B9 (folate).

## 2021-05-18 LAB — COMPREHENSIVE METABOLIC PANEL
ALT: 14 U/L (ref 0–35)
AST: 32 U/L (ref 0–37)
Albumin: 3.9 g/dL (ref 3.5–5.2)
Alkaline Phosphatase: 70 U/L (ref 39–117)
BUN: 11 mg/dL (ref 6–23)
CO2: 29 mEq/L (ref 19–32)
Calcium: 8.8 mg/dL (ref 8.4–10.5)
Chloride: 108 mEq/L (ref 96–112)
Creatinine, Ser: 0.67 mg/dL (ref 0.40–1.20)
GFR: 101.73 mL/min (ref >60.00)
Glucose, Bld: 98 mg/dL (ref 70–99)
Potassium: 4 mEq/L (ref 3.5–5.1)
Sodium: 142 mEq/L (ref 135–145)
Total Bilirubin: 0.7 mg/dL (ref 0.2–1.2)
Total Protein: 6.1 g/dL (ref 6.0–8.3)

## 2021-05-18 LAB — CBC WITH DIFFERENTIAL/PLATELET
Basophils Absolute: 0 10*3/uL (ref 0.0–0.1)
Basophils Relative: 0.8 % (ref 0.0–3.0)
Eosinophils Absolute: 0.1 10*3/uL (ref 0.0–0.7)
Eosinophils Relative: 1.7 % (ref 0.0–5.0)
HCT: 38.2 % (ref 36.0–46.0)
Hemoglobin: 13.2 g/dL (ref 12.0–15.0)
Lymphocytes Relative: 41 % (ref 12.0–46.0)
Lymphs Abs: 1.9 10*3/uL (ref 0.7–4.0)
MCHC: 34.5 g/dL (ref 30.0–36.0)
MCV: 108.6 fl — ABNORMAL HIGH (ref 78.0–100.0)
Monocytes Absolute: 0.4 10*3/uL (ref 0.1–1.0)
Monocytes Relative: 7.9 % (ref 3.0–12.0)
Neutro Abs: 2.3 10*3/uL (ref 1.4–7.7)
Neutrophils Relative %: 48.6 % (ref 43.0–77.0)
Platelets: 117 10*3/uL — ABNORMAL LOW (ref 150.0–400.0)
RBC: 3.51 Mil/uL — ABNORMAL LOW (ref 3.87–5.11)
RDW: 13.5 % (ref 11.5–15.5)
WBC: 4.7 10*3/uL (ref 4.0–10.5)

## 2021-05-18 LAB — VITAMIN B12: Vitamin B-12: 648 pg/mL (ref 211–911)

## 2021-05-18 LAB — TSH: TSH: 1.37 u[IU]/mL (ref 0.35–5.50)

## 2021-05-18 LAB — FOLATE: Folate: 4 ng/mL — ABNORMAL LOW (ref >5.9)

## 2021-05-24 LAB — PROTEIN ELECTROPHORESIS, SERUM, WITH REFLEX
Albumin ELP: 3.9 g/dL (ref 3.8–4.8)
Alpha 1: 0.3 g/dL (ref 0.2–0.3)
Alpha 2: 0.5 g/dL (ref 0.5–0.9)
Beta 2: 0.4 g/dL (ref 0.2–0.5)
Beta Globulin: 0.4 g/dL (ref 0.4–0.6)
Gamma Globulin: 0.8 g/dL (ref 0.8–1.7)
Total Protein: 6.3 g/dL (ref 6.1–8.1)

## 2021-05-24 LAB — IFE INTERPRETATION: Immunofix Electr Int: NOT DETECTED

## 2021-05-24 LAB — VITAMIN B1: Vitamin B1 (Thiamine): <6 nmol/L — ABNORMAL LOW (ref 8–30)

## 2021-05-29 ENCOUNTER — Encounter: Payer: Self-pay | Admitting: Family Medicine

## 2021-05-29 ENCOUNTER — Other Ambulatory Visit: Payer: Self-pay | Admitting: Family Medicine

## 2021-05-29 ENCOUNTER — Telehealth: Payer: Self-pay | Admitting: Primary Care

## 2021-05-29 DIAGNOSIS — E519 Thiamine deficiency, unspecified: Secondary | ICD-10-CM | POA: Insufficient documentation

## 2021-05-29 MED ORDER — VITAMIN B-1 50 MG PO TABS: 50.0000 mg | ORAL_TABLET | Freq: Every day | ORAL | Status: DC

## 2021-05-29 NOTE — Telephone Encounter (Signed)
Called patient see lab notes for information.

## 2021-05-29 NOTE — Telephone Encounter (Signed)
Pt returning call . Would like a call back [phone]

## 2021-07-20 ENCOUNTER — Encounter: Payer: Self-pay | Admitting: Family Medicine

## 2021-07-20 ENCOUNTER — Ambulatory Visit (INDEPENDENT_AMBULATORY_CARE_PROVIDER_SITE_OTHER): Payer: BC Managed Care – PPO | Admitting: Family Medicine

## 2021-07-20 ENCOUNTER — Other Ambulatory Visit: Payer: Self-pay

## 2021-07-20 VITALS — BP 122/68 | HR 64 | Temp 98.1°F | Ht 62.0 in | Wt 120.0 lb

## 2021-07-20 DIAGNOSIS — M65331 Trigger finger, right middle finger: Secondary | ICD-10-CM | POA: Diagnosis not present

## 2021-07-20 DIAGNOSIS — G5601 Carpal tunnel syndrome, right upper limb: Secondary | ICD-10-CM | POA: Diagnosis not present

## 2021-07-20 DIAGNOSIS — E538 Deficiency of other specified B group vitamins: Secondary | ICD-10-CM

## 2021-07-20 DIAGNOSIS — M549 Dorsalgia, unspecified: Secondary | ICD-10-CM

## 2021-07-20 DIAGNOSIS — R0981 Nasal congestion: Secondary | ICD-10-CM

## 2021-07-20 MED ORDER — FLUTICASONE PROPIONATE 50 MCG/ACT NA SUSP: 2.0000 | Freq: Every day | NASAL | 1 refills | Status: DC

## 2021-07-20 MED ORDER — CYCLOBENZAPRINE HCL 10 MG PO TABS: 5.0000 mg | ORAL_TABLET | Freq: Three times a day (TID) | ORAL | 0 refills | Status: DC | PRN

## 2021-07-20 NOTE — Progress Notes (Signed)
This visit occurred during the SARS-CoV-2 public health emergency.  Safety protocols were in place, including screening questions prior to the visit, additional usage of staff PPE, and extensive cleaning of exam room while observing appropriate contact time as indicated for disinfecting solutions.  Prev injection helped with CTS.  H/o B12 def with low B1 and elevated MCV, d/w pt.  Tingling is not as severe now in R hand, not resolved but is better.  Has been splinting.  Sensation still intact.  Most bothered by trigger finger, R 3rd finger, at the PIP.  Prev injection helped but sx returned.    Tried tylenol cold and sinus then alka seltzer.  Congestion.  No fevers.  Going on for about 1 week.  No ear pain.  No loss of taste or smell. No recent covid test.  D/w pt about options.  No facial or tooth pain.  Not using flonase recently.    Back pain, after using a big press at work.  Had to work by her self at the time.  Interior to L shoulder blade. Getting some better but sometimes with more pain than others.  Tried icy hot.  He was trying to limit meds.    Prev knot on arm resolved.    She is noted occasional memory lapses without red flag memory events.  Discussed restarting her vitamins, specifically B12 and thiamine to make sure that is not contributing.  She need to follow-up with PCP about her memory and B1 deficiency.  Meds, vitals, and allergies reviewed.   ROS: Per HPI unless specifically indicated in ROS section   Nad Ncat Sinuses not ttp.  TM wnl B Nasal exam stuffy. OP wnl.  MMM Neck supple, no LA Rrr Ctab Abd soft, not ttp.  Back not tender to palpation in the midline but slightly tender palpation inferior left shoulder blade. Grip intact bilaterally.

## 2021-07-20 NOTE — Patient Instructions (Addendum)
Get back on your vitamins and we'll call about seeing the hand surgery clinic.  Keep splinting whenever possible.  Take care.  Glad to see you.  Try flonase and flexeril.

## 2021-07-23 DIAGNOSIS — M549 Dorsalgia, unspecified: Secondary | ICD-10-CM | POA: Insufficient documentation

## 2021-07-23 DIAGNOSIS — R0981 Nasal congestion: Secondary | ICD-10-CM | POA: Insufficient documentation

## 2021-07-23 NOTE — Assessment & Plan Note (Signed)
Reasonable to restart Flonase.  Update [place] as needed.

## 2021-07-23 NOTE — Assessment & Plan Note (Signed)
Discussed vitamin B-12 B1 and folate replacement.  Needs follow-up with PCP to recheck her level later on after she restarts her vitamins and also recheck her memory at that point.  Routed to PCP.

## 2021-07-23 NOTE — Assessment & Plan Note (Signed)
Refer to hand surgery clinic.  Splint whenever possible.

## 2021-07-23 NOTE — Assessment & Plan Note (Signed)
Likely with benign muscle spasm.  Discussed use of Flexeril with sedation caution.  Update [place] as needed.

## 2021-07-24 ENCOUNTER — Telehealth: Payer: Self-pay | Admitting: Primary Care

## 2021-07-24 NOTE — Telephone Encounter (Signed)
error 

## 2021-07-28 ENCOUNTER — Encounter: Payer: Self-pay | Admitting: Primary Care

## 2021-07-28 ENCOUNTER — Telehealth (INDEPENDENT_AMBULATORY_CARE_PROVIDER_SITE_OTHER): Payer: BC Managed Care – PPO | Admitting: Primary Care

## 2021-07-28 ENCOUNTER — Other Ambulatory Visit: Payer: Self-pay

## 2021-07-28 VITALS — Ht 62.0 in | Wt 120.0 lb

## 2021-07-28 DIAGNOSIS — R051 Acute cough: Secondary | ICD-10-CM | POA: Insufficient documentation

## 2021-07-28 MED ORDER — PREDNISONE 20 MG PO TABS
ORAL_TABLET | ORAL | 0 refills | Status: DC
Start: 2021-07-28 — End: 2021-09-06

## 2021-07-28 NOTE — Patient Instructions (Signed)
Nasal Congestion/Ear Pressure/Sinus Pressure: Try using Flonase (fluticasone) nasal spray. Instill 1 spray in each nostril twice daily.   Continue Alka-Seltzer Plus.  Start prednisone 20 mg tablets in a few days if you experience shortness of breath with chest tightness. Take 2 tablets by mouth once daily for 5 days.  It was a pleasure to see you today!

## 2021-07-28 NOTE — Progress Notes (Signed)
Patient ID: Maria [name], female    DOB: 17-May-1971, 51 y.o.   MRN: [id number]  Virtual visit completed through [employer], a video enabled telemedicine application. Due to national recommendations of social distancing due to COVID-19, a virtual visit is felt to be most appropriate for this patient at this time. Reviewed limitations, risks, security and privacy concerns of performing a virtual visit and the availability of in person appointments. I also reviewed that there may be a patient responsible charge related to this service. The patient agreed to proceed.   Patient location: home Provider location: [place] at [facility], office Persons participating in this virtual visit: patient, provider   If any vitals were documented, they were collected by patient at home unless specified below.    Ht 5\' 2"  (1.575 m)    Wt 120 lb (54.4 kg)    LMP 02/24/2018 (Approximate) Comment: spotting    BMI 21.95 kg/m    CC: Cough Subjective:   HPI: Maria [name] is a 51 y.o. female with a history of hypertension, tobacco abuse, peripheral neuropathy presenting on 07/28/2021 for cough.  Symptom onset two days ago with cough, dry throat, sinus pressure. Today she's developed hoarse voice. Her cough is non productive.   She denies shortness of breath, chest tightness, increased sputum production.    She's been taking Mucinex with Alka-Seltzer Plus, some improvement. She does have Flonase, has not been using.   Husband has been sick with same symptoms that started prior to hers. She is concerned that her infection will "go to my chest".       Relevant past medical, surgical, family and social history reviewed and updated as indicated. Interim medical history since our last visit reviewed. Allergies and medications reviewed and updated. Outpatient Medications Prior to Visit  Medication Sig Dispense Refill   cyanocobalamin ([employer] VITAMIN B12) 2000 MCG tablet Take 1 tablet (2,000 mcg total) by mouth  daily.     cyclobenzaprine (FLEXERIL) 10 MG tablet Take 0.5-1 tablets (5-10 mg total) by mouth 3 (three) times daily as needed for muscle spasms (sedation caution). 30 tablet 0   EPINEPHrine 0.3 mg/0.3 mL IJ SOAJ injection Inject 0.3 mLs (0.3 mg total) into the muscle as needed for anaphylaxis. 1 Device 0   fluticasone (FLONASE) 50 MCG/ACT nasal spray Place 2 sprays into both nostrils daily. 16 g 1   folic acid (FOLVITE) 1 MG tablet Take 1 tablet (1 mg total) by mouth daily.     hydrOXYzine (ATARAX/VISTARIL) 10 MG tablet Take 1-2 tablets (10-20 mg total) by mouth 3 (three) times daily as needed for itching. 20 tablet 0   Multiple Vitamin (MULTIVITAMIN) tablet Take 1 tablet by mouth daily.     thiamine (VITAMIN B-1) 50 MG tablet Take 1 tablet (50 mg total) by mouth daily.     No facility-administered medications prior to visit.     Per HPI unless specifically indicated in ROS section below Review of Systems  Constitutional:  Negative for chills, fatigue and fever.  HENT:  Positive for sinus pressure. Negative for congestion.   Respiratory:  Positive for cough. Negative for chest tightness, shortness of breath and wheezing.   Objective:  Ht 5\' 2"  (1.575 m)    Wt 120 lb (54.4 kg)    LMP 02/24/2018 (Approximate) Comment: spotting    BMI 21.95 kg/m   Wt Readings from Last 3 Encounters:  07/28/21 120 lb (54.4 kg)  07/20/21 120 lb (54.4 kg)  05/17/21 121 lb 3 oz (55  kg)       Physical exam: General: Alert and oriented x 3, no distress, does not appear sickly  Pulmonary: Speaks in complete sentences without increased work of breathing, no cough during visit.  Psychiatric: Normal mood, thought content, and behavior.     Results for orders placed or performed in visit on 05/17/21  Vitamin B12  Result Value Ref Range   Vitamin B-12 648 211 - 911 pg/mL  Folate  Result Value Ref Range   Folate 4.0 (L) >5.9 ng/mL  Vitamin B1  Result Value Ref Range   Vitamin B1 (Thiamine) <6 (L) 8 - 30  nmol/L  TSH  Result Value Ref Range   TSH 1.37 0.35 - 5.50 uIU/mL  Comprehensive metabolic panel  Result Value Ref Range   Sodium 142 135 - 145 mEq/L   Potassium 4.0 3.5 - 5.1 mEq/L   Chloride 108 96 - 112 mEq/L   CO2 29 19 - 32 mEq/L   Glucose, Bld 98 70 - 99 mg/dL   BUN 11 6 - 23 mg/dL   Creatinine, Ser 0.67 0.40 - 1.20 mg/dL   Total Bilirubin 0.7 0.2 - 1.2 mg/dL   Alkaline Phosphatase 70 39 - 117 U/L   AST 32 0 - 37 U/L   ALT 14 0 - 35 U/L   Total Protein 6.1 6.0 - 8.3 g/dL   Albumin 3.9 3.5 - 5.2 g/dL   GFR 101.73 >60.00 mL/min   Calcium 8.8 8.4 - 10.5 mg/dL  CBC with Differential/Platelet  Result Value Ref Range   WBC 4.7 4.0 - 10.5 K/uL   RBC 3.51 (L) 3.87 - 5.11 Mil/uL   Hemoglobin 13.2 12.0 - 15.0 g/dL   HCT 38.2 36.0 - 46.0 %   MCV 108.6 (H) 78.0 - 100.0 fl   MCHC 34.5 30.0 - 36.0 g/dL   RDW 13.5 11.5 - 15.5 %   Platelets 117.0 (L) 150.0 - 400.0 K/uL   Neutrophils Relative % 48.6 43.0 - 77.0 %   Lymphocytes Relative 41.0 12.0 - 46.0 %   Monocytes Relative 7.9 3.0 - 12.0 %   Eosinophils Relative 1.7 0.0 - 5.0 %   Basophils Relative 0.8 0.0 - 3.0 %   Neutro Abs 2.3 1.4 - 7.7 K/uL   Lymphs Abs 1.9 0.7 - 4.0 K/uL   Monocytes Absolute 0.4 0.1 - 1.0 K/uL   Eosinophils Absolute 0.1 0.0 - 0.7 K/uL   Basophils Absolute 0.0 0.0 - 0.1 K/uL  Serum protein electrophoresis with reflex  Result Value Ref Range   Total Protein 6.3 6.1 - 8.1 g/dL   Albumin ELP 3.9 3.8 - 4.8 g/dL   Alpha 1 0.3 0.2 - 0.3 g/dL   Alpha 2 0.5 0.5 - 0.9 g/dL   Beta Globulin 0.4 0.4 - 0.6 g/dL   Beta 2 0.4 0.2 - 0.5 g/dL   Gamma Globulin 0.8 0.8 - 1.7 g/dL   Abnormal Protein Band1 NOTE NONE DETECTED g/dL   SPE Interp.    IFE Interpretation  Result Value Ref Range   Immunofix Electr Int NO MONOCLONAL PROTEIN DETECTED    Assessment & Plan:   Problem List Items Addressed This Visit       Other   Acute cough - Primary    Likely viral vs allergy, especially with sinus involvement.  She  appears well today, not sickly. No cough.  Discussed to start Flonase BID, continue Alka-Seltzer Plus. Rx for prednisone 40 mg daily x 5 days sent to pharmacy to use  if needed. Instructions discussed.  Follow up PRN.      Relevant Medications   predniSONE (DELTASONE) 20 MG tablet     Meds ordered this encounter  Medications   predniSONE (DELTASONE) 20 MG tablet    Sig: Take 2 tablets by mouth once daily in the morning for 5 days.    Dispense:  10 tablet    Refill:  0    Order Specific Question:   Supervising Provider    Answer:   BEDSOLE, AMY E [[id number]]   No orders of the defined types were placed in this encounter.   I discussed the assessment and treatment plan with the patient. The patient was provided an opportunity to ask questions and all were answered. The patient agreed with the plan and demonstrated an understanding of the instructions. The patient was advised to call back or seek an in-person evaluation if the symptoms worsen or if the condition fails to improve as anticipated.  Follow up plan:  Nasal Congestion/Ear Pressure/Sinus Pressure: Try using Flonase (fluticasone) nasal spray. Instill 1 spray in each nostril twice daily.   Continue Alka-Seltzer Plus.  Start prednisone 20 mg tablets in a few days if you experience shortness of breath with chest tightness. Take 2 tablets by mouth once daily for 5 days.  It was a pleasure to see you today!    [name], NP

## 2021-07-28 NOTE — Assessment & Plan Note (Signed)
Likely viral vs allergy, especially with sinus involvement.  She appears well today, not sickly. No cough.  Discussed to start Flonase BID, continue Alka-Seltzer Plus. Rx for prednisone 40 mg daily x 5 days sent to pharmacy to use if needed. Instructions discussed.  Follow up PRN.

## 2021-08-11 ENCOUNTER — Other Ambulatory Visit: Payer: Self-pay | Admitting: Family Medicine

## 2021-08-18 DIAGNOSIS — M65331 Trigger finger, right middle finger: Secondary | ICD-10-CM | POA: Diagnosis not present

## 2021-08-29 ENCOUNTER — Telehealth: Payer: Self-pay | Admitting: Primary Care

## 2021-08-29 DIAGNOSIS — M549 Dorsalgia, unspecified: Secondary | ICD-10-CM

## 2021-08-29 MED ORDER — CYCLOBENZAPRINE HCL 10 MG PO TABS: 5.0000 mg | ORAL_TABLET | Freq: Three times a day (TID) | ORAL | 0 refills | Status: DC | PRN

## 2021-08-29 NOTE — Telephone Encounter (Signed)
Called patient she is requesting for her back.

## 2021-08-29 NOTE — Addendum Note (Signed)
Addended by: [name] on: 08/29/2021 02:39 PM   Modules accepted: Orders

## 2021-08-29 NOTE — Telephone Encounter (Signed)
It looks like she may be scheduled for carpal tunnel surgery, is that right?  Is she requesting the cyclobenzaprine for her back as she will be laying for surgery?

## 2021-08-29 NOTE — Telephone Encounter (Signed)
Refills sent to pharmacy. 

## 2021-08-29 NOTE — Telephone Encounter (Signed)
Maria [name] called in and wanted to know if she can get a little more of the cyclobenzaprine (FLEXERIL) 10 MG tablet due to she is set to have surgery.

## 2021-08-30 DIAGNOSIS — M65331 Trigger finger, right middle finger: Secondary | ICD-10-CM | POA: Diagnosis not present

## 2021-09-06 ENCOUNTER — Encounter: Payer: Self-pay | Admitting: Family

## 2021-09-06 ENCOUNTER — Other Ambulatory Visit: Payer: Self-pay

## 2021-09-06 ENCOUNTER — Telehealth (INDEPENDENT_AMBULATORY_CARE_PROVIDER_SITE_OTHER): Payer: Self-pay | Admitting: Family

## 2021-09-06 VITALS — Ht 62.0 in | Wt 117.0 lb

## 2021-09-06 DIAGNOSIS — R1114 Bilious vomiting: Secondary | ICD-10-CM

## 2021-09-06 DIAGNOSIS — A084 Viral intestinal infection, unspecified: Secondary | ICD-10-CM

## 2021-09-06 DIAGNOSIS — R12 Heartburn: Secondary | ICD-10-CM

## 2021-09-06 MED ORDER — OMEPRAZOLE 20 MG PO CPDR: 20.0000 mg | DELAYED_RELEASE_CAPSULE | Freq: Every day | ORAL | 0 refills | Status: DC

## 2021-09-06 MED ORDER — ONDANSETRON HCL 4 MG PO TABS: 4.0000 mg | ORAL_TABLET | Freq: Three times a day (TID) | ORAL | 0 refills | Status: DC | PRN

## 2021-09-06 NOTE — Progress Notes (Signed)
MyChart Video Visit    Virtual Visit via Video Note   This visit type was conducted due to national recommendations for restrictions regarding the COVID-19 Pandemic (e.g. social distancing) in an effort to limit this patient's exposure and mitigate transmission in our community. This patient is at least at moderate risk for complications without adequate follow up. This format is felt to be most appropriate for this patient at this time. Physical exam was limited by quality of the video and audio technology used for the visit. CMA was able to get the patient set up on a video visit.  Patient location: Home. Patient and provider in visit Provider location: Office  I discussed the limitations of evaluation and management by telemedicine and the availability of in person appointments. The patient expressed understanding and agreed to proceed.  Visit Date: 09/06/2021  Today's healthcare provider: [name], FNP     Subjective:    Patient ID: Maria [name], female    DOB: Aug 14, 1970, 51 y.o.   MRN: [id number]  Chief Complaint  Patient presents with   Abdominal Pain    Vomiting, diarrhea, stomach ache after eating lunch at work    Abdominal Pain Associated symptoms include diarrhea (very little), nausea and vomiting. Pertinent negatives include no fever.   Pt here with some concerns.  Last Friday last early from work right after lunch because her stomach was feeling queasy and she had some nausea. The next morning she had decreased appetite with some nausea and unable to keep food down. Woke up this am and vomited, but at this point only throwing up liquid and gatorade to stay hydrated.   Bowel movements, loose movement this am, but outside of this she hasn't had much of a bowel movement other than small amounts every few days. Doesn't feel obstructed, no abdominal pain. Slight heart burn, acid reflux a few days ago but has been throwing up a lot. Took otc pepto with mild relief.    Did have fever/chills first but not since.  Just with fatigue no energy.   Past Medical History:  Diagnosis Date   Anemia    Anxiety    h/o   Depression    h/o   Family history of adverse reaction to anesthesia    mom-hard time waking up   GERD (gastroesophageal reflux disease)    tums prn   Hypertension    WAS PUT ON BP MED BY PCP LAST YEAR (2018) AND BP MED MADE PT SICK SO SHE STOPPED TAKING IT-PCP MONITORS BP NOW   Pancreatitis     Past Surgical History:  Procedure Laterality Date   ANTERIOR AND POSTERIOR REPAIR WITH SACROSPINOUS FIXATION N/A 03/13/2018   Procedure: ANTERIOR REPAIR;  Surgeon: [name], MD;  Location: [facility] ORS;  Service: Gynecology;  Laterality: N/A;   INDUCED ABORTION     x2   VAGINAL HYSTERECTOMY N/A 03/13/2018   Procedure: HYSTERECTOMY VAGINAL;  Surgeon: [name], MD;  Location: [facility] OR[facility];  Service: Gynecology;  Laterality: N/A;    Family History  Problem Relation Age of Onset   Arthritis Mother    Arthritis Father    Alcohol abuse Father    Hypertension Father    Heart disease Father    Aneurysm Father    Breast cancer Maternal Grandmother    Breast cancer Paternal Grandmother     Social History   Socioeconomic History   Marital status: Married    Spouse name: Not on file   Number  of children: Not on file   Years of education: Not on file   Highest education level: Not on file  Occupational History   Not on file  Tobacco Use   Smoking status: Every Day    Packs/day: 1.00    Years: 20.00    Pack years: 20.00    Types: Cigarettes   Smokeless tobacco: Never  Vaping Use   Vaping Use: Never used  Substance and Sexual Activity   Alcohol use: Yes    Alcohol/week: 2.0 standard drinks    Types: 2 Glasses of wine per week    Comment: two glasses wine/beer every other day   Drug use: No   Sexual activity: Yes  Other Topics Concern   Not on file  Social History Narrative   Married.   1 child.    Working at [employer] at  [employer].     Enjoys swimming, camping   Social Determinants of Radio broadcast assistant Strain: Not on file  Food Insecurity: Not on file  Transportation Needs: Not on file  Physical Activity: Not on file  Stress: Not on file  Social Connections: Not on file  Intimate Partner Violence: Not on file    Outpatient Medications Prior to Visit  Medication Sig Dispense Refill   cyanocobalamin ([employer] VITAMIN B12) 2000 MCG tablet Take 1 tablet (2,000 mcg total) by mouth daily.     cyclobenzaprine (FLEXERIL) 10 MG tablet Take 0.5-1 tablets (5-10 mg total) by mouth 3 (three) times daily as needed for muscle spasms (sedation caution). 30 tablet 0   EPINEPHrine 0.3 mg/0.3 mL IJ SOAJ injection Inject 0.3 mLs (0.3 mg total) into the muscle as needed for anaphylaxis. 1 Device 0   fluticasone (FLONASE) 50 MCG/ACT nasal spray Place 2 sprays into both nostrils daily. 16 g 1   folic acid (FOLVITE) 1 MG tablet Take 1 tablet (1 mg total) by mouth daily.     hydrOXYzine (ATARAX/VISTARIL) 10 MG tablet Take 1-2 tablets (10-20 mg total) by mouth 3 (three) times daily as needed for itching. 20 tablet 0   Multiple Vitamin (MULTIVITAMIN) tablet Take 1 tablet by mouth daily.     thiamine (VITAMIN B-1) 50 MG tablet Take 1 tablet (50 mg total) by mouth daily.     predniSONE (DELTASONE) 20 MG tablet Take 2 tablets by mouth once daily in the morning for 5 days. 10 tablet 0   No facility-administered medications prior to visit.    Allergies  Allergen Reactions   Bee Venom Swelling   Codeine Swelling   Bupropion Anxiety and Other (See Comments)    Worsened anxiety    Review of Systems  Constitutional:  Negative for chills and fever.  HENT:  Negative for congestion, ear pain, sinus pressure and sore throat.   Respiratory:  Negative for cough, shortness of breath and wheezing.   Cardiovascular:  Negative for chest pain and palpitations.  Gastrointestinal:  Positive for diarrhea (very little), nausea and vomiting.  Negative for abdominal distention, abdominal pain and anal bleeding.       Heartburn       Objective:    Physical Exam Vitals reviewed.  Constitutional:      General: She is not in acute distress.    Appearance: She is well-developed. She is not ill-appearing, toxic-appearing or diaphoretic.  Pulmonary:     Effort: Pulmonary effort is normal.  Neurological:     General: No focal deficit present.     Mental Status: She is alert.  Psychiatric:        Mood and Affect: Mood normal.        Behavior: Behavior normal.    Ht '5\' 2"'$  (1.575 m)    Wt 117 lb (53.1 kg)    LMP 02/24/2018 (Approximate) Comment: spotting    BMI 21.40 kg/m  Wt Readings from Last 3 Encounters:  09/06/21 117 lb (53.1 kg)  07/28/21 120 lb (54.4 kg)  07/20/21 120 lb (54.4 kg)       Assessment & Plan:   Problem List Items Addressed This Visit       Digestive   Bilious vomiting with nausea - Primary    zofran sent to pharmacy      Relevant Medications   ondansetron (ZOFRAN) 4 MG tablet   Viral gastroenteritis    zofran sent to pharmacy BRAT diet, warm salty broths, warm ginger ale as tolerated Increase oral fluids, stay hydrated best able If no improvement in the next 24-48 hours please reach out         Other   Heartburn    rx sent for omeprazole Pt to start as well      Relevant Medications   omeprazole (PRILOSEC) 20 MG capsule    I have discontinued Carlota Godar's predniSONE. I am also having her start on omeprazole and ondansetron. Additionally, I am having her maintain her EPINEPHrine, hydrOXYzine, folic acid, cyanocobalamin, multivitamin, thiamine, fluticasone, and cyclobenzaprine.  Meds ordered this encounter  Medications   omeprazole (PRILOSEC) 20 MG capsule    Sig: Take 1 capsule (20 mg total) by mouth daily.    Dispense:  30 capsule    Refill:  0    Order Specific Question:   Supervising Provider    Answer:   BEDSOLE, AMY E [[id number]]   ondansetron (ZOFRAN) 4 MG tablet    Sig:  Take 1 tablet (4 mg total) by mouth every 8 (eight) hours as needed for nausea or vomiting.    Dispense:  20 tablet    Refill:  0    Order Specific Question:   Supervising Provider    Answer:   BEDSOLE, AMY E [[id number]]    I discussed the assessment and treatment plan with the patient. The patient was provided an opportunity to ask questions and all were answered. The patient agreed with the plan and demonstrated an understanding of the instructions.   The patient was advised to call back or seek an in-person evaluation if the symptoms worsen or if the condition fails to improve as anticipated.  I provided 18 minutes of face-to-face time during this encounter.   [name], [place] [facility] [place] [phone] (phone) [phone] (fax)  [place]

## 2021-09-06 NOTE — Assessment & Plan Note (Signed)
rx sent for omeprazole ?Pt to start as well ?

## 2021-09-06 NOTE — Assessment & Plan Note (Signed)
zofran sent to pharmacy. 

## 2021-09-06 NOTE — Assessment & Plan Note (Signed)
zofran sent to pharmacy ?BRAT diet, warm salty broths, warm ginger ale as tolerated ?Increase oral fluids, stay hydrated best able ?If no improvement in the next 24-48 hours please reach out  ?

## 2021-09-07 ENCOUNTER — Emergency Department: Payer: BC Managed Care – PPO

## 2021-09-07 ENCOUNTER — Emergency Department
Admission: EM | Admit: 2021-09-07 | Discharge: 2021-09-07 | Disposition: A | Discharge status: Home / Self Care | Payer: BC Managed Care – PPO | Attending: Emergency Medicine | Admitting: Emergency Medicine

## 2021-09-07 ENCOUNTER — Other Ambulatory Visit: Payer: Self-pay

## 2021-09-07 ENCOUNTER — Encounter: Payer: Self-pay | Admitting: Emergency Medicine

## 2021-09-07 DIAGNOSIS — N179 Acute kidney failure, unspecified: Secondary | ICD-10-CM | POA: Insufficient documentation

## 2021-09-07 DIAGNOSIS — R7401 Elevation of levels of liver transaminase levels: Secondary | ICD-10-CM | POA: Insufficient documentation

## 2021-09-07 DIAGNOSIS — E860 Dehydration: Secondary | ICD-10-CM | POA: Insufficient documentation

## 2021-09-07 DIAGNOSIS — K529 Noninfective gastroenteritis and colitis, unspecified: Secondary | ICD-10-CM | POA: Diagnosis not present

## 2021-09-07 DIAGNOSIS — K760 Fatty (change of) liver, not elsewhere classified: Secondary | ICD-10-CM | POA: Insufficient documentation

## 2021-09-07 DIAGNOSIS — Z20822 Contact with and (suspected) exposure to covid-19: Secondary | ICD-10-CM | POA: Insufficient documentation

## 2021-09-07 DIAGNOSIS — I700 Atherosclerosis of aorta: Secondary | ICD-10-CM | POA: Insufficient documentation

## 2021-09-07 DIAGNOSIS — R109 Unspecified abdominal pain: Secondary | ICD-10-CM | POA: Diagnosis not present

## 2021-09-07 DIAGNOSIS — K3189 Other diseases of stomach and duodenum: Secondary | ICD-10-CM | POA: Diagnosis not present

## 2021-09-07 DIAGNOSIS — E876 Hypokalemia: Secondary | ICD-10-CM | POA: Insufficient documentation

## 2021-09-07 LAB — COMPREHENSIVE METABOLIC PANEL
ALT: 25 U/L (ref 0–44)
AST: 42 U/L — ABNORMAL HIGH (ref 15–41)
Albumin: 3.5 g/dL (ref 3.5–5.0)
Alkaline Phosphatase: 211 U/L — ABNORMAL HIGH (ref 38–126)
Anion gap: 17 — ABNORMAL HIGH (ref 5–15)
BUN: 49 mg/dL — ABNORMAL HIGH (ref 6–20)
CO2: 24 mmol/L (ref 22–32)
Calcium: 9.3 mg/dL (ref 8.9–10.3)
Chloride: 93 mmol/L — ABNORMAL LOW (ref 98–111)
Creatinine, Ser: 1.32 mg/dL — ABNORMAL HIGH (ref 0.44–1.00)
GFR, Estimated: 49 mL/min — ABNORMAL LOW (ref >60)
Glucose, Bld: 150 mg/dL — ABNORMAL HIGH (ref 70–99)
Potassium: 3 mmol/L — ABNORMAL LOW (ref 3.5–5.1)
Sodium: 134 mmol/L — ABNORMAL LOW (ref 135–145)
Total Bilirubin: 1.6 mg/dL — ABNORMAL HIGH (ref 0.3–1.2)
Total Protein: 7.8 g/dL (ref 6.5–8.1)

## 2021-09-07 LAB — URINALYSIS, COMPLETE (UACMP) WITH MICROSCOPIC
Bacteria, UA: NONE SEEN
Bilirubin Urine: NEGATIVE
Glucose, UA: NEGATIVE mg/dL
Ketones, ur: 5 mg/dL — AB
Nitrite: NEGATIVE
Protein, ur: NEGATIVE mg/dL
Specific Gravity, Urine: 1.04 — ABNORMAL HIGH (ref 1.005–1.030)
WBC, UA: >50 WBC/hpf — ABNORMAL HIGH (ref 0–5)
pH: 5 (ref 5.0–8.0)

## 2021-09-07 LAB — CBC
HCT: 40.9 % (ref 36.0–46.0)
Hemoglobin: 14.3 g/dL (ref 12.0–15.0)
MCH: 36.3 pg — ABNORMAL HIGH (ref 26.0–34.0)
MCHC: 35 g/dL (ref 30.0–36.0)
MCV: 103.8 fL — ABNORMAL HIGH (ref 80.0–100.0)
Platelets: 126 10*3/uL — ABNORMAL LOW (ref 150–400)
RBC: 3.94 MIL/uL (ref 3.87–5.11)
RDW: 11.5 % (ref 11.5–15.5)
WBC: 13.6 10*3/uL — ABNORMAL HIGH (ref 4.0–10.5)
nRBC: 0 % (ref 0.0–0.2)

## 2021-09-07 LAB — MAGNESIUM: Magnesium: 1.7 mg/dL (ref 1.7–2.4)

## 2021-09-07 LAB — RESP PANEL BY RT-PCR (FLU A&B, COVID) ARPGX2
Influenza A by PCR: NEGATIVE
Influenza B by PCR: NEGATIVE
SARS Coronavirus 2 by RT PCR: NEGATIVE

## 2021-09-07 LAB — LIPASE, BLOOD: Lipase: 19 U/L (ref 11–51)

## 2021-09-07 MED ORDER — POTASSIUM CHLORIDE 10 MEQ/100ML IV SOLN
10.0000 meq | INTRAVENOUS | Status: DC
  Administered 2021-09-07: 11:00:00 10 meq via INTRAVENOUS
  Filled 2021-09-07: qty 100

## 2021-09-07 MED ORDER — FOSFOMYCIN TROMETHAMINE 3 G PO PACK
3.0000 g | PACK | Freq: Once | ORAL | Status: AC
  Administered 2021-09-07: 14:00:00 3 g via ORAL
  Filled 2021-09-07: qty 3

## 2021-09-07 MED ORDER — ACETAMINOPHEN 500 MG PO TABS
1000.0000 mg | ORAL_TABLET | Freq: Once | ORAL | Status: AC
  Administered 2021-09-07: 12:00:00 1000 mg via ORAL
  Filled 2021-09-07: qty 2

## 2021-09-07 MED ORDER — LACTATED RINGERS IV BOLUS
1000.0000 mL | Freq: Once | INTRAVENOUS | Status: AC
  Administered 2021-09-07: 11:00:00 1000 mL via INTRAVENOUS

## 2021-09-07 MED ORDER — FENTANYL CITRATE PF 50 MCG/ML IJ SOSY
50.0000 ug | PREFILLED_SYRINGE | Freq: Once | INTRAMUSCULAR | Status: AC
  Administered 2021-09-07: 09:00:00 50 ug via INTRAVENOUS
  Filled 2021-09-07: qty 1

## 2021-09-07 MED ORDER — LACTATED RINGERS IV BOLUS
1000.0000 mL | Freq: Once | INTRAVENOUS | Status: AC
  Administered 2021-09-07: 09:00:00 1000 mL via INTRAVENOUS

## 2021-09-07 MED ORDER — DICYCLOMINE HCL 10 MG PO CAPS: 10.0000 mg | ORAL_CAPSULE | Freq: Three times a day (TID) | ORAL | 0 refills | Status: DC

## 2021-09-07 MED ORDER — ONDANSETRON HCL 4 MG/2ML IJ SOLN
4.0000 mg | Freq: Once | INTRAMUSCULAR | Status: AC
  Administered 2021-09-07: 09:00:00 4 mg via INTRAVENOUS
  Filled 2021-09-07: qty 2

## 2021-09-07 MED ORDER — DICYCLOMINE HCL 10 MG PO CAPS
10.0000 mg | ORAL_CAPSULE | ORAL | Status: AC
  Administered 2021-09-07: 12:00:00 10 mg via ORAL
  Filled 2021-09-07: qty 1

## 2021-09-07 MED ORDER — IOHEXOL 300 MG/ML  SOLN
100.0000 mL | Freq: Once | INTRAMUSCULAR | Status: AC | PRN
  Administered 2021-09-07: 10:00:00 80 mL via INTRAVENOUS

## 2021-09-07 MED ORDER — KETOROLAC TROMETHAMINE 30 MG/ML IJ SOLN
15.0000 mg | Freq: Once | INTRAMUSCULAR | Status: AC
  Administered 2021-09-07: 12:00:00 15 mg via INTRAVENOUS
  Filled 2021-09-07: qty 1

## 2021-09-07 MED ORDER — ALUM & MAG HYDROXIDE-SIMETH 200-200-20 MG/5ML PO SUSP
30.0000 mL | Freq: Once | ORAL | Status: AC
  Administered 2021-09-07: 12:00:00 30 mL via ORAL
  Filled 2021-09-07: qty 30

## 2021-09-07 NOTE — ED Triage Notes (Signed)
Pt comes into the ED via POV c/o abd pain, emesis, and diarrhea that started 3 days ago.  Pt denies being around anyone knowingly that is sick.  Pt denies any chest pain, SHOB, or dizziness.  Pt in NAD at this time with even and unlabored respirations.  ?

## 2021-09-07 NOTE — ED Notes (Signed)
Urine sample sent to lab

## 2021-09-07 NOTE — ED Notes (Signed)
See triage note  presents with a 3 day hx of n/v/d and abd pain  denies any fever PTA  on arrival to ED pt is cold to touch  skin slightly mottled   Bear hugger applied ?

## 2021-09-07 NOTE — Discharge Instructions (Addendum)
Your CT today showed: ?IMPRESSION: ?1. Circumferential gastric wall thickening compatible with acute ?gastritis. ?2. Hepatic steatosis. ?3. Chronic pancreatitis. ?4. Aortic Atherosclerosis (ICD10-I70.0). ?

## 2021-09-07 NOTE — ED Notes (Signed)
States pain is returning  unable to tolerate po fluids except for sips   Provider aware ?

## 2021-09-07 NOTE — ED Provider Notes (Signed)
? ?[facility] ?Provider Note ? ? ? Event Date/Time  ? First MD Initiated Contact with Patient 09/07/21 [phone]   ?  (approximate) ? ? ?History  ? ?Abdominal Cramping, Emesis, and Diarrhea ? ? ?HPI ? ?Maria [name] is a 51 y.o. female with a past medical history of GERD, depression, anxiety and anemia who presents for evaluation of between 3 days and 5 days of some generalized crampy abdominal pain getting worse associate with nonbloody nonbilious vomiting and diarrhea.  Patient denies any urinary symptoms, back pain, chest pain, cough, shortness of breath, headache, earache, sore throat or any other clear associated sick symptoms.  She endorses tobacco abuse and states she typically drinks about 2 alcoholic beverages per day but has not anything to drink since she got sick and was not drinking excessively before becoming ill.  Denies any illicit drug use.  No recent travel outside [place] or sick contacts. ?  ? ? ?Physical Exam  ?Triage Vital Signs: ?ED Triage Vitals [09/07/21 0853]  ?Enc Vitals Group  ?   BP   ?   Pulse   ?   Resp   ?   Temp   ?   Temp src   ?   SpO2   ?   Weight 117 lb 1 oz (53.1 kg)  ?   Height _0  (1.575 m)  ?   Head Circumference   ?   Peak Flow   ?   Pain Score 8  ?   Pain Loc   ?   Pain Edu?   ?   Excl. in [place]?   ? ? ?Most recent vital signs: ?Vitals:  ? 09/07/21 1100 09/07/21 1130  ?BP: 135/74 125/72  ?Pulse: 84 77  ?Resp: (!) 27 (!) 22  ?Temp:    ?SpO2: 97% 100%  ? ? ?General: Awake, uncomfortable and dehydrated appearing. ?CV:  Good peripheral perfusion. 2+ radial pulses.  Prolonged capillary refill. ?Resp:  Normal effort. ?Abd:  No distention.  Tender throughout.  No CVA tenderness. ?Other:  No significant lower extreme edema.  Dry mucous membranes. ? ? ?ED Results / Procedures / Treatments  ?Labs ?(all labs ordered are listed, but only abnormal results are displayed) ?Labs Reviewed  ?COMPREHENSIVE METABOLIC PANEL - Abnormal; Notable for the following  components:  ?    Result Value  ? Sodium 134 (*)   ? Potassium 3.0 (*)   ? Chloride 93 (*)   ? Glucose, Bld 150 (*)   ? BUN 49 (*)   ? Creatinine, Ser 1.32 (*)   ? AST 42 (*)   ? Alkaline Phosphatase 211 (*)   ? Total Bilirubin 1.6 (*)   ? GFR, Estimated 49 (*)   ? Anion gap 17 (*)   ? All other components within normal limits  ?CBC - Abnormal; Notable for the following components:  ? WBC 13.6 (*)   ? MCV 103.8 (*)   ? MCH 36.3 (*)   ? Platelets 126 (*)   ? All other components within normal limits  ?URINALYSIS, COMPLETE (UACMP) WITH MICROSCOPIC - Abnormal; Notable for the following components:  ? Color, Urine YELLOW (*)   ? APPearance HAZY (*)   ? Specific Gravity, Urine 1.040 (*)   ? Hgb urine dipstick SMALL (*)   ? Ketones, ur 5 (*)   ? Leukocytes,Ua LARGE (*)   ? WBC, UA >50 (*)   ? Non Squamous Epithelial PRESENT (*)   ? All other components  within normal limits  ?RESP PANEL BY RT-PCR (FLU A&B, COVID) ARPGX2  ?GASTROINTESTINAL PANEL BY PCR, STOOL (REPLACES STOOL CULTURE)  ?C DIFFICILE QUICK SCREEN W PCR REFLEX    ?URINE CULTURE  ?LIPASE, BLOOD  ?MAGNESIUM  ? ? ? ?EKG ? ?ECG is remarkable sinus rhythm with a ventricular rate of 63, normal axis, unremarkable intervals without evidence of acute ischemia or significant arrhythmia. ? ? ?RADIOLOGY ? ?My interpretation of the CT of the abdomen and pelvis there are some thickening of the gastric wall and hepatic steatosis as well as evidence of chronic pancreatitis but no evidence of diverticulitis, kidney stone, cholecystitis, acute pancreatitis, appendicitis, abscess or any other clear acute abdominal or pelvic process.  Also viewed radiology interpretation and agree with the findings of same in addition to some aortic atherosclerosis.  No other acute process. ? ? ?PROCEDURES: ? ?Critical Care performed: No ? ?.1-3 Lead EKG Interpretation ?Performed by: [name], MD ?Authorized by: [name], MD  ? ?  Interpretation: normal   ?  ECG rate assessment:  normal   ?  Rhythm: sinus rhythm   ?  Ectopy: none   ?  Conduction: normal   ? ?The patient is on the cardiac monitor to evaluate for evidence of arrhythmia and/or significant heart rate changes. ? ? ?MEDICATIONS ORDERED IN ED: ?Medications  ?potassium chloride 10 mEq in 100 mL IVPB (0 mEq Intravenous Stopped 09/07/21 1140)  ?fosfomycin (MONUROL) packet 3 g (has no administration in time range)  ?lactated ringers bolus 1,000 mL (1,000 mLs Intravenous New Bag/Given 09/07/21 0916)  ?ondansetron [facility]) injection 4 mg (4 mg Intravenous Given 09/07/21 0917)  ?fentaNYL (SUBLIMAZE) injection 50 mcg (50 mcg Intravenous Given 09/07/21 0917)  ?iohexol (OMNIPAQUE) 300 MG/ML solution 100 mL (80 mLs Intravenous Contrast Given 09/07/21 0957)  ?lactated ringers bolus 1,000 mL (0 mLs Intravenous Stopped 09/07/21 1058)  ?ketorolac (TORADOL) 30 MG/ML injection 15 mg (15 mg Intravenous Given 09/07/21 1225)  ?dicyclomine (BENTYL) capsule 10 mg (10 mg Oral Given 09/07/21 1225)  ?acetaminophen (TYLENOL) tablet 1,000 mg (1,000 mg Oral Given 09/07/21 1225)  ?alum & mag hydroxide-simeth (MAALOX/MYLANTA) 200-200-20 MG/5ML suspension 30 mL (30 mLs Oral Given 09/07/21 1225)  ? ? ? ?IMPRESSION / MDM / ASSESSMENT AND PLAN / ED COURSE  ?I reviewed the triage vital signs and the nursing notes. ?             ?               ? ?Differential diagnosis includes, but is not limited to acute infectious gastroenteritis, diverticulitis, appendicitis, cholecystitis, pancreatitis, metabolic derangements including possible kidney injury and possible UTI. ? ?Lipase not consistent with acute pancreatitis.  CMP is remarkable for K of 3, BUN of 49 and creatinine of 1.32 compared to 0.673 months ago as well as a slight elevation of alk phosphatase at 211 and bilirubin at 1.6 without other significant electrolyte or metabolic derangements.  CBC with WBC count of 13.6 without evidence of acute anemia.  Magnesium is 1.7.  COVID influenza PCR is negative.  UA shows large leukocyte  esterase and greater than 50 WBCs.  We will patient denies any other symptoms somewhat difficult to exclude cystitis contributing.  We will give a one-time dose of fosfomycin. ? ?My interpretation of the CT of the abdomen and pelvis there are some thickening of the gastric wall and hepatic steatosis as well as evidence of chronic pancreatitis but no evidence of diverticulitis, kidney stone, cholecystitis, acute pancreatitis,  appendicitis, abscess or any other clear acute abdominal or pelvic process.  Also viewed radiology interpretation and agree with the findings of same in addition to some aortic atherosclerosis.  No other acute process. ? ?Patient is feeling much better on reassessment and able tolerate p.o.  Advise she can take her Zofran.  Flu prescribed and will add some Bentyl.  Discussed returning for any new or worsening of symptoms.  Recommended she follow-up with her PCP to have her kidney function and electrolytes rechecked in 2 to 3 days.  Discussed incidental findings of some fatty liver and aortic atherosclerosis.  Discharged in stable condition.  Strict return precautions advised and discussed. ? ?  ? ? ?FINAL CLINICAL IMPRESSION(S) / ED DIAGNOSES  ? ?Final diagnoses:  ?Gastroenteritis  ?Dehydration  ?Hypokalemia  ?Hepatic steatosis  ?Aortic atherosclerosis ([place])  ?AKI (acute kidney injury) ([place])  ? ? ? ?Rx / DC Orders  ? ?ED Discharge Orders   ? ?      Ordered  ?  dicyclomine (BENTYL) 10 MG capsule  3 times daily before meals & bedtime       ? 09/07/21 1055  ? ?  ?  ? ?  ? ? ? ?Note:  This document was prepared using Dragon voice recognition software and may include unintentional dictation errors. ?  ?[name], MD ?09/07/21 1314 ? ?

## 2021-09-09 LAB — URINE CULTURE: Culture: >=100000 — AB

## 2021-09-13 ENCOUNTER — Other Ambulatory Visit: Payer: Self-pay

## 2021-09-13 ENCOUNTER — Emergency Department: Payer: BC Managed Care – PPO | Admitting: Anesthesiology

## 2021-09-13 ENCOUNTER — Encounter: Payer: Self-pay | Admitting: Primary Care

## 2021-09-13 ENCOUNTER — Telehealth: Payer: Self-pay

## 2021-09-13 ENCOUNTER — Telehealth: Payer: Self-pay | Admitting: *Deleted

## 2021-09-13 ENCOUNTER — Ambulatory Visit (INDEPENDENT_AMBULATORY_CARE_PROVIDER_SITE_OTHER): Payer: BC Managed Care – PPO | Admitting: Primary Care

## 2021-09-13 ENCOUNTER — Inpatient Hospital Stay
Admission: EM | Admit: 2021-09-13 | Discharge: 2021-10-11 | DRG: 326 | Disposition: A | Discharge status: Home Health | Payer: BC Managed Care – PPO | Attending: Surgery | Admitting: Surgery

## 2021-09-13 ENCOUNTER — Emergency Department: Payer: BC Managed Care – PPO

## 2021-09-13 ENCOUNTER — Encounter
Admission: EM | Disposition: A | Discharge status: Home Health | Payer: Self-pay | Source: Home / Self Care | Attending: Surgery

## 2021-09-13 VITALS — BP 96/52 | HR 90 | Ht 62.0 in | Wt 116.6 lb

## 2021-09-13 DIAGNOSIS — F419 Anxiety disorder, unspecified: Secondary | ICD-10-CM | POA: Diagnosis present

## 2021-09-13 DIAGNOSIS — Z931 Gastrostomy status: Secondary | ICD-10-CM | POA: Diagnosis not present

## 2021-09-13 DIAGNOSIS — I700 Atherosclerosis of aorta: Secondary | ICD-10-CM | POA: Diagnosis not present

## 2021-09-13 DIAGNOSIS — A419 Sepsis, unspecified organism: Secondary | ICD-10-CM | POA: Diagnosis not present

## 2021-09-13 DIAGNOSIS — Z0000 Encounter for general adult medical examination without abnormal findings: Secondary | ICD-10-CM

## 2021-09-13 DIAGNOSIS — Z20822 Contact with and (suspected) exposure to covid-19: Secondary | ICD-10-CM | POA: Diagnosis present

## 2021-09-13 DIAGNOSIS — E538 Deficiency of other specified B group vitamins: Secondary | ICD-10-CM | POA: Diagnosis present

## 2021-09-13 DIAGNOSIS — D689 Coagulation defect, unspecified: Secondary | ICD-10-CM | POA: Diagnosis not present

## 2021-09-13 DIAGNOSIS — F32A Depression, unspecified: Secondary | ICD-10-CM | POA: Diagnosis not present

## 2021-09-13 DIAGNOSIS — K255 Chronic or unspecified gastric ulcer with perforation: Principal | ICD-10-CM | POA: Diagnosis present

## 2021-09-13 DIAGNOSIS — F1020 Alcohol dependence, uncomplicated: Secondary | ICD-10-CM | POA: Diagnosis present

## 2021-09-13 DIAGNOSIS — Z681 Body mass index (BMI) 19 or less, adult: Secondary | ICD-10-CM

## 2021-09-13 DIAGNOSIS — K7689 Other specified diseases of liver: Secondary | ICD-10-CM | POA: Diagnosis not present

## 2021-09-13 DIAGNOSIS — K651 Peritoneal abscess: Secondary | ICD-10-CM | POA: Diagnosis present

## 2021-09-13 DIAGNOSIS — Y838 Other surgical procedures as the cause of abnormal reaction of the patient, or of later complication, without mention of misadventure at the time of the procedure: Secondary | ICD-10-CM | POA: Diagnosis not present

## 2021-09-13 DIAGNOSIS — G928 Other toxic encephalopathy: Secondary | ICD-10-CM | POA: Diagnosis not present

## 2021-09-13 DIAGNOSIS — W19XXXA Unspecified fall, initial encounter: Secondary | ICD-10-CM | POA: Diagnosis not present

## 2021-09-13 DIAGNOSIS — D638 Anemia in other chronic diseases classified elsewhere: Secondary | ICD-10-CM | POA: Diagnosis not present

## 2021-09-13 DIAGNOSIS — E870 Hyperosmolality and hypernatremia: Secondary | ICD-10-CM | POA: Diagnosis not present

## 2021-09-13 DIAGNOSIS — E43 Unspecified severe protein-calorie malnutrition: Secondary | ICD-10-CM | POA: Diagnosis not present

## 2021-09-13 DIAGNOSIS — Z4659 Encounter for fitting and adjustment of other gastrointestinal appliance and device: Secondary | ICD-10-CM | POA: Diagnosis not present

## 2021-09-13 DIAGNOSIS — R109 Unspecified abdominal pain: Secondary | ICD-10-CM | POA: Diagnosis not present

## 2021-09-13 DIAGNOSIS — Z1231 Encounter for screening mammogram for malignant neoplasm of breast: Secondary | ICD-10-CM

## 2021-09-13 DIAGNOSIS — R1114 Bilious vomiting: Secondary | ICD-10-CM

## 2021-09-13 DIAGNOSIS — K275 Chronic or unspecified peptic ulcer, site unspecified, with perforation: Secondary | ICD-10-CM | POA: Diagnosis present

## 2021-09-13 DIAGNOSIS — Z452 Encounter for adjustment and management of vascular access device: Secondary | ICD-10-CM | POA: Diagnosis not present

## 2021-09-13 DIAGNOSIS — K279 Peptic ulcer, site unspecified, unspecified as acute or chronic, without hemorrhage or perforation: Secondary | ICD-10-CM | POA: Diagnosis not present

## 2021-09-13 DIAGNOSIS — K219 Gastro-esophageal reflux disease without esophagitis: Secondary | ICD-10-CM | POA: Diagnosis present

## 2021-09-13 DIAGNOSIS — D649 Anemia, unspecified: Secondary | ICD-10-CM | POA: Diagnosis not present

## 2021-09-13 DIAGNOSIS — R1013 Epigastric pain: Secondary | ICD-10-CM | POA: Diagnosis present

## 2021-09-13 DIAGNOSIS — E440 Moderate protein-calorie malnutrition: Secondary | ICD-10-CM | POA: Diagnosis not present

## 2021-09-13 DIAGNOSIS — T508X5A Adverse effect of diagnostic agents, initial encounter: Secondary | ICD-10-CM | POA: Diagnosis not present

## 2021-09-13 DIAGNOSIS — I10 Essential (primary) hypertension: Secondary | ICD-10-CM

## 2021-09-13 DIAGNOSIS — D6959 Other secondary thrombocytopenia: Secondary | ICD-10-CM | POA: Diagnosis present

## 2021-09-13 DIAGNOSIS — K7460 Unspecified cirrhosis of liver: Secondary | ICD-10-CM | POA: Diagnosis present

## 2021-09-13 DIAGNOSIS — T411X5A Adverse effect of intravenous anesthetics, initial encounter: Secondary | ICD-10-CM | POA: Diagnosis not present

## 2021-09-13 DIAGNOSIS — E871 Hypo-osmolality and hyponatremia: Secondary | ICD-10-CM | POA: Diagnosis present

## 2021-09-13 DIAGNOSIS — Z0001 Encounter for general adult medical examination with abnormal findings: Secondary | ICD-10-CM

## 2021-09-13 DIAGNOSIS — J9601 Acute respiratory failure with hypoxia: Secondary | ICD-10-CM | POA: Diagnosis not present

## 2021-09-13 DIAGNOSIS — D62 Acute posthemorrhagic anemia: Secondary | ICD-10-CM | POA: Diagnosis not present

## 2021-09-13 DIAGNOSIS — R1012 Left upper quadrant pain: Secondary | ICD-10-CM | POA: Diagnosis not present

## 2021-09-13 DIAGNOSIS — E860 Dehydration: Secondary | ICD-10-CM | POA: Diagnosis not present

## 2021-09-13 DIAGNOSIS — F1010 Alcohol abuse, uncomplicated: Secondary | ICD-10-CM | POA: Diagnosis present

## 2021-09-13 DIAGNOSIS — R000 Tachycardia, unspecified: Secondary | ICD-10-CM | POA: Diagnosis not present

## 2021-09-13 DIAGNOSIS — I214 Non-ST elevation (NSTEMI) myocardial infarction: Secondary | ICD-10-CM | POA: Diagnosis not present

## 2021-09-13 DIAGNOSIS — T8132XA Disruption of internal operation (surgical) wound, not elsewhere classified, initial encounter: Secondary | ICD-10-CM | POA: Diagnosis not present

## 2021-09-13 DIAGNOSIS — K5730 Diverticulosis of large intestine without perforation or abscess without bleeding: Secondary | ICD-10-CM | POA: Diagnosis not present

## 2021-09-13 DIAGNOSIS — F17200 Nicotine dependence, unspecified, uncomplicated: Secondary | ICD-10-CM | POA: Diagnosis not present

## 2021-09-13 DIAGNOSIS — F17210 Nicotine dependence, cigarettes, uncomplicated: Secondary | ICD-10-CM | POA: Diagnosis present

## 2021-09-13 DIAGNOSIS — K861 Other chronic pancreatitis: Secondary | ICD-10-CM

## 2021-09-13 DIAGNOSIS — Z8249 Family history of ischemic heart disease and other diseases of the circulatory system: Secondary | ICD-10-CM

## 2021-09-13 DIAGNOSIS — Z0389 Encounter for observation for other suspected diseases and conditions ruled out: Secondary | ICD-10-CM | POA: Diagnosis not present

## 2021-09-13 DIAGNOSIS — R6520 Severe sepsis without septic shock: Secondary | ICD-10-CM | POA: Diagnosis not present

## 2021-09-13 DIAGNOSIS — N179 Acute kidney failure, unspecified: Secondary | ICD-10-CM | POA: Diagnosis not present

## 2021-09-13 DIAGNOSIS — K860 Alcohol-induced chronic pancreatitis: Secondary | ICD-10-CM | POA: Diagnosis present

## 2021-09-13 DIAGNOSIS — E559 Vitamin D deficiency, unspecified: Secondary | ICD-10-CM | POA: Diagnosis present

## 2021-09-13 DIAGNOSIS — K567 Ileus, unspecified: Secondary | ICD-10-CM | POA: Diagnosis not present

## 2021-09-13 DIAGNOSIS — F1090 Alcohol use, unspecified, uncomplicated: Secondary | ICD-10-CM | POA: Diagnosis present

## 2021-09-13 DIAGNOSIS — D500 Iron deficiency anemia secondary to blood loss (chronic): Secondary | ICD-10-CM | POA: Diagnosis not present

## 2021-09-13 DIAGNOSIS — K3189 Other diseases of stomach and duodenum: Secondary | ICD-10-CM | POA: Diagnosis not present

## 2021-09-13 DIAGNOSIS — K8689 Other specified diseases of pancreas: Secondary | ICD-10-CM | POA: Diagnosis not present

## 2021-09-13 DIAGNOSIS — K251 Acute gastric ulcer with perforation: Secondary | ICD-10-CM | POA: Diagnosis not present

## 2021-09-13 DIAGNOSIS — K668 Other specified disorders of peritoneum: Secondary | ICD-10-CM | POA: Diagnosis not present

## 2021-09-13 DIAGNOSIS — Z4682 Encounter for fitting and adjustment of non-vascular catheter: Secondary | ICD-10-CM | POA: Diagnosis not present

## 2021-09-13 DIAGNOSIS — R188 Other ascites: Secondary | ICD-10-CM | POA: Diagnosis not present

## 2021-09-13 DIAGNOSIS — S2242XA Multiple fractures of ribs, left side, initial encounter for closed fracture: Secondary | ICD-10-CM | POA: Diagnosis not present

## 2021-09-13 DIAGNOSIS — Z885 Allergy status to narcotic agent status: Secondary | ICD-10-CM

## 2021-09-13 DIAGNOSIS — Z888 Allergy status to other drugs, medicaments and biological substances status: Secondary | ICD-10-CM

## 2021-09-13 DIAGNOSIS — R4182 Altered mental status, unspecified: Secondary | ICD-10-CM | POA: Diagnosis not present

## 2021-09-13 DIAGNOSIS — K828 Other specified diseases of gallbladder: Secondary | ICD-10-CM | POA: Diagnosis not present

## 2021-09-13 DIAGNOSIS — Z90710 Acquired absence of both cervix and uterus: Secondary | ICD-10-CM

## 2021-09-13 DIAGNOSIS — Z91030 Bee allergy status: Secondary | ICD-10-CM

## 2021-09-13 DIAGNOSIS — Z6821 Body mass index (BMI) 21.0-21.9, adult: Secondary | ICD-10-CM

## 2021-09-13 DIAGNOSIS — K5720 Diverticulitis of large intestine with perforation and abscess without bleeding: Secondary | ICD-10-CM | POA: Diagnosis present

## 2021-09-13 DIAGNOSIS — Z79899 Other long term (current) drug therapy: Secondary | ICD-10-CM

## 2021-09-13 DIAGNOSIS — I708 Atherosclerosis of other arteries: Secondary | ICD-10-CM | POA: Diagnosis not present

## 2021-09-13 DIAGNOSIS — I639 Cerebral infarction, unspecified: Secondary | ICD-10-CM | POA: Diagnosis not present

## 2021-09-13 DIAGNOSIS — L299 Pruritus, unspecified: Secondary | ICD-10-CM | POA: Diagnosis not present

## 2021-09-13 DIAGNOSIS — T8859XA Other complications of anesthesia, initial encounter: Secondary | ICD-10-CM | POA: Diagnosis present

## 2021-09-13 DIAGNOSIS — N1411 Contrast-induced nephropathy: Secondary | ICD-10-CM | POA: Diagnosis not present

## 2021-09-13 DIAGNOSIS — I2510 Atherosclerotic heart disease of native coronary artery without angina pectoris: Secondary | ICD-10-CM | POA: Diagnosis not present

## 2021-09-13 HISTORY — DX: Other complications of anesthesia, initial encounter: T88.59XA

## 2021-09-13 LAB — COMPREHENSIVE METABOLIC PANEL
ALT: 14 U/L (ref 0–35)
ALT: 15 U/L (ref 0–44)
AST: 26 U/L (ref 0–37)
AST: 29 U/L (ref 15–41)
Albumin: 2.5 g/dL — ABNORMAL LOW (ref 3.5–5.2)
Albumin: 2.2 g/dL — ABNORMAL LOW (ref 3.5–5.0)
Alkaline Phosphatase: 320 U/L — ABNORMAL HIGH (ref 39–117)
Alkaline Phosphatase: 275 U/L — ABNORMAL HIGH (ref 38–126)
Anion gap: 13 (ref 5–15)
BUN: 87 mg/dL (ref 6–23)
BUN: 90 mg/dL — ABNORMAL HIGH (ref 6–20)
CO2: 29 mEq/L (ref 19–32)
CO2: 27 mmol/L (ref 22–32)
Calcium: 9 mg/dL (ref 8.4–10.5)
Calcium: 9.3 mg/dL (ref 8.9–10.3)
Chloride: 87 mEq/L — ABNORMAL LOW (ref 96–112)
Chloride: 87 mmol/L — ABNORMAL LOW (ref 98–111)
Creatinine, Ser: 2.99 mg/dL — ABNORMAL HIGH (ref 0.40–1.20)
Creatinine, Ser: 3.22 mg/dL — ABNORMAL HIGH (ref 0.44–1.00)
GFR, Estimated: 17 mL/min — ABNORMAL LOW (ref >60)
GFR: 17.59 mL/min — ABNORMAL LOW (ref >60.00)
Glucose, Bld: 89 mg/dL (ref 70–99)
Glucose, Bld: 97 mg/dL (ref 70–99)
Potassium: 4 mEq/L (ref 3.5–5.1)
Potassium: 3.6 mmol/L (ref 3.5–5.1)
Sodium: 128 mEq/L — ABNORMAL LOW (ref 135–145)
Sodium: 127 mmol/L — ABNORMAL LOW (ref 135–145)
Total Bilirubin: 3.5 mg/dL — ABNORMAL HIGH (ref 0.2–1.2)
Total Bilirubin: 3.7 mg/dL — ABNORMAL HIGH (ref 0.3–1.2)
Total Protein: 5.2 g/dL — ABNORMAL LOW (ref 6.0–8.3)
Total Protein: 6.6 g/dL (ref 6.5–8.1)

## 2021-09-13 LAB — LIPASE: Lipase: 30 U/L (ref 11.0–59.0)

## 2021-09-13 LAB — LIPID PANEL
Cholesterol: 64 mg/dL (ref 0–200)
HDL: 3.2 mg/dL — ABNORMAL LOW (ref >39.00)
LDL Cholesterol: 41 mg/dL (ref 0–99)
NonHDL: 60.82
Total CHOL/HDL Ratio: 20
Triglycerides: 97 mg/dL (ref 0.0–149.0)
VLDL: 19.4 mg/dL (ref 0.0–40.0)

## 2021-09-13 LAB — CBC WITH DIFFERENTIAL/PLATELET
Abs Immature Granulocytes: 0.37 10*3/uL — ABNORMAL HIGH (ref 0.00–0.07)
Basophils Absolute: 0.2 10*3/uL — ABNORMAL HIGH (ref 0.0–0.1)
Basophils Relative: 1 %
Eosinophils Absolute: 0 10*3/uL (ref 0.0–0.5)
Eosinophils Relative: 0 %
HCT: 32.1 % — ABNORMAL LOW (ref 36.0–46.0)
Hemoglobin: 11.4 g/dL — ABNORMAL LOW (ref 12.0–15.0)
Immature Granulocytes: 2 %
Lymphocytes Relative: 6 %
Lymphs Abs: 1.2 10*3/uL (ref 0.7–4.0)
MCH: 35.5 pg — ABNORMAL HIGH (ref 26.0–34.0)
MCHC: 35.5 g/dL (ref 30.0–36.0)
MCV: 100 fL (ref 80.0–100.0)
Monocytes Absolute: 1 10*3/uL (ref 0.1–1.0)
Monocytes Relative: 5 %
Neutro Abs: 17.4 10*3/uL — ABNORMAL HIGH (ref 1.7–7.7)
Neutrophils Relative %: 86 %
Platelets: 147 10*3/uL — ABNORMAL LOW (ref 150–400)
RBC: 3.21 MIL/uL — ABNORMAL LOW (ref 3.87–5.11)
RDW: 11.6 % (ref 11.5–15.5)
Smear Review: NORMAL
WBC: 20.2 10*3/uL — ABNORMAL HIGH (ref 4.0–10.5)
nRBC: 0 % (ref 0.0–0.2)

## 2021-09-13 LAB — HEMOGLOBIN A1C: Hgb A1c MFr Bld: 5.6 % (ref 4.6–6.5)

## 2021-09-13 LAB — RESP PANEL BY RT-PCR (FLU A&B, COVID) ARPGX2
Influenza A by PCR: NEGATIVE
Influenza B by PCR: NEGATIVE
SARS Coronavirus 2 by RT PCR: NEGATIVE

## 2021-09-13 LAB — LIPASE, BLOOD: Lipase: 33 U/L (ref 11–51)

## 2021-09-13 SURGERY — LAPAROTOMY, EXPLORATORY
Anesthesia: General | Site: Abdomen

## 2021-09-13 MED ORDER — FENTANYL CITRATE PF 50 MCG/ML IJ SOSY
50.0000 ug | PREFILLED_SYRINGE | Freq: Once | INTRAMUSCULAR | Status: AC
Start: 2021-09-13 — End: 2021-09-13
  Administered 2021-09-13: 50 ug via INTRAVENOUS
  Filled 2021-09-13: qty 1

## 2021-09-13 MED ORDER — MIDAZOLAM HCL 2 MG/2ML IJ SOLN
INTRAMUSCULAR | Status: DC | PRN
  Administered 2021-09-13: 2 mg via INTRAVENOUS

## 2021-09-13 MED ORDER — ONDANSETRON HCL 4 MG/2ML IJ SOLN
4.0000 mg | Freq: Once | INTRAMUSCULAR | Status: AC
Start: 2021-09-13 — End: 2021-09-13
  Administered 2021-09-13: 4 mg via INTRAVENOUS
  Filled 2021-09-13: qty 2

## 2021-09-13 MED ORDER — FENTANYL CITRATE (PF) 100 MCG/2ML IJ SOLN
INTRAMUSCULAR | Status: AC
  Filled 2021-09-13: qty 2

## 2021-09-13 MED ORDER — NEOSTIGMINE METHYLSULFATE 10 MG/10ML IV SOLN
INTRAVENOUS | Status: DC | PRN
  Administered 2021-09-13: 3 mg via INTRAVENOUS

## 2021-09-13 MED ORDER — ACETAMINOPHEN 10 MG/ML IV SOLN
INTRAVENOUS | Status: DC | PRN
  Administered 2021-09-13: 1000 mg via INTRAVENOUS

## 2021-09-13 MED ORDER — ACETAMINOPHEN 10 MG/ML IV SOLN
INTRAVENOUS | Status: AC
  Filled 2021-09-13: qty 100

## 2021-09-13 MED ORDER — SODIUM CHLORIDE 0.9 % IV SOLN: Freq: Once | INTRAVENOUS | Status: DC

## 2021-09-13 MED ORDER — ROCURONIUM BROMIDE 100 MG/10ML IV SOLN
INTRAVENOUS | Status: DC | PRN
  Administered 2021-09-13: 10 mg via INTRAVENOUS
  Administered 2021-09-13: 20 mg via INTRAVENOUS

## 2021-09-13 MED ORDER — LACTATED RINGERS IV SOLN: INTRAVENOUS | Status: DC

## 2021-09-13 MED ORDER — LIDOCAINE HCL (CARDIAC) PF 100 MG/5ML IV SOSY
PREFILLED_SYRINGE | INTRAVENOUS | Status: DC | PRN
Start: 2021-09-13 — End: 2021-09-14
  Administered 2021-09-13: 40 mg via INTRAVENOUS

## 2021-09-13 MED ORDER — DEXAMETHASONE SODIUM PHOSPHATE 10 MG/ML IJ SOLN
INTRAMUSCULAR | Status: DC | PRN
  Administered 2021-09-13: 10 mg via INTRAVENOUS

## 2021-09-13 MED ORDER — SUCCINYLCHOLINE CHLORIDE 200 MG/10ML IV SOSY
PREFILLED_SYRINGE | INTRAVENOUS | Status: DC | PRN
  Administered 2021-09-13: 60 mg via INTRAVENOUS

## 2021-09-13 MED ORDER — PROPOFOL 10 MG/ML IV BOLUS
INTRAVENOUS | Status: DC | PRN
  Administered 2021-09-13: 20 mg via INTRAVENOUS
  Administered 2021-09-13: 100 mg via INTRAVENOUS

## 2021-09-13 MED ORDER — SODIUM CHLORIDE (PF) 0.9 % IJ SOLN
INTRAMUSCULAR | Status: DC | PRN
  Administered 2021-09-13: 70 mL via INTRAMUSCULAR

## 2021-09-13 MED ORDER — SODIUM CHLORIDE 0.9 % IV BOLUS
1000.0000 mL | Freq: Once | INTRAVENOUS | Status: AC
  Administered 2021-09-13: 1000 mL via INTRAVENOUS

## 2021-09-13 MED ORDER — PIPERACILLIN-TAZOBACTAM 3.375 G IVPB 30 MIN
3.3750 g | Freq: Once | INTRAVENOUS | Status: AC
  Administered 2021-09-13: 3.375 g via INTRAVENOUS
  Filled 2021-09-13: qty 50

## 2021-09-13 MED ORDER — ACETAMINOPHEN 10 MG/ML IV SOLN: 1000.0000 mg | Freq: Once | INTRAVENOUS | Status: DC | PRN

## 2021-09-13 MED ORDER — SODIUM CHLORIDE FLUSH 0.9 % IV SOLN
INTRAVENOUS | Status: AC
  Filled 2021-09-13: qty 30

## 2021-09-13 MED ORDER — ONDANSETRON HCL 4 MG/2ML IJ SOLN
INTRAMUSCULAR | Status: DC | PRN
  Administered 2021-09-13: 4 mg via INTRAVENOUS

## 2021-09-13 MED ORDER — BUPIVACAINE LIPOSOME 1.3 % IJ SUSP
INTRAMUSCULAR | Status: AC
  Filled 2021-09-13: qty 20

## 2021-09-13 MED ORDER — SODIUM CHLORIDE 0.9 % IV SOLN: INTRAVENOUS | Status: DC | PRN

## 2021-09-13 MED ORDER — PHENYLEPHRINE 40 MCG/ML (10ML) SYRINGE FOR IV PUSH (FOR BLOOD PRESSURE SUPPORT)
PREFILLED_SYRINGE | INTRAVENOUS | Status: DC | PRN
  Administered 2021-09-13 (×4): 100 ug via INTRAVENOUS

## 2021-09-13 MED ORDER — FENTANYL CITRATE (PF) 100 MCG/2ML IJ SOLN: 25.0000 ug | INTRAMUSCULAR | Status: DC | PRN

## 2021-09-13 MED ORDER — PROPOFOL 500 MG/50ML IV EMUL
INTRAVENOUS | Status: AC
  Filled 2021-09-13: qty 50

## 2021-09-13 MED ORDER — GLYCOPYRROLATE 0.2 MG/ML IJ SOLN
INTRAMUSCULAR | Status: DC | PRN
  Administered 2021-09-13: .6 mg via INTRAVENOUS

## 2021-09-13 MED ORDER — FENTANYL CITRATE (PF) 100 MCG/2ML IJ SOLN
INTRAMUSCULAR | Status: DC | PRN
  Administered 2021-09-13: 12.5 ug via INTRAVENOUS
  Administered 2021-09-13: 50 ug via INTRAVENOUS
  Administered 2021-09-13: 12.5 ug via INTRAVENOUS
  Administered 2021-09-13: 50 ug via INTRAVENOUS

## 2021-09-13 MED ORDER — ONDANSETRON HCL 4 MG/2ML IJ SOLN: 4.0000 mg | Freq: Once | INTRAMUSCULAR | Status: DC | PRN

## 2021-09-13 MED ORDER — MIDAZOLAM HCL 2 MG/2ML IJ SOLN
INTRAMUSCULAR | Status: AC
  Filled 2021-09-13: qty 2

## 2021-09-13 MED ORDER — EPHEDRINE SULFATE (PRESSORS) 50 MG/ML IJ SOLN
INTRAMUSCULAR | Status: DC | PRN
  Administered 2021-09-13: 10 mg via INTRAVENOUS

## 2021-09-13 MED ORDER — BUPIVACAINE-EPINEPHRINE (PF) 0.5% -1:200000 IJ SOLN
INTRAMUSCULAR | Status: AC
  Filled 2021-09-13: qty 30

## 2021-09-13 MED ORDER — 0.9 % SODIUM CHLORIDE (POUR BTL) OPTIME
TOPICAL | Status: DC | PRN
  Administered 2021-09-13: 2000 mL

## 2021-09-13 SURGICAL SUPPLY — 46 items
BULB RESERV EVAC DRAIN JP 100C (MISCELLANEOUS) ×1 IMPLANT
CHLORAPREP W/TINT 26 (MISCELLANEOUS) ×1 IMPLANT
DRAIN CHANNEL JP 15F RND 16 (MISCELLANEOUS) ×1 IMPLANT
DRAPE LAPAROTOMY 100X77 ABD (DRAPES) ×2 IMPLANT
DRSG OPSITE POSTOP 4X10 (GAUZE/BANDAGES/DRESSINGS) ×1 IMPLANT
DRSG OPSITE POSTOP 4X8 (GAUZE/BANDAGES/DRESSINGS) ×1 IMPLANT
DRSG TEGADERM 4X4.75 (GAUZE/BANDAGES/DRESSINGS) ×1 IMPLANT
ELECT BLADE 6.5 EXT (BLADE) IMPLANT
ELECT CAUTERY BLADE 6.4 (BLADE) ×2 IMPLANT
ELECT REM PT RETURN 9FT ADLT (ELECTROSURGICAL) ×2
ELECTRODE REM PT RTRN 9FT ADLT (ELECTROSURGICAL) ×1 IMPLANT
GAUZE 4X4 16PLY ~~LOC~~+RFID DBL (SPONGE) ×1 IMPLANT
GLOVE SURG SYN 6.5 ES PF (GLOVE) ×12 IMPLANT
GLOVE SURG SYN 6.5 PF PI (GLOVE) ×2 IMPLANT
GLOVE SURG UNDER POLY LF SZ7 (GLOVE) ×3 IMPLANT
GOWN STRL REUS W/ TWL LRG LVL3 (GOWN DISPOSABLE) ×2 IMPLANT
GOWN STRL REUS W/TWL LRG LVL3 (GOWN DISPOSABLE) ×2
KIT TURNOVER KIT A (KITS) ×2 IMPLANT
LABEL OR SOLS (LABEL) ×2 IMPLANT
LIGASURE IMPACT 36 18CM CVD LR (INSTRUMENTS) ×1 IMPLANT
MANIFOLD NEPTUNE II (INSTRUMENTS) ×2 IMPLANT
NEEDLE HYPO 22GX1.5 SAFETY (NEEDLE) ×1 IMPLANT
NS IRRIG 1000ML POUR BTL (IV SOLUTION) ×3 IMPLANT
NS IRRIG 500ML POUR BTL (IV SOLUTION) ×1 IMPLANT
PACK BASIN MAJOR ARMC (MISCELLANEOUS) ×2 IMPLANT
PACK COLON CLEAN CLOSURE (MISCELLANEOUS) ×1 IMPLANT
RELOAD PROXIMATE 75MM BLUE (ENDOMECHANICALS) IMPLANT
RELOAD STAPLE 75 3.8 BLU REG (ENDOMECHANICALS) IMPLANT
SPONGE DRAIN TRACH 4X4 STRL 2S (GAUZE/BANDAGES/DRESSINGS) ×1 IMPLANT
SPONGE T-LAP 18X18 ~~LOC~~+RFID (SPONGE) ×8 IMPLANT
STAPLER PROXIMATE 75MM BLUE (STAPLE) IMPLANT
STAPLER SKIN PROX 35W (STAPLE) ×2 IMPLANT
SUT ETHILON 3-0 FS-10 30 BLK (SUTURE) ×2
SUT PDS AB 1 TP1 54 (SUTURE) ×4 IMPLANT
SUT SILK 2 0 (SUTURE)
SUT SILK 2-0 18XBRD TIE 12 (SUTURE) ×1 IMPLANT
SUT SILK 3 0 (SUTURE) ×1
SUT SILK 3-0 (SUTURE) ×1 IMPLANT
SUT SILK 3-0 18XBRD TIE 12 (SUTURE) ×1 IMPLANT
SUT VIC AB 3-0 SH 27 (SUTURE) ×2
SUT VIC AB 3-0 SH 27X BRD (SUTURE) ×2 IMPLANT
SUTURE EHLN 3-0 FS-10 30 BLK (SUTURE) IMPLANT
SYR 20ML LL LF (SYRINGE) ×2 IMPLANT
TRAY FOLEY MTR SLVR 16FR STAT (SET/KITS/TRAYS/PACK) ×1 IMPLANT
TRAY FOLEY SLVR 16FR LF STAT (SET/KITS/TRAYS/PACK) ×1 IMPLANT
WATER STERILE IRR 500ML POUR (IV SOLUTION) ×2 IMPLANT

## 2021-09-13 NOTE — ED Provider Notes (Signed)
? ?[facility] ?Provider Note ? ? ? Event Date/Time  ? First MD Initiated Contact with Patient 09/13/21 1935   ?  (approximate) ? ?History  ? ?Chief Complaint: Abnormal Lab ? ?HPI ? ?Maria [name] is a 51 y.o. female with a past medical history of anemia, anxiety, hypertension, alcohol use, presents to the emergency department for generalized weakness fatigue abnormal lab work continued abdominal pain.  According to the patient over the past 6 days now she has been experiencing abdominal pain initially with nausea and vomiting, nausea and vomiting have resolved.  Patient was seen in the emergency department 6 days ago for the same at that time had a urine showing possible UTI was given fosfomycin.  She had a CT scan showing possible gastritis otherwise no significant findings.  Here patient continues to state mild to moderate upper abdominal discomfort.  No known fever.  No diarrhea. ? ?Physical Exam  ? ?Triage Vital Signs: ?ED Triage Vitals  ?Enc Vitals Group  ?   BP 09/13/21 1816 (!) 97/57  ?   Pulse Rate 09/13/21 1816 86  ?   Resp 09/13/21 1816 20  ?   Temp --   ?   Temp src --   ?   SpO2 09/13/21 1816 100 %  ?   Weight 09/13/21 1817 116 lb (52.6 kg)  ?   Height 09/13/21 1817 '5\' 2"'$  (1.575 m)  ?   Head Circumference --   ?   Peak Flow --   ?   Pain Score 09/13/21 1817 9  ?   Pain Loc --   ?   Pain Edu? --   ?   Excl. in [place]? --   ? ? ?Most recent vital signs: ?Vitals:  ? 09/13/21 1816 09/13/21 1925  ?BP: (!) 97/57 (!) 112/59  ?Pulse: 86 90  ?Resp: 20 18  ?SpO2: 100% 100%  ? ? ?General: Awake, no distress.  ?CV:  Good peripheral perfusion.  Regular rate and rhythm  ?Resp:  Normal effort.  Equal breath sounds bilaterally.  ?Abd:  Soft, mild to moderate epigastric tenderness to palpation otherwise benign abdomen without rebound or guarding.  No distention. ? ? ? ?ED Results / Procedures / Treatments  ? ?RADIOLOGY ? ?I personally reviewed the CT images, CT tech was concern for possible free air.   On my evaluation the images patient appears to have a very large amount of free air within the abdomen. ? ? ?MEDICATIONS ORDERED IN ED: ?Medications  ?sodium chloride 0.9 % bolus 1,000 mL (has no administration in time range)  ?0.9 %  sodium chloride infusion (has no administration in time range)  ?sodium chloride 0.9 % bolus 1,000 mL (0 mLs Intravenous Stopped 09/13/21 1927)  ? ? ? ?IMPRESSION / MDM / ASSESSMENT AND PLAN / ED COURSE  ?I reviewed the triage vital signs and the nursing notes. ? ?Patient presents emergency department for abnormal lab work continued upper abdominal discomfort.  Patient does states she drinks approximately 3 alcoholic drinks daily but stopped when she became nauseated 1 week ago.  Patient's labs today show significant leukocytosis of 20,000, chemistry shows renal failure with a creatinine of 3.2 and a baseline creatinine is normal.  Bilirubin is elevated to 3.7 but otherwise LFTs are normal.  Sodium of 127.  We will add on a lipase.  We will continue with IV hydration I have ordered a second liter bolus for the patient and then normal saline infusion to follow.  We  will repeat a CT scan without contrast given the patient's renal insufficiency and continued upper abdominal pain and leukocytosis.  Urinalysis and urine culture from last visit grew out E. coli greater than 100,000 colonies.  Patient received fosfomycin we will recheck urinalysis to ensure no new or worsening infection.  Patient will require admission to the hospital service once the emergency department work-up is been completed given her renal failure.  Patient agreeable to plan of care. ? ?Patient appears to have a large amount of free air within the abdomen.  I spoke to Dr. Lysle Pearl of general surgery who is reviewing the CT images.  I will start the patient on IV Zosyn. ?Radiologist is called confirming CT shows large amount of air. ? ?Dr. Lysle Pearl will be in shortly to speak to the patient.  We will treat pain nausea  continued IV hydrate.  We will obtain preop labs including a type and screen and COVID swab.  I discussed the plan with the patient who is agreeable. ? ?CRITICAL CARE ?Performed by: Harvest Dark ? ? ?Total critical care time: 30 minutes ? ?Critical care time was exclusive of separately billable procedures and treating other patients. ? ?Critical care was necessary to treat or prevent imminent or life-threatening deterioration. ? ?Critical care was time spent personally by me on the following activities: development of treatment plan with patient and/or surrogate as well as nursing, discussions with consultants, evaluation of patient's response to treatment, examination of patient, obtaining history from patient or surrogate, ordering and performing treatments and interventions, ordering and review of laboratory studies, ordering and review of radiographic studies, pulse oximetry and re-evaluation of patient's condition. ? ? ?FINAL CLINICAL IMPRESSION(S) / ED DIAGNOSES  ? ?Abdominal free air ?Acute renal insufficiency ?Dehydration ?Abdominal pain ? ? ?Note:  This document was prepared using Dragon voice recognition software and may include unintentional dictation errors. ?  Harvest Dark, MD ?09/13/21 2022 ? ?

## 2021-09-13 NOTE — Assessment & Plan Note (Signed)
Controlled and even low today. ? ?Discussed to increase hydration as tolerated.  ?Remain off of blood pressure treatment. ?

## 2021-09-13 NOTE — Telephone Encounter (Signed)
Given recent lab results and recommendation to go to the ED, I cannot provide her a return to work letter at this time.  She will likely require hospitalization for AKI secondary to dehydration. ?

## 2021-09-13 NOTE — Telephone Encounter (Signed)
Received call from patient. Was seen in office today. Needs a work note for 3/12-3/16 go back to work on 3/17. Will print off mychart when ready.  ?

## 2021-09-13 NOTE — Anesthesia Preprocedure Evaluation (Addendum)
Anesthesia Evaluation  ?Patient identified by MRN, date of birth, ID band ?Patient awake ? ? ? ?Reviewed: ?Allergy & Precautions, NPO status , Patient's Chart, lab work & pertinent test results ? ?History of Anesthesia Complications ?Negative for: history of anesthetic complications ? ?Airway ?Mallampati: III ? ? ?Neck ROM: Full ? ? ? Dental ? ?(+) Poor Dentition,  ?  ?Pulmonary ?Current Smoker (1.5 ppd) and Patient abstained from smoking.,  ?  ?Pulmonary exam normal ?breath sounds clear to auscultation ? ? ? ? ? ? Cardiovascular ?hypertension, Normal cardiovascular exam ?Rhythm:Regular Rate:Normal ? ?ECG 09/07/21:  ?Sinus rhythm ?Probable left atrial enlargement ?RSR' in V1 or V2, probably normal variant ?  ?Neuro/Psych ?PSYCHIATRIC DISORDERS Anxiety Depression negative neurological ROS ?   ? GI/Hepatic ?GERD  ,Perforated gastric ulcer ?  ?Endo/Other  ?Pancreatitis  ? Renal/GU ?negative Renal ROS  ? ?  ?Musculoskeletal ? ? Abdominal ?  ?Peds ? Hematology ?negative hematology ROS ?(+)   ?Anesthesia Other Findings ? ? Reproductive/Obstetrics ? ?  ? ? ? ? ? ? ? ? ? ? ? ? ? ?  ?  ? ? ? ? ? ? ? ?Anesthesia Physical ?Anesthesia Plan ? ?ASA: 3 and emergent ? ?Anesthesia Plan: General  ? ?Post-op Pain Management:   ? ?Induction: Intravenous and Rapid sequence ? ?PONV Risk Score and Plan: 2 and Ondansetron, Dexamethasone and Treatment may vary due to age or medical condition ? ?Airway Management Planned: Oral ETT ? ?Additional Equipment:  ? ?Intra-op Plan:  ? ?Post-operative Plan: Extubation in OR ? ?Informed Consent: I have reviewed the patients History and Physical, chart, labs and discussed the procedure including the risks, benefits and alternatives for the proposed anesthesia with the patient or authorized representative who has indicated his/her understanding and acceptance.  ? ? ? ?Dental advisory given ? ?Plan Discussed with: CRNA ? ?Anesthesia Plan Comments: (Patient consented for  risks of anesthesia including but not limited to:  ?- adverse reactions to medications ?- damage to eyes, teeth, lips or other oral mucosa ?- nerve damage due to positioning  ?- sore throat or hoarseness ?- damage to heart, brain, nerves, lungs, other parts of body or loss of life ? ?Informed patient about role of CRNA in peri- and intra-operative care.  Patient voiced understanding.)  ? ? ? ? ? ? ?Anesthesia Quick Evaluation ? ?

## 2021-09-13 NOTE — Assessment & Plan Note (Addendum)
Immunizations up-to-date.  Patient declines Shingrix vaccines as she has never had chickenpox. ? ?Mammogram overdue, orders placed. ?Colonoscopy overdue, referral placed to GI. ?Referral placed for lung cancer screening as she does have a heavy smoking history. ? ?In general, we discussed the importance of a healthy diet and regular exercise. ? ?Exam today as noted. ?Labs pending. ? ?

## 2021-09-13 NOTE — Assessment & Plan Note (Signed)
Currently not taking B12 supplements. ? ?

## 2021-09-13 NOTE — Op Note (Signed)
Preoperative diagnosis: perforated peptic ulcer ?  ?Postoperative diagnosis: same ?  ?Procedure:   ?exploratory laparotomy  ?primary closure of perforated peptic ulcer Graham patch repair,  ?intrabdominal abscess drainage,  ?gastric biopsy ? ? ?Anesthesia: GETA ?  ?Surgeon: [name] ?  ?Wound Classification: clean contaminated ?  ?Specimen: none ?  ?Complications: None ?  ?Estimated Blood Loss: 132m ?  ?Indications:see HPI ?  ?Findings: ?Infection of the abdomen due to perforated peptic ulcer  ?Primary closure of ulcer, GPhillip Healpatch repair  ?Adequate hemostasis ?  ?Description of procedure: The patient was brought to the operating room and general anesthesia was induced. A time-out was completed verifying correct patient, procedure, site, positioning, and implant(s) and/or special equipment prior to beginning this procedure. Antibiotics were administered prior to making the incision. SCDs placed. The anterior abdominal wall was prepped and draped in the standard sterile fashion.  ?  ?Midline subxiphoid incision was made and dissection carried down to the fascia and abdominal entered.  Extensive inflammatory response with exudative buildup and soft adhesions noted throughout abdomen, approximating liver, omentum, stomach, and transverse colon. Gentle retraction of the enlarged liver and extremely thick and inflamed omentum visualized an anterior gastric perforation with copious amounts of bile still leaking from it.   ? ?Biopsy taken at site of perforation, passed off field pending pathology. The tissue around it otherwise felt strong enough to hold a 3-0 silk suture so attempt made at the primary closure using interrupted 3-0 silk suture x3.  The suture remained intact and the perforation was primarily closed.  Part of the adjacent omentum was then placed over the repair and additional 3-0 silk x3 placed around repair, tail ends used to secure the omentum as a GPhillip Healpatch.  Surrounding area was then extensively  suctioned and irrigated until clear output was noted.  No evidence of active bleeding noted. Two fluid collections noted on CT posterior to left lobe and within omentum noted to have copious amounts of purulent drainage that was drained completely.   ?  ?155French round drain was then placed over the repair and placed through LUQ, secured with 3-0 nylon.  Midline incision was then closed with 1 PDS x2, exparel infused to the area and skin approximated using interrupted 3-0 Vicryl, closed with staples.  Incision then dressed with honeycomb dressing, drain site dressed with drain dressing. ?  ?Patient was then successfully awakened and transferred to PACU in stable condition.  At the end of the procedure sponge and instrument counts were correct.  NG unable to be placed due to aberrant anatomy, transferred to PACU. ?

## 2021-09-13 NOTE — Progress Notes (Signed)
? ?Subjective:  ? ? Patient ID: Maria [name], female    DOB: 1970-10-16, 51 y.o.   MRN: [id number] ? ?HPI ? ?Maria [name] is a very pleasant 51 y.o. female who presents today for complete physical and follow up of chronic conditions.  She also presents today for ED follow-up. ? ?She presented to [facility] ED on 09/07/2021 for abdominal cramping, nausea with vomiting, diarrhea which began 3 to 5 days prior.  During her stay in the ED lab testing was negative for acute pancreatitis.  White blood cell count was slightly elevated at 13.6, no anemia.  She tested negative for COVID-19.  UA was positive, large leuks, greater than 50 white blood cell counts.  She was treated with 1 dose of fosfomycin for potential UTI. ? ?She underwent CT abdomen pelvis which showed thickening of the gastric wall, hepatic steatosis ptosis and evidence of chronic pancreatitis.  She was negative for diverticulitis. ? ?She was discharged home later that morning with prescriptions for Zofran, Bentyl, PCP follow-up for repeat labs and follow-up of fatty liver and aortic atherosclerosis found on CT scan. ? ?Since her ED visit she feels fatigued. She denies vomiting and diarrhea since her ED visit. She denies knowing a history of chronic pancreatitis, she did have one episode of pancreatitis five years ago. She is drinking water, sweet tea, and Gatorade and is tolerating. Her appetite remains low. She's been eating a BRAT diet.  ? ?She continues notice right sided abdominal pain, denies epigastric pain. She's been taking dicyclomine, not sure if it is helping. She has never seen GI.  ? ? ?Immunizations: ?-Tetanus: 2016 ?-Influenza: Completed this season ?-Covid-19: 3 vaccines ?-Shingles: Never completed, never had chicken pox ? ?Diet: Fair diet.  ?Exercise: No regular exercise. ? ?Eye exam: Completed years ago ?Dental exam: Completes regularly   ? ?Pap Smear: Hysterectomy  ?Mammogram: Completed in  ?Colonoscopy: Never completed ? ?Lung Cancer Screening:  Never completed. Smoked for the last 25 years, 1-1.5 PPD.  ? ?BP Readings from Last 3 Encounters:  ?09/13/21 (!) 96/52  ?09/07/21 125/72  ?07/20/21 122/68  ? ? ? ? ? ? ?Review of Systems  ?Constitutional:  Positive for fatigue. Negative for fever and unexpected weight change.  ?HENT:  Negative for rhinorrhea.   ?Eyes:  Negative for visual disturbance.  ?Respiratory:  Negative for cough and shortness of breath.   ?Cardiovascular:  Negative for chest pain.  ?Gastrointestinal:  Positive for abdominal pain. Negative for constipation and diarrhea.  ?Genitourinary:  Negative for difficulty urinating.  ?Musculoskeletal:  Negative for arthralgias and myalgias.  ?Skin:  Negative for rash.  ?Allergic/Immunologic: Negative for environmental allergies.  ?Neurological:  Negative for dizziness and headaches.  ?Psychiatric/Behavioral:  The patient is not nervous/anxious.   ? ?   ? ? ?Past Medical History:  ?Diagnosis Date  ? Anemia   ? Anxiety   ? h/o  ? Depression   ? h/o  ? Family history of adverse reaction to anesthesia   ? mom-hard time waking up  ? GERD (gastroesophageal reflux disease)   ? tums prn  ? Hypertension   ? WAS PUT ON BP MED BY PCP LAST YEAR (2018) AND BP MED MADE PT SICK SO SHE STOPPED TAKING IT-PCP MONITORS BP NOW  ? Pancreatitis   ? ? ?Social History  ? ?Socioeconomic History  ? Marital status: Married  ?  Spouse name: Not on file  ? Number of children: Not on file  ? Years of education: Not on file  ?  Highest education level: Not on file  ?Occupational History  ? Not on file  ?Tobacco Use  ? Smoking status: Every Day  ?  Packs/day: 1.00  ?  Years: 20.00  ?  Pack years: 20.00  ?  Types: Cigarettes  ? Smokeless tobacco: Never  ?Vaping Use  ? Vaping Use: Never used  ?Substance and Sexual Activity  ? Alcohol use: Yes  ?  Alcohol/week: 2.0 standard drinks  ?  Types: 2 Glasses of wine per week  ?  Comment: two glasses wine/beer every other day  ? Drug use: No  ? Sexual activity: Yes  ?Other Topics Concern  ? Not on  file  ?Social History Narrative  ? Married.  ? 1 child.   ? Working at [employer] at [employer].    ? Enjoys swimming, camping  ? ?Social Determinants of Health  ? ?Financial Resource Strain: Not on file  ?Food Insecurity: Not on file  ?Transportation Needs: Not on file  ?Physical Activity: Not on file  ?Stress: Not on file  ?Social Connections: Not on file  ?Intimate Partner Violence: Not on file  ? ? ?Past Surgical History:  ?Procedure Laterality Date  ? ANTERIOR AND POSTERIOR REPAIR WITH SACROSPINOUS FIXATION N/A 03/13/2018  ? Procedure: ANTERIOR REPAIR;  Surgeon: [name], MD;  Location: [facility] ORS;  Service: Gynecology;  Laterality: N/A;  ? INDUCED ABORTION    ? x2  ? VAGINAL HYSTERECTOMY N/A 03/13/2018  ? Procedure: HYSTERECTOMY VAGINAL;  Surgeon: [name], MD;  Location: [facility] ORS;  Service: Gynecology;  Laterality: N/A;  ? ? ?Family History  ?Problem Relation Age of Onset  ? Arthritis Mother   ? Arthritis Father   ? Alcohol abuse Father   ? Hypertension Father   ? Heart disease Father   ? Aneurysm Father   ? Breast cancer Maternal Grandmother   ? Breast cancer Paternal Grandmother   ? ? ?Allergies  ?Allergen Reactions  ? Bee Venom Swelling  ? Codeine Swelling  ? Bupropion Anxiety and Other (See Comments)  ?  Worsened anxiety  ? ? ?Current Outpatient Medications on File Prior to Visit  ?Medication Sig Dispense Refill  ? cyanocobalamin ([employer] VITAMIN B12) 2000 MCG tablet Take 1 tablet (2,000 mcg total) by mouth daily.    ? cyclobenzaprine (FLEXERIL) 10 MG tablet Take 0.5-1 tablets (5-10 mg total) by mouth 3 (three) times daily as needed for muscle spasms (sedation caution). 30 tablet 0  ? EPINEPHrine 0.3 mg/0.3 mL IJ SOAJ injection Inject 0.3 mLs (0.3 mg total) into the muscle as needed for anaphylaxis. 1 Device 0  ? fluticasone (FLONASE) 50 MCG/ACT nasal spray Place 2 sprays into both nostrils daily. 16 g 1  ? folic acid (FOLVITE) 1 MG tablet Take 1 tablet (1 mg total) by mouth daily.    ? hydrOXYzine  (ATARAX/VISTARIL) 10 MG tablet Take 1-2 tablets (10-20 mg total) by mouth 3 (three) times daily as needed for itching. 20 tablet 0  ? Multiple Vitamin (MULTIVITAMIN) tablet Take 1 tablet by mouth daily.    ? omeprazole (PRILOSEC) 20 MG capsule Take 1 capsule (20 mg total) by mouth daily. 30 capsule 0  ? ondansetron (ZOFRAN) 4 MG tablet Take 1 tablet (4 mg total) by mouth every 8 (eight) hours as needed for nausea or vomiting. 20 tablet 0  ? thiamine (VITAMIN B-1) 50 MG tablet Take 1 tablet (50 mg total) by mouth daily.    ? dicyclomine (BENTYL) 10 MG capsule Take 1 capsule (10  mg total) by mouth 4 (four) times daily -  before meals and at bedtime for 3 days. 12 capsule 0  ? ?No current facility-administered medications on file prior to visit.  ? ? ?BP (!) 96/52   Pulse 90   Ht '5\' 2"'$  (1.575 m)   Wt 116 lb 9.6 oz (52.9 kg)   LMP 02/24/2018 (Approximate) Comment: spotting   BMI 21.33 kg/m?  ?Objective:  ? Physical Exam ?Constitutional:   ?   Comments: Appears fatigued  ?HENT:  ?   Right Ear: Tympanic membrane and ear canal normal.  ?   Left Ear: Tympanic membrane and ear canal normal.  ?   Nose: Nose normal.  ?Eyes:  ?   Conjunctiva/sclera: Conjunctivae normal.  ?   Pupils: Pupils are equal, round, and reactive to light.  ?Neck:  ?   Thyroid: No thyromegaly.  ?Cardiovascular:  ?   Rate and Rhythm: Normal rate and regular rhythm.  ?   Heart sounds: No murmur heard. ?Pulmonary:  ?   Effort: Pulmonary effort is normal.  ?   Breath sounds: Normal breath sounds. No rales.  ?Abdominal:  ?   General: Bowel sounds are normal.  ?   Palpations: Abdomen is soft.  ?   Tenderness: There is generalized abdominal tenderness.  ?Musculoskeletal:     ?   General: Normal range of motion.  ?   Cervical back: Neck supple.  ?Lymphadenopathy:  ?   Cervical: No cervical adenopathy.  ?Skin: ?   General: Skin is warm and dry.  ?   Findings: No rash.  ?Neurological:  ?   Mental Status: She is alert and oriented to person, place, and time.   ?   Cranial Nerves: No cranial nerve deficit.  ?   Deep Tendon Reflexes: Reflexes are normal and symmetric.  ?Psychiatric:     ?   Mood and Affect: Mood normal.  ? ? ? ? ? ?   ?Assessment & Plan:  ? ? ? ? ?This vis

## 2021-09-13 NOTE — ED Notes (Signed)
Pt arrival to ED room, resting comfortably in bed, denies needs at this time. Pt notified of need for urine sample, states she does not feel the need to urinate, but will attempt to provide a sample soon. ?

## 2021-09-13 NOTE — Patient Instructions (Signed)
You will be contacted regarding your referral to GI for pancreatitis and for the colonoscopy. You will also be contacted regarding your lung cancer screening.  Please let us know if you have not been contacted within two weeks.  ? ?Call the [facility] to schedule your mammogram.  ? ?Stop by the lab prior to leaving today. I will notify you of your results once received.  ? ?Continue to work on a brat diet, continue with hydration with water and electrolytes. ? ?It was a pleasure to see you today! ? ?Preventive Care 41-52 Years Old, Female ?Preventive care refers to lifestyle choices and visits with your health care provider that can promote health and wellness. Preventive care visits are also called wellness exams. ?What can I expect for my preventive care visit? ?Counseling ?Your health care provider may ask you questions about your: ?Medical history, including: ?Past medical problems. ?Family medical history. ?Pregnancy history. ?Current health, including: ?Menstrual cycle. ?Method of birth control. ?Emotional well-being. ?Home life and relationship well-being. ?Sexual activity and sexual health. ?Lifestyle, including: ?Alcohol, nicotine or tobacco, and drug use. ?Access to firearms. ?Diet, exercise, and sleep habits. ?Work and work Statistician. ?Sunscreen use. ?Safety issues such as seatbelt and bike helmet use. ?Physical exam ?Your health care provider will check your: ?Height and weight. These may be used to calculate your BMI (body mass index). BMI is a measurement that tells if you are at a healthy weight. ?Waist circumference. This measures the distance around your waistline. This measurement also tells if you are at a healthy weight and may help predict your risk of certain diseases, such as type 2 diabetes and high blood pressure. ?Heart rate and blood pressure. ?Body temperature. ?Skin for abnormal spots. ?What immunizations do I need? ?Vaccines are usually given at various ages, according to a  schedule. Your health care provider will recommend vaccines for you based on your age, medical history, and lifestyle or other factors, such as travel or where you work. ?What tests do I need? ?Screening ?Your health care provider may recommend screening tests for certain conditions. This may include: ?Lipid and cholesterol levels. ?Diabetes screening. This is done by checking your blood sugar (glucose) after you have not eaten for a while (fasting). ?Pelvic exam and Pap test. ?Hepatitis B test. ?Hepatitis C test. ?HIV (human immunodeficiency virus) test. ?STI (sexually transmitted infection) testing, if you are at risk. ?Lung cancer screening. ?Colorectal cancer screening. ?Mammogram. Talk with your health care provider about when you should start having regular mammograms. This may depend on whether you have a family history of breast cancer. ?BRCA-related cancer screening. This may be done if you have a family history of breast, ovarian, tubal, or peritoneal cancers. ?Bone density scan. This is done to screen for osteoporosis. ?Talk with your health care provider about your test results, treatment options, and if necessary, the need for more tests. ?Follow these instructions at home: ?Eating and drinking ? ?Eat a diet that includes fresh fruits and vegetables, whole grains, lean protein, and low-fat dairy products. ?Take vitamin and mineral supplements as recommended by your health care provider. ?Do not drink alcohol if: ?Your health care provider tells you not to drink. ?You are pregnant, may be pregnant, or are planning to become pregnant. ?If you drink alcohol: ?Limit how much you have to 0-1 drink a day. ?Know how much alcohol is in your drink. In the [place] one drink equals one 12 oz bottle of beer (355 mL), one 5 oz glass of wine (  148 mL), or one 1? oz glass of hard liquor (44 mL). ?Lifestyle ?Brush your teeth every morning and night with fluoride toothpaste. Floss one time each day. ?Exercise for at least  30 minutes 5 or more days each week. ?Do not use any products that contain nicotine or tobacco. These products include cigarettes, chewing tobacco, and vaping devices, such as e-cigarettes. If you need help quitting, ask your health care provider. ?Do not use drugs. ?If you are sexually active, practice safe sex. Use a condom or other form of protection to prevent STIs. ?If you do not wish to become pregnant, use a form of birth control. If you plan to become pregnant, see your health care provider for a prepregnancy visit. ?Take aspirin only as told by your health care provider. Make sure that you understand how much to take and what form to take. Work with your health care provider to find out whether it is safe and beneficial for you to take aspirin daily. ?Find healthy ways to manage stress, such as: ?Meditation, yoga, or listening to music. ?Journaling. ?Talking to a trusted person. ?Spending time with friends and family. ?Minimize exposure to UV radiation to reduce your risk of skin cancer. ?Safety ?Always wear your seat belt while driving or riding in a vehicle. ?Do not drive: ?If you have been drinking alcohol. Do not ride with someone who has been drinking. ?When you are tired or distracted. ?While texting. ?If you have been using any mind-altering substances or drugs. ?Wear a helmet and other protective equipment during sports activities. ?If you have firearms in your house, make sure you follow all gun safety procedures. ?Seek help if you have been physically or sexually abused. ?What's next? ?Visit your health care provider once a year for an annual wellness visit. ?Ask your health care provider how often you should have your eyes and teeth checked. ?Stay up to date on all vaccines. ?This information is not intended to replace advice given to you by your health care provider. Make sure you discuss any questions you have with your health care provider. ?Document Revised: 12/14/2020 Document Reviewed:  12/14/2020 ?Elsevier Patient Education ? [place]. ? ?

## 2021-09-13 NOTE — OR Nursing (Addendum)
[facility] PACU RN aware that pt needs T&S as previous was "not good" per [employer].  Levada Dy RN acknowledged and will collect in pacu.  ?.  ?

## 2021-09-13 NOTE — Assessment & Plan Note (Signed)
Likely secondary to pancreatitis/gastritis from recent ED visit. ? ?Fortunately symptoms of nausea and vomiting have resolved. ? ?Advance diet as tolerated. ?

## 2021-09-13 NOTE — Telephone Encounter (Signed)
[name] called with critical lab:  BUN 87 mg/dl.  Maria [name] notified at 4:30 pm.   ?

## 2021-09-13 NOTE — Assessment & Plan Note (Addendum)
Identified on CT abdomen pelvis from March 2023. ?Reviewed results with patient. ? ?Given this finding we will refer her to GI for further monitoring.  Discussed to discontinue Bentyl if no improvement in symptoms. ? ?Repeat lipase and other labs pending. ? ?ED notes, labs, imaging reviewed. ?

## 2021-09-13 NOTE — Anesthesia Procedure Notes (Signed)
Procedure Name: Intubation ?Date/Time: 09/13/2021 9:57 PM ?Performed by: Lendon Colonel, CRNA ?Pre-anesthesia Checklist: Patient identified, Patient being monitored, Timeout performed, Emergency Drugs available and Suction available ?Patient Re-evaluated:Patient Re-evaluated prior to induction ?Oxygen Delivery Method: Circle system utilized ?Preoxygenation: Pre-oxygenation with 100% oxygen ?Induction Type: IV induction, Rapid sequence and Cricoid Pressure applied ?Laryngoscope Size: 3 and McGraph ?Grade View: Grade I ?Tube type: Oral ?Tube size: 7.0 mm ?Number of attempts: 1 ?Airway Equipment and Method: Stylet ?Placement Confirmation: ETT inserted through vocal cords under direct vision, positive ETCO2 and breath sounds checked- equal and bilateral ?Secured at: 21 cm ?Tube secured with: Tape ?Dental Injury: Teeth and Oropharynx as per pre-operative assessment  ? ? ? ? ?

## 2021-09-13 NOTE — H&P (Signed)
Subjective:  ? ?CC: Pneumoperitoneum ? ?HPI: ? Maria [name] is a 51 y.o. female who was consulted by [facility] for issue above.  Symptoms were first noted several days ago. Pain is sharp increasing worse in the epigastric region.  Associated with nausea, exacerbated by nothing specific ?  ?Past Medical History:  has a past medical history of Anemia, Anxiety, Depression, Family history of adverse reaction to anesthesia, Gastritis (08/19/2020), GERD (gastroesophageal reflux disease), Hypertension, Palpitations (05/18/2020), Pancreatitis, and Poison ivy dermatitis (11/13/2019). ? ?Past Surgical History:  ?Past Surgical History:  ?Procedure Laterality Date  ? ANTERIOR AND POSTERIOR REPAIR WITH SACROSPINOUS FIXATION N/A 03/13/2018  ? Procedure: ANTERIOR REPAIR;  Surgeon: [name], MD;  Location: [facility] ORS;  Service: Gynecology;  Laterality: N/A;  ? INDUCED ABORTION    ? x2  ? VAGINAL HYSTERECTOMY N/A 03/13/2018  ? Procedure: HYSTERECTOMY VAGINAL;  Surgeon: [name], MD;  Location: [facility] ORS;  Service: Gynecology;  Laterality: N/A;  ? ? ?Family History: family history includes Alcohol abuse in her father; Aneurysm in her father; Arthritis in her father and mother; Breast cancer in her maternal grandmother and paternal grandmother; Heart disease in her father; Hypertension in her father. ? ?Social History:  reports that she has been smoking cigarettes. She has a 20.00 pack-year smoking history. She has never used smokeless tobacco. She reports current alcohol use of about 2.0 standard drinks per week. She reports that she does not use drugs. ? ?Current Medications:  ?Prior to Admission medications   ?Medication Sig Start Date End Date Taking? Authorizing Provider  ?cyanocobalamin ([employer] VITAMIN B12) 2000 MCG tablet Take 1 tablet (2,000 mcg total) by mouth daily. 02/02/21   [name], MD  ?dicyclomine (BENTYL) 10 MG capsule Take 1 capsule (10 mg total) by mouth 4 (four) times daily -  before meals and at  bedtime for 3 days. 09/07/21 09/10/21  [name], MD  ?EPINEPHrine 0.3 mg/0.3 mL IJ SOAJ injection Inject 0.3 mLs (0.3 mg total) into the muscle as needed for anaphylaxis. 11/11/18   [name], NP  ?fluticasone (FLONASE) 50 MCG/ACT nasal spray Place 2 sprays into both nostrils daily. 07/20/21   [name], MD  ?folic acid (FOLVITE) 1 MG tablet Take 1 tablet (1 mg total) by mouth daily. 02/02/21   [name], MD  ?hydrOXYzine (ATARAX/VISTARIL) 10 MG tablet Take 1-2 tablets (10-20 mg total) by mouth 3 (three) times daily as needed for itching. 11/13/19   [name], NP  ?Multiple Vitamin (MULTIVITAMIN) tablet Take 1 tablet by mouth daily.    [provider]  ?omeprazole (PRILOSEC) 20 MG capsule Take 1 capsule (20 mg total) by mouth daily. 09/06/21   [name], FNP  ?ondansetron (ZOFRAN) 4 MG tablet Take 1 tablet (4 mg total) by mouth every 8 (eight) hours as needed for nausea or vomiting. 09/06/21   [name], FNP  ?thiamine (VITAMIN B-1) 50 MG tablet Take 1 tablet (50 mg total) by mouth daily. 05/29/21   [name], MD  ? ? ?Allergies:  ?Allergies as of 09/13/2021 - Review Complete 09/13/2021  ?Allergen Reaction Noted  ? Bee venom Swelling 03/05/2018  ? Codeine Swelling 12/20/2015  ? Bupropion Anxiety and Other (See Comments) 02/04/2014  ? ? ?ROS:  ?General: Denies weight loss, weight gain, fatigue, fevers, chills, and night sweats. ?Eyes: Denies blurry vision, double vision, eye pain, itchy eyes, and tearing. ?Ears: Denies hearing loss, earache, and ringing in ears. ?Nose: Denies sinus pain, congestion,  infections, runny nose, and nosebleeds. ?Mouth/throat: Denies hoarseness, sore throat, bleeding gums, and difficulty swallowing. ?Heart: Denies chest pain, palpitations, racing heart, irregular heartbeat, leg pain or swelling, and decreased activity tolerance. ?Respiratory: Denies breathing difficulty, shortness of breath, wheezing, cough, and sputum. ?GI: Denies  change in appetite, heartburn, nausea, vomiting, constipation, diarrhea, and blood in stool. ?GU: Denies difficulty urinating, pain with urinating, urgency, frequency, blood in urine. ?Musculoskeletal: Denies joint stiffness, pain, swelling, muscle weakness. ?Skin: Denies rash, itching, mass, tumors, sores, and boils ?Neurologic: Denies headache, fainting, dizziness, seizures, numbness, and tingling. ?Psychiatric: Denies depression, anxiety, difficulty sleeping, and memory loss. ?Endocrine: Denies heat or cold intolerance, and increased thirst or urination. ?Blood/lymph: Denies easy bruising, easy bruising, and swollen glands ? ? ?  ?Objective:  ?  ? ?BP (!) 108/58   Pulse 85   Temp (!) 97.3 ?F (36.3 ?C) (Oral)   Resp 17   Ht '5\' 2"'$  (1.575 m)   Wt 52.6 kg   LMP 02/24/2018 (Approximate) Comment: spotting   SpO2 99%   BMI 21.22 kg/m?  ? ?Constitutional :  alert, cooperative, appears stated age, and no distress  ?Lymphatics/Throat:  no asymmetry, masses, or scars  ?Respiratory:  clear to auscultation bilaterally  ?Cardiovascular:  regular rate and rhythm  ?Gastrointestinal: Soft, voluntary guarding in the epigastric region and tenderness to palpation .   ?Musculoskeletal: Steady movement  ?Skin: Cool and moist, no surgical scars   ?Psychiatric: Normal affect, non-agitated, not confused  ?   ?  ?LABS:  ?CMP Latest Ref Rng & Units 09/13/2021 09/13/2021 09/07/2021  ?Glucose 70 - 99 mg/dL 97 89 150(H)  ?BUN 6 - 20 mg/dL 90(H) 87(HH) 49(H)  ?Creatinine 0.44 - 1.00 mg/dL 3.22(H) 2.99(H) 1.32(H)  ?Sodium 135 - 145 mmol/L 127(L) 128(L) 134(L)  ?Potassium 3.5 - 5.1 mmol/L 3.6 4.0 3.0(L)  ?Chloride 98 - 111 mmol/L 87(L) 87(L) 93(L)  ?CO2 22 - 32 mmol/L '27 29 24  '$ ?Calcium 8.9 - 10.3 mg/dL 9.3 9.0 9.3  ?Total Protein 6.5 - 8.1 g/dL 6.6 5.2(L) 7.8  ?Total Bilirubin 0.3 - 1.2 mg/dL 3.7(H) 3.5(H) 1.6(H)  ?Alkaline Phos 38 - 126 U/L 275(H) 320(H) 211(H)  ?AST 15 - 41 U/L 29 26 42(H)  ?ALT 0 - 44 U/L '15 14 25  '$ ? ?CBC Latest Ref Rng  & Units 09/13/2021 09/07/2021 05/17/2021  ?WBC 4.0 - 10.5 K/uL 20.2(H) 13.6(H) 4.7  ?Hemoglobin 12.0 - 15.0 g/dL 11.4(L) 14.3 13.2  ?Hematocrit 36.0 - 46.0 % 32.1(L) 40.9 38.2  ?Platelets 150 - 400 K/uL 147(L) 126(L) 117.0(L)  ? ? ?RADS: ?CLINICAL DATA:  Acute abdominal pain ?  ?EXAM: ?CT ABDOMEN AND PELVIS WITHOUT CONTRAST ?  ?TECHNIQUE: ?Multidetector CT imaging of the abdomen and pelvis was performed ?following the standard protocol without IV contrast. ?  ?RADIATION DOSE REDUCTION: This exam was performed according to the ?departmental dose-optimization program which includes automated ?exposure control, adjustment of the mA and/or kV according to ?patient size and/or use of iterative reconstruction technique. ?  ?COMPARISON:  09/07/2021 ?  ?FINDINGS: ?Lower chest: Mild atelectasis along the right hemidiaphragm. ?  ?Hepatobiliary: Prominent lateral segment left hepatic lobe with ?morphology suspicious for hepatic cirrhosis. High density in the ?gallbladder favoring vicarious excretion of prior contrast ?administration. ?  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 ?  ?Spleen: The spleen measures 11.3 by 7.1 by 13.2 cm (volume = 550 ?cm^3), compatible with splenomegaly. ?  ?Adrenals/Urinary Tract: Unremarkable ?  ?  Stomach/Bowel: There is a large perforated ulcer of the anterior ?gastric antrum in direct communication with free intraperitoneal gas ?on image 41 of series 2. There is a large amount of free ?intraperitoneal gas anterior to the liver. Sigmoid colon ?diverticulosis. There are air fluid levels in the descending colon ?and in nondilated loops of small bowel. ?  ?Vascular/Lymphatic: Atherosclerosis is present, including aortoiliac ?atherosclerotic disease. No pathologic adenopathy identified. ?  ?Reproductive: Uterus absent.  Adnexa unremarkable. ?  ?Other: 5.6 by 2.4 cm collection of contained gas and  fluid posterior ?to the left hepatic lobe on image 19 series 2, this is just anterior ?to the gastric cardia, abscess not excluded. New 4.5 by 2.0 cm fluid ?density with some enhancement along its margins in the omentum

## 2021-09-13 NOTE — ED Triage Notes (Signed)
Pt presents to ER from home.  Pt states she had labs drawn today and was told to come to ER for increased creatinine.  CRT drawn today showed 2.99 and BUN 87.  Pt reports decreased UO, weakness and lethargy for around one week.  Pt A&O x4 at this time in NAD.   ?

## 2021-09-14 ENCOUNTER — Inpatient Hospital Stay: Payer: BC Managed Care – PPO

## 2021-09-14 ENCOUNTER — Encounter: Payer: Self-pay | Admitting: Surgery

## 2021-09-14 DIAGNOSIS — E871 Hypo-osmolality and hyponatremia: Secondary | ICD-10-CM | POA: Diagnosis present

## 2021-09-14 DIAGNOSIS — D689 Coagulation defect, unspecified: Secondary | ICD-10-CM | POA: Diagnosis present

## 2021-09-14 DIAGNOSIS — K860 Alcohol-induced chronic pancreatitis: Secondary | ICD-10-CM | POA: Diagnosis present

## 2021-09-14 DIAGNOSIS — W19XXXA Unspecified fall, initial encounter: Secondary | ICD-10-CM | POA: Diagnosis not present

## 2021-09-14 DIAGNOSIS — K7460 Unspecified cirrhosis of liver: Secondary | ICD-10-CM | POA: Diagnosis present

## 2021-09-14 DIAGNOSIS — R1013 Epigastric pain: Secondary | ICD-10-CM | POA: Diagnosis present

## 2021-09-14 DIAGNOSIS — I10 Essential (primary) hypertension: Secondary | ICD-10-CM | POA: Diagnosis present

## 2021-09-14 DIAGNOSIS — D62 Acute posthemorrhagic anemia: Secondary | ICD-10-CM | POA: Diagnosis not present

## 2021-09-14 DIAGNOSIS — D6959 Other secondary thrombocytopenia: Secondary | ICD-10-CM | POA: Diagnosis present

## 2021-09-14 DIAGNOSIS — Z20822 Contact with and (suspected) exposure to covid-19: Secondary | ICD-10-CM | POA: Diagnosis present

## 2021-09-14 DIAGNOSIS — R6520 Severe sepsis without septic shock: Secondary | ICD-10-CM | POA: Diagnosis not present

## 2021-09-14 DIAGNOSIS — E870 Hyperosmolality and hypernatremia: Secondary | ICD-10-CM | POA: Diagnosis not present

## 2021-09-14 DIAGNOSIS — E43 Unspecified severe protein-calorie malnutrition: Secondary | ICD-10-CM | POA: Diagnosis present

## 2021-09-14 DIAGNOSIS — K651 Peritoneal abscess: Secondary | ICD-10-CM | POA: Diagnosis present

## 2021-09-14 DIAGNOSIS — K668 Other specified disorders of peritoneum: Secondary | ICD-10-CM | POA: Diagnosis not present

## 2021-09-14 DIAGNOSIS — K567 Ileus, unspecified: Secondary | ICD-10-CM | POA: Diagnosis not present

## 2021-09-14 DIAGNOSIS — G928 Other toxic encephalopathy: Secondary | ICD-10-CM | POA: Diagnosis not present

## 2021-09-14 DIAGNOSIS — K275 Chronic or unspecified peptic ulcer, site unspecified, with perforation: Secondary | ICD-10-CM | POA: Diagnosis present

## 2021-09-14 DIAGNOSIS — F1020 Alcohol dependence, uncomplicated: Secondary | ICD-10-CM | POA: Diagnosis present

## 2021-09-14 DIAGNOSIS — K279 Peptic ulcer, site unspecified, unspecified as acute or chronic, without hemorrhage or perforation: Secondary | ICD-10-CM | POA: Diagnosis present

## 2021-09-14 DIAGNOSIS — D638 Anemia in other chronic diseases classified elsewhere: Secondary | ICD-10-CM | POA: Diagnosis present

## 2021-09-14 DIAGNOSIS — J9601 Acute respiratory failure with hypoxia: Secondary | ICD-10-CM | POA: Diagnosis not present

## 2021-09-14 DIAGNOSIS — A419 Sepsis, unspecified organism: Secondary | ICD-10-CM | POA: Diagnosis not present

## 2021-09-14 DIAGNOSIS — T8132XA Disruption of internal operation (surgical) wound, not elsewhere classified, initial encounter: Secondary | ICD-10-CM | POA: Diagnosis not present

## 2021-09-14 DIAGNOSIS — R188 Other ascites: Secondary | ICD-10-CM | POA: Diagnosis present

## 2021-09-14 DIAGNOSIS — N179 Acute kidney failure, unspecified: Secondary | ICD-10-CM | POA: Diagnosis not present

## 2021-09-14 DIAGNOSIS — R000 Tachycardia, unspecified: Secondary | ICD-10-CM | POA: Diagnosis not present

## 2021-09-14 DIAGNOSIS — K5720 Diverticulitis of large intestine with perforation and abscess without bleeding: Secondary | ICD-10-CM | POA: Diagnosis present

## 2021-09-14 DIAGNOSIS — Y838 Other surgical procedures as the cause of abnormal reaction of the patient, or of later complication, without mention of misadventure at the time of the procedure: Secondary | ICD-10-CM | POA: Diagnosis not present

## 2021-09-14 DIAGNOSIS — F32A Depression, unspecified: Secondary | ICD-10-CM | POA: Diagnosis present

## 2021-09-14 DIAGNOSIS — K255 Chronic or unspecified gastric ulcer with perforation: Secondary | ICD-10-CM | POA: Diagnosis present

## 2021-09-14 DIAGNOSIS — E440 Moderate protein-calorie malnutrition: Secondary | ICD-10-CM | POA: Diagnosis not present

## 2021-09-14 HISTORY — DX: Chronic or unspecified peptic ulcer, site unspecified, with perforation: K27.5

## 2021-09-14 LAB — URINALYSIS, ROUTINE W REFLEX MICROSCOPIC
Bilirubin Urine: NEGATIVE
Glucose, UA: NEGATIVE mg/dL
Ketones, ur: NEGATIVE mg/dL
Leukocytes,Ua: NEGATIVE
Nitrite: NEGATIVE
Protein, ur: NEGATIVE mg/dL
Specific Gravity, Urine: 1.013 (ref 1.005–1.030)
pH: 5 (ref 5.0–8.0)

## 2021-09-14 LAB — GLUCOSE, CAPILLARY
Glucose-Capillary: 93 mg/dL (ref 70–99)
Glucose-Capillary: 94 mg/dL (ref 70–99)
Glucose-Capillary: 97 mg/dL (ref 70–99)
Glucose-Capillary: 98 mg/dL (ref 70–99)

## 2021-09-14 LAB — COMPREHENSIVE METABOLIC PANEL
ALT: 14 U/L (ref 0–44)
AST: 28 U/L (ref 15–41)
Albumin: 1.7 g/dL — ABNORMAL LOW (ref 3.5–5.0)
Alkaline Phosphatase: 263 U/L — ABNORMAL HIGH (ref 38–126)
Anion gap: 10 (ref 5–15)
BUN: 77 mg/dL — ABNORMAL HIGH (ref 6–20)
CO2: 23 mmol/L (ref 22–32)
Calcium: 7.8 mg/dL — ABNORMAL LOW (ref 8.9–10.3)
Chloride: 97 mmol/L — ABNORMAL LOW (ref 98–111)
Creatinine, Ser: 2.5 mg/dL — ABNORMAL HIGH (ref 0.44–1.00)
GFR, Estimated: 23 mL/min — ABNORMAL LOW (ref >60)
Glucose, Bld: 92 mg/dL (ref 70–99)
Potassium: 3.3 mmol/L — ABNORMAL LOW (ref 3.5–5.1)
Sodium: 130 mmol/L — ABNORMAL LOW (ref 135–145)
Total Bilirubin: 3.2 mg/dL — ABNORMAL HIGH (ref 0.3–1.2)
Total Protein: 5.8 g/dL — ABNORMAL LOW (ref 6.5–8.1)

## 2021-09-14 LAB — BLOOD GAS, ARTERIAL
Acid-base deficit: 6.3 mmol/L — ABNORMAL HIGH (ref 0.0–2.0)
Bicarbonate: 19.7 mmol/L — ABNORMAL LOW (ref 20.0–28.0)
FIO2: 60 %
MECHVT: 400 mL
O2 Saturation: 100 %
PEEP: 5 cmH2O
Patient temperature: 37
RATE: 16 resp/min
pCO2 arterial: 40 mmHg (ref 32–48)
pH, Arterial: 7.3 — ABNORMAL LOW (ref 7.35–7.45)
pO2, Arterial: 240 mmHg — ABNORMAL HIGH (ref 83–108)

## 2021-09-14 LAB — PROTIME-INR
INR: 1.6 — ABNORMAL HIGH (ref 0.8–1.2)
Prothrombin Time: 19.1 seconds — ABNORMAL HIGH (ref 11.4–15.2)

## 2021-09-14 LAB — CBC WITH DIFFERENTIAL/PLATELET
Abs Immature Granulocytes: 1.18 10*3/uL — ABNORMAL HIGH (ref 0.00–0.07)
Basophils Absolute: 0 10*3/uL (ref 0.0–0.1)
Basophils Relative: 0 %
Eosinophils Absolute: 0 10*3/uL (ref 0.0–0.5)
Eosinophils Relative: 0 %
HCT: 31.2 % — ABNORMAL LOW (ref 36.0–46.0)
Hemoglobin: 10.8 g/dL — ABNORMAL LOW (ref 12.0–15.0)
Immature Granulocytes: 4 %
Lymphocytes Relative: 4 %
Lymphs Abs: 1.3 10*3/uL (ref 0.7–4.0)
MCH: 35.5 pg — ABNORMAL HIGH (ref 26.0–34.0)
MCHC: 34.6 g/dL (ref 30.0–36.0)
MCV: 102.6 fL — ABNORMAL HIGH (ref 80.0–100.0)
Monocytes Absolute: 0.5 10*3/uL (ref 0.1–1.0)
Monocytes Relative: 2 %
Neutro Abs: 29.2 10*3/uL — ABNORMAL HIGH (ref 1.7–7.7)
Neutrophils Relative %: 90 %
Platelets: 196 10*3/uL (ref 150–400)
RBC: 3.04 MIL/uL — ABNORMAL LOW (ref 3.87–5.11)
RDW: 12 % (ref 11.5–15.5)
WBC: 32.2 10*3/uL — ABNORMAL HIGH (ref 4.0–10.5)
nRBC: 0 % (ref 0.0–0.2)

## 2021-09-14 LAB — LACTIC ACID, PLASMA
Lactic Acid, Venous: 1.2 mmol/L (ref 0.5–1.9)
Lactic Acid, Venous: 0.7 mmol/L (ref 0.5–1.9)

## 2021-09-14 LAB — APTT: aPTT: 34 seconds (ref 24–36)

## 2021-09-14 LAB — AMMONIA: Ammonia: 66 umol/L — ABNORMAL HIGH (ref 9–35)

## 2021-09-14 LAB — TROPONIN I (HIGH SENSITIVITY)
Troponin I (High Sensitivity): 72 ng/L — ABNORMAL HIGH (ref <18)
Troponin I (High Sensitivity): 35 ng/L — ABNORMAL HIGH (ref <18)

## 2021-09-14 LAB — TYPE AND SCREEN
ABO/RH(D): O POS
Antibody Screen: NEGATIVE

## 2021-09-14 LAB — CORTISOL: Cortisol, Plasma: 22 ug/dL

## 2021-09-14 LAB — MRSA NEXT GEN BY PCR, NASAL: MRSA by PCR Next Gen: NOT DETECTED

## 2021-09-14 MED ORDER — PROPOFOL 1000 MG/100ML IV EMUL
5.0000 ug/kg/min | INTRAVENOUS | Status: DC
  Filled 2021-09-14: qty 100

## 2021-09-14 MED ORDER — FENTANYL CITRATE PF 50 MCG/ML IJ SOSY
50.0000 ug | PREFILLED_SYRINGE | INTRAMUSCULAR | Status: DC | PRN
  Administered 2021-09-14 – 2021-09-16 (×2): 50 ug via INTRAVENOUS
  Filled 2021-09-14 (×2): qty 1

## 2021-09-14 MED ORDER — FOLIC ACID 5 MG/ML IJ SOLN
1.0000 mg | Freq: Every day | INTRAMUSCULAR | Status: DC
  Administered 2021-09-14 – 2021-10-10 (×26): 1 mg via INTRAVENOUS
  Filled 2021-09-14 (×28): qty 0.2

## 2021-09-14 MED ORDER — FENTANYL 2500MCG IN NS 250ML (10MCG/ML) PREMIX INFUSION
50.0000 ug/h | INTRAVENOUS | Status: DC
  Administered 2021-09-14: 50 ug/h via INTRAVENOUS
  Filled 2021-09-14: qty 250

## 2021-09-14 MED ORDER — FENTANYL CITRATE (PF) 100 MCG/2ML IJ SOLN
100.0000 ug | Freq: Once | INTRAMUSCULAR | Status: AC
  Administered 2021-09-14: 100 ug via INTRAVENOUS

## 2021-09-14 MED ORDER — PANTOPRAZOLE INFUSION (NEW) - SIMPLE MED
8.0000 mg/h | INTRAVENOUS | Status: DC
  Administered 2021-09-14: 8 mg/h via INTRAVENOUS
  Filled 2021-09-14: qty 100

## 2021-09-14 MED ORDER — SODIUM CHLORIDE 0.9 % IV BOLUS
1000.0000 mL | Freq: Once | INTRAVENOUS | Status: AC
Start: 2021-09-14 — End: 2021-09-14
  Administered 2021-09-14: 1000 mL via INTRAVENOUS

## 2021-09-14 MED ORDER — ONDANSETRON HCL 4 MG/2ML IJ SOLN
4.0000 mg | Freq: Four times a day (QID) | INTRAMUSCULAR | Status: DC | PRN
Start: 2021-09-14 — End: 2021-10-11
  Administered 2021-09-21 – 2021-10-10 (×7): 4 mg via INTRAVENOUS
  Filled 2021-09-14 (×7): qty 2

## 2021-09-14 MED ORDER — PANTOPRAZOLE SODIUM 40 MG IV SOLR
40.0000 mg | Freq: Two times a day (BID) | INTRAVENOUS | Status: DC
  Administered 2021-09-15 – 2021-10-10 (×49): 40 mg via INTRAVENOUS
  Filled 2021-09-14 (×50): qty 10

## 2021-09-14 MED ORDER — THIAMINE HCL 100 MG PO TABS: 100.0000 mg | ORAL_TABLET | Freq: Every day | ORAL | Status: DC

## 2021-09-14 MED ORDER — LORAZEPAM 2 MG/ML IJ SOLN: 1.0000 mg | INTRAMUSCULAR | Status: AC | PRN

## 2021-09-14 MED ORDER — ONDANSETRON HCL 4 MG PO TABS: 4.0000 mg | ORAL_TABLET | Freq: Four times a day (QID) | ORAL | Status: DC | PRN

## 2021-09-14 MED ORDER — NEOSTIGMINE METHYLSULFATE 10 MG/10ML IV SOLN
INTRAVENOUS | Status: AC
  Filled 2021-09-14: qty 1

## 2021-09-14 MED ORDER — THIAMINE HCL 100 MG/ML IJ SOLN
500.0000 mg | Freq: Every day | INTRAVENOUS | Status: AC
  Administered 2021-09-14 – 2021-09-16 (×3): 500 mg via INTRAVENOUS
  Filled 2021-09-14 (×3): qty 5

## 2021-09-14 MED ORDER — ETOMIDATE 2 MG/ML IV SOLN
20.0000 mg | Freq: Once | INTRAVENOUS | Status: AC
Start: 2021-09-14 — End: 2021-09-14
  Administered 2021-09-14: 20 mg via INTRAVENOUS

## 2021-09-14 MED ORDER — SODIUM CHLORIDE 0.9 % IV SOLN
INTRAVENOUS | Status: DC | PRN
Start: 2021-09-14 — End: 2021-10-11

## 2021-09-14 MED ORDER — DOCUSATE SODIUM 50 MG/5ML PO LIQD
100.0000 mg | Freq: Two times a day (BID) | ORAL | Status: DC
  Administered 2021-09-15: 100 mg
  Filled 2021-09-14 (×7): qty 10

## 2021-09-14 MED ORDER — LORAZEPAM 1 MG PO TABS: 1.0000 mg | ORAL_TABLET | ORAL | Status: AC | PRN

## 2021-09-14 MED ORDER — PROPOFOL 1000 MG/100ML IV EMUL
INTRAVENOUS | Status: AC
  Administered 2021-09-14: 10 ug/kg/min via INTRAVENOUS
  Filled 2021-09-14: qty 100

## 2021-09-14 MED ORDER — MIDAZOLAM HCL 2 MG/2ML IJ SOLN
INTRAMUSCULAR | Status: AC
  Filled 2021-09-14: qty 2

## 2021-09-14 MED ORDER — GADOBUTROL 1 MMOL/ML IV SOLN
7.0000 mL | Freq: Once | INTRAVENOUS | Status: AC | PRN
  Administered 2021-09-14: 7 mL via INTRAVENOUS

## 2021-09-14 MED ORDER — IOHEXOL 350 MG/ML SOLN
60.0000 mL | Freq: Once | INTRAVENOUS | Status: AC | PRN
  Administered 2021-09-14: 60 mL via INTRAVENOUS

## 2021-09-14 MED ORDER — FLUCONAZOLE IN SODIUM CHLORIDE 400-0.9 MG/200ML-% IV SOLN
400.0000 mg | Freq: Once | INTRAVENOUS | Status: AC
  Administered 2021-09-14: 400 mg via INTRAVENOUS
  Filled 2021-09-14: qty 200

## 2021-09-14 MED ORDER — SODIUM CHLORIDE 0.9 % IV SOLN: INTRAVENOUS | Status: DC

## 2021-09-14 MED ORDER — ROCURONIUM BROMIDE 50 MG/5ML IV SOLN
50.0000 mg | Freq: Once | INTRAVENOUS | Status: AC
  Administered 2021-09-14: 50 mg via INTRAVENOUS
  Filled 2021-09-14: qty 5

## 2021-09-14 MED ORDER — ENOXAPARIN SODIUM 30 MG/0.3ML IJ SOSY
30.0000 mg | PREFILLED_SYRINGE | Freq: Every day | INTRAMUSCULAR | Status: DC
  Administered 2021-09-14 – 2021-09-16 (×3): 30 mg via SUBCUTANEOUS
  Filled 2021-09-14 (×3): qty 0.3

## 2021-09-14 MED ORDER — PIPERACILLIN-TAZOBACTAM 3.375 G IVPB
3.3750 g | Freq: Three times a day (TID) | INTRAVENOUS | Status: AC
  Administered 2021-09-14 – 2021-09-18 (×14): 3.375 g via INTRAVENOUS
  Filled 2021-09-14 (×13): qty 50

## 2021-09-14 MED ORDER — CHLORHEXIDINE GLUCONATE CLOTH 2 % EX PADS
6.0000 | MEDICATED_PAD | Freq: Every day | CUTANEOUS | Status: DC
  Administered 2021-09-14 – 2021-09-17 (×3): 6 via TOPICAL

## 2021-09-14 MED ORDER — FLUCONAZOLE IN SODIUM CHLORIDE 200-0.9 MG/100ML-% IV SOLN
200.0000 mg | INTRAVENOUS | Status: AC
  Administered 2021-09-15 – 2021-10-03 (×18): 200 mg via INTRAVENOUS
  Filled 2021-09-14 (×20): qty 100

## 2021-09-14 MED ORDER — ACETAMINOPHEN 10 MG/ML IV SOLN
1000.0000 mg | Freq: Four times a day (QID) | INTRAVENOUS | Status: AC
  Administered 2021-09-14 – 2021-09-15 (×4): 1000 mg via INTRAVENOUS
  Filled 2021-09-14 (×4): qty 100

## 2021-09-14 MED ORDER — POLYETHYLENE GLYCOL 3350 17 G PO PACK
17.0000 g | PACK | Freq: Every day | ORAL | Status: DC
  Administered 2021-09-15: 17 g
  Filled 2021-09-14 (×2): qty 1

## 2021-09-14 MED ORDER — POTASSIUM CHLORIDE 10 MEQ/100ML IV SOLN
10.0000 meq | INTRAVENOUS | Status: AC
  Administered 2021-09-14 (×2): 10 meq via INTRAVENOUS
  Filled 2021-09-14 (×2): qty 100

## 2021-09-14 MED ORDER — PANTOPRAZOLE 80MG IVPB - SIMPLE MED
80.0000 mg | Freq: Once | INTRAVENOUS | Status: AC
  Administered 2021-09-14: 80 mg via INTRAVENOUS
  Filled 2021-09-14: qty 100

## 2021-09-14 MED ORDER — PANTOPRAZOLE SODIUM 40 MG IV SOLR: 40.0000 mg | Freq: Two times a day (BID) | INTRAVENOUS | Status: DC

## 2021-09-14 NOTE — Progress Notes (Signed)
Chaplain Maggie made initial visit with pt's mother. Pt was unresponsive during visit. Hospitality was offered and appreciated by family who drove down from [place] this morning to be with her daughter. Additional family expected later today.  ?

## 2021-09-14 NOTE — Progress Notes (Signed)
Pt came out of surgery at 00:04 with a blank stare in her face and was taking little shallow breathes. Pt was not responding and pupils were not reactive. Pt was posturing bilateral.Dr. Erenest Rasher was at bedside with CRNA and Dr Lysle Pearl was called into PACU and then was called a code stroke.Pt was taken to CT at 00:19 for head CT, and straight to ICU and intubated again.  ?

## 2021-09-14 NOTE — Progress Notes (Deleted)
Report to ICU Team  ?

## 2021-09-14 NOTE — Progress Notes (Signed)
Pharmacy Antibiotic Note ? ?Maria [name] is a 51 y.o. female admitted on 09/13/2021 with  intra-abdominal infection .  Pharmacy has been consulted for Zosyn dosing. ? ?Plan: ?Zosyn 3.375 gm IV Q8H EI ordered to start on 3/16 @ 0300.  ? ?Height: '5\' 2"'$  (157.5 cm) ?Weight: 53.5 kg (117 lb 15.1 oz) ?IBW/kg (Calculated) : 50.1 ? ?Temp (24hrs), Avg:97.6 ?F (36.4 ?C), Min:97.3 ?F (36.3 ?C), Max:97.9 ?F (36.6 ?C) ? ?Recent Labs  ?Lab 09/07/21 ?0905 09/13/21 ?1233 09/13/21 ?1822 09/14/21 ?0051  ?WBC 13.6*  --  20.2* 32.2*  ?CREATININE 1.32* 2.99* 3.22* 2.50*  ?LATICACIDVEN  --   --   --  1.2  ?  ?Estimated Creatinine Clearance: 21.1 mL/min (A) (by C-G formula based on SCr of 2.5 mg/dL (H)).   ? ?Allergies  ?Allergen Reactions  ? Bee Venom Swelling  ? Codeine Swelling  ? Bupropion Anxiety and Other (See Comments)  ?  Worsened anxiety  ? ? ?Antimicrobials this admission: ?  >>  ?  >>  ? ?Dose adjustments this admission: ? ? ?Microbiology results: ? BCx:  ? UCx:   ? Sputum:   ? MRSA PCR:  ? ?Thank you for allowing pharmacy to be a part of this patient?s care. ? ?Oakley Kossman D ?09/14/2021 2:44 AM ? ?

## 2021-09-14 NOTE — Progress Notes (Signed)
Patient is complaining of abdominal pain, no  PRN pain medications were ordered. Notified [place] NP, orders placed. ?

## 2021-09-14 NOTE — Procedures (Signed)
Extubation Procedure Note ? ?Patient Details:   ?Name: Maria [name] ?DOB: 03/23/1971 ?MRN: [id number] ?  ?Airway Documentation:  ?  ?Vent end date: 09/14/21 Vent end time: 0854  ? ?Evaluation ? O2 sats: stable throughout ?Complications: No apparent complications ?Patient did tolerate procedure well. ?Bilateral Breath Sounds: Clear, Diminished ?  ?Yes.  Able to cough and speak post procedure.  Extubated to room air without incident. ? ?Rayne Du Caz Weaver ?09/14/2021, 8:57 AM ? ?

## 2021-09-14 NOTE — Progress Notes (Signed)
?   09/14/21 1300  ?Clinical Encounter Type  ?Visited With Patient  ?Visit Type Initial  ?Referral From Nurse  ?Consult/Referral To Chaplain  ? ?Chaplain visited with patient. Patient unable to respond to Chaplain. Chaplain provided a compassionate presence and said silent prayer at bedside. ?

## 2021-09-14 NOTE — Progress Notes (Signed)
Patient transferred to the ICU with altered mental status post op requiring emergent re-intubation. Due to concerns of possible stroke, CTA head and Neck was ordered by Teleneurologist. Pulmonary embolism was also considered given abrupt change in respiratory status requiring MV. Discussed with [facility] radiologist and Teleneurologist concerns for contrast administration in a patient with significant renal impairment on presentation. Per Radiologist, recommend obtaining MRI Brain, MRA head and neck instead of CTA head and neck in addition to CTA chest/abd/pelvis to minimize contrast exposure. Discussed with neurologist as well who agreed with recommendations to proceed with MRA head and neck instead of CTA head and neck. ? ? ?Maria Falco, DNP, CCRN, FNP-C, AGACNP-BC ?Acute Care Nurse Practitioner  ?[facility] ?Pager: [phone] ?[place] at [facility] ? ?

## 2021-09-14 NOTE — Progress Notes (Signed)
Initial Nutrition Assessment ? ?DOCUMENTATION CODES:  ? ?Non-severe (moderate) malnutrition in context of social or environmental circumstances ? ?INTERVENTION:  ? ?RD will monitor for diet advancement vs the need for nutrition support  ? ?Pt at high refeed risk ? ?NUTRITION DIAGNOSIS:  ? 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 ? ?GOAL:  ? ?Patient will meet greater than or equal to 90% of their needs ? ?MONITOR:  ? ?Diet advancement, Labs, Weight trends, Skin, I & O's ? ?REASON FOR ASSESSMENT:  ? ?Malnutrition Screening Tool ?  ? ?ASSESSMENT:  ? ?51 y/o female with h/o GERD, anxiety, depression, etoh abuse and HTN who is admitted with perforated duodenal ulcer s/p Graham patch repair 3/15. ? ?Visited pt's room today. Pt lethargic and unable to provide any nutrition related history. Pt extubated this morning and is tolerating well. Pt remains NPO. RD will monitor for diet advancement vs the need for nutrition support. Pt is likely at high refeed risk. Per chart, pt appears weight stable pta. Drains in place with 19m output. Will request daily weights.    ? ?Medications reviewed and include: colace, lovenox, folic acid, protonix, miralax, thiamine, NaCl '@100ml'$ /hr, diflucan, zosyn  ? ?Labs reviewed: Na 130(L), K 3.3(L), BUN 77(H), creat 2.50(H), Ammonia- 66(H), tbili 3.2(H) ?Wbc- 32.2(H), Hgb 10.8(L), Hct 31.2(L), MCV 102.6(H), MCH 35.5(H) ? ?NUTRITION - FOCUSED PHYSICAL EXAM: ? ?Flowsheet Row Most Recent Value  ?Orbital Region Mild depletion  ?Upper Arm Region Severe depletion  ?Thoracic and Lumbar Region Mild depletion  ?Buccal Region Mild depletion  ?Temple Region Moderate depletion  ?Clavicle Bone Region Moderate depletion  ?Clavicle and Acromion Bone Region Moderate depletion  ?Scapular Bone Region Moderate depletion  ?Dorsal Hand Mild depletion  ?Patellar  Region Severe depletion  ?Anterior Thigh Region Severe depletion  ?Posterior Calf Region Severe depletion  ?Edema (RD Assessment) None  ?Hair Reviewed  ?Eyes Reviewed  ?Mouth Reviewed  ?Skin Reviewed  ?Nails Reviewed  ? ?Diet Order:   ?Diet Order   ? ?       ?  Diet NPO time specified  Diet effective now       ?  ? ?  ?  ? ?  ? ?EDUCATION NEEDS:  ? ?No education needs have been identified at this time ? ?Skin:  Skin Assessment: Reviewed RN Assessment (incision abdomen) ? ?Last BM:  pta ? ?Height:  ? ?Ht Readings from Last 1 Encounters:  ?09/14/21 '5\' 2"'$  (1.575 m)  ? ? ?Weight:  ? ?Wt Readings from Last 1 Encounters:  ?09/14/21 53.5 kg  ? ? ?Ideal Body Weight:  50 kg ? ?BMI:  Body mass index is 21.57 kg/m?. ? ?Estimated Nutritional Needs:  ? ?Kcal:  1600-1800kcal/day ? ?Protein:  80-90g/day ? ?Fluid:  1.6-1.8L/day ? ?CKoleen DistanceMS, RD, LDN ?Please refer to AMION for RD and/or RD on-call/weekend/after hours pager ? ?

## 2021-09-14 NOTE — Progress Notes (Signed)
Pt transported to MRI then to CT and returned to ICU on the vent without incident. Pt remains on the vent and is tolerating well at this time, ?

## 2021-09-14 NOTE — Procedures (Signed)
INTUBATION PROCEDURE NOTE ? ?Maria [name]  ?[id number]  ?05/05/1971 ? ?Date:09/14/21  ?Time:2:28 AM  ? ?Provider Performing:Alixandra Alfieri A Stephine Langbehn  ? ?Procedure: Intubation (41423) ? ?Indication(s): Respiratory Failure ? ?Consent: Unable to obtain consent due to emergent nature of procedure. ? ?Anesthesia: Etomidate, Fentanyl, and Rocuronium ? ?Time Out: Verified patient identification, verified procedure, site/side was marked, verified correct patient position, special equipment/implants available, medications/allergies/relevant history reviewed, required imaging and test results available. ? ?Sterile Technique: Usual hand hygeine, masks, and gloves were used ? ?Procedure Description: Patient positioned in bed supine.  Sedation given as noted above.  Patient was intubated with endotracheal tube  size 7.5 using Glidescope.  View was Grade 1 full glottis .  Number of attempts was 1.  Colorimetric CO2 detector was consistent with tracheal placement. ETT taped 23 cm @ the lip ? ?Complications/Tolerance: None; patient tolerated the procedure well. Chest X-ray is ordered to verify placement. ? ?EBL: Minimal ? ?Specimen(s): None ? ? ?[name], DNP, CCRN, FNP-C, AGACNP-BC ?Acute Care Nurse Practitioner  ?[facility] ?Pager: [phone] ?[place] at [facility] ? ?

## 2021-09-14 NOTE — OR Nursing (Signed)
Code stroke called per MD order. ICU team given report following transport after head CT.  ?

## 2021-09-14 NOTE — Progress Notes (Signed)
PHARMACY NOTE:  ANTIMICROBIAL RENAL DOSAGE ADJUSTMENT ? ?Current antimicrobial regimen includes a mismatch between antimicrobial dosage and estimated renal function.  As per policy approved by the Pharmacy & Therapeutics and Medical Executive Committees, the antimicrobial dosage will be adjusted accordingly. ? ?Current antimicrobial dosage: Diflucan 400 mg IV daily ? ?Indication: Perforated peptic ulcer ? ?Renal Function: ? ?Estimated Creatinine Clearance: 21.1 mL/min (A) (by C-G formula based on SCr of 2.5 mg/dL (H)). ?   ?Antimicrobial dosage has been changed to:  Diflucan 400 mg IV x 1 followed by Diflucan 200 mg IV daily ? ?Additional comments: Will continue to monitor renal function and adjust as indicated ? ? ?Thank you for allowing pharmacy to be a part of this patient's care. ? ?Benita Gutter, RPH ?09/14/2021 8:23 AM ? ? ?   ?

## 2021-09-14 NOTE — Progress Notes (Signed)
Patient Return from MRI and CT with no Incident  ? ?Vital signs stable throughout trip ?BP 109-115/60's ?HR 85-98 ?O2 100 ?RR 20 ?

## 2021-09-14 NOTE — Anesthesia Postprocedure Evaluation (Signed)
Anesthesia Post Note ? ?Patient: Maria [name] ? ?Procedure(s) Performed: EXPLORATORY LAPAROTOMY-RAM PATCH repair (Abdomen) ? ?Patient location during evaluation: ICU ?Anesthesia Type: General ?Level of consciousness: sedated and obtunded/minimal responses ?Vital Signs Assessment: post-procedure vital signs reviewed and stable ?Respiratory status: patient re-intubated ?Cardiovascular status: stable ?Postop Assessment: no apparent nausea or vomiting ?Anesthetic complications: no ?Comments: Patient with altered mental status postop.  Slow to emerge, but protecting airway, ventilating, and oxygenating well after extubation; not responding to commands, however.  In PACU, patient still not following commands and noted to be posturing with bilateral upper extremities.  Notified Dr. Lysle Pearl and called code stroke.  Pt transported to CT for emergent scan, then transported to ICU where she was re-intubated. ? ? ?No notable events documented. ? ? ?Last Vitals:  ?Vitals:  ? 09/13/21 2037 09/14/21 0004  ?BP: (!) 108/58 104/88  ?Pulse: 85 (!) 126  ?Resp: 17 (!) 27  ?Temp: (!) 36.3 ?C 36.6 ?C  ?SpO2: 99% 100%  ?  ?Last Pain:  ?Vitals:  ? 09/13/21 2107  ?TempSrc:   ?PainSc: 6   ? ? ?  ?  ?  ?  ?  ?  ? ?Darrin Nipper ? ? ? ? ?

## 2021-09-14 NOTE — Discharge Instructions (Addendum)
Substance Abuse Resources: ? ?1. [place] ?[address] ?[place], [place] [address] ?Phone number: [phone] ?Fax number: [phone] ?Walk-in Clinic: Monday- Friday 8:00 AM-8:00 PM (Crisis Individuals) ?Monday, Wednesday. Friday: 8:00 AM-3:30 PM (For assessments) ?Hospital Follow-Up (Schedule appointments) ? ?2. [facility] ?[address] ?[place], [place] [address] ?Phone number: [phone]) [phone] ?Fax number: [phone] ? ?3. [facility] ?[place] [address] ?Phone Number: [phone] ?Walk-in-Clinic: Monday- Friday 9:00 AM-4:00 PM ? ?4. [facility] ?[address] ?[place], [place] [address] ?Phone number: [phone] ? ?5. [facility] (Alcohol Treatment Program in [place]) ?[address] ?[place], [place] [address] ?Phone number: [phone] ?Fax number: [phone] ? ?6. Residential Administrator (Alcoholism Treatment Program in [place]) ?[place] (Main Office) ?[place], [place] [address] ?Phone number: [phone] ?[address] (Detox Facilities) ?[place], [place] [address] ?Phone number: [phone] ?Fax number: [phone] ? ?7. Living Engineer, civil (consulting) ([facility] [place]) ?[address] ?[place], [place] [address] ?Phone number: [phone] ? ?8. [place] ?[address]  ?[place], [place] [address] ?Phone number: [phone] ? ?Outpatient resources in [place]: ? ?[facility]  ?[facility] ?[address] ?[place], [place] [address]  ?[phone] ?Private insurance, Medicare A&B, and GCCN  ? ?[facility]  ?[address],  ?[place], [place] [address] ?[phone] ?Medicaid, Self Pay  ?[facility]  ?[address]  ?[place], [place] ?[phone] ?Medicaid and [employer], Self Pay  ? ?[facility] ?[address]  ?[place], [place]  ?[phone] ?Private Insurance, and Self Pay ? ?[facility]  ?[address]  ?[place], [place] [address]  ?[phone] or [phone] ?Private Insurance Only  ? ?[facility] ?[address]  ?[place], [place] ?[phone] ?Medicaid, Medicare, Private Insurance ? ?[place] [facility]  ?[facility] ?[address]  ?[place], [place] [address] ?[phone] ?Services are free or reduced ? ?[facility]  ?[address] ?[phone]  ?Self Pay only, sliding scale ? ?[facility]  ?[address]  ?[place], [place] [address] ?[phone] ?Educational psychologist) ?Self Pay, Medicaid Only  ? ?[facility] ?[place],  [address] ?[phone] ?Medicaid, Medicare, Private Insuranc ? ? ?You may benefit from repeat lab studies including B12, folate and iron/TIBC/ferritin panel in 4 to 6 weeks just to ensure no  further deficiencies causing anemia. Would not transfuse unless hemoglobin falls below 7. ? ?Empty JP drain as needed.  Flush tubing and continue with tube feeds as previously instructed. ? ?RLQ drain site dressing can be removed tomorrow and then keep covered with band-aid, changed daily until healed.  Ok to take shower. ?

## 2021-09-14 NOTE — Progress Notes (Signed)
?   09/14/21 0027  ?Clinical Encounter Type  ?Visited With Patient not available  ?Visit Type Code  ? ?Chaplain Burris responded to PACU then attended CT to assess status. Pt already moved to ICU following code stroke and unresponsive symptoms. ? ?Daryel November present in ICU and available to support staff, family if they may be arriving or as situation calls for ongoing support. ?

## 2021-09-14 NOTE — Procedures (Signed)
CENTRAL VENOUS CATHETER INSERTION PROCEDURE NOTE ? ?Maria [name]  ?[id number]  ?22-Dec-1970 ? ?Date:09/14/21  ?Time:2:21 AM  ? ?Provider Performing:Jovahn Breit A Davonda Ausley  ? ?Procedure: Insertion of Non-tunneled Central Venous Catheter(36556) with US guidance (45809)  ? ?Indication(s) ?Medication administration and Difficult access ? ?Consent ?Unable to obtain consent due to emergent nature of procedure. ? ?Anesthesia ?Topical only with 1% lidocaine  ? ?Timeout ?Verified patient identification, verified procedure, site/side was marked, verified correct patient position, special equipment/implants available, medications/allergies/relevant history reviewed, required imaging and test results available. ? ?Sterile Technique ?Maximal sterile technique including full sterile barrier drape, hand hygiene, sterile gown, sterile gloves, mask, hair covering, sterile ultrasound probe cover (if used). ? ?Procedure Description ?Area of catheter insertion was cleaned with chlorhexidine and draped in sterile fashion.  With real-time ultrasound guidance a central venous catheter was placed into the left internal jugular vein. Nonpulsatile blood flow and easy flushing noted in all ports.  The catheter was sutured in place and sterile dressing applied. ? ?Complications/Tolerance ?None; patient tolerated the procedure well. ?Chest X-ray is ordered to verify placement for internal jugular or subclavian cannulation.   Chest x-ray is not ordered for femoral cannulation. ? ?EBL ?Minimal ? ?Specimen(s) ?None ? ? ? ?Maria Falco, DNP, CCRN, FNP-C, AGACNP-BC ?Acute Care Nurse Practitioner  ?[facility] ?Pager: [phone] ?[place] [facility] ? ? ?

## 2021-09-14 NOTE — TOC Initial Note (Signed)
Transition of Care (TOC) - Initial/Assessment Note  ? ? ?Patient Details  ?Name: Maria [name] ?MRN: [id number] ?Date of Birth: [birth year] ? ?Transition of Care (TOC) CM/SW Contact:    ?Shelbie Hutching, RN ?Phone Number: ?09/14/2021, 12:00 PM ? ?Clinical Narrative:                 ?Patient admitted to the hospital with abd pain and perforated peptic ulcer, s/p exploratory lap with perforation repair and drain placement.   ?RNCM met with patient and her family at the bedside.  Patient is alert, she reports that she is sleepy.  RNCM introduced self and explained role in DC planning.  Patient is from home with her husband, she is independent works in Advertising account planner at [employer], she drives.  She is current with her PCP Alma Friendly, [facility].   ? ?TOC consult for substance abuse resources.  Patient endorses smoking cigarettes, which she now says she is done with.  She also endorses alcohol use, wine at night with dinner.  She feels that she does drink to much and accepted resource list for substance abuse treatment.   ? ?TOC will follow.   ? ?Expected Discharge Plan: Home/Self Care ?Barriers to Discharge: Continued Medical Work up ? ? ?Patient Goals and CMS Choice ?Patient states their goals for this hospitalization and ongoing recovery are:: Patient would like some ice chips-  to be well and get back home ?  ?  ? ?Expected Discharge Plan and Services ?Expected Discharge Plan: Home/Self Care ?  ?Discharge Planning Services: CM Consult ?  ?Living arrangements for the past 2 months: [place] ?                ?DME Arranged: N/A ?DME Agency: NA ?  ?  ?  ?HH Arranged: NA ?[place] Agency: NA ?  ?  ?  ? ?Prior Living Arrangements/Services ?Living arrangements for the past 2 months: [place] ?Lives with:: Spouse ?Patient language and need for interpreter reviewed:: Yes ?Do you feel safe going back to the place where you live?: Yes      ?Need for Family Participation in Patient Care:  Yes (Comment) ?Care giver support system in place?: Yes (comment) (husband) ?  ?Criminal Activity/Legal Involvement Pertinent to Current Situation/Hospitalization: No - Comment as needed ? ?Activities of Daily Living ?Home Assistive Devices/Equipment: None ?ADL Screening (condition at time of admission) ?Patient's cognitive ability adequate to safely complete daily activities?: No ?Is the patient deaf or have difficulty hearing?: No ?Does the patient have difficulty seeing, even when wearing glasses/contacts?: No ?Does the patient have difficulty concentrating, remembering, or making decisions?: Yes ?Patient able to express need for assistance with ADLs?: Yes ?Does the patient have difficulty dressing or bathing?: Yes ?Independently performs ADLs?: No ?Communication: Dependent ?Is this a change from baseline?: Change from baseline, expected to last >3 days ?Dressing (OT): Dependent ?Is this a change from baseline?: Change from baseline, expected to last >3 days ?Grooming: Dependent ?Is this a change from baseline?: Change from baseline, expected to last >3 days ?Feeding: Dependent ?Is this a change from baseline?: Change from baseline, expected to last >3 days ?Bathing: Dependent ?Is this a change from baseline?: Change from baseline, expected to last >3 days ?Toileting: Dependent ?Is this a change from baseline?: Change from baseline, expected to last >3days ?In/Out Bed: Dependent ?Is this a change from baseline?: Change from baseline, expected to last >3 days ?Walks in Home: Dependent ?Is this  a change from baseline?: Change from baseline, expected to last >3 days ?Does the patient have difficulty walking or climbing stairs?: Yes ?Weakness of Legs: Both ?Weakness of Arms/Hands: Both ? ?Permission Sought/Granted ?Permission sought to share information with : Case Manager, Family Supports ?Permission granted to share information with : Yes, Verbal Permission Granted ? Share Information with NAME: Aoife Bold ?    ? Permission granted to share info w Relationship: husband ? Permission granted to share info w Contact Information: [phone] ? ?Emotional Assessment ?Appearance:: Appears stated age ?Attitude/Demeanor/Rapport: Engaged ?Affect (typically observed): Accepting ?Orientation: : Oriented to Self, Oriented to Place, Oriented to  Time, Oriented to Situation ?Alcohol / Substance Use: Alcohol Use, Tobacco Use ?Psych Involvement: No (comment) ? ?Admission diagnosis:  Perforated peptic ulcer ([place]) [K27.5] ?Patient Active Problem List  ? Diagnosis Date Noted  ? Perforated peptic ulcer ([place]) 09/14/2021  ? Chronic pancreatitis ([place]) 09/13/2021  ? Anesthesia complication, initial encounter 09/13/2021  ? Heartburn 09/06/2021  ? Bilious vomiting with nausea 09/06/2021  ? Acute cough 07/28/2021  ? Back pain 07/23/2021  ? Vitamin B1 deficiency 05/29/2021  ? Macrocytosis 05/17/2021  ? Skin lump of arm, right 05/17/2021  ? Thrombocytopenia ([place]) 02/05/2021  ? History of seasonal allergies 05/18/2020  ? Fatigue 12/09/2019  ? Depression, recurrent ([place]) 11/13/2019  ? Folate deficiency 07/30/2019  ? Vitamin D deficiency 07/30/2019  ? Hyperglycemia 07/30/2019  ? Encounter for annual general medical examination with abnormal findings in adult 07/30/2019  ? Bee sting allergy 11/11/2018  ? Uterine prolapse 02/28/2018  ? Vitamin B12 deficiency 01/17/2018  ? Right carpal tunnel syndrome 11/13/2017  ? Essential hypertension 12/24/2016  ? Peripheral neuropathy 12/24/2016  ? ?PCP:  [name], NP ?[facility]   [id number]?Phone: [phone]: [phone]? ? ? ? ?Social Determinants of Health (SDOH) Interventions ?  ? ?Readmission Risk Interventions ?No flowsheet data found. ? ? ?

## 2021-09-14 NOTE — Consult Note (Addendum)
? ? ?NAME:  Maria [name], MRN:  [id number], DOB:  24-Aug-1970, LOS: 0 ?ADMISSION DATE:  09/13/2021, CONSULTATION DATE:  09/14/2021 ?REFERRING MD: [name], DO CHIEF COMPLAINT:  abdominal pain  ? ? ?HPI  ?51 y.o with significant PMH of EtOH abuse, chronic alcoholic pancreatitis, anemia, anxiety and depression, hypertension, and GERD who presented to the ED with chief complaints of progressive abdominal pain worse in the epigastric region  associated with nausea vomiting, generalized weakness and fatigue x 1 week. ? ?Patient was previously seen in the ED on 09/07/2021 with similar complaints.  Work-up at that time did not reveal any acute finding.  Patient had a CT abdomen pelvis which showed possible gastritis otherwise no significant findings.  Symptoms improved and patient was discharged with return to ED for worsening symptoms.  Patient followed up with her PCP yesterday for continued abdominal pain and generalized weakness.  She had labs drawn and was noted to have abnormal lab finding of BUN/creatinine 87/2.99 therefore was sent to the ED for further evaluation. ? ?ED Course: In the emergency department, the temperature was 97.3 (36.3?C), the heart rate  83 beats/minute, the blood pressure  mm Hg, the respiratory rate 17 breaths/minute, and the oxygen saturation 99 % on RA.  Labs revealed : Na+/, K+: 127/3.6, Glucose: 97, BUN/Cr.:90/3.22, WBC/ TMAX: 20.2/ afebrile, Hgb/Hct: 11.4/32.1, Plts:147, COVID PCR: NegativeCT abdomen pelvis was obtained and showed perforated ulcers of the gastric antrum anteriorly, with directly visualized collection of gas in the stomach with free intraperitoneal gas and new suspected abscess along the omentum.  Findings were discussed with on-call general surgery who recommended emergent ex lap.  Patient started on empiric IV Zosyn following IV fluid resuscitation and was taken to the OR. ? ?Patient underwent exploratory lap with primary closure of perforated peptic ulcers and drainage  of intra-abdominal abscess.  Patient successfully extubated and taken to PACU postop.  While recovering in the PACU, patient had a change in neuro status with abnormal flexion of arms to chest bilaterally concerning for posturing and possible ischemic event.  Due to concerns for possible encephalopathy from poor clearance of sedating medication versus acute intracranial abnormality versus anoxic injury, code stroke was initiated.  Initial NIH stroke scale 26.  CT head was obtained which showed no acute intracranial abnormality.  Patient evaluated by telemetry neurologist who recommended further evaluation with CTA head and neck.  Patient was transported to the ICU and PCCM consulted to assist with management.  On arrival to the ICU, patient was noted to be obtunded, unresponsive with increased work of breathing on NRB requiring emergent reintubation for airway protection. ? ?Past Medical History  ?EtOH abuse, chronic alcoholic pancreatitis, anemia, anxiety and depression, hypertension, and GERD  ? ?Significant Hospital Events   ?3/15: Presenting with abdominal pain found to have perforated gastric ulcer s/p repair ?3/16: Acute change in mental status postop with abnormal flexion of stick chest bilaterally concerning for posturing.  Patient evaluated by teleneurologist stat CT head negative.  Transferred to the ICU and emergently intubated.  PCCM consulted ? ?Consults:  ?PCCM ?Neurology ? ?Procedures:  ?3/15: exploratory laparotomy primary closure of perforated peptic ulcer Graham patch repair, intrabdominal abscess drainage,  gastric biopsy ?3/16: Left radial arterial line ?3/16: Left IJ placement ?3/16: Endotracheal intubation ? ?Significant Diagnostic Tests:  ?3/15: CT abdomen pelvis> perforated ulcer of the gastric antrum anteriorly, with directly visualized connection of gas in the stomach with free ?intraperitoneal gas on image 41 series 2. New suspected abscess along the omentum,  image 51 series 2. Abscess or  contained gas and fluid posterior to the lateral segment left hepatic lobe on image 19 series 2. ?3/16: Noncontrast CT head> no acute intracranial abnormality ?3/16: MRI brain> no acute intracranial abnormality ?3/16: MRA head and neck> no acute intracranial abnormality ?3/16: CTA chest/ abdomen and pelvis> ? ?Micro Data:  ?3/15: SARS-CoV-2 PCR> negative ?3/15: Influenza PCR> negative ?3/15: Blood culture x2> ?3/15: Urine Culture> ?3/15: MRSA PCR>>  ? ?Antimicrobials:  ?Zosyn 3/15>> ? ?OBJECTIVE  ?Blood pressure 116/65, pulse 97, temperature 97.9 ?F (36.6 ?C), resp. rate 20, height '5\' 2"'$  (1.575 m), weight 53.5 kg, last menstrual period 02/24/2018, SpO2 100 %. ?   ?Vent Mode: PRVC ?FiO2 (%):  [40 %-70 %] 40 % ?Set Rate:  [16 bmp] 16 bmp ?Vt Set:  [400 mL] 400 mL ?PEEP:  [5 cmH20] 5 cmH20  ? ?Intake/Output Summary (Last 24 hours) at 09/14/2021 0532 ?Last data filed at 09/14/2021 5916 ?Gross per 24 hour  ?Intake 2602.24 ml  ?Output 370 ml  ?Net 2232.24 ml  ? ?Filed Weights  ? 09/13/21 1817 09/14/21 0215  ?Weight: 52.6 kg 53.5 kg  ? ? ?Physical Examination  ?GENERAL:51 year-old critically ill patient lying in the bed intubated and sedated ?EYES: Pupils equal, round, reactive to light and accommodation. No scleral icterus. Extraocular muscles intact.  ?HEENT: Head atraumatic, normocephalic. Oropharynx and nasopharynx clear.  ?NECK:  Supple, no jugular venous distention. No thyroid enlargement, no tenderness.  ?LUNGS: Normal breath sounds bilaterally, no wheezing, rales,rhonchi or crepitation. No use of accessory muscles of respiration.  ?CARDIOVASCULAR: S1, S2 normal. No murmurs, rubs, or gallops.  ?ABDOMEN: Soft, nontender, nondistended. Bowel sounds present. No organomegaly or mass.  Midline incision intact. ?EXTREMITIES: No pedal edema, cyanosis, or clubbing.  Bilateral upper extremities flaccid ?NEUROLOGIC: Unable to assess due to patient is currently intubated and sedated with post intubation paralytics and induction  medication still on board. Gait not checked.  ?PSYCHIATRIC: The patient is intubated and sedated ?SKIN: No obvious rash, lesion, or ulcer.  ? ?Labs/imaging that I havepersonally reviewed  ?(right click and "Reselect all SmartList Selections" daily)  ? ?  ?Labs   ?CBC: ?Recent Labs  ?Lab 09/07/21 ?0905 09/13/21 ?1822 09/14/21 ?0051  ?WBC 13.6* 20.2* 32.2*  ?NEUTROABS  --  17.4* 29.2*  ?HGB 14.3 11.4* 10.8*  ?HCT 40.9 32.1* 31.2*  ?MCV 103.8* 100.0 102.6*  ?PLT 126* 147* 196  ? ? ?Basic Metabolic Panel: ?Recent Labs  ?Lab 09/07/21 ?0905 09/13/21 ?1233 09/13/21 ?1822 09/14/21 ?0051  ?NA 134* 128* 127* 130*  ?K 3.0* 4.0 3.6 3.3*  ?CL 93* 87* 87* 97*  ?CO2 '24 29 27 23  '$ ?GLUCOSE 150* 89 97 92  ?BUN 49* 87* 90* 77*  ?CREATININE 1.32* 2.99* 3.22* 2.50*  ?CALCIUM 9.3 9.0 9.3 7.8*  ?MG 1.7  --   --   --   ? ?GFR: ?Estimated Creatinine Clearance: 21.1 mL/min (A) (by C-G formula based on SCr of 2.5 mg/dL (H)). ?Recent Labs  ?Lab 09/07/21 ?0905 09/13/21 ?1822 09/14/21 ?0051 09/14/21 ?0446  ?WBC 13.6* 20.2* 32.2*  --   ?LATICACIDVEN  --   --  1.2 0.7  ? ? ?Liver Function Tests: ?Recent Labs  ?Lab 09/07/21 ?0905 09/13/21 ?1233 09/13/21 ?1822 09/14/21 ?0051  ?AST 42* '26 29 28  '$ ?ALT '25 14 15 14  '$ ?ALKPHOS 211* 320* 275* 263*  ?BILITOT 1.6* 3.5* 3.7* 3.2*  ?PROT 7.8 5.2* 6.6 5.8*  ?ALBUMIN 3.5 2.5* 2.2* 1.7*  ? ?Recent Labs  ?Lab  09/07/21 ?5427 09/13/21 ?1233 09/13/21 ?1956  ?LIPASE 19 30.0 33  ? ?Recent Labs  ?Lab 09/14/21 ?0446  ?AMMONIA 66*  ? ? ?ABG ?   ?Component Value Date/Time  ? PHART 7.3 (L) 09/14/2021 0446  ? PCO2ART 40 09/14/2021 0446  ? PO2ART 240 (H) 09/14/2021 0446  ? HCO3 19.7 (L) 09/14/2021 0446  ? ACIDBASEDEF 6.3 (H) 09/14/2021 0446  ? O2SAT 100 09/14/2021 0446  ?  ? ?Coagulation Profile: ?Recent Labs  ?Lab 09/14/21 ?0051  ?INR 1.6*  ? ? ?Cardiac Enzymes: ?No results for input(s): CKTOTAL, CKMB, CKMBINDEX, TROPONINI in the last 168 hours. ? ?HbA1C: ?Hgb A1c MFr Bld  ?Date/Time Value Ref Range Status  ?09/13/2021 12:33  PM 5.6 4.6 - 6.5 % Final  ?  Comment:  ?  Glycemic Control Guidelines for People with Diabetes:Non Diabetic:  <6%Goal of Therapy: <7%Additional Action Suggested:  >8%   ?09/28/2019 07:43 AM 5.0 4.6 -

## 2021-09-14 NOTE — Progress Notes (Signed)
UPDATE: ? ?Notified from PACU concerns for possible stroke.  Upon examination, pt noted to be unresponsive to external stimuli, posturing with bilateral arm flexion, intermittently.  Absent corneal reflex.  Vitals tachycardic and airway patent, maintaining sats and BP.  Code stroke called overhead and transported to CT, then to ICU while teleneuro consulted.  Warm handoff provided to ICU provider.  Husband updated of situation. ?

## 2021-09-14 NOTE — Progress Notes (Signed)
0024-code stroke activated, pt in CT ?0032-transported to ICU after CT ?0040-Dr. Kapuria joined teleneuro cart ?

## 2021-09-14 NOTE — Plan of Care (Signed)
  Problem: Clinical Measurements: Goal: Ability to maintain clinical measurements within normal limits will improve Outcome: Progressing Goal: Respiratory complications will improve Outcome: Progressing Goal: Cardiovascular complication will be avoided Outcome: Progressing   

## 2021-09-14 NOTE — Progress Notes (Signed)
Chaplain met pt's son in the hallway outside ICU and offered hospitality. Pt's mother and her grandson are now together at bedside. Continued care available per on call chaplain. ?

## 2021-09-14 NOTE — Transfer of Care (Addendum)
Immediate Anesthesia Transfer of Care Note ? ?Patient: Maria [name] ? ?Procedure(s) Performed: EXPLORATORY LAPAROTOMY-RAM PATCH repair (Abdomen) ? ?Patient Location: PACU ? ?Anesthesia Type:General ? ?Level of Consciousness: obtunded ? ?Airway & Oxygen Therapy: Patient connected to face mask oxygen ? ?Post-op Assessment: Report given to RN and Post -op Vital signs reviewed and stable ? ?Post vital signs: Reviewed and stable ? ?Last Vitals:  ?Vitals Value Taken Time  ?BP 146/128 09/14/21 0021  ?Temp 36.6 ?C 09/14/21 0004  ?Pulse 122 09/14/21 0029  ?Resp 18 09/14/21 0029  ?SpO2 99 % 09/14/21 0029  ?Vitals shown include unvalidated device data. ? ?Last Pain:  ?Vitals:  ? 09/13/21 2107  ?TempSrc:   ?PainSc: 6   ?   ? ?  ? ?Complications: post stroke protocol initiated ?

## 2021-09-14 NOTE — Consult Note (Signed)
[facility] ?[facility] Consult [facility] ? ? ?Patient Name:   Elie, Gragert ?Date of Birth:   [birth year] ?Identification Number:   MRN - [id number] ?Date of Service:   09/14/2021 00:28:00 ? ?Diagnosis: ?      G93.49 - Encephalopathy Multifactorial ? ?Impression: ?     51 yo F w hx of EtOH abuse admitted for emergent repair of perforated gastric ulcer who had persistent AMS after extubation and abnormal flexion of arms to chest bilaterally concerning for posturing. No hypotension or excessive blood loss during procedure that lasted 2-3 hours. Significant abdominal adhesions noted. CTH w/o shows no acute findings. Notably, she has an AKI to 3.1. Possible she has protracted encephalopathy from poor clearance of sedating medications vs other acute intracranial injury including CVA (basilar) or anoxic injury. ? ? Recommend: ? - CTA head/neck w contrast (even with AKI); deferring perfusion for now; will update with critical results ? - MRI brain w/o when able ? - if mentation not improving, stat 20 min EEG (or as soon as traditionally available) ? ? ? ? ? ? ? ?Recommendations: ? ?      Neuro Checks ?      Bedside Swallow Eval ?      DVT Prophylaxis ?      IV Fluids, Normal Saline ?      Head of Bed 30 Degrees ?      Euglycemia and Avoid Hyperthermia (PRN Acetaminophen) ? ?Routine Consultation with [facility] for Follow up Care ? ?Sign Out: ?      Discussed with Emergency Department Provider ? ? ? ?------------------------------------------------------------------------------ ? ?Advanced Imaging: ?Advanced Imaging Not Completed because: ? ?pending ? ? ?Metrics: ?Last Known Well: 09/13/2021 22:00:00 ?[facility] Notification Time: 09/14/2021 00:28:00 ?Stamp Time: 09/14/2021 00:28:00 ?Initial Response Time: 09/14/2021 00:36:04 ?Symptoms: ams, posturing. ?NIHSS Start Assessment Time: 09/14/2021 00:46:32 ?Patient is not a candidate for Thrombolytic. ?Thrombolytic Medical Decision: 09/14/2021  00:41:31 ?Patient was not deemed candidate for Thrombolytic because of following reasons: ?GI surgery today. ? ?CT head showed no acute hemorrhage or acute core infarct. ? ?Consulting physician Notified of Diagnostic Impression and Management Plan: 09/14/2021 00:54:39 ?Able to Reach ?09/14/2021 00:54:39 ? ? ? ?------------------------------------------------------------------------------ ? ?History of Present Illness: ?Patient is a 51 year old Female. ? ?Inpatient stroke alert was called for symptoms of ams, posturing. ?Patient has a hx of EtOH abuse and was admitted for pneuroperitoneum with perforated gastric ulcer requiring emergent surgery. She was well oriented without focal deficits prior to procedure which started at 2200. In post-op, she had difficulty arousing and was noted to be intermittently flexing her arms to her chest (not rhythmically) concerning for posturing. ?  ? ?Past Medical History: ?     There is no history of Stroke ? ?Medications: ? ?No Anticoagulant use  ?No Antiplatelet use ?Reviewed EMR for current medications ? ?Allergies:  ?Reviewed ? ?Social History: ?Alcohol Use: Yes ? ?Family History: ? ?There is no family history of premature cerebrovascular disease pertinent to this consultation ? ?ROS : ?14 Points Review of Systems was performed and was negative except mentioned in HPI. ? ?Past Surgical History: ?There Is No Surgical History Contributory To Today?s Visit ? ?NIHSS may not be reliable due to: Patient was intubated and paralyzed ? ?Examination: ?BP(174/69), Pulse(122), ?1A: Level of Consciousness - Postures or Unresponsive + 3 ?1B: Ask Month and Age - Could Not Answer Either Question Correctly + 2 ?1C: Blink Eyes & Squeeze Hands - Performs 0 Tasks + 2 ?2: Test Horizontal Extraocular  Movements - Normal + 0 ?3: Test Visual Fields - No Visual Loss + 0 ?4: Test Facial Palsy (Use Grimace if Obtunded) - Normal symmetry + 0 ?5A: Test Left Arm Motor Drift - No Movement + 4 ?5B: Test Right  Arm Motor Drift - No Movement + 4 ?6A: Test Left Leg Motor Drift - No Movement + 4 ?6B: Test Right Leg Motor Drift - No Movement + 4 ?7: Test Limb Ataxia (FNF/Heel-Shin) - No Ataxia + 0 ?8: Test Sensation - Normal; No sensory loss + 0 ?9: Test Language/Aphasia - Coma/Unresponsive + 3 ?10: Test Dysarthria - Intubated/Unable to Test + 0 ?11: Test Extinction/Inattention - No abnormality + 0 ? ?NIHSS Score: 26 ? ?NIHSS Free Text : She was intubated and sedated but just prior to eval but was noted to have midline gaze. Pupils or corneals not tested by this provider. ? ?Pre-Morbid Modified Rankin Scale: ?Unable to assess ? ? ?Patient/Family was informed the Neurology Consult would occur via TeleHealth consult by way of interactive audio and video telecommunications and consented to receiving care in this manner. ? ? ?Patient is being evaluated for possible acute neurologic impairment and high probability of imminent or life-threatening deterioration. I spent total of 30 minutes providing care to this patient, including time for face to face visit via telemedicine, review of medical records, imaging studies and discussion of findings with providers, the patient and/or family. ? ? ?Dr Apolinar Junes ? ? ?[facility] ?[phone] ? ? ?Case [id number] ?  ?

## 2021-09-14 NOTE — Consult Note (Signed)
PHARMACY CONSULT NOTE ? ?Pharmacy Consult for Electrolyte Monitoring and Replacement  ? ?Recent Labs: ?Potassium (mmol/L)  ?Date Value  ?09/14/2021 3.3 (L)  ?04/26/2014 2.7 (L)  ? ?Magnesium (mg/dL)  ?Date Value  ?09/07/2021 1.7  ? ?Calcium (mg/dL)  ?Date Value  ?09/14/2021 7.8 (L)  ? ?Calcium, Total (mg/dL)  ?Date Value  ?04/26/2014 9.9  ? ?Albumin (g/dL)  ?Date Value  ?09/14/2021 1.7 (L)  ?04/26/2014 3.9  ? ?Sodium (mmol/L)  ?Date Value  ?09/14/2021 130 (L)  ?04/26/2014 135 (L)  ? ?Assessment: ?Patient is a 51 y/o F with medical history including anxiety / depression, GERD, gastritis who is admitted with perforated peptic ulcer. Patient is s/p ExLap with Phillip Heal patch repair of perforated ulcer and intra-abdominal abscess drainage on 3/15. Pharmacy consulted to assist with electrolyte monitoring and replacement as indicated. ? ?Diet: NPO ?MIVF: NS at 100 cc/hr ? ?Scr 3.22 >> 2.5 ? ?Goal of Therapy:  ?Electrolytes within normal limits ? ?Plan:  ?--Na 127 >> 130. Improving with MIVF ?--K 3.3, IV Kcl 10 mEq x 2 ?--Follow-up electrolytes with AM labs tomorrow ? ?Benita Gutter ?09/14/2021 1:26 PM  ?

## 2021-09-14 NOTE — Progress Notes (Incomplete)
{  CH[facility] IP ALL NUTRITION NOTES:[id number]} 

## 2021-09-14 NOTE — Progress Notes (Signed)
eLink Physician-Brief Progress Note ?Patient Name: Maria [name] ?DOB: 04-Jan-1971 ?MRN: [id number] ? ? ?Date of Service ? 09/14/2021  ?HPI/Events of Note ? Patient presented to the ED with perforated gastric ulcer and is s/p exploratory laparotomy with repair, post-operatively after extubation she was noted to be unresponsive and appeared to be posturing, she had emergent CT head which was unremarkable, and further work up is in progress, she was re-intubated on arriving in the ICU for airway protection. Neurology is involved.   ?eICU Interventions ? New Patient Evaluation.  ? ? ? ?  ? ?Frederik Pear ?09/14/2021, 2:21 AM ?

## 2021-09-14 NOTE — Procedures (Addendum)
ARTERIAL CATHETER INSERTION PROCEDURE NOTE ? ?Maria [name]  ?[id number]  ?Jun 01, 1971 ? ?Date:09/14/21  ?Time:2:25 AM  ? ?Provider Performing: Karen Kays DNP ? ?Procedure: Insertion of Arterial Line [phone]) with US guidance ([id number])  ? ?Indication(s): Blood pressure monitoring and/or need for frequent ABGs ? ?Consent: Unable to obtain consent due to emergent nature of procedure. ? ?Anesthesia: None ? ?Time Out: Verified patient identification, verified procedure, site/side was marked, verified correct patient position, special equipment/implants available, medications/allergies/relevant history reviewed, required imaging and test results available. ? ?Sterile Technique: Maximal sterile technique including full sterile barrier drape, hand hygiene, sterile gown, sterile gloves, mask, hair covering, sterile ultrasound probe cover (if used). ? ?Procedure Description: Area of catheter insertion was cleaned with chlorhexidine and draped in sterile fashion. With real-time ultrasound guidance an arterial catheter was placed into the left radial artery.  Appropriate arterial tracings confirmed on monitor.   ? ?Complications/Tolerance: None; patient tolerated the procedure well. ? ?EBL: Minimal ? ?Specimen(s): None ? ? ?[name], DNP, CCRN, FNP-C, AGACNP-BC ?Acute Care Nurse Practitioner  ?[facility] ?Pager: [phone] ?[place] [facility] ? ?

## 2021-09-14 NOTE — Plan of Care (Signed)

## 2021-09-15 DIAGNOSIS — E440 Moderate protein-calorie malnutrition: Secondary | ICD-10-CM | POA: Diagnosis present

## 2021-09-15 DIAGNOSIS — K275 Chronic or unspecified peptic ulcer, site unspecified, with perforation: Secondary | ICD-10-CM | POA: Diagnosis not present

## 2021-09-15 LAB — PHOSPHORUS: Phosphorus: 9.8 mg/dL — ABNORMAL HIGH (ref 2.5–4.6)

## 2021-09-15 LAB — URINE CULTURE: Culture: NO GROWTH

## 2021-09-15 LAB — HEMOGLOBIN AND HEMATOCRIT, BLOOD
HCT: 25.7 % — ABNORMAL LOW (ref 36.0–46.0)
Hemoglobin: 8.7 g/dL — ABNORMAL LOW (ref 12.0–15.0)

## 2021-09-15 LAB — CBC
HCT: 24 % — ABNORMAL LOW (ref 36.0–46.0)
Hemoglobin: 8.3 g/dL — ABNORMAL LOW (ref 12.0–15.0)
MCH: 35.6 pg — ABNORMAL HIGH (ref 26.0–34.0)
MCHC: 34.6 g/dL (ref 30.0–36.0)
MCV: 103 fL — ABNORMAL HIGH (ref 80.0–100.0)
Platelets: 125 10*3/uL — ABNORMAL LOW (ref 150–400)
RBC: 2.33 MIL/uL — ABNORMAL LOW (ref 3.87–5.11)
RDW: 12.3 % (ref 11.5–15.5)
WBC: 15.4 10*3/uL — ABNORMAL HIGH (ref 4.0–10.5)
nRBC: 0 % (ref 0.0–0.2)

## 2021-09-15 LAB — BASIC METABOLIC PANEL
Anion gap: 14 (ref 5–15)
BUN: 84 mg/dL — ABNORMAL HIGH (ref 6–20)
CO2: 18 mmol/L — ABNORMAL LOW (ref 22–32)
Calcium: 7.9 mg/dL — ABNORMAL LOW (ref 8.9–10.3)
Chloride: 104 mmol/L (ref 98–111)
Creatinine, Ser: 2.69 mg/dL — ABNORMAL HIGH (ref 0.44–1.00)
GFR, Estimated: 21 mL/min — ABNORMAL LOW (ref >60)
Glucose, Bld: 103 mg/dL — ABNORMAL HIGH (ref 70–99)
Potassium: 3.9 mmol/L (ref 3.5–5.1)
Sodium: 136 mmol/L (ref 135–145)

## 2021-09-15 LAB — MAGNESIUM: Magnesium: 2.3 mg/dL (ref 1.7–2.4)

## 2021-09-15 LAB — GLUCOSE, CAPILLARY
Glucose-Capillary: 91 mg/dL (ref 70–99)
Glucose-Capillary: 96 mg/dL (ref 70–99)
Glucose-Capillary: 91 mg/dL (ref 70–99)
Glucose-Capillary: 93 mg/dL (ref 70–99)
Glucose-Capillary: 90 mg/dL (ref 70–99)

## 2021-09-15 MED ORDER — BETHANECHOL CHLORIDE 10 MG PO TABS
10.0000 mg | ORAL_TABLET | Freq: Three times a day (TID) | ORAL | Status: DC
  Filled 2021-09-15 (×3): qty 1

## 2021-09-15 MED ORDER — LACTATED RINGERS IV SOLN: INTRAVENOUS | Status: DC

## 2021-09-15 NOTE — Progress Notes (Signed)
Mentation is improved this shift but drowsiness continues.  Pt is oriented x4, denies pain, or any symptom.  Foley catheter removed at 1400 due to increased mentation, and purewick placed.  Family has been visiting frequently. ?

## 2021-09-15 NOTE — Progress Notes (Addendum)
? ? ?NAME:  Maria [name], MRN:  [id number], DOB:  1970-11-15, LOS: 1 ?ADMISSION DATE:  09/13/2021, CONSULTATION DATE:  09/14/2021 ?REFERRING MD: [name], DO CHIEF COMPLAINT:  abdominal pain  ? ? ?HPI  ?51 y.o with significant PMH of EtOH abuse, chronic alcoholic pancreatitis, anemia, anxiety and depression, hypertension, and GERD who presented to the ED with chief complaints of progressive abdominal pain worse in the epigastric region  associated with nausea vomiting, generalized weakness and fatigue x 1 week. ? ?Patient was previously seen in the ED on 09/07/2021 with similar complaints.  Work-up at that time did not reveal any acute finding.  Patient had a CT abdomen pelvis which showed possible gastritis otherwise no significant findings.  Symptoms improved and patient was discharged with return to ED for worsening symptoms.  Patient followed up with her PCP yesterday for continued abdominal pain and generalized weakness.  She had labs drawn and was noted to have abnormal lab finding of BUN/creatinine 87/2.99 therefore was sent to the ED for further evaluation. ? ?ED Course: In the emergency department, the temperature was 97.3 (36.3?C), the heart rate  83 beats/minute, the blood pressure  mm Hg, the respiratory rate 17 breaths/minute, and the oxygen saturation 99 % on RA.  Labs revealed : Na+/, K+: 127/3.6, Glucose: 97, BUN/Cr.:90/3.22, WBC/ TMAX: 20.2/ afebrile, Hgb/Hct: 11.4/32.1, Plts:147, COVID PCR: NegativeCT abdomen pelvis was obtained and showed perforated ulcers of the gastric antrum anteriorly, with directly visualized collection of gas in the stomach with free intraperitoneal gas and new suspected abscess along the omentum.  Findings were discussed with on-call general surgery who recommended emergent ex lap.  Patient started on empiric IV Zosyn following IV fluid resuscitation and was taken to the OR. ? ?Patient underwent exploratory lap with primary closure of perforated peptic ulcers and drainage  of intra-abdominal abscess.  Patient successfully extubated and taken to PACU postop.  While recovering in the PACU, patient had a change in neuro status with abnormal flexion of arms to chest bilaterally concerning for posturing and possible ischemic event.  Due to concerns for possible encephalopathy from poor clearance of sedating medication versus acute intracranial abnormality versus anoxic injury, code stroke was initiated.  Initial NIH stroke scale 26.  CT head was obtained which showed no acute intracranial abnormality.  Patient evaluated by telemetry neurologist who recommended further evaluation with CTA head and neck.  Patient was transported to the ICU and PCCM consulted to assist with management.  On arrival to the ICU, patient was noted to be obtunded, unresponsive with increased work of breathing on NRB requiring emergent reintubation for airway protection. ? ?Past Medical History  ?EtOH abuse, chronic alcoholic pancreatitis, anemia, anxiety and depression, hypertension, and GERD  ? ?Significant Hospital Events   ?3/15: Presenting with abdominal pain found to have perforated gastric ulcer s/p repair ?3/16: Acute change in mental status postop with abnormal flexion of stick chest bilaterally concerning for posturing.  Patient evaluated by teleneurologist stat CT head negative.  Transferred to the ICU and emergently intubated.  PCCM consulted ? ?Consults:  ?PCCM ?Neurology ?Nephrology (3/17) ? ?Procedures:  ?3/15: exploratory laparotomy primary closure of perforated peptic ulcer Graham patch repair, intrabdominal abscess drainage,  gastric biopsy ?3/16: Left radial arterial line ?3/16: Left IJ placement ?3/16: Endotracheal intubation ? ?Significant Diagnostic Tests:  ?3/15: CT abdomen pelvis> perforated ulcer of the gastric antrum anteriorly, with directly visualized connection of gas in the stomach with free ?intraperitoneal gas on image 41 series 2. New suspected abscess along  the omentum, image 51  series 2. Abscess or contained gas and fluid posterior to the lateral segment left hepatic lobe on image 19 series 2. ?3/16: Noncontrast CT head> no acute intracranial abnormality ?3/16: MRI brain> no acute intracranial abnormality ?3/16: MRA head and neck> no acute intracranial abnormality ?3/16: CTA chest/ abdomen and pelvis> postoperative day 1 appearance of the abdomen.  Patch repair, decreased pneumoperitoneum, no bowel obstruction, chronic calcific pancreatitis, no PE ?3/17: Extubated 3/16 8:54 AM, no sequela.  Acute kidney injury, consulted nephrology ? ?Micro Data:  ?3/15: SARS-CoV-2 PCR> negative ?3/15: Influenza PCR> negative ?3/15: Blood culture x2> ?3/15: Urine Culture> ?3/15: MRSA PCR>>  ? ?Antimicrobials:  ?Zosyn 3/15>> ? ?OBJECTIVE  ?Blood pressure 106/65, pulse 70, temperature 97.7 ?F (36.5 ?C), temperature source Oral, resp. rate (!) 25, height '5\' 2"'$  (1.575 m), weight 55.1 kg, last menstrual period 02/24/2018, SpO2 96 %. ?   ?   ? ?Intake/Output Summary (Last 24 hours) at 09/15/2021 0843 ?Last data filed at 09/15/2021 0609 ?Gross per 24 hour  ?Intake 3163.82 ml  ?Output 715 ml  ?Net 2448.82 ml  ? ? ?Filed Weights  ? 09/13/21 1817 09/14/21 0215 09/15/21 0351  ?Weight: 52.6 kg 53.5 kg 55.1 kg  ? ? ?Physical Examination  ?GENERAL: Chronically ill-appearing woman, appears older than stated age, sitting in bed, sleepy but easily arousable.  Responds appropriately to questions. ?EYES: Pupils equal, round, reactive to light and accommodation. No scleral icterus.  Oral mucosa moist.HEENT: Head atraumatic, normocephalic. Oropharynx and nasopharynx clear.  ?NECK:  Supple, no jugular venous distention. No thyroid enlargement, no tenderness.  ?LUNGS: Normal breath sounds bilaterally, no wheezing, rales,rhonchi or crepitation. No use of accessory muscles of respiration.  ?CARDIOVASCULAR: S1, S2 normal. No murmurs, rubs, or gallops.  ?ABDOMEN: Soft, nontender, nondistended. Bowel sounds present. No organomegaly or  mass.  Midline incision intact. ?EXTREMITIES: No pedal edema, cyanosis, or clubbing. ?NEUROLOGIC: Sleepy but easily arousable, no overt focal deficit. ?PSYCHIATRIC: Appears appropriate. ?SKIN: No obvious rash, lesion, or ulcer.  ? ? ?  ?Labs   ?CBC: ?Recent Labs  ?Lab 09/13/21 ?1822 09/14/21 ?0051 09/15/21 ?0343  ?WBC 20.2* 32.2* 15.4*  ?NEUTROABS 17.4* 29.2*  --   ?HGB 11.4* 10.8* 8.3*  ?HCT 32.1* 31.2* 24.0*  ?MCV 100.0 102.6* 103.0*  ?PLT 147* 196 125*  ? ? ? ?Basic Metabolic Panel: ?Recent Labs  ?Lab 09/13/21 ?1233 09/13/21 ?1822 09/14/21 ?0051 09/15/21 ?0343  ?NA 128* 127* 130* 136  ?K 4.0 3.6 3.3* 3.9  ?CL 87* 87* 97* 104  ?CO2 '29 27 23 '$ 18*  ?GLUCOSE 89 97 92 103*  ?BUN 87* 90* 77* 84*  ?CREATININE 2.99* 3.22* 2.50* 2.69*  ?CALCIUM 9.0 9.3 7.8* 7.9*  ?MG  --   --   --  2.3  ?PHOS  --   --   --  9.8*  ? ? ?GFR: ?Estimated Creatinine Clearance: 19.6 mL/min (A) (by C-G formula based on SCr of 2.69 mg/dL (H)). ?Recent Labs  ?Lab 09/13/21 ?1822 09/14/21 ?0051 09/14/21 ?0446 09/15/21 ?0343  ?WBC 20.2* 32.2*  --  15.4*  ?LATICACIDVEN  --  1.2 0.7  --   ? ? ? ?Liver Function Tests: ?Recent Labs  ?Lab 09/13/21 ?1233 09/13/21 ?1822 09/14/21 ?0051  ?AST '26 29 28  '$ ?ALT '14 15 14  '$ ?ALKPHOS 320* 275* 263*  ?BILITOT 3.5* 3.7* 3.2*  ?PROT 5.2* 6.6 5.8*  ?ALBUMIN 2.5* 2.2* 1.7*  ? ? ?Recent Labs  ?Lab 09/13/21 ?1233 09/13/21 ?1956  ?LIPASE 30.0 33  ? ? ?  Recent Labs  ?Lab 09/14/21 ?0446  ?AMMONIA 66*  ? ? ? ?ABG ?   ?Component Value Date/Time  ? PHART 7.3 (L) 09/14/2021 0446  ? PCO2ART 40 09/14/2021 0446  ? PO2ART 240 (H) 09/14/2021 0446  ? HCO3 19.7 (L) 09/14/2021 0446  ? ACIDBASEDEF 6.3 (H) 09/14/2021 0446  ? O2SAT 100 09/14/2021 0446  ? ?  ? ?Coagulation Profile: ?Recent Labs  ?Lab 09/14/21 ?0051  ?INR 1.6*  ? ? ? ?Cardiac Enzymes: ?No results for input(s): CKTOTAL, CKMB, CKMBINDEX, TROPONINI in the last 168 hours. ? ?HbA1C: ?Hgb A1c MFr Bld  ?Date/Time Value Ref Range Status  ?09/13/2021 12:33 PM 5.6 4.6 - 6.5 % Final  ?   Comment:  ?  Glycemic Control Guidelines for People with Diabetes:Non Diabetic:  <6%Goal of Therapy: <7%Additional Action Suggested:  >8%   ?09/28/2019 07:43 AM 5.0 4.6 - 6.5 % Final  ?  Comment:  ?  [place]ce

## 2021-09-15 NOTE — Consult Note (Signed)
?[facility]  ?CONSULT NOTE  ? ? ?Date: 09/15/2021      ?      ?      ?Patient Name:  Maria [name]  MRN: [id number]  ?DOB: October 02, 1970  Age / Sex: 51 y.o., female   ?      ?PCP: [name], NP     ?      ?      ?Service Requesting Consult: TRH     ?      ?      ?Reason for Consult: Acute kidney injury     ?      ? ?History of Present Illness: ?Ms. Lamara Montel is a 51 y.o.  female with anemia, depression and anxiety, gastritis, GERD, hypertension, and pancreatitis, who was admitted to [facility] on 09/13/2021 for Perforated peptic ulcer ([place]) [K27.5] ? ?Patient presents to the emergency room with complaints of lower back pain that began several days prior.  Patiently recently was seen in the emergency department last week for same complaint.  Patient describes pain as a sharp progressive pain over her midsternal region.  Does report poor appetite, nausea and vomiting but denies diarrhea.  Patient states she was taking multiple medications for pain including NSAIDs, BC powders. ? ?Labs on ED arrival include sodium 128, BUN 87, creatinine 2.99 with GFR 17.  Respiratory panel negative for influenza and COVID-19.  CT abdomen with contrast shows perforated ulcer and gastric antrum with urgent surgical consult recommended.  CT angio chest with contrast negative for acute findings.  Angio neck with/without contrast negative for acute findings. ? ? ?Medications: ?Outpatient medications: ?Medications Prior to Admission  ?Medication Sig Dispense Refill Last Dose  ? meloxicam (MOBIC) 15 MG tablet Take 15 mg by mouth daily.     ? omeprazole (PRILOSEC) 20 MG capsule Take 1 capsule (20 mg total) by mouth daily. 30 capsule 0 Past Week  ? ondansetron (ZOFRAN) 4 MG tablet Take 1 tablet (4 mg total) by mouth every 8 (eight) hours as needed for nausea or vomiting. 20 tablet 0 Past Week  ? cyanocobalamin ([employer] VITAMIN B12) 2000 MCG tablet Take 1 tablet (2,000 mcg total) by mouth daily. (Patient not taking:  Reported on 09/14/2021)   Not Taking  ? dicyclomine (BENTYL) 10 MG capsule Take 1 capsule (10 mg total) by mouth 4 (four) times daily -  before meals and at bedtime for 3 days. 12 capsule 0   ? EPINEPHrine 0.3 mg/0.3 mL IJ SOAJ injection Inject 0.3 mLs (0.3 mg total) into the muscle as needed for anaphylaxis. 1 Device 0 prn  ? fluticasone (FLONASE) 50 MCG/ACT nasal spray Place 2 sprays into both nostrils daily. (Patient not taking: Reported on 09/14/2021) 16 g 1 Not Taking  ? folic acid (FOLVITE) 1 MG tablet Take 1 tablet (1 mg total) by mouth daily. (Patient not taking: Reported on 09/14/2021)   Not Taking  ? hydrOXYzine (ATARAX/VISTARIL) 10 MG tablet Take 1-2 tablets (10-20 mg total) by mouth 3 (three) times daily as needed for itching. (Patient not taking: Reported on 09/14/2021) 20 tablet 0 Not Taking  ? Multiple Vitamin (MULTIVITAMIN) tablet Take 1 tablet by mouth daily. (Patient not taking: Reported on 09/14/2021)   Not Taking  ? thiamine (VITAMIN B-1) 50 MG tablet Take 1 tablet (50 mg total) by mouth daily. (Patient not taking: Reported on 09/14/2021)   Not Taking  ? ? ?Current medications: ?Current Facility-Administered Medications  ?Medication Dose Route Frequency Provider Last Rate  Last Admin  ? 0.9 %  sodium chloride infusion   Intravenous PRN Lysle Pearl, Isami, DO 10 mL/hr at 09/14/21 2235 New Bag at 09/14/21 2235  ? Chlorhexidine Gluconate Cloth 2 % PADS 6 each  6 each Topical Daily Waynoka, Isami, DO   6 each at 09/15/21 1054  ? docusate (COLACE) 50 MG/5ML liquid 100 mg  100 mg Per Tube BID Lang Snow, NP   100 mg at 09/15/21 1048  ? enoxaparin (LOVENOX) injection 30 mg  30 mg Subcutaneous QHS Flora Lipps, MD   30 mg at 09/14/21 2236  ? fentaNYL (SUBLIMAZE) injection 50 mcg  50 mcg Intravenous Q4H PRN Lang Snow, NP   50 mcg at 09/14/21 1943  ? fluconazole (DIFLUCAN) IVPB 200 mg  200 mg Intravenous Q24H Benita Gutter, RPH 100 mL/hr at 09/15/21 1051 200 mg at 09/15/21 1051  ? folic acid  injection 1 mg  1 mg Intravenous Daily Flora Lipps, MD   1 mg at 09/15/21 1051  ? lactated ringers infusion   Intravenous Continuous Darel Hong D, NP 100 mL/hr at 09/15/21 1023 New Bag at 09/15/21 1023  ? LORazepam (ATIVAN) tablet 1-4 mg  1-4 mg Per Tube Q1H PRN [name], DO      ? Or  ? LORazepam (ATIVAN) injection 1-4 mg  1-4 mg Intravenous Q1H PRN Lysle Pearl, Isami, DO      ? ondansetron (ZOFRAN) tablet 4 mg  4 mg Per Tube Q6H PRN [name], DO      ? Or  ? ondansetron (ZOFRAN) injection 4 mg  4 mg Intravenous Q6H PRN Sakai, Isami, DO      ? pantoprazole (PROTONIX) injection 40 mg  40 mg Intravenous Q12H Flora Lipps, MD   40 mg at 09/15/21 1037  ? piperacillin-tazobactam (ZOSYN) IVPB 3.375 g  3.375 g Intravenous Q8H Lockie Mola B, RPH 12.5 mL/hr at 09/15/21 0549 3.375 g at 09/15/21 0549  ? polyethylene glycol (MIRALAX / GLYCOLAX) packet 17 g  17 g Per Tube Daily Lang Snow, NP   17 g at 09/15/21 1047  ? thiamine '500mg'$  in normal saline (3m) IVPB  500 mg Intravenous Daily KFlora Lipps MD 100 mL/hr at 09/15/21 1049 500 mg at 09/15/21 1049  ? Followed by  ? [START ON 09/17/2021] thiamine tablet 100 mg  100 mg Oral Daily KFlora Lipps MD      ?  ? ? ?Allergies: ?Allergies  ?Allergen Reactions  ? Anesthesia S-I-40 [Propofol]   ? Bee Venom Swelling  ? Codeine Swelling  ? Bupropion Anxiety and Other (See Comments)  ?  Worsened anxiety  ?  ? ? ?Past Medical History: ?Past Medical History:  ?Diagnosis Date  ? Anemia   ? Anesthesia complication, initial encounter 09/13/2021  ? Difficulty waking up from Anesthesia requiring re-intubation  ? Anxiety   ? h/o  ? Depression   ? h/o  ? Family history of adverse reaction to anesthesia   ? mom-hard time waking up  ? Gastritis 08/19/2020  ? GERD (gastroesophageal reflux disease)   ? tums prn  ? Hypertension   ? WAS PUT ON BP MED BY PCP LAST YEAR (2018) AND BP MED MADE PT SICK SO SHE STOPPED TAKING IT-PCP MONITORS BP NOW  ? Palpitations 05/18/2020  ?  Pancreatitis   ? Poison ivy dermatitis 11/13/2019  ? ? ? ?Past Surgical History: ?Past Surgical History:  ?Procedure Laterality Date  ? ANTERIOR AND POSTERIOR REPAIR WITH SACROSPINOUS FIXATION N/A 03/13/2018  ?  Procedure: ANTERIOR REPAIR;  Surgeon: [name], MD;  Location: [facility] ORS;  Service: Gynecology;  Laterality: N/A;  ? INDUCED ABORTION    ? x2  ? LAPAROTOMY N/A 09/13/2021  ? Procedure: EXPLORATORY LAPAROTOMY-Graham PATCH repair;  Surgeon: [name], DO;  Location: [facility] ORS;  Service: General;  Laterality: N/A;  ? VAGINAL HYSTERECTOMY N/A 03/13/2018  ? Procedure: HYSTERECTOMY VAGINAL;  Surgeon: [name], MD;  Location: [facility] ORS;  Service: Gynecology;  Laterality: N/A;  ? ? ? ?Family History: ?Family History  ?Problem Relation Age of Onset  ? Arthritis Mother   ? Arthritis Father   ? Alcohol abuse Father   ? Hypertension Father   ? Heart disease Father   ? Aneurysm Father   ? Breast cancer Maternal Grandmother   ? Breast cancer Paternal Grandmother   ? ? ? ?Social History: ?Social History  ? ?Socioeconomic History  ? Marital status: Married  ?  Spouse name: Not on file  ? Number of children: Not on file  ? Years of education: Not on file  ? Highest education level: Not on file  ?Occupational History  ? Not on file  ?Tobacco Use  ? Smoking status: Every Day  ?  Packs/day: 1.00  ?  Years: 20.00  ?  Pack years: 20.00  ?  Types: Cigarettes  ? Smokeless tobacco: Never  ?Vaping Use  ? Vaping Use: Never used  ?Substance and Sexual Activity  ? Alcohol use: Yes  ?  Alcohol/week: 2.0 standard drinks  ?  Types: 2 Glasses of wine per week  ?  Comment: two glasses wine/beer every other day  ? Drug use: No  ? Sexual activity: Yes  ?Other Topics Concern  ? Not on file  ?Social History Narrative  ? Married.  ? 1 child.   ? Working at [employer] at [employer].    ? Enjoys swimming, camping  ? ?Social Determinants of Health  ? ?Financial Resource Strain: Not on file  ?Food Insecurity: Not on file  ?Transportation Needs: Not on  file  ?Physical Activity: Not on file  ?Stress: Not on file  ?Social Connections: Not on file  ?Intimate Partner Violence: Not on file  ? ? ? ?Review of Systems: ?Review of Systems  ?Constitutional:  Negative fo

## 2021-09-15 NOTE — Plan of Care (Signed)

## 2021-09-15 NOTE — Consult Note (Signed)
Pharmacy Antibiotic Note ? ?Maria [name] is a 51 y.o. female with medical history including anxiety / depression, GERD / gastritis, EtOH use disorder admitted on 09/13/2021 with  perforated peptic ulcer . Patient is s/p ExLap with Phillip Heal patch repair of perforated ulcer and intra-abdominal abscess drainage on 3/15.  Pharmacy has been consulted for Zosyn dosing. Patient is also ordered fluconazole. ? ?Scr 3.22 >> 2.5 >> 2.69. Nephrology has been consulted. Patient currently receiving fluids. ? ?Plan: ? ?Continue Zosyn 3.375 g IV q8h (4-hr infusion) ? ?Patient is borderline renal dose adjustment. CrCl in chart 19.6 mL/min. Normalized CrCl 28 mL/min. Will maintain current dosing for now and continue to follow UOP / Scr trend for consideration of further dose adjustments ? ?Height: '5\' 2"'$  (157.5 cm) ?Weight: 55.1 kg (121 lb 7.6 oz) ?IBW/kg (Calculated) : 50.1 ? ?Temp (24hrs), Avg:97.6 ?F (36.4 ?C), Min:97.4 ?F (36.3 ?C), Max:97.8 ?F (36.6 ?C) ? ?Recent Labs  ?Lab 09/13/21 ?1233 09/13/21 ?1822 09/14/21 ?0051 09/14/21 ?7672 09/15/21 ?0343  ?WBC  --  20.2* 32.2*  --  15.4*  ?CREATININE 2.99* 3.22* 2.50*  --  2.69*  ?LATICACIDVEN  --   --  1.2 0.7  --   ?  ?Estimated Creatinine Clearance: 19.6 mL/min (A) (by C-G formula based on SCr of 2.69 mg/dL (H)).   ? ?Allergies  ?Allergen Reactions  ? Anesthesia S-I-40 [Propofol]   ? Bee Venom Swelling  ? Codeine Swelling  ? Bupropion Anxiety and Other (See Comments)  ?  Worsened anxiety  ? ? ?Antimicrobials this admission: ?Zosyn 3/15 >>  ?Diflucan 3/16 >>  ? ?Dose adjustments this admission: ?N/A ? ?Microbiology results: ?3/15 BCx: NGTD ?3/16 UCx: NG  ?3/16 MRSA PCR: (-) ? ?Thank you for allowing pharmacy to be a part of this patient?s care. ? ?Benita Gutter ?09/15/2021 10:38 AM ? ?

## 2021-09-15 NOTE — Consult Note (Signed)
PHARMACY CONSULT NOTE ? ?Pharmacy Consult for Electrolyte Monitoring and Replacement  ? ?Recent Labs: ?Potassium (mmol/L)  ?Date Value  ?09/15/2021 3.9  ?04/26/2014 2.7 (L)  ? ?Magnesium (mg/dL)  ?Date Value  ?09/15/2021 2.3  ? ?Calcium (mg/dL)  ?Date Value  ?09/15/2021 7.9 (L)  ? ?Calcium, Total (mg/dL)  ?Date Value  ?04/26/2014 9.9  ? ?Albumin (g/dL)  ?Date Value  ?09/14/2021 1.7 (L)  ?04/26/2014 3.9  ? ?Phosphorus (mg/dL)  ?Date Value  ?09/15/2021 9.8 (H)  ? ?Sodium (mmol/L)  ?Date Value  ?09/15/2021 136  ?04/26/2014 135 (L)  ? ?Assessment: ?Patient is a 51 y/o F with medical history including anxiety / depression, GERD, gastritis who is admitted with perforated peptic ulcer. Patient is s/p ExLap with Phillip Heal patch repair of perforated ulcer and intra-abdominal abscess drainage on 3/15. Pharmacy consulted to assist with electrolyte monitoring and replacement as indicated. ? ?Diet: NPO ?MIVF: NS at 100 cc/hr ? ?Scr 3.22 >> 2.5 >> 2.69 ? ?Goal of Therapy:  ?Electrolytes within normal limits ? ?Plan:  ?--Persistent renal dysfunction noted, Co2 18; consider changing MIVF to bicarb gtt / LR ?--Hyponatremia resolved with MIVF ?--Hyperphosphatemia, continue to monitor ?--Follow-up electrolytes with AM labs tomorrow ? ?Benita Gutter ?09/15/2021 7:30 AM  ?

## 2021-09-15 NOTE — Progress Notes (Signed)
Subjective:  ?CC: ?Maria [name] is a 51 y.o. female  Hospital stay day 1, 2 Days Post-Op perforated gastric ulcer ? ?HPI: ?No acute issues overnight.  Complains of some soreness in the abdomen.  No flatus or BM reported.  ? ? ?ROS:  ?General: Denies weight loss, weight gain, fatigue, fevers, chills, and night sweats. ?Heart: Denies chest pain, palpitations, racing heart, irregular heartbeat, leg pain or swelling, and decreased activity tolerance. ?Respiratory: Denies breathing difficulty, shortness of breath, wheezing, cough, and sputum. ?GI: Denies change in appetite, heartburn, nausea, vomiting, constipation, diarrhea, and blood in stool. ?GU: Denies difficulty urinating, pain with urinating, urgency, frequency, blood in urine. ? ? ?Objective:  ? ?Temp:  [97.4 ?F (36.3 ?C)-97.7 ?F (36.5 ?C)] 97.6 ?F (36.4 ?C) (03/17 1140) ?Pulse Rate:  [59-84] 63 (03/17 1300) ?Resp:  [10-25] 14 (03/17 1300) ?BP: (97-141)/(46-77) 130/71 (03/17 1300) ?SpO2:  [94 %-100 %] 100 % (03/17 1300) ?Weight:  [55.1 kg] 55.1 kg (03/17 0351)     Height: '5\' 2"'$  (157.5 cm) Weight: 55.1 kg BMI (Calculated): 22.21  ? ?Intake/Output this shift:  ? ?Intake/Output Summary (Last 24 hours) at 09/15/2021 1406 ?Last data filed at 09/15/2021 1200 ?Gross per 24 hour  ?Intake 2112.53 ml  ?Output 690 ml  ?Net 1422.53 ml  ? ? ?Constitutional :  alert, cooperative, appears stated age, and no distress  ?Respiratory:  clear to auscultation bilaterally  ?Cardiovascular:  regular rate and rhythm  ?Gastrointestinal: Soft, no guarding, TTP in epigastric region as expected .   ?Skin: Cool and moist. Staples c/d/i  ?Psychiatric: Normal affect, non-agitated, not confused  ?   ?  ?LABS:  ?CMP Latest Ref Rng & Units 09/15/2021 09/14/2021 09/13/2021  ?Glucose 70 - 99 mg/dL 103(H) 92 97  ?BUN 6 - 20 mg/dL 84(H) 77(H) 90(H)  ?Creatinine 0.44 - 1.00 mg/dL 2.69(H) 2.50(H) 3.22(H)  ?Sodium 135 - 145 mmol/L 136 130(L) 127(L)  ?Potassium 3.5 - 5.1 mmol/L 3.9 3.3(L) 3.6  ?Chloride 98 -  111 mmol/L 104 97(L) 87(L)  ?CO2 22 - 32 mmol/L 18(L) 23 27  ?Calcium 8.9 - 10.3 mg/dL 7.9(L) 7.8(L) 9.3  ?Total Protein 6.5 - 8.1 g/dL - 5.8(L) 6.6  ?Total Bilirubin 0.3 - 1.2 mg/dL - 3.2(H) 3.7(H)  ?Alkaline Phos 38 - 126 U/L - 263(H) 275(H)  ?AST 15 - 41 U/L - 28 29  ?ALT 0 - 44 U/L - 14 15  ? ?CBC Latest Ref Rng & Units 09/15/2021 09/15/2021 09/14/2021  ?WBC 4.0 - 10.5 K/uL - 15.4(H) 32.2(H)  ?Hemoglobin 12.0 - 15.0 g/dL 8.7(L) 8.3(L) 10.8(L)  ?Hematocrit 36.0 - 46.0 % 25.7(L) 24.0(L) 31.2(L)  ?Platelets 150 - 400 K/uL - 125(L) 196  ? ? ?RADS: ?N/a ?Assessment:  ? ?Perforated gastric ulcer- stable s/p graham patch repair.  Continue strict NPO until upper GI study completed.  Ordered for 09/17/21. Cont serial abdominal exam. NG unable to be placed intraop but seems to be tolerating well  without one so far. ? ?AMS post op essentially resolved at this point.  No concern for respiratory compromise so ok to transfer out from surgery standpoint. ? ?AKI-nephro consulted. Appreciate recs. Continue foley for strict I&Os ? ?Hx of alcohol use- CIWA in place.  Folate thiamine ordered. ? ? ?labs/images/medications/previous chart entries reviewed personally and relevant changes/updates noted above. ? ? ?

## 2021-09-15 NOTE — Progress Notes (Signed)
Patient is due to void. Bladder scan repeat shows 418m residual. Patient does not have urge to void at this time. NP notified. Awaiting further orders.  ?

## 2021-09-16 ENCOUNTER — Inpatient Hospital Stay
Admit: 2021-09-16 | Discharge: 2021-09-16 | Disposition: A | Discharge status: Home / Self Care | Payer: BC Managed Care – PPO | Attending: Student | Admitting: Student

## 2021-09-16 DIAGNOSIS — K255 Chronic or unspecified gastric ulcer with perforation: Principal | ICD-10-CM

## 2021-09-16 DIAGNOSIS — K275 Chronic or unspecified peptic ulcer, site unspecified, with perforation: Secondary | ICD-10-CM | POA: Diagnosis not present

## 2021-09-16 LAB — CBC
HCT: 24.2 % — ABNORMAL LOW (ref 36.0–46.0)
Hemoglobin: 8.2 g/dL — ABNORMAL LOW (ref 12.0–15.0)
MCH: 35.5 pg — ABNORMAL HIGH (ref 26.0–34.0)
MCHC: 33.9 g/dL (ref 30.0–36.0)
MCV: 104.8 fL — ABNORMAL HIGH (ref 80.0–100.0)
Platelets: 124 10*3/uL — ABNORMAL LOW (ref 150–400)
RBC: 2.31 MIL/uL — ABNORMAL LOW (ref 3.87–5.11)
RDW: 13.1 % (ref 11.5–15.5)
WBC: 10.5 10*3/uL (ref 4.0–10.5)
nRBC: 0 % (ref 0.0–0.2)

## 2021-09-16 LAB — GLUCOSE, CAPILLARY
Glucose-Capillary: 108 mg/dL — ABNORMAL HIGH (ref 70–99)
Glucose-Capillary: 65 mg/dL — ABNORMAL LOW (ref 70–99)
Glucose-Capillary: 74 mg/dL (ref 70–99)
Glucose-Capillary: 84 mg/dL (ref 70–99)
Glucose-Capillary: 85 mg/dL (ref 70–99)
Glucose-Capillary: 87 mg/dL (ref 70–99)
Glucose-Capillary: 91 mg/dL (ref 70–99)

## 2021-09-16 LAB — AMMONIA: Ammonia: 19 umol/L (ref 9–35)

## 2021-09-16 LAB — HEPATIC FUNCTION PANEL
ALT: 14 U/L (ref 0–44)
AST: 20 U/L (ref 15–41)
Albumin: 1.5 g/dL — ABNORMAL LOW (ref 3.5–5.0)
Alkaline Phosphatase: 212 U/L — ABNORMAL HIGH (ref 38–126)
Bilirubin, Direct: 0.5 mg/dL — ABNORMAL HIGH (ref 0.0–0.2)
Indirect Bilirubin: 0.8 mg/dL (ref 0.3–0.9)
Total Bilirubin: 1.3 mg/dL — ABNORMAL HIGH (ref 0.3–1.2)
Total Protein: 5.3 g/dL — ABNORMAL LOW (ref 6.5–8.1)

## 2021-09-16 LAB — BASIC METABOLIC PANEL
Anion gap: 11 (ref 5–15)
BUN: 82 mg/dL — ABNORMAL HIGH (ref 6–20)
CO2: 19 mmol/L — ABNORMAL LOW (ref 22–32)
Calcium: 8.5 mg/dL — ABNORMAL LOW (ref 8.9–10.3)
Chloride: 108 mmol/L (ref 98–111)
Creatinine, Ser: 2.63 mg/dL — ABNORMAL HIGH (ref 0.44–1.00)
GFR, Estimated: 21 mL/min — ABNORMAL LOW (ref >60)
Glucose, Bld: 92 mg/dL (ref 70–99)
Potassium: 4 mmol/L (ref 3.5–5.1)
Sodium: 138 mmol/L (ref 135–145)

## 2021-09-16 LAB — IRON AND TIBC: Iron: 72 ug/dL (ref 28–170)

## 2021-09-16 LAB — MAGNESIUM: Magnesium: 2.3 mg/dL (ref 1.7–2.4)

## 2021-09-16 LAB — FERRITIN: Ferritin: 520 ng/mL — ABNORMAL HIGH (ref 11–307)

## 2021-09-16 LAB — PHOSPHORUS: Phosphorus: 7.9 mg/dL — ABNORMAL HIGH (ref 2.5–4.6)

## 2021-09-16 LAB — VITAMIN D 25 HYDROXY (VIT D DEFICIENCY, FRACTURES): Vit D, 25-Hydroxy: 11.51 ng/mL — ABNORMAL LOW (ref 30–100)

## 2021-09-16 LAB — BRAIN NATRIURETIC PEPTIDE: B Natriuretic Peptide: 589.2 pg/mL — ABNORMAL HIGH (ref 0.0–100.0)

## 2021-09-16 MED ORDER — ACETAMINOPHEN 10 MG/ML IV SOLN
1000.0000 mg | Freq: Four times a day (QID) | INTRAVENOUS | Status: DC
  Administered 2021-09-16 – 2021-09-17 (×3): 1000 mg via INTRAVENOUS
  Filled 2021-09-16 (×4): qty 100

## 2021-09-16 MED ORDER — HYDROMORPHONE HCL 1 MG/ML IJ SOLN
0.5000 mg | INTRAMUSCULAR | Status: DC | PRN
  Administered 2021-09-16 – 2021-09-25 (×19): 0.5 mg via INTRAVENOUS
  Filled 2021-09-16 (×22): qty 0.5

## 2021-09-16 MED ORDER — DEXTROSE-NACL 5-0.9 % IV SOLN: INTRAVENOUS | Status: AC

## 2021-09-16 MED ORDER — HYDRALAZINE HCL 20 MG/ML IJ SOLN: 10.0000 mg | Freq: Four times a day (QID) | INTRAMUSCULAR | Status: DC | PRN

## 2021-09-16 NOTE — Progress Notes (Signed)
Patient still not able to void. Bladder scan shows 337m. No discomfort or pain. Will repeat scan in one hour per protocol.  ?

## 2021-09-16 NOTE — Progress Notes (Signed)
Patient blood glucose was 65, contacted Dr. Hampton Abbot, patient started on D5 NS @ 100 rather than an amp of D50 ?

## 2021-09-16 NOTE — Progress Notes (Signed)
[facility] Progress Note ? ?Patient: Maria [name]    GYJ:[id number]  DOA: 09/13/2021    ? ?Date of Service: the patient was seen and examined on 09/16/2021 ? ?Chief Complaint  ?Patient presents with  ? Abnormal Lab  ? ?Brief hospital course: ? Maria [name] is a 51 y.o. female with PMH of HTN, pancreatitis, GERD/gastritis, anxiety, depression, alcohol abuse presented at [facility] ED with abdominal pain.  Patient was admitted on 09/13/2021 under general surgery due to pneumoperitoneum secondary to perforated peptic ulcer, patient was taken to the OR s/p ex lap with primary closure of perforated peptic ulcer with Phillip Heal patch repair with intra-abdominal abscess drainage.  Postop patient became unresponsive so code stroke was called but work-up was negative.  Patient was in the ICU, downgraded on the medical floor on 09/16/2021.  General surgery is primary team, hospitalist consulted for medical management.  Nephrology was also consulted for acute renal failure.  Further management as below. ? ? ?Assessment and Plan: ? ? ?Perforated peptic ulcer, pneumoperitoneum and abscess s/p ex lap done on 3/15 by general surgery ?Currently patient is on Zosyn and Diflucan ?Continue pantoprazole 40 twice daily ?Keep n.p.o. as per primary team ?Continue gentle hydration ?Continue as needed pain medications as per general surgery team ? ?AKI, multifactorial could be due to volume loss, NSAIDs, contrast exposure ?Mild acidosis ?Continue IV fluid ?Nephrotoxic medications, use renally dose medications ?Monitor urine output and renal functions daily ? ? ?HTN ?Use IV hydralazine as needed ? ?Alcohol abuse, patient denies any withdrawal symptoms in the past ?Continue thiamine and folic acid IV and switch to p.o. when orally allowed ?Continue CIWA protocol for withdrawal symptoms ? ?Body mass index is 22.58 kg/m?Marland Kitchen  ?Nutrition Problem: Moderate Malnutrition ?Etiology: social / environmental circumstances (etoh abuse) ?Interventions: ?  ? ?    ?Diet: N.p.o. ?DVT Prophylaxis: Subcutaneous Lovenox  ? ?Advance goals of care discussion: Full code ? ?Family Communication: family was NOT present at bedside, at the time of interview.  ?The pt provided permission to discuss medical plan with the family. Opportunity was given to ask question and all questions were answered satisfactorily.  ? ?Disposition:  ?Pt is from Home, admitted with peptic ulcer perforation, still has pain and n.p.o. on IV fluids, which precludes a safe discharge. ?Discharge to most likely home, when when cleared by general surgery. ?Neurosurgery is the primary team ? ? ?Subjective: No significant events overnight, patient is still having abdominal pain 8/10, denies any chest pain or palpitation, no shortness of breath, patient remained n.p.o. ? ?Physical Exam: ?General:  alert oriented to time, place, and person.  ?Appear in no distress, affect appropriate ?Eyes: PERRLA ?ENT: Oral Mucosa Clear, moist  ?Neck: no JVD,  ?Cardiovascular: S1 and S2 Present, no Murmur,  ?Respiratory: good respiratory effort, Bilateral Air entry equal and Decreased, non Crackles, o wheezes ?Abdomen: Bowel Sound present, Soft and generalized postop tenderness, dressing CDI, left lower quadrant JP drain intact ?Skin: No rashes ?Extremities:  Pedal edema,  calf tenderness ?Neurologic: without any new focal findings ?Gait not checked due to patient safety concerns ? ?Vitals:  ? 09/16/21 0800 09/16/21 0900 09/16/21 1000 09/16/21 1400  ?BP: (!) 150/79 (!) 159/81 140/81 (!) 141/72  ?Pulse: 65 79 65 62  ?Resp: 15 (!) 22 (!) 21 20  ?Temp: 97.8 ?F (36.6 ?C)   97.6 ?F (36.4 ?C)  ?TempSrc: Oral     ?SpO2: 98% 100% 99% 100%  ?Weight:      ?Height:      ? ? ?  Intake/Output Summary (Last 24 hours) at 09/16/2021 1456 ?Last data filed at 09/16/2021 1210 ?Gross per 24 hour  ?Intake 2469.47 ml  ?Output 1575 ml  ?Net 894.47 ml  ? ?Filed Weights  ? 09/14/21 0215 09/15/21 0351 09/16/21 0500  ?Weight: 53.5 kg 55.1 kg 56 kg  ? ? ?Data  Reviewed: ?I have personally reviewed and interpreted daily labs, tele strips, imagings as discussed above. ?I reviewed all nursing notes, pharmacy notes, vitals, pertinent old records ?I have discussed plan of care as described above with RN and patient/family. ? ?CBC: ?Recent Labs  ?Lab 09/13/21 ?1822 09/14/21 ?0051 09/15/21 ?0343 09/15/21 ?1026 09/16/21 ?5747  ?WBC 20.2* 32.2* 15.4*  --  10.5  ?NEUTROABS 17.4* 29.2*  --   --   --   ?HGB 11.4* 10.8* 8.3* 8.7* 8.2*  ?HCT 32.1* 31.2* 24.0* 25.7* 24.2*  ?MCV 100.0 102.6* 103.0*  --  104.8*  ?PLT 147* 196 125*  --  124*  ? ?Basic Metabolic Panel: ?Recent Labs  ?Lab 09/13/21 ?1233 09/13/21 ?1822 09/14/21 ?0051 09/15/21 ?0343 09/16/21 ?3403  ?NA 128* 127* 130* 136 138  ?K 4.0 3.6 3.3* 3.9 4.0  ?CL 87* 87* 97* 104 108  ?CO2 '29 27 23 '$ 18* 19*  ?GLUCOSE 89 97 92 103* 92  ?BUN 87* 90* 77* 84* 82*  ?CREATININE 2.99* 3.22* 2.50* 2.69* 2.63*  ?CALCIUM 9.0 9.3 7.8* 7.9* 8.5*  ?MG  --   --   --  2.3 2.3  ?PHOS  --   --   --  9.8* 7.9*  ? ? ?Studies: ?No results found.  ?Scheduled Meds: ? Chlorhexidine Gluconate Cloth  6 each Topical Daily  ? docusate  100 mg Per Tube BID  ? enoxaparin (LOVENOX) injection  30 mg Subcutaneous QHS  ? folic acid  1 mg Intravenous Daily  ? pantoprazole  40 mg Intravenous Q12H  ? [START ON 09/17/2021] thiamine  100 mg Oral Daily  ? ?Continuous Infusions: ? sodium chloride Stopped (09/15/21 1433)  ? acetaminophen    ? fluconazole (DIFLUCAN) IV 100 mL/hr at 09/16/21 1200  ? lactated ringers Stopped (09/16/21 1109)  ? piperacillin-tazobactam (ZOSYN)  IV 3.375 g (09/16/21 1430)  ? ?PRN Meds: sodium chloride, hydrALAZINE, HYDROmorphone (DILAUDID) injection, LORazepam **OR** LORazepam, ondansetron **OR** ondansetron (ZOFRAN) IV ? ?Time spent: 35 minutes ? ?Author: ?Val Riles MD ?[facility] ?09/16/2021 2:56 PM ? ?To reach On-call, see care teams to locate the attending and reach out to them via [email or web address]. ?If 7PM-7AM, please contact  night-coverage ?If you still have difficulty reaching the attending provider, please page the [facility] (Director on Call) for [facility] on amion for assistance. ? ?

## 2021-09-16 NOTE — Consult Note (Signed)
Pharmacy Antibiotic Note ? ?Maria [name] is a 51 y.o. female with medical history including anxiety / depression, GERD / gastritis, EtOH use disorder admitted on 09/13/2021 with  perforated peptic ulcer . Patient is s/p ExLap with Phillip Heal patch repair of perforated ulcer and intra-abdominal abscess drainage on 3/15.  Pharmacy has been consulted for Zosyn dosing. Patient is also ordered fluconazole. ? ?Scr 3.22 >> 2.5 >> 2.69>> 2.63. Nephrology has been consulted. Patient currently receiving fluids. ? ?Plan: ? ?Continue Zosyn 3.375 g IV q8h (4-hr infusion) ? ?Patient is borderline renal dose adjustment. CrCl in chart 20 mL/min.Will maintain current dosing for now and continue to follow UOP / Scr trend for consideration of further dose adjustments ? ? ?Height: '5\' 2"'$  (157.5 cm) ?Weight: 56 kg (123 lb 7.3 oz) ?IBW/kg (Calculated) : 50.1 ? ?Temp (24hrs), Avg:97.8 ?F (36.6 ?C), Min:97.5 ?F (36.4 ?C), Max:98.1 ?F (36.7 ?C) ? ?Recent Labs  ?Lab 09/13/21 ?1233 09/13/21 ?1822 09/14/21 ?0051 09/14/21 ?0446 09/15/21 ?0343 09/16/21 ?8916  ?WBC  --  20.2* 32.2*  --  15.4* 10.5  ?CREATININE 2.99* 3.22* 2.50*  --  2.69* 2.63*  ?LATICACIDVEN  --   --  1.2 0.7  --   --   ? ?  ?Estimated Creatinine Clearance: 20 mL/min (A) (by C-G formula based on SCr of 2.63 mg/dL (H)).   ? ?Allergies  ?Allergen Reactions  ? Anesthesia S-I-40 [Propofol]   ? Bee Venom Swelling  ? Codeine Swelling  ? Bupropion Anxiety and Other (See Comments)  ?  Worsened anxiety  ? ? ?Antimicrobials this admission: ?Zosyn 3/15 >>     (x5d) ?Diflucan 3/16 >>  ? ?Dose adjustments this admission: ?N/A ? ?Microbiology results: ?3/15 BCx: NGTD ?3/16 UCx: NG  ?3/16 MRSA PCR: (-) ? ?Thank you for allowing pharmacy to be a part of this patient?s care. ? ?Corine Solorio A ?09/16/2021 9:21 AM ? ?

## 2021-09-16 NOTE — Consult Note (Signed)
PHARMACY CONSULT NOTE ? ?Pharmacy Consult for Electrolyte Monitoring and Replacement  ? ?Recent Labs: ?Potassium (mmol/L)  ?Date Value  ?09/16/2021 4.0  ?04/26/2014 2.7 (L)  ? ?Magnesium (mg/dL)  ?Date Value  ?09/16/2021 2.3  ? ?Calcium (mg/dL)  ?Date Value  ?09/16/2021 8.5 (L)  ? ?Calcium, Total (mg/dL)  ?Date Value  ?04/26/2014 9.9  ? ?Albumin (g/dL)  ?Date Value  ?09/16/2021 1.5 (L)  ?04/26/2014 3.9  ? ?Phosphorus (mg/dL)  ?Date Value  ?09/16/2021 7.9 (H)  ? ?Sodium (mmol/L)  ?Date Value  ?09/16/2021 138  ?04/26/2014 135 (L)  ? ?Assessment: ?Patient is a 51 y/o F with medical history including anxiety / depression, GERD, gastritis who is admitted with perforated peptic ulcer. Patient is s/p ExLap with Phillip Heal patch repair of perforated ulcer and intra-abdominal abscess drainage on 3/15. Pharmacy consulted to assist with electrolyte monitoring and replacement as indicated. ? ?Diet: NPO ?MIVF: LR at 100 cc/hr ? ?Scr 3.22 >> 2.5 >> 2.69 >> 2.63 ? ?Goal of Therapy:  ?Electrolytes within normal limits ? ?Plan:  ?--Persistent renal dysfunction noted, Co2 19; MIVF changed to LR ?--Hyponatremia resolved with MIVF ?--Hyperphosphatemia, continue to monitor ?--Follow-up electrolytes with AM labs tomorrow ? ?Maria [name] A ?09/16/2021 9:18 AM  ?

## 2021-09-16 NOTE — Progress Notes (Signed)
09/16/2021 ? ?Subjective: ?Patient is 3 Days Post-Op status post exploratory laparotomy with primary closure of perforated peptic ulcer with Phillip Heal patch repair with intra-abdominal abscess drainage.  No acute events overnight.  Patient reports that her pain is overall stable without any worsening.  Denies any bowel function but also denies any nausea at this point. ? ?Vital signs: ?Temp:  [97.5 ?F (36.4 ?C)-98.1 ?F (36.7 ?C)] 97.8 ?F (36.6 ?C) (03/18 0800) ?Pulse Rate:  [60-79] 65 (03/18 1000) ?Resp:  [12-23] 21 (03/18 1000) ?BP: (127-159)/(72-84) 140/81 (03/18 1000) ?SpO2:  [95 %-100 %] 99 % (03/18 1000) ?Weight:  [56 kg] 56 kg (03/18 0500)  ? ?Intake/Output: ?03/17 0701 - 03/18 0700 ?In: 2225.9 [I.V.:1897.7; IV Piggyback:328.2] ?Out: 1430 [Urine:1380; Drains:50] ?  ? ?Physical Exam: ?Constitutional: No acute distress ?Abdomen: Soft, nondistended, appropriately tender to palpation.  Midline incision is clean, dry, intact with staples in place.  JP drain with serosanguineous fluid. ? ?Labs:  ?Recent Labs  ?  09/15/21 ?0343 09/15/21 ?1026 09/16/21 ?2774  ?WBC 15.4*  --  10.5  ?HGB 8.3* 8.7* 8.2*  ?HCT 24.0* 25.7* 24.2*  ?PLT 125*  --  124*  ? ?Recent Labs  ?  09/15/21 ?0343 09/16/21 ?1287  ?NA 136 138  ?K 3.9 4.0  ?CL 104 108  ?CO2 18* 19*  ?GLUCOSE 103* 92  ?BUN 84* 82*  ?CREATININE 2.69* 2.63*  ?CALCIUM 7.9* 8.5*  ? ?Recent Labs  ?  09/14/21 ?0051  ?LABPROT 19.1*  ?INR 1.6*  ? ? ?Imaging: ?No results found. ? ?Assessment/Plan: ?This is a 51 y.o. female s/p exploratory laparotomy with primary repair of perforated gastric ulcer. ? ?- Patient appears stable today without any worsening mental status and with stable pain control.  Her white blood cell count has normalized and is 10.5 today down from 15.4 yesterday.  Creatinine is relatively stable to improving and is down to 2.63 from 2.69.  Appreciate nephrology's input and medical teams input in the management of this patient. ?- Continue n.p.o. status strict until  contrast study to be done tomorrow to evaluate the repair and for any potential leaks. ?- Continue IV fluid hydration, pain control, IV antibiotics. ? ? ?Melvyn Neth, MD ?[facility]  ?

## 2021-09-16 NOTE — Progress Notes (Signed)
Shift Summary: ?Patient A/O x 4 entire shift. Suitable for swallow evaluation but remains strict NPO per surgery for abd imaging scheduled for tomorrow. Fentanyl given for 6/10 pain x1. VSS. 67m serosanguinous drainage from JP drain. Midline dressing unchanged. Patient unable to void. Bladder scans performed x 3 with on I/O done. CVC removed. No acute events this shift.  ?

## 2021-09-16 NOTE — Progress Notes (Signed)
?[facility]  ?PROGRESS NOTE  ? ?Subjective:  ? ?Patient seen in the ICU.  She is awake and alert in NAD. ?S/p gastric surgery. ? ?Objective:  ?Vital signs in last 24 hours:  ?Temp:  [97.5 ?F (36.4 ?C)-98.1 ?F (36.7 ?C)] 97.8 ?F (36.6 ?C) (03/18 0800) ?Pulse Rate:  [60-79] 65 (03/18 1000) ?Resp:  [12-23] 21 (03/18 1000) ?BP: (127-159)/(71-84) 140/81 (03/18 1000) ?SpO2:  [95 %-100 %] 99 % (03/18 1000) ?Weight:  [56 kg] 56 kg (03/18 0500) ? ?Weight change: 0.9 kg ?Filed Weights  ? 09/14/21 0215 09/15/21 0351 09/16/21 0500  ?Weight: 53.5 kg 55.1 kg 56 kg  ? ? ?Intake/Output: ?I/O last 3 completed shifts: ?In: 3631.5 [I.V.:3103.2; IV Piggyback:528.2] ?Out: 6578 [Urine:1780; Drains:50] ?  ?Intake/Output this shift: ? Total I/O ?In: 573.7 [I.V.:356.2; IV Piggyback:217.5] ?Out: 465 [Urine:450; Drains:15] ? ?Physical Exam: ?General:  No acute distress  ?Head:  Normocephalic, atraumatic. Moist oral mucosal membranes  ?Eyes:  Anicteric  ?Neck:  Supple  ?Lungs:   Clear to auscultation, normal effort  ?Heart:  S1S2 no rubs  ?Abdomen:  S/p surgery  ?Extremities:  peripheral edema.  ?Neurologic:  Awake, alert, following commands  ?Skin:  No lesions  ?Access:   ? ? ?Basic Metabolic Panel: ?Recent Labs  ?Lab 09/13/21 ?1233 09/13/21 ?1822 09/14/21 ?0051 09/15/21 ?0343 09/16/21 ?4696  ?NA 128* 127* 130* 136 138  ?K 4.0 3.6 3.3* 3.9 4.0  ?CL 87* 87* 97* 104 108  ?CO2 '29 27 23 '$ 18* 19*  ?GLUCOSE 89 97 92 103* 92  ?BUN 87* 90* 77* 84* 82*  ?CREATININE 2.99* 3.22* 2.50* 2.69* 2.63*  ?CALCIUM 9.0 9.3 7.8* 7.9* 8.5*  ?MG  --   --   --  2.3 2.3  ?PHOS  --   --   --  9.8* 7.9*  ? ? ?CBC: ?Recent Labs  ?Lab 09/13/21 ?1822 09/14/21 ?0051 09/15/21 ?0343 09/15/21 ?1026 09/16/21 ?2952  ?WBC 20.2* 32.2* 15.4*  --  10.5  ?NEUTROABS 17.4* 29.2*  --   --   --   ?HGB 11.4* 10.8* 8.3* 8.7* 8.2*  ?HCT 32.1* 31.2* 24.0* 25.7* 24.2*  ?MCV 100.0 102.6* 103.0*  --  104.8*  ?PLT 147* 196 125*  --  124*  ? ? ? ?Urinalysis: ?Recent Labs  ?   09/14/21 ?0230  ?COLORURINE YELLOW*  ?LABSPEC 1.013  ?PHURINE 5.0  ?GLUCOSEU NEGATIVE  ?HGBUR SMALL*  ?BILIRUBINUR NEGATIVE  ?KETONESUR NEGATIVE  ?PROTEINUR NEGATIVE  ?NITRITE NEGATIVE  ?LEUKOCYTESUR NEGATIVE  ?  ? ? ?Imaging: ?No results found. ? ? ?Medications:  ? ? sodium chloride Stopped (09/15/21 1433)  ? fluconazole (DIFLUCAN) IV 100 mL/hr at 09/16/21 1200  ? lactated ringers Stopped (09/16/21 1109)  ? piperacillin-tazobactam (ZOSYN)  IV 11.25 mL/hr at 09/16/21 1200  ? ? Chlorhexidine Gluconate Cloth  6 each Topical Daily  ? docusate  100 mg Per Tube BID  ? enoxaparin (LOVENOX) injection  30 mg Subcutaneous QHS  ? folic acid  1 mg Intravenous Daily  ? pantoprazole  40 mg Intravenous Q12H  ? [START ON 09/17/2021] thiamine  100 mg Oral Daily  ? ? ?Assessment/ Plan:  ?   ?Principal Problem: ?  Perforated peptic ulcer ([place]) ?Active Problems: ?  Anesthesia complication, initial encounter ?  Malnutrition of moderate degree ? ?51 year old female with history of anxiety, depression, GERD, hypertension, alcohol abuse admitted to the hospital with history of perforated peptic ulcer disease.  She is s/p repair with a gastric patch. ? ?#1: Acute kidney  injury: Acute kidney injury most likely secondary to contrast nephropathy.  Renal indices are slightly improving.  Patient may need Foley catheter placement for bladder retention. ? ?#2: Sepsis: S/p perforated peptic ulcer with abscess.  Continue antibiotics as ordered. ? ?#3: Perforated peptic ulcer disease: Patient is s/p expiratory laparotomy closure of peptic ulcer with Phillip Heal patch. ? ?Patient may need IV fluids with Ringer's lactate at 75 cc an hour.  We will continue to follow with ICU staff. ? ? LOS: 2 ?Lyla Son, MD ?[facility] ?3/18/202312:54 PM ?  ?

## 2021-09-17 ENCOUNTER — Inpatient Hospital Stay: Payer: BC Managed Care – PPO

## 2021-09-17 ENCOUNTER — Other Ambulatory Visit: Payer: Self-pay

## 2021-09-17 ENCOUNTER — Encounter: Payer: Self-pay | Admitting: Surgery

## 2021-09-17 ENCOUNTER — Inpatient Hospital Stay: Payer: BC Managed Care – PPO | Admitting: Anesthesiology

## 2021-09-17 ENCOUNTER — Encounter
Admission: EM | Disposition: A | Discharge status: Home Health | Payer: Self-pay | Source: Home / Self Care | Attending: Surgery

## 2021-09-17 DIAGNOSIS — K251 Acute gastric ulcer with perforation: Secondary | ICD-10-CM | POA: Diagnosis not present

## 2021-09-17 DIAGNOSIS — K275 Chronic or unspecified peptic ulcer, site unspecified, with perforation: Secondary | ICD-10-CM | POA: Diagnosis not present

## 2021-09-17 DIAGNOSIS — K255 Chronic or unspecified gastric ulcer with perforation: Secondary | ICD-10-CM | POA: Diagnosis not present

## 2021-09-17 HISTORY — PX: REPAIR OF PERFORATED ULCER: SHX6065

## 2021-09-17 HISTORY — PX: LAPAROTOMY: SHX154

## 2021-09-17 LAB — GLUCOSE, CAPILLARY
Glucose-Capillary: 126 mg/dL — ABNORMAL HIGH (ref 70–99)
Glucose-Capillary: 119 mg/dL — ABNORMAL HIGH (ref 70–99)
Glucose-Capillary: 94 mg/dL (ref 70–99)
Glucose-Capillary: 96 mg/dL (ref 70–99)

## 2021-09-17 LAB — HEPATIC FUNCTION PANEL
ALT: 15 U/L (ref 0–44)
AST: 23 U/L (ref 15–41)
Albumin: 1.7 g/dL — ABNORMAL LOW (ref 3.5–5.0)
Alkaline Phosphatase: 236 U/L — ABNORMAL HIGH (ref 38–126)
Bilirubin, Direct: 0.5 mg/dL — ABNORMAL HIGH (ref 0.0–0.2)
Indirect Bilirubin: 0.7 mg/dL (ref 0.3–0.9)
Total Bilirubin: 1.2 mg/dL (ref 0.3–1.2)
Total Protein: 5.6 g/dL — ABNORMAL LOW (ref 6.5–8.1)

## 2021-09-17 LAB — CBC
HCT: 26.1 % — ABNORMAL LOW (ref 36.0–46.0)
Hemoglobin: 8.4 g/dL — ABNORMAL LOW (ref 12.0–15.0)
MCH: 34.9 pg — ABNORMAL HIGH (ref 26.0–34.0)
MCHC: 32.2 g/dL (ref 30.0–36.0)
MCV: 108.3 fL — ABNORMAL HIGH (ref 80.0–100.0)
Platelets: 130 10*3/uL — ABNORMAL LOW (ref 150–400)
RBC: 2.41 MIL/uL — ABNORMAL LOW (ref 3.87–5.11)
RDW: 13.3 % (ref 11.5–15.5)
WBC: 7.6 10*3/uL (ref 4.0–10.5)
nRBC: 0 % (ref 0.0–0.2)

## 2021-09-17 LAB — PROTIME-INR
INR: 1.7 — ABNORMAL HIGH (ref 0.8–1.2)
Prothrombin Time: 19.8 seconds — ABNORMAL HIGH (ref 11.4–15.2)

## 2021-09-17 LAB — BASIC METABOLIC PANEL
Anion gap: 5 (ref 5–15)
BUN: 71 mg/dL — ABNORMAL HIGH (ref 6–20)
CO2: 22 mmol/L (ref 22–32)
Calcium: 8.5 mg/dL — ABNORMAL LOW (ref 8.9–10.3)
Chloride: 114 mmol/L — ABNORMAL HIGH (ref 98–111)
Creatinine, Ser: 2.2 mg/dL — ABNORMAL HIGH (ref 0.44–1.00)
GFR, Estimated: 26 mL/min — ABNORMAL LOW (ref >60)
Glucose, Bld: 128 mg/dL — ABNORMAL HIGH (ref 70–99)
Potassium: 3.8 mmol/L (ref 3.5–5.1)
Sodium: 141 mmol/L (ref 135–145)

## 2021-09-17 LAB — ECHOCARDIOGRAM COMPLETE
AR max vel: 2.21 cm2
AV Peak grad: 6.2 mmHg
Ao pk vel: 1.24 m/s
Area-P 1/2: 3.74 cm2
Height: 62 in
S' Lateral: 3.6 cm
Weight: 1975.32 oz

## 2021-09-17 LAB — LIPASE, BLOOD: Lipase: 25 U/L (ref 11–51)

## 2021-09-17 LAB — PHOSPHORUS: Phosphorus: 4.4 mg/dL (ref 2.5–4.6)

## 2021-09-17 LAB — MAGNESIUM: Magnesium: 2.1 mg/dL (ref 1.7–2.4)

## 2021-09-17 LAB — AMMONIA: Ammonia: 26 umol/L (ref 9–35)

## 2021-09-17 SURGERY — LAPAROTOMY, EXPLORATORY
Anesthesia: General

## 2021-09-17 MED ORDER — PROPOFOL 10 MG/ML IV BOLUS
INTRAVENOUS | Status: AC
Start: 2021-09-17 — End: ?
  Filled 2021-09-17: qty 20

## 2021-09-17 MED ORDER — FENTANYL CITRATE (PF) 100 MCG/2ML IJ SOLN
INTRAMUSCULAR | Status: DC | PRN
  Administered 2021-09-17: 25 ug via INTRAVENOUS
  Administered 2021-09-17: 100 ug via INTRAVENOUS
  Administered 2021-09-17: 25 ug via INTRAVENOUS

## 2021-09-17 MED ORDER — LACTATED RINGERS IV SOLN: INTRAVENOUS | Status: DC | PRN

## 2021-09-17 MED ORDER — PROPOFOL 10 MG/ML IV BOLUS
INTRAVENOUS | Status: DC | PRN
  Administered 2021-09-17: 80 mg via INTRAVENOUS

## 2021-09-17 MED ORDER — PHENYLEPHRINE 40 MCG/ML (10ML) SYRINGE FOR IV PUSH (FOR BLOOD PRESSURE SUPPORT)
PREFILLED_SYRINGE | INTRAVENOUS | Status: AC
  Filled 2021-09-17: qty 10

## 2021-09-17 MED ORDER — FENTANYL CITRATE (PF) 100 MCG/2ML IJ SOLN
INTRAMUSCULAR | Status: AC
  Filled 2021-09-17: qty 2

## 2021-09-17 MED ORDER — ONDANSETRON HCL 4 MG/2ML IJ SOLN: 4.0000 mg | Freq: Once | INTRAMUSCULAR | Status: DC | PRN

## 2021-09-17 MED ORDER — OXYCODONE HCL 5 MG/5ML PO SOLN: 5.0000 mg | Freq: Once | ORAL | Status: DC | PRN

## 2021-09-17 MED ORDER — BUPIVACAINE-EPINEPHRINE (PF) 0.25% -1:200000 IJ SOLN
INTRAMUSCULAR | Status: AC
  Filled 2021-09-17: qty 30

## 2021-09-17 MED ORDER — SUGAMMADEX SODIUM 200 MG/2ML IV SOLN
INTRAVENOUS | Status: DC | PRN
  Administered 2021-09-17: 200 mg via INTRAVENOUS

## 2021-09-17 MED ORDER — FENTANYL CITRATE (PF) 100 MCG/2ML IJ SOLN
INTRAMUSCULAR | Status: AC
  Administered 2021-09-17: 50 ug via INTRAVENOUS
  Filled 2021-09-17: qty 2

## 2021-09-17 MED ORDER — PHENYLEPHRINE HCL-NACL 20-0.9 MG/250ML-% IV SOLN
INTRAVENOUS | Status: DC | PRN
  Administered 2021-09-17: 30 ug/min via INTRAVENOUS

## 2021-09-17 MED ORDER — LIDOCAINE HCL (PF) 2 % IJ SOLN
INTRAMUSCULAR | Status: AC
  Filled 2021-09-17: qty 5

## 2021-09-17 MED ORDER — LABETALOL HCL 5 MG/ML IV SOLN
INTRAVENOUS | Status: DC | PRN
  Administered 2021-09-17: 10 mg via INTRAVENOUS

## 2021-09-17 MED ORDER — ACETAMINOPHEN 10 MG/ML IV SOLN
1000.0000 mg | Freq: Four times a day (QID) | INTRAVENOUS | Status: AC
  Administered 2021-09-17 – 2021-09-18 (×3): 1000 mg via INTRAVENOUS
  Filled 2021-09-17 (×4): qty 100

## 2021-09-17 MED ORDER — ALBUMIN HUMAN 5 % IV SOLN
INTRAVENOUS | Status: AC
Start: 2021-09-17 — End: ?
  Filled 2021-09-17: qty 250

## 2021-09-17 MED ORDER — FENTANYL CITRATE (PF) 100 MCG/2ML IJ SOLN
25.0000 ug | INTRAMUSCULAR | Status: DC | PRN
  Administered 2021-09-17: 50 ug via INTRAVENOUS

## 2021-09-17 MED ORDER — ALBUMIN HUMAN 5 % IV SOLN: INTRAVENOUS | Status: DC | PRN

## 2021-09-17 MED ORDER — HEMOSTATIC AGENTS (NO CHARGE) OPTIME
TOPICAL | Status: DC | PRN
  Administered 2021-09-17: 1 via TOPICAL

## 2021-09-17 MED ORDER — DEXAMETHASONE SODIUM PHOSPHATE 10 MG/ML IJ SOLN
INTRAMUSCULAR | Status: AC
  Filled 2021-09-17: qty 1

## 2021-09-17 MED ORDER — MIDAZOLAM HCL 2 MG/2ML IJ SOLN
INTRAMUSCULAR | Status: AC
Start: 2021-09-17 — End: ?
  Filled 2021-09-17: qty 2

## 2021-09-17 MED ORDER — HYDROMORPHONE HCL 1 MG/ML IJ SOLN: 0.5000 mg | INTRAMUSCULAR | Status: DC | PRN

## 2021-09-17 MED ORDER — LABETALOL HCL 5 MG/ML IV SOLN
INTRAVENOUS | Status: AC
  Filled 2021-09-17: qty 4

## 2021-09-17 MED ORDER — DEXAMETHASONE SODIUM PHOSPHATE 10 MG/ML IJ SOLN
INTRAMUSCULAR | Status: DC | PRN
Start: 2021-09-17 — End: 2021-09-17
  Administered 2021-09-17: 10 mg via INTRAVENOUS

## 2021-09-17 MED ORDER — PHENYLEPHRINE HCL (PRESSORS) 10 MG/ML IV SOLN
INTRAVENOUS | Status: DC | PRN
  Administered 2021-09-17: 160 ug via INTRAVENOUS
  Administered 2021-09-17: 40 ug via INTRAVENOUS
  Administered 2021-09-17 (×2): 160 ug via INTRAVENOUS
  Administered 2021-09-17: 40 ug via INTRAVENOUS

## 2021-09-17 MED ORDER — EPHEDRINE 5 MG/ML INJ
INTRAVENOUS | Status: AC
Start: 2021-09-17 — End: ?
  Filled 2021-09-17: qty 10

## 2021-09-17 MED ORDER — PIPERACILLIN-TAZOBACTAM 3.375 G IVPB
INTRAVENOUS | Status: AC
Start: 2021-09-17 — End: 2021-09-17
  Filled 2021-09-17: qty 50

## 2021-09-17 MED ORDER — ONDANSETRON HCL 4 MG/2ML IJ SOLN
INTRAMUSCULAR | Status: AC
Start: 2021-09-17 — End: ?
  Filled 2021-09-17: qty 2

## 2021-09-17 MED ORDER — LIDOCAINE HCL (CARDIAC) PF 100 MG/5ML IV SOSY
PREFILLED_SYRINGE | INTRAVENOUS | Status: DC | PRN
  Administered 2021-09-17: 60 mg via INTRAVENOUS

## 2021-09-17 MED ORDER — OXYCODONE HCL 5 MG PO TABS: 5.0000 mg | ORAL_TABLET | Freq: Once | ORAL | Status: DC | PRN

## 2021-09-17 MED ORDER — VITAMIN D (ERGOCALCIFEROL) 1.25 MG (50000 UNIT) PO CAPS: 50000.0000 [IU] | ORAL_CAPSULE | ORAL | Status: DC

## 2021-09-17 MED ORDER — VISTASEAL 10 ML SINGLE DOSE KIT
PACK | CUTANEOUS | Status: DC | PRN
  Administered 2021-09-17: 10 mL via TOPICAL

## 2021-09-17 MED ORDER — EPHEDRINE SULFATE (PRESSORS) 50 MG/ML IJ SOLN
INTRAMUSCULAR | Status: DC | PRN
  Administered 2021-09-17: 10 mg via INTRAVENOUS
  Administered 2021-09-17: 5 mg via INTRAVENOUS
  Administered 2021-09-17 (×3): 10 mg via INTRAVENOUS
  Administered 2021-09-17: 5 mg via INTRAVENOUS

## 2021-09-17 MED ORDER — ONDANSETRON HCL 4 MG/2ML IJ SOLN
INTRAMUSCULAR | Status: DC | PRN
  Administered 2021-09-17: 4 mg via INTRAVENOUS

## 2021-09-17 MED ORDER — ROCURONIUM BROMIDE 10 MG/ML (PF) SYRINGE
PREFILLED_SYRINGE | INTRAVENOUS | Status: AC
  Filled 2021-09-17: qty 10

## 2021-09-17 MED ORDER — MIDAZOLAM HCL 2 MG/2ML IJ SOLN
INTRAMUSCULAR | Status: DC | PRN
  Administered 2021-09-17: 2 mg via INTRAVENOUS

## 2021-09-17 MED ORDER — PHENYLEPHRINE HCL-NACL 20-0.9 MG/250ML-% IV SOLN
INTRAVENOUS | Status: AC
  Filled 2021-09-17: qty 250

## 2021-09-17 MED ORDER — ROCURONIUM BROMIDE 100 MG/10ML IV SOLN
INTRAVENOUS | Status: DC | PRN
  Administered 2021-09-17: 30 mg via INTRAVENOUS
  Administered 2021-09-17: 70 mg via INTRAVENOUS

## 2021-09-17 SURGICAL SUPPLY — 45 items
CATH ROBINSON RED A/P 18FR (CATHETERS) ×1 IMPLANT
CHLORAPREP W/TINT 26 (MISCELLANEOUS) ×2 IMPLANT
DRAPE LAPAROTOMY 100X77 ABD (DRAPES) ×2 IMPLANT
DRSG OPSITE POSTOP 4X12 (GAUZE/BANDAGES/DRESSINGS) IMPLANT
DRSG TEGADERM 4X10 (GAUZE/BANDAGES/DRESSINGS) IMPLANT
ELECT BLADE 6.5 EXT (BLADE) ×2 IMPLANT
ELECT CAUTERY BLADE TIP 2.5 (TIP) ×2
ELECT REM PT RETURN 9FT ADLT (ELECTROSURGICAL) ×2
ELECTRODE CAUTERY BLDE TIP 2.5 (TIP) ×1 IMPLANT
ELECTRODE REM PT RTRN 9FT ADLT (ELECTROSURGICAL) ×1 IMPLANT
GAUZE 4X4 16PLY ~~LOC~~+RFID DBL (SPONGE) ×2 IMPLANT
GAUZE SPONGE 4X4 12PLY STRL (GAUZE/BANDAGES/DRESSINGS) ×2 IMPLANT
GLOVE SURG SYN 7.0 (GLOVE) ×4 IMPLANT
GLOVE SURG SYN 7.0 PF PI (GLOVE) ×2 IMPLANT
GLOVE SURG SYN 7.5  E (GLOVE) ×2
GLOVE SURG SYN 7.5 E (GLOVE) ×2 IMPLANT
GLOVE SURG SYN 7.5 PF PI (GLOVE) ×2 IMPLANT
GOWN STRL REUS W/ TWL LRG LVL3 (GOWN DISPOSABLE) ×4 IMPLANT
GOWN STRL REUS W/TWL LRG LVL3 (GOWN DISPOSABLE) ×4
HEMOSTAT ARISTA ABSORB 3G PWDR (HEMOSTASIS) ×1 IMPLANT
ILLUMINATOR WAVEGUIDE N/F (MISCELLANEOUS) ×1 IMPLANT
LABEL OR SOLS (LABEL) ×2 IMPLANT
LIGASURE IMPACT 36 18CM CVD LR (INSTRUMENTS) ×1 IMPLANT
MANIFOLD NEPTUNE II (INSTRUMENTS) ×2 IMPLANT
NEEDLE HYPO 22GX1.5 SAFETY (NEEDLE) ×2 IMPLANT
NS IRRIG 1000ML POUR BTL (IV SOLUTION) ×2 IMPLANT
PACK BASIN MAJOR ARMC (MISCELLANEOUS) ×2 IMPLANT
PACK COLON CLEAN CLOSURE (MISCELLANEOUS) ×2 IMPLANT
SEPRAFILM MEMBRANE 5X6 (MISCELLANEOUS) IMPLANT
SPONGE T-LAP 18X18 ~~LOC~~+RFID (SPONGE) ×6 IMPLANT
STAPLER SKIN PROX 35W (STAPLE) ×2 IMPLANT
SUT PDS AB 1 CT1 36 (SUTURE) ×2 IMPLANT
SUT PROLENE 2 0 SH DA (SUTURE) IMPLANT
SUT SILK 2 0 (SUTURE) ×1
SUT SILK 2-0 18XBRD TIE 12 (SUTURE) ×1 IMPLANT
SUT SILK 3-0 (SUTURE) ×5 IMPLANT
SUT VIC AB 3-0 SH 27 (SUTURE) ×1
SUT VIC AB 3-0 SH 27X BRD (SUTURE) ×1 IMPLANT
SYR 10ML LL (SYRINGE) ×2 IMPLANT
SYR 20ML LL LF (SYRINGE) IMPLANT
SYR TOOMEY IRRIG 70ML (MISCELLANEOUS) ×2
SYRINGE TOOMEY IRRIG 70ML (MISCELLANEOUS) IMPLANT
TRAY FOLEY MTR SLVR 16FR STAT (SET/KITS/TRAYS/PACK) ×2 IMPLANT
TUBE GASTRO 14F 5C (TUBING) ×1 IMPLANT
WATER STERILE IRR 500ML POUR (IV SOLUTION) ×2 IMPLANT

## 2021-09-17 NOTE — Anesthesia Procedure Notes (Addendum)
Procedure Name: Intubation ?Date/Time: 09/17/2021 7:50 PM ?Performed by: Loree Fee, CRNA ?Pre-anesthesia Checklist: Patient identified, Patient being monitored, Timeout performed, Emergency Drugs available and Suction available ?Patient Re-evaluated:Patient Re-evaluated prior to induction ?Oxygen Delivery Method: Circle system utilized ?Preoxygenation: Pre-oxygenation with 100% oxygen ?Induction Type: IV induction and Rapid sequence ?Laryngoscope Size: 3 and McGraph ?Grade View: Grade I ?Tube type: Oral ?Tube size: 6.5 mm ?Number of attempts: 1 ?Airway Equipment and Method: Stylet ?Placement Confirmation: ETT inserted through vocal cords under direct vision, positive ETCO2 and breath sounds checked- equal and bilateral ?Secured at: 21 cm ?Tube secured with: Tape ?Dental Injury: Teeth and Oropharynx as per pre-operative assessment  ? ? ? ? ?

## 2021-09-17 NOTE — Progress Notes (Signed)
?[facility]  ?PROGRESS NOTE  ? ?Subjective:  ? ?Patient seen on the regular floor. Feels weak and tired. ?Waiting for GI studies. ? ?Objective:  ?Vital signs: ?Blood pressure 126/77, pulse 70, temperature 97.6 ?F (36.4 ?C), temperature source Oral, resp. rate 18, height '5\' 2"'$  (1.575 m), weight 60 kg, last menstrual period 02/24/2018, SpO2 98 %. ? ?Intake/Output Summary (Last 24 hours) at 09/17/2021 1320 ?Last data filed at 09/17/2021 1148 ?Gross per 24 hour  ?Intake 1634.6 ml  ?Output 750 ml  ?Net 884.6 ml  ? ?Filed Weights  ? 09/15/21 0351 09/16/21 0500 09/17/21 0447  ?Weight: 55.1 kg 56 kg 60 kg  ? ? ? ?Physical Exam: ?General:  No acute distress  ?Head:  Normocephalic, atraumatic. Moist oral mucosal membranes  ?Eyes:  Anicteric  ?Neck:  Supple  ?Lungs:   Clear to auscultation, normal effort  ?Heart:  S1S2 no rubs  ?Abdomen:  S/p surgery  ?Extremities:  peripheral edema.  ?Neurologic:  Awake, alert, following commands  ?Skin:  No lesions  ?Access:   ? ? ?Basic Metabolic Panel: ?Recent Labs  ?Lab 09/13/21 ?1822 09/14/21 ?0051 09/15/21 ?0343 09/16/21 ?1610 09/17/21 ?0507  ?NA 127* 130* 136 138 141  ?K 3.6 3.3* 3.9 4.0 3.8  ?CL 87* 97* 104 108 114*  ?CO2 27 23 18* 19* 22  ?GLUCOSE 97 92 103* 92 128*  ?BUN 90* 77* 84* 82* 71*  ?CREATININE 3.22* 2.50* 2.69* 2.63* 2.20*  ?CALCIUM 9.3 7.8* 7.9* 8.5* 8.5*  ?MG  --   --  2.3 2.3 2.1  ?PHOS  --   --  9.8* 7.9* 4.4  ? ? ?CBC: ?Recent Labs  ?Lab 09/13/21 ?1822 09/14/21 ?0051 09/15/21 ?0343 09/15/21 ?1026 09/16/21 ?9604 09/17/21 ?0507  ?WBC 20.2* 32.2* 15.4*  --  10.5 7.6  ?NEUTROABS 17.4* 29.2*  --   --   --   --   ?HGB 11.4* 10.8* 8.3* 8.7* 8.2* 8.4*  ?HCT 32.1* 31.2* 24.0* 25.7* 24.2* 26.1*  ?MCV 100.0 102.6* 103.0*  --  104.8* 108.3*  ?PLT 147* 196 125*  --  124* 130*  ? ? ? ?Urinalysis: ?No results for input(s): COLORURINE, LABSPEC, [place], GLUCOSEU, HGBUR, BILIRUBINUR, KETONESUR, PROTEINUR, UROBILINOGEN, NITRITE, LEUKOCYTESUR in the last 72 hours. ? ?Invalid  input(s): APPERANCEUR  ? ? ?Imaging: ?ECHOCARDIOGRAM COMPLETE ? ?Result Date: 09/17/2021 ?   ECHOCARDIOGRAM REPORT   Patient Name:   Maria [name] Date of Exam: 09/16/2021 Medical Rec #:  [id number]     Height:       62.0 in Accession #:    [id number]    Weight:       123.5 lb Date of Birth:  Oct 16, 1970     BSA:          1.557 m? Patient Age:    51 years      BP:           140/81 mmHg Patient Gender: F             HR:           66 bpm. Exam Location:  [facility] Procedure: 2D Echo Indications:     Elevated Troponin  History:         Patient has no prior history of Echocardiogram examinations.  Sonographer:     Kathlen Brunswick RDCS Referring Phys:  [id number] Val Riles Diagnosing Phys: Serafina Royals MD IMPRESSIONS  1. Left ventricular ejection fraction, by estimation, is 50 to 55%. The left ventricle has low normal function. The  left ventricle has no regional wall motion abnormalities. Left ventricular diastolic parameters were normal.  2. Right ventricular systolic function is normal. The right ventricular size is normal.  3. The mitral valve is normal in structure. Mild mitral valve regurgitation.  4. The aortic valve is normal in structure. Aortic valve regurgitation is not visualized. FINDINGS  Left Ventricle: Left ventricular ejection fraction, by estimation, is 50 to 55%. The left ventricle has low normal function. The left ventricle has no regional wall motion abnormalities. The left ventricular internal cavity size was normal in size. There is no left ventricular hypertrophy. Left ventricular diastolic parameters were normal. Right Ventricle: The right ventricular size is normal. No increase in right ventricular wall thickness. Right ventricular systolic function is normal. Left Atrium: Left atrial size was normal in size. Right Atrium: Right atrial size was normal in size. Pericardium: There is no evidence of pericardial effusion. Mitral Valve: The mitral valve is normal in structure. Mild mitral valve  regurgitation. Tricuspid Valve: The tricuspid valve is normal in structure. Tricuspid valve regurgitation is mild. Aortic Valve: The aortic valve is normal in structure. Aortic valve regurgitation is not visualized. Aortic valve peak gradient measures 6.2 mmHg. Pulmonic Valve: The pulmonic valve was normal in structure. Pulmonic valve regurgitation is not visualized. Aorta: The aortic root and ascending aorta are structurally normal, with no evidence of dilitation. IAS/Shunts: No atrial level shunt detected by color flow Doppler.  LEFT VENTRICLE PLAX 2D LVIDd:         5.30 cm   Diastology LVIDs:         3.60 cm   LV e' medial:    7.40 cm/s LV PW:         1.10 cm   LV E/e' medial:  12.7 LV IVS:        0.90 cm   LV e' lateral:   9.57 cm/s LVOT diam:     1.90 cm   LV E/e' lateral: 9.9 LV SV:         65 LV SV Index:   42 LVOT Area:     2.84 cm?  RIGHT VENTRICLE RV Basal diam:  2.80 cm RV S prime:     10.80 cm/s TAPSE (M-mode): 2.0 cm LEFT ATRIUM             Index        RIGHT ATRIUM          Index LA Vol (A2C):   65.3 ml 41.93 ml/m?  RA Area:     9.00 cm? LA Vol (A4C):   34.5 ml 22.15 ml/m?  RA Volume:   17.30 ml 11.11 ml/m? LA Biplane Vol: 50.9 ml 32.68 ml/m?  AORTIC VALVE                 PULMONIC VALVE AV Area (Vmax): 2.21 cm?     PV Vmax:       0.84 m/s AV Vmax:        124.00 cm/s  PV Peak grad:  2.8 mmHg AV Peak Grad:   6.2 mmHg LVOT Vmax:      96.60 cm/s LVOT Vmean:     64.000 cm/s LVOT VTI:       0.231 m  AORTA Ao Root diam: 2.80 cm Ao Asc diam:  2.70 cm MITRAL VALVE               TRICUSPID VALVE MV Area (PHT): 3.74 cm?    TV Peak grad:   21.8 mmHg  MV Decel Time: 203 msec    TV Vmax:        2.33 m/s MV E velocity: 94.30 cm/s MV A velocity: 75.40 cm/s  SHUNTS MV E/A ratio:  1.25        Systemic VTI:  0.23 m                            Systemic Diam: 1.90 cm Serafina Royals MD Electronically signed by Serafina Royals MD Signature Date/Time: 09/17/2021/9:25:54 AM    Final    ? ? ?Medications:  ? ? sodium chloride Stopped  (09/15/21 1433)  ? acetaminophen    ? dextrose 5 % and 0.9% NaCl 100 mL/hr at 09/17/21 1148  ? fluconazole (DIFLUCAN) IV 200 mg (09/17/21 0857)  ? piperacillin-tazobactam (ZOSYN)  IV 3.375 g (09/17/21 0504)  ? ? Chlorhexidine Gluconate Cloth  6 each Topical Daily  ? docusate  100 mg Per Tube BID  ? enoxaparin (LOVENOX) injection  30 mg Subcutaneous QHS  ? folic acid  1 mg Intravenous Daily  ? pantoprazole  40 mg Intravenous Q12H  ? thiamine  100 mg Oral Daily  ? [START ON 09/22/2021] Vitamin D (Ergocalciferol)  50,000 Units Oral Q7 days  ? ? ?Assessment/ Plan:  ?   ?Principal Problem: ?  Perforated peptic ulcer ([place]) ?Active Problems: ?  Anesthesia complication, initial encounter ?  Malnutrition of moderate degree ? ?51 year old female with history of anxiety, depression, GERD, hypertension, alcohol abuse admitted to the hospital with history of perforated peptic ulcer disease.  She is s/p repair with a gastric patch and drainage of the abscess. ?  ?#1: Acute kidney injury: Acute kidney injury most likely secondary to contrast nephropathy.  Renal indices are slowly improving.  Continue IV fluids with D5 normal saline. ?  ?#2: Sepsis: S/p perforated peptic ulcer with abscess.  S/p abscess drainage.  Continue antibiotics with fluconazole and Zosyn.   ?  ?#3: Perforated peptic ulcer disease: Patient is s/p expiratory laparotomy closure of peptic ulcer with Phillip Heal patch. ? ?We will continue to monitor closely and give recommendations as required. ? ? LOS: 3 ?Lyla Son, MD ?[facility] ?3/19/20231:20 PM ?  ?

## 2021-09-17 NOTE — Consult Note (Signed)
PHARMACY CONSULT NOTE ? ?Pharmacy Consult for Electrolyte Monitoring and Replacement  ? ?Recent Labs: ?Potassium (mmol/L)  ?Date Value  ?09/17/2021 3.8  ?04/26/2014 2.7 (L)  ? ?Magnesium (mg/dL)  ?Date Value  ?09/17/2021 2.1  ? ?Calcium (mg/dL)  ?Date Value  ?09/17/2021 8.5 (L)  ? ?Calcium, Total (mg/dL)  ?Date Value  ?04/26/2014 9.9  ? ?Albumin (g/dL)  ?Date Value  ?09/17/2021 1.7 (L)  ?04/26/2014 3.9  ? ?Phosphorus (mg/dL)  ?Date Value  ?09/17/2021 4.4  ? ?Sodium (mmol/L)  ?Date Value  ?09/17/2021 141  ?04/26/2014 135 (L)  ? ?Assessment: ?Patient is Maria [name] 51 y/o F with medical history including anxiety / depression, GERD, gastritis who is admitted with perforated peptic ulcer. Patient is s/p ExLap with Maria Maria [name] patch repair of perforated ulcer and intra-abdominal abscess drainage on 3/15. Pharmacy consulted to assist with electrolyte monitoring and replacement as indicated. ? ?Diet: NPO ?MIVF: D5NS at 100 cc/hr ? ?Scr 3.22 >> 2.5 >> 2.69 >> 2.63>> 2.20 ? ?Goal of Therapy:  ?Electrolytes within normal limits ? ?Plan:  ?--renal fxn improvingCo2 22; MIVF D5NS ?--Hyponatremia resolved with MIVF ?--No electrolyte replacement at this time ?--Follow-up electrolytes with AM labs tomorrow ? ?Maria Maria [name] ?09/17/2021 8:54 AM  ?

## 2021-09-17 NOTE — Transfer of Care (Signed)
Immediate Anesthesia Transfer of Care Note ? ?Patient: Maria [name] ? ?Procedure(s) Performed: EXPLORATORY LAPAROTOMY ?REPAIR OF PERFORATED ULCER ? ?Patient Location: PACU ? ?Anesthesia Type:General ? ?Level of Consciousness: awake, alert , oriented and patient cooperative ? ?Airway & Oxygen Therapy: Patient Spontanous Breathing and Patient connected to face mask oxygen ? ?Post-op Assessment: Report given to RN, Post -op Vital signs reviewed and stable and Patient moving all extremities X 4 ? ?Post vital signs: stable  ? ? ?Last Vitals:  ?Vitals Value Taken Time  ?BP 114/72 09/17/21 2234  ?Temp 36.5 ?C 09/17/21 2234  ?Pulse 76 09/17/21 2235  ?Resp 17 09/17/21 2235  ?SpO2 100 % 09/17/21 2235  ?Vitals shown include unvalidated device data. ? ?Last Pain:  ?Vitals:  ? 09/17/21 2234  ?TempSrc: Skin  ?PainSc:   ?   ? ?Patients Stated Pain Goal: 0 (09/17/21 1100) ? ?Complications: No notable events documented. ?

## 2021-09-17 NOTE — Progress Notes (Signed)
[facility] Progress Note ? ?Patient: Maria [name]    QBH:[id number]  DOA: 09/13/2021    ? ?Date of Service: the patient was seen and examined on 09/17/2021 ? ?Chief Complaint  ?Patient presents with  ? Abnormal Lab  ? ?Brief hospital course: ? Maria [name] is a 51 y.o. female with PMH of HTN, pancreatitis, GERD/gastritis, anxiety, depression, alcohol abuse presented at [facility] ED with abdominal pain.  Patient was admitted on 09/13/2021 under general surgery due to pneumoperitoneum secondary to perforated peptic ulcer, patient was taken to the OR s/p ex lap with primary closure of perforated peptic ulcer with Phillip Heal patch repair with intra-abdominal abscess drainage.  Postop patient became unresponsive so code stroke was called but work-up was negative.  Patient was in the ICU, downgraded on the medical floor on 09/16/2021.  General surgery is primary team, hospitalist consulted for medical management.  Nephrology was also consulted for acute renal failure.  Further management as below. ? ? ?Assessment and Plan: ? ? ?Perforated peptic ulcer, pneumoperitoneum and abscess s/p ex lap done on 3/15 by general surgery ?Currently patient is on Zosyn and Diflucan ?Continue pantoprazole 40 twice daily ?Keep n.p.o. as per primary team ?Continue gentle hydration ?Continue as needed pain medications as per general surgery team ? ?AKI, multifactorial could be due to volume loss, NSAIDs, contrast exposure ?Mild acidosis ?Continue IV fluid ?Nephrotoxic medications, use renally dose medications ?Monitor urine output and renal functions daily ? ? ?HTN ?Use IV hydralazine as needed ?Clinically euvolemic, BNP elevated 589 ?Follow TTE ? ? ?Alcohol abuse, patient denies any withdrawal symptoms in the past ?Continue thiamine and folic acid IV and switch to p.o. when orally allowed ?Continue CIWA protocol for withdrawal symptoms ? ?Vitamin D deficiency, vitamin D level 11.5 ?Start vitamin D 50,000 units p.o. weekly once orally  allowed. ? ? ?Body mass index is 22.58 kg/m?Marland Kitchen  ?Nutrition Problem: Moderate Malnutrition ?Etiology: social / environmental circumstances (etoh abuse) ?Interventions: ?  ? ?   ?Diet: N.p.o. ?DVT Prophylaxis: Subcutaneous Lovenox  ? ?Advance goals of care discussion: Full code ? ?Family Communication: family was NOT present at bedside, at the time of interview.  ?The pt provided permission to discuss medical plan with the family. Opportunity was given to ask question and all questions were answered satisfactorily.  ? ?Disposition:  ?Pt is from Home, admitted with peptic ulcer perforation, still has pain and n.p.o. on IV fluids, which precludes a safe discharge. ?Discharge to most likely home, when when cleared by general surgery. ?Neurosurgery is the primary team ? ? ?Subjective: No significant events overnight, abdominal pain 5/10, patient is passing gas and had 1 BM in the morning time today.  Denies any chest pain or palpitation, no shortness of breath. ? ? ? ?Physical Exam: ?General:  alert oriented to time, place, and person.  ?Appear in no distress, affect appropriate ?Eyes: PERRLA ?ENT: Oral Mucosa Clear, moist  ?Neck: no JVD,  ?Cardiovascular: S1 and S2 Present, no Murmur,  ?Respiratory: good respiratory effort, Bilateral Air entry equal and Decreased, non Crackles, o wheezes ?Abdomen: Bowel Sound present, Soft and generalized postop tenderness, dressing CDI, left lower quadrant JP drain intact ?Skin: No rashes ?Extremities:  Pedal edema,  calf tenderness ?Neurologic: without any new focal findings ?Gait not checked due to patient safety concerns ? ?Vitals:  ? 09/16/21 1628 09/16/21 1935 09/17/21 0447 09/17/21 0813  ?BP: 140/68 137/69 136/76 126/77  ?Pulse: 68 70 72 70  ?Resp: '18 16 16 18  '$ ?Temp: 97.8 ?F (36.6 ?C) Marland Kitchen)  97.4 ?F (36.3 ?C) (!) 97.5 ?F (36.4 ?C) 97.6 ?F (36.4 ?C)  ?TempSrc:  Oral Oral Oral  ?SpO2: 100% 98% 99% 98%  ?Weight:   60 kg   ?Height:      ? ? ?Intake/Output Summary (Last 24 hours) at  09/17/2021 1407 ?Last data filed at 09/17/2021 1148 ?Gross per 24 hour  ?Intake 1634.6 ml  ?Output 750 ml  ?Net 884.6 ml  ? ?Filed Weights  ? 09/15/21 0351 09/16/21 0500 09/17/21 0447  ?Weight: 55.1 kg 56 kg 60 kg  ? ? ?Data Reviewed: ?I have personally reviewed and interpreted daily labs, tele strips, imagings as discussed above. ?I reviewed all nursing notes, pharmacy notes, vitals, pertinent old records ?I have discussed plan of care as described above with RN and patient/family. ? ?CBC: ?Recent Labs  ?Lab 09/13/21 ?1822 09/14/21 ?0051 09/15/21 ?0343 09/15/21 ?1026 09/16/21 ?2947 09/17/21 ?0507  ?WBC 20.2* 32.2* 15.4*  --  10.5 7.6  ?NEUTROABS 17.4* 29.2*  --   --   --   --   ?HGB 11.4* 10.8* 8.3* 8.7* 8.2* 8.4*  ?HCT 32.1* 31.2* 24.0* 25.7* 24.2* 26.1*  ?MCV 100.0 102.6* 103.0*  --  104.8* 108.3*  ?PLT 147* 196 125*  --  124* 130*  ? ?Basic Metabolic Panel: ?Recent Labs  ?Lab 09/13/21 ?1822 09/14/21 ?0051 09/15/21 ?0343 09/16/21 ?6546 09/17/21 ?0507  ?NA 127* 130* 136 138 141  ?K 3.6 3.3* 3.9 4.0 3.8  ?CL 87* 97* 104 108 114*  ?CO2 27 23 18* 19* 22  ?GLUCOSE 97 92 103* 92 128*  ?BUN 90* 77* 84* 82* 71*  ?CREATININE 3.22* 2.50* 2.69* 2.63* 2.20*  ?CALCIUM 9.3 7.8* 7.9* 8.5* 8.5*  ?MG  --   --  2.3 2.3 2.1  ?PHOS  --   --  9.8* 7.9* 4.4  ? ? ?Studies: ?ECHOCARDIOGRAM COMPLETE ? ?Result Date: 09/17/2021 ?   ECHOCARDIOGRAM REPORT   Patient Name:   Maria [name] Date of Exam: 09/16/2021 Medical Rec #:  [id number]     Height:       62.0 in Accession #:    [id number]    Weight:       123.5 lb Date of Birth:  1970/10/09     BSA:          1.557 m? Patient Age:    38 years      BP:           140/81 mmHg Patient Gender: F             HR:           66 bpm. Exam Location:  [facility] Procedure: 2D Echo Indications:     Elevated Troponin  History:         Patient has no prior history of Echocardiogram examinations.  Sonographer:     Kathlen Brunswick RDCS Referring Phys:  [id number] Val Riles Diagnosing Phys: Serafina Royals MD IMPRESSIONS   1. Left ventricular ejection fraction, by estimation, is 50 to 55%. The left ventricle has low normal function. The left ventricle has no regional wall motion abnormalities. Left ventricular diastolic parameters were normal.  2. Right ventricular systolic function is normal. The right ventricular size is normal.  3. The mitral valve is normal in structure. Mild mitral valve regurgitation.  4. The aortic valve is normal in structure. Aortic valve regurgitation is not visualized. FINDINGS  Left Ventricle: Left ventricular ejection fraction, by estimation, is 50 to 55%. The left ventricle has low normal function.  The left ventricle has no regional wall motion abnormalities. The left ventricular internal cavity size was normal in size. There is no left ventricular hypertrophy. Left ventricular diastolic parameters were normal. Right Ventricle: The right ventricular size is normal. No increase in right ventricular wall thickness. Right ventricular systolic function is normal. Left Atrium: Left atrial size was normal in size. Right Atrium: Right atrial size was normal in size. Pericardium: There is no evidence of pericardial effusion. Mitral Valve: The mitral valve is normal in structure. Mild mitral valve regurgitation. Tricuspid Valve: The tricuspid valve is normal in structure. Tricuspid valve regurgitation is mild. Aortic Valve: The aortic valve is normal in structure. Aortic valve regurgitation is not visualized. Aortic valve peak gradient measures 6.2 mmHg. Pulmonic Valve: The pulmonic valve was normal in structure. Pulmonic valve regurgitation is not visualized. Aorta: The aortic root and ascending aorta are structurally normal, with no evidence of dilitation. IAS/Shunts: No atrial level shunt detected by color flow Doppler.  LEFT VENTRICLE PLAX 2D LVIDd:         5.30 cm   Diastology LVIDs:         3.60 cm   LV e' medial:    7.40 cm/s LV PW:         1.10 cm   LV E/e' medial:  12.7 LV IVS:        0.90 cm   LV e'  lateral:   9.57 cm/s LVOT diam:     1.90 cm   LV E/e' lateral: 9.9 LV SV:         65 LV SV Index:   42 LVOT Area:     2.84 cm?  RIGHT VENTRICLE RV Basal diam:  2.80 cm RV S prime:     10.80 cm/s TAPSE (M-mode): 2.0 c

## 2021-09-17 NOTE — Consult Note (Signed)
Pharmacy Antibiotic Note ? ?Maria [name] is a 51 y.o. female with medical history including anxiety / depression, GERD / gastritis, EtOH use disorder admitted on 09/13/2021 with  perforated peptic ulcer . Patient is s/p ExLap with Phillip Heal patch repair of perforated ulcer and intra-abdominal abscess drainage on 3/15.  Pharmacy has been consulted for Zosyn dosing. Patient is also ordered fluconazole. ? ?Scr 3.22 >> 2.5 >> 2.69>> 2.63 >>2.20. Nephrology has been consulted. Patient currently receiving fluids. ? ?Plan: ? ?Continue Zosyn 3.375 g IV q8h (4-hr infusion) for Crcl 23.9 ml/min ? ? ? ?Height: '5\' 2"'$  (157.5 cm) ?Weight: 60 kg (132 lb 4.4 oz) ?IBW/kg (Calculated) : 50.1 ? ?Temp (24hrs), Avg:97.6 ?F (36.4 ?C), Min:97.4 ?F (36.3 ?C), Max:97.8 ?F (36.6 ?C) ? ?Recent Labs  ?Lab 09/13/21 ?1822 09/14/21 ?0051 09/14/21 ?0446 09/15/21 ?0343 09/16/21 ?9470 09/17/21 ?0507  ?WBC 20.2* 32.2*  --  15.4* 10.5 7.6  ?CREATININE 3.22* 2.50*  --  2.69* 2.63* 2.20*  ?LATICACIDVEN  --  1.2 0.7  --   --   --   ? ?  ?Estimated Creatinine Clearance: 23.9 mL/min (A) (by C-G formula based on SCr of 2.2 mg/dL (H)).   ? ?Allergies  ?Allergen Reactions  ? Anesthesia S-I-40 [Propofol]   ? Bee Venom Swelling  ? Codeine Swelling  ? Bupropion Anxiety and Other (See Comments)  ?  Worsened anxiety  ? ? ?Antimicrobials this admission: ?Zosyn 3/15 >>     (x5d) ?Diflucan 3/16 >>  ? ?Dose adjustments this admission: ?N/A ? ?Microbiology results: ?3/15 BCx: NGTD ?3/16 UCx: NG  ?3/16 MRSA PCR: (-) ? ?Thank you for allowing pharmacy to be a part of this patient?s care. ? ?Jackalynn Art A ?09/17/2021 8:57 AM ? ?

## 2021-09-17 NOTE — Anesthesia Preprocedure Evaluation (Signed)
Anesthesia Evaluation  ?Patient identified by MRN, date of birth, ID band ?Patient awake ? ? ? ?Reviewed: ?Allergy & Precautions, NPO status , Patient's Chart, lab work & pertinent test results ? ?History of Anesthesia Complications ?Negative for: history of anesthetic complications ? ?Airway ?Mallampati: III ? ? ?Neck ROM: Full ? ? ? Dental ? ?(+) Poor Dentition, Chipped, Missing,  ?  ?Pulmonary ?Current Smoker and Patient abstained from smoking.,  ?  ?Pulmonary exam normal ?breath sounds clear to auscultation ? ? ? ? ? ? Cardiovascular ?hypertension, Normal cardiovascular exam ?Rhythm:Regular Rate:Normal ? ?ECG 09/07/21:  ?Sinus rhythm ?Probable left atrial enlargement ?RSR' in V1 or V2, probably normal variant ?  ?Neuro/Psych ?PSYCHIATRIC DISORDERS Anxiety Depression negative neurological ROS ?   ? GI/Hepatic ?GERD  ,Perforated gastric ulcer ?  ?Endo/Other  ?Pancreatitis  ? Renal/GU ?negative Renal ROS  ? ?  ?Musculoskeletal ? ? Abdominal ?  ?Peds ? Hematology ?negative hematology ROS ?(+)   ?Anesthesia Other Findings ?Past Medical History: ?No date: Anemia ?09/13/2021: Anesthesia complication, initial encounter ?    Comment:  Difficulty waking up from Anesthesia requiring  ?             re-intubation ?No date: Anxiety ?    Comment:  h/o ?No date: Depression ?    Comment:  h/o ?No date: Family history of adverse reaction to anesthesia ?    Comment:  mom-hard time waking up ?08/19/2020: Gastritis ?No date: GERD (gastroesophageal reflux disease) ?    Comment:  tums prn ?No date: Hypertension ?    Comment:  WAS PUT ON BP MED BY PCP LAST YEAR (2018) AND BP MED  ?             MADE PT SICK SO SHE STOPPED TAKING IT-PCP MONITORS BP NOW ?05/18/2020: Palpitations ?No date: Pancreatitis ?11/13/2019: Poison ivy dermatitis ? ? Reproductive/Obstetrics ? ?  ? ? ? ? ? ? ? ? ? ? ? ? ? ?  ?  ? ? ? ? ? ? ? ? ?Anesthesia Physical ? ?Anesthesia Plan ? ?ASA: 3 and emergent ? ?Anesthesia Plan: General   ? ?Post-op Pain Management: Dilaudid IV  ? ?Induction: Intravenous and Rapid sequence ? ?PONV Risk Score and Plan: 4 or greater and Ondansetron, Dexamethasone, Treatment may vary due to age or medical condition and Midazolam ? ?Airway Management Planned: Oral ETT ? ?Additional Equipment:  ? ?Intra-op Plan:  ? ?Post-operative Plan: Extubation in OR and Possible Post-op intubation/ventilation ? ?Informed Consent: I have reviewed the patients History and Physical, chart, labs and discussed the procedure including the risks, benefits and alternatives for the proposed anesthesia with the patient or authorized representative who has indicated his/her understanding and acceptance.  ? ? ? ?Dental advisory given ? ?Plan Discussed with: CRNA ? ?Anesthesia Plan Comments: (Patient consented for risks of anesthesia including but not limited to:  ?- adverse reactions to medications ?- damage to eyes, teeth, lips or other oral mucosa ?- nerve damage due to positioning  ?- sore throat or hoarseness ?- damage to heart, brain, nerves, lungs, other parts of body or loss of life ? ?Informed patient about role of CRNA in peri- and intra-operative care.  Patient voiced understanding.)  ? ? ? ? ? ? ?Anesthesia Quick Evaluation ? ?

## 2021-09-17 NOTE — Anesthesia Postprocedure Evaluation (Signed)
Anesthesia Post Note ? ?Patient: Maria [name] ? ?Procedure(s) Performed: EXPLORATORY LAPAROTOMY ?REPAIR OF PERFORATED ULCER ? ?Patient location during evaluation: PACU ?Anesthesia Type: General ?Level of consciousness: awake and alert ?Pain management: pain level controlled ?Vital Signs Assessment: post-procedure vital signs reviewed and stable ?Respiratory status: spontaneous breathing, nonlabored ventilation, respiratory function stable and patient connected to nasal cannula oxygen ?Cardiovascular status: blood pressure returned to baseline and stable ?Postop Assessment: no apparent nausea or vomiting ?Anesthetic complications: no ? ? ?No notable events documented. ? ? ?Last Vitals:  ?Vitals:  ? 09/17/21 2258 09/17/21 2259  ?BP:    ?Pulse: 73 75  ?Resp: 10 15  ?Temp:    ?SpO2: 100% 99%  ?  ?Last Pain:  ?Vitals:  ? 09/17/21 2250  ?TempSrc:   ?PainSc: 0-No pain  ? ? ?  ?  ?  ?  ?  ?  ? ?Arita Miss ? ? ? ? ?

## 2021-09-17 NOTE — Progress Notes (Signed)
09/17/2021 ? ?Subjective: ?Patient is 4 Days Post-Op status post exploratory laparotomy with repair of perforated gastric ulcer with Phillip Heal patch and drainage of intra-abdominal abscess.  She was transferred to the floor yesterday and has been doing well.  She mainly reports that she feels very weak at this point both in energy and strength.  Denies any significant or worsening abdominal pain. ? ?Vital signs: ?Temp:  [97.4 ?F (36.3 ?C)-97.8 ?F (36.6 ?C)] 97.6 ?F (36.4 ?C) (03/19 0813) ?Pulse Rate:  [62-72] 70 (03/19 0813) ?Resp:  [16-20] 18 (03/19 0813) ?BP: (126-141)/(68-77) 126/77 (03/19 0813) ?SpO2:  [98 %-100 %] 98 % (03/19 0813) ?Weight:  [60 kg] 60 kg (03/19 0447)  ? ?Intake/Output: ?03/18 0701 - 03/19 0700 ?In: 573.7 [I.V.:356.2; IV Piggyback:217.5] ?Out: 1215 [Urine:1200; Drains:15] ?  ? ?Physical Exam: ?Constitutional: No acute distress ?Abdomen: Soft, nondistended, appropriately tender to palpation.  Midline incision is clean, dry, intact with staples in place.  JP drain with serosanguineous fluid. ? ?Labs:  ?Recent Labs  ?  09/16/21 ?6063 09/17/21 ?0507  ?WBC 10.5 7.6  ?HGB 8.2* 8.4*  ?HCT 24.2* 26.1*  ?PLT 124* 130*  ? ?Recent Labs  ?  09/16/21 ?0160 09/17/21 ?0507  ?NA 138 141  ?K 4.0 3.8  ?CL 108 114*  ?CO2 19* 22  ?GLUCOSE 92 128*  ?BUN 82* 71*  ?CREATININE 2.63* 2.20*  ?CALCIUM 8.5* 8.5*  ? ?Recent Labs  ?  09/17/21 ?0507  ?LABPROT 19.8*  ?INR 1.7*  ? ? ?Imaging: ?ECHOCARDIOGRAM COMPLETE ? ?Result Date: 09/17/2021 ?   ECHOCARDIOGRAM REPORT   Patient Name:   JANARA KLETT Date of Exam: 09/16/2021 Medical Rec #:  [id number]     Height:       62.0 in Accession #:    [id number]    Weight:       123.5 lb Date of Birth:  December 26, 1970     BSA:          1.557 m? Patient Age:    51 years      BP:           140/81 mmHg Patient Gender: F             HR:           66 bpm. Exam Location:  [facility] Procedure: 2D Echo Indications:     Elevated Troponin  History:         Patient has no prior history of Echocardiogram  examinations.  Sonographer:     Kathlen Brunswick RDCS Referring Phys:  [id number] Val Riles Diagnosing Phys: Serafina Royals MD IMPRESSIONS  1. Left ventricular ejection fraction, by estimation, is 50 to 55%. The left ventricle has low normal function. The left ventricle has no regional wall motion abnormalities. Left ventricular diastolic parameters were normal.  2. Right ventricular systolic function is normal. The right ventricular size is normal.  3. The mitral valve is normal in structure. Mild mitral valve regurgitation.  4. The aortic valve is normal in structure. Aortic valve regurgitation is not visualized. FINDINGS  Left Ventricle: Left ventricular ejection fraction, by estimation, is 50 to 55%. The left ventricle has low normal function. The left ventricle has no regional wall motion abnormalities. The left ventricular internal cavity size was normal in size. There is no left ventricular hypertrophy. Left ventricular diastolic parameters were normal. Right Ventricle: The right ventricular size is normal. No increase in right ventricular wall thickness. Right ventricular systolic function is normal. Left Atrium: Left atrial size was normal  in size. Right Atrium: Right atrial size was normal in size. Pericardium: There is no evidence of pericardial effusion. Mitral Valve: The mitral valve is normal in structure. Mild mitral valve regurgitation. Tricuspid Valve: The tricuspid valve is normal in structure. Tricuspid valve regurgitation is mild. Aortic Valve: The aortic valve is normal in structure. Aortic valve regurgitation is not visualized. Aortic valve peak gradient measures 6.2 mmHg. Pulmonic Valve: The pulmonic valve was normal in structure. Pulmonic valve regurgitation is not visualized. Aorta: The aortic root and ascending aorta are structurally normal, with no evidence of dilitation. IAS/Shunts: No atrial level shunt detected by color flow Doppler.  LEFT VENTRICLE PLAX 2D LVIDd:         5.30 cm    Diastology LVIDs:         3.60 cm   LV e' medial:    7.40 cm/s LV PW:         1.10 cm   LV E/e' medial:  12.7 LV IVS:        0.90 cm   LV e' lateral:   9.57 cm/s LVOT diam:     1.90 cm   LV E/e' lateral: 9.9 LV SV:         65 LV SV Index:   42 LVOT Area:     2.84 cm?  RIGHT VENTRICLE RV Basal diam:  2.80 cm RV S prime:     10.80 cm/s TAPSE (M-mode): 2.0 cm LEFT ATRIUM             Index        RIGHT ATRIUM          Index LA Vol (A2C):   65.3 ml 41.93 ml/m?  RA Area:     9.00 cm? LA Vol (A4C):   34.5 ml 22.15 ml/m?  RA Volume:   17.30 ml 11.11 ml/m? LA Biplane Vol: 50.9 ml 32.68 ml/m?  AORTIC VALVE                 PULMONIC VALVE AV Area (Vmax): 2.21 cm?     PV Vmax:       0.84 m/s AV Vmax:        124.00 cm/s  PV Peak grad:  2.8 mmHg AV Peak Grad:   6.2 mmHg LVOT Vmax:      96.60 cm/s LVOT Vmean:     64.000 cm/s LVOT VTI:       0.231 m  AORTA Ao Root diam: 2.80 cm Ao Asc diam:  2.70 cm MITRAL VALVE               TRICUSPID VALVE MV Area (PHT): 3.74 cm?    TV Peak grad:   21.8 mmHg MV Decel Time: 203 msec    TV Vmax:        2.33 m/s MV E velocity: 94.30 cm/s MV A velocity: 75.40 cm/s  SHUNTS MV E/A ratio:  1.25        Systemic VTI:  0.23 m                            Systemic Diam: 1.90 cm Serafina Royals MD Electronically signed by Serafina Royals MD Signature Date/Time: 09/17/2021/9:25:54 AM    Final    ? ?Assessment/Plan: ?This is a 51 y.o. female s/p exploratory laparotomy with repair of perforated gastric ulcer with Phillip Heal patch. ? ?- Patient scheduled to have an upper GI study today with interventional radiology for evaluation of  the repair.  If study is normal, then we will be able to start the patient on clear liquid diet today. ?- We will place PT consult order to help patient with deconditioning and weakness to determine her needs upon discharge. ?- Continue antibiotics for perforated ulcer. ?- Creatinine is slowly improving.  Nephrology is on board.  Appreciate their assistance.  Continue IV fluid hydration for  now. ? ? ?Melvyn Neth, MD ?[facility]  ?

## 2021-09-17 NOTE — Progress Notes (Signed)
09/17/21 ? ?UGI done today showed a possible contained leak from the site of prior perforation repair on 3/15.  CT scan of abdomen was ordered to confirm the study and this found an actual uncontained leak, from the same perforation site, with contrast extravasation and air/fluid collection. ? ?Have discussed with the patient and her husband, and we'll be taking her back to the OR emergently tonight for exploratory laparotomy.  Discussed possibility of perforation repair again but also the possibility of excision and gastrointestinal anastomosis.  She understands the need for emergent surgery and she's willing to proceed. ? ?Dr. Lysle Pearl informed of situation and he'll be coming in to help in the OR. ? ? ?[name], MD ? ?

## 2021-09-17 NOTE — Op Note (Addendum)
Preoperative diagnosis: perforated peptic ulcer ?  ?Postoperative diagnosis: same ?  ?Procedure:   ?exploratory laparotomy  ?primary closure of perforated peptic ulcer Graham patch repair,  ?intrabdominal abscess drainage,  ?Gatrostomy tube insertion ?Jejunostomy tube insertion ? ? ?Anesthesia: GETA ?  ?Surgeon: [name] ?Assistant: Piscoya for exposure and retraction ?  ?Wound Classification: clean contaminated ?  ?Specimen: none ?  ?Complications: None ?  ?Estimated Blood Loss: 330m ?  ?Indications:see HPI ?  ?Findings: ?Persistent Infection of the abdomen due to non-healing perforated peptic ulcer  ?Primary closure of ulcer, GPhillip Healpatch repair  ?Adequate hemostasis ?G tube and J tube placed due to inability to palpate NG and concern for long term enteral feeding bypassing repair site ?  ?Description of procedure: The patient was brought to the operating room and general anesthesia was induced. A time-out was completed verifying correct patient, procedure, site, positioning, and implant(s) and/or special equipment prior to beginning this procedure. Antibiotics were administered prior to making the incision. SCDs placed. The anterior abdominal wall was prepped and draped in the standard sterile fashion.  ?  ?Midline subxiphoid incision was opened again. Dr. PHampton Abbotassisted for exposure and retraction. Extensive inflammatory response with exudative buildup and soft adhesions again noted throughout abdomen, approximating liver, omentum, stomach, and transverse colon. Gentle retraction of the enlarged liver and extremely thick and inflamed omentum visualized the previous GPhillip Healpatch repair is still intact covering the area of repair.  Finger was gently passed underneath this area and the [facility] felt still intact from the sutures used to anchor it down.  The anchor sutures were then cut and the previous perforation was noted to continue to be leaking scant amount of bile.   ? ?Additional exploration of the  area did not note any other perforations or injury to the stomach.  Dissection towards the right side near the pylorus encountered a huge pocket of fluid consistent with the leaked contrast seen on CT scan.  This was all suctioned out and the fluid was noted to continue over the right lobe of the liver towards the right upper quadrant where there was a pocket of purulent drainage.  This was all drained completely as well. Infection and abscess present in the abdomen ?  ?Exploration towards the GE junction and attempt to palpate the NG tube placed by anesthesia was unsuccessful due to the very thick tissue.  Bleeding in the left upper quadrant was eventually controlled with a combination of packing, Vistaseal, SNO, and cauterization of the anterior abdominal wall where the bleeding was noted along with edges of the inflamed omentum.  The stomach underlying the inflamed omentum and exudative buildup still looked pink and viable around the perforation, holding sutures in place, therefore primary repair and GPhillip Healpatch was reattempted since the anatomical location of the perforation would make resection extremely difficult, especially given the overall inflamed and infected field beyond the immediate area of the ulceration. ? ?The tissue around it again was enough to hold a 3-0 silk suture so primary closure using interrupted 3-0 silk suture x3 completed.  Part of the adjacent omentum was then trimmed and healthy omentum was secured as a Graham patch over the primary repair with additional 3-0 silk x4 in an interrupted fashion circumferentially.  Surrounding area was then extensively suctioned and irrigated until clear output was noted.  No evidence of active bleeding noted.  ? ?Due to the inability of confirm proper NG placement Intra-Op for the second time, decision was made to proceed  with G-tube placement for definitive stomach decompression while the repair is healing as well as placement of a J-tube for long-term  enteral feeding if needed.   ? ?G-tube placement in the left upper quadrant skin was chosen and an incision made through the abdominal wall.  A 14 French G-tube was then placed through this defect and inserted into the stomach after placing a gastrostomy inside a 3-0 silk suture pursestring area.  After confirming balloon expanded and snug to the gastric wall, pursestring suture was tightened to secure the tube in place.  The stomach along with the tube was then approximated to the anterior abdominal wall and pexied using additional 3-0 silk suture x4.   ? ?Adjacent to the previous JP insertion site, a skin incision was made to accommodate the J-tube.  24 French Foley catheter acting as the J-tube was then placed through the abdominal wall and a point of small bowel immediately below the J-tube insertion site was chosen.  Pursestring suture with 3-0 silk was placed within the bowel and an enterotomy was made within it.  75 French Foley catheter passed distally and secured in place by tying the pursestring suture down.  The visible catheter atop the bowel was then wrapped around with the adjacent small bowel wall in a Lembert fashion using interrupted 3-0 silk, to create a tract.  The small bowel and newly created track was then approximated where the Foley catheter penetrated the abdominal wall and pexied to the area using 3-0 silk x2. ? ?Previously placed 15 [place] round drain was then placed over the repair. ? ?Hemostasis was again noted prior to infusing the midline incision with anesthesia.  Midline incision was then closed with 1 PDS x2, skin approximated using interrupted 3-0 Vicryl, closed with staples.  Incision then dressed with honeycomb dressing, drain sponge placed underneath G-tube bumper, noting the tube inserting into the abdominal wall at the 4 cm mark.  Drain sponge covered the JP site, additional drain sponges then wrapped around the J-tube site as well and secured to place using paper tape.   ? ?Patient was then successfully awakened and transferred to PACU in stable condition.  Patient noted to be responsive to verbal command and answering questions appropriately.  At the end of the procedure sponge and instrument counts were correct.  Transferred to PACU, Foley still in place. ?

## 2021-09-18 ENCOUNTER — Inpatient Hospital Stay: Payer: Self-pay

## 2021-09-18 ENCOUNTER — Encounter: Payer: Self-pay | Admitting: Surgery

## 2021-09-18 DIAGNOSIS — N179 Acute kidney failure, unspecified: Secondary | ICD-10-CM | POA: Diagnosis present

## 2021-09-18 DIAGNOSIS — F1010 Alcohol abuse, uncomplicated: Secondary | ICD-10-CM | POA: Diagnosis present

## 2021-09-18 DIAGNOSIS — F1090 Alcohol use, unspecified, uncomplicated: Secondary | ICD-10-CM | POA: Diagnosis present

## 2021-09-18 DIAGNOSIS — K275 Chronic or unspecified peptic ulcer, site unspecified, with perforation: Secondary | ICD-10-CM | POA: Diagnosis not present

## 2021-09-18 LAB — MAGNESIUM: Magnesium: 1.6 mg/dL — ABNORMAL LOW (ref 1.7–2.4)

## 2021-09-18 LAB — PROTIME-INR
INR: 1.9 — ABNORMAL HIGH (ref 0.8–1.2)
Prothrombin Time: 22.1 seconds — ABNORMAL HIGH (ref 11.4–15.2)

## 2021-09-18 LAB — CBC
HCT: 20 % — ABNORMAL LOW (ref 36.0–46.0)
Hemoglobin: 6.4 g/dL — ABNORMAL LOW (ref 12.0–15.0)
MCH: 36 pg — ABNORMAL HIGH (ref 26.0–34.0)
MCHC: 32 g/dL (ref 30.0–36.0)
MCV: 112.4 fL — ABNORMAL HIGH (ref 80.0–100.0)
Platelets: 124 10*3/uL — ABNORMAL LOW (ref 150–400)
RBC: 1.78 MIL/uL — ABNORMAL LOW (ref 3.87–5.11)
RDW: 13.5 % (ref 11.5–15.5)
WBC: 14.1 10*3/uL — ABNORMAL HIGH (ref 4.0–10.5)
nRBC: 0 % (ref 0.0–0.2)

## 2021-09-18 LAB — BASIC METABOLIC PANEL
Anion gap: 7 (ref 5–15)
BUN: 51 mg/dL — ABNORMAL HIGH (ref 6–20)
CO2: 21 mmol/L — ABNORMAL LOW (ref 22–32)
Calcium: 7.8 mg/dL — ABNORMAL LOW (ref 8.9–10.3)
Chloride: 115 mmol/L — ABNORMAL HIGH (ref 98–111)
Creatinine, Ser: 1.49 mg/dL — ABNORMAL HIGH (ref 0.44–1.00)
GFR, Estimated: 42 mL/min — ABNORMAL LOW (ref >60)
Glucose, Bld: 207 mg/dL — ABNORMAL HIGH (ref 70–99)
Potassium: 4.5 mmol/L (ref 3.5–5.1)
Sodium: 143 mmol/L (ref 135–145)

## 2021-09-18 LAB — HEPATIC FUNCTION PANEL
ALT: 13 U/L (ref 0–44)
AST: 21 U/L (ref 15–41)
Albumin: 1.7 g/dL — ABNORMAL LOW (ref 3.5–5.0)
Alkaline Phosphatase: 160 U/L — ABNORMAL HIGH (ref 38–126)
Bilirubin, Direct: 0.6 mg/dL — ABNORMAL HIGH (ref 0.0–0.2)
Indirect Bilirubin: 0.8 mg/dL (ref 0.3–0.9)
Total Bilirubin: 1.4 mg/dL — ABNORMAL HIGH (ref 0.3–1.2)
Total Protein: 4.7 g/dL — ABNORMAL LOW (ref 6.5–8.1)

## 2021-09-18 LAB — CULTURE, BLOOD (ROUTINE X 2)
Culture: NO GROWTH
Culture: NO GROWTH
Special Requests: ADEQUATE

## 2021-09-18 LAB — AMMONIA: Ammonia: 19 umol/L (ref 9–35)

## 2021-09-18 LAB — GLUCOSE, CAPILLARY
Glucose-Capillary: 117 mg/dL — ABNORMAL HIGH (ref 70–99)
Glucose-Capillary: 146 mg/dL — ABNORMAL HIGH (ref 70–99)
Glucose-Capillary: 157 mg/dL — ABNORMAL HIGH (ref 70–99)
Glucose-Capillary: 157 mg/dL — ABNORMAL HIGH (ref 70–99)
Glucose-Capillary: 191 mg/dL — ABNORMAL HIGH (ref 70–99)
Glucose-Capillary: 219 mg/dL — ABNORMAL HIGH (ref 70–99)
Glucose-Capillary: 225 mg/dL — ABNORMAL HIGH (ref 70–99)

## 2021-09-18 LAB — PREPARE RBC (CROSSMATCH)

## 2021-09-18 LAB — SURGICAL PATHOLOGY

## 2021-09-18 LAB — PHOSPHORUS: Phosphorus: 4.8 mg/dL — ABNORMAL HIGH (ref 2.5–4.6)

## 2021-09-18 LAB — LIPASE, BLOOD: Lipase: 23 U/L (ref 11–51)

## 2021-09-18 MED ORDER — MAGNESIUM SULFATE 2 GM/50ML IV SOLN
2.0000 g | Freq: Once | INTRAVENOUS | Status: AC
  Administered 2021-09-18: 2 g via INTRAVENOUS
  Filled 2021-09-18: qty 50

## 2021-09-18 MED ORDER — SODIUM CHLORIDE 0.9% IV SOLUTION: Freq: Once | INTRAVENOUS | Status: AC

## 2021-09-18 MED ORDER — INSULIN ASPART 100 UNIT/ML IJ SOLN
0.0000 [IU] | INTRAMUSCULAR | Status: DC
  Administered 2021-09-18: 2 [IU] via SUBCUTANEOUS
  Administered 2021-09-18: 3 [IU] via SUBCUTANEOUS
  Administered 2021-09-18: 1 [IU] via SUBCUTANEOUS
  Administered 2021-09-18 – 2021-09-19 (×3): 2 [IU] via SUBCUTANEOUS
  Filled 2021-09-18 (×6): qty 1

## 2021-09-18 MED ORDER — MAGNESIUM SULFATE 2 GM/50ML IV SOLN: 2.0000 g | Freq: Once | INTRAVENOUS | Status: DC

## 2021-09-18 MED ORDER — SODIUM CHLORIDE 0.9% FLUSH: 10.0000 mL | INTRAVENOUS | Status: DC | PRN

## 2021-09-18 MED ORDER — FLUCONAZOLE IN SODIUM CHLORIDE 200-0.9 MG/100ML-% IV SOLN
200.0000 mg | Freq: Once | INTRAVENOUS | Status: AC
  Administered 2021-09-18: 200 mg via INTRAVENOUS
  Filled 2021-09-18: qty 100

## 2021-09-18 MED ORDER — CHLORHEXIDINE GLUCONATE CLOTH 2 % EX PADS
6.0000 | MEDICATED_PAD | Freq: Every day | CUTANEOUS | Status: DC
  Administered 2021-09-18 – 2021-09-28 (×11): 6 via TOPICAL

## 2021-09-18 MED ORDER — SODIUM CHLORIDE 0.9% IV SOLUTION
Freq: Once | INTRAVENOUS | Status: AC
Start: 2021-09-18 — End: 2021-09-18

## 2021-09-18 MED ORDER — DEXTROSE-NACL 5-0.9 % IV SOLN: INTRAVENOUS | Status: DC

## 2021-09-18 MED ORDER — SODIUM CHLORIDE 0.9% FLUSH
10.0000 mL | Freq: Two times a day (BID) | INTRAVENOUS | Status: DC
  Administered 2021-09-18: 20 mL
  Administered 2021-09-20: 10 mL
  Administered 2021-09-21: 20 mL
  Administered 2021-09-21 – 2021-10-03 (×21): 10 mL
  Administered 2021-10-04: 20 mL
  Administered 2021-10-05: 10 mL
  Administered 2021-10-05: 20 mL
  Administered 2021-10-05: 10 mL
  Administered 2021-10-06: 20 mL
  Administered 2021-10-06: 10 mL
  Administered 2021-10-07: 20 mL
  Administered 2021-10-07 – 2021-10-08 (×2): 10 mL
  Administered 2021-10-08: 30 mL
  Administered 2021-10-09: 10 mL
  Administered 2021-10-09: 30 mL
  Administered 2021-10-10: 10 mL

## 2021-09-18 MED ORDER — TRAVASOL 10 % IV SOLN
INTRAVENOUS | Status: AC
  Filled 2021-09-18: qty 445.2

## 2021-09-18 NOTE — Consult Note (Signed)
PHARMACY - TOTAL PARENTERAL NUTRITION CONSULT NOTE  ? ?Indication: Prolonged ileus ? ?Patient Measurements: ?Height: '5\' 2"'$  (157.5 cm) ?Weight: 46 kg (101 lb 6.6 oz) ?IBW/kg (Calculated) : 50.1 ?TPN AdjBW (KG): 53.5 ?Body mass index is 18.55 kg/m?. ?Usual Weight: 55kg ? ?Assessment: [name] POD4 from [facility] repair of non-healing perforated peptic ulcer c/b prolonged ileus. GJ tube placed 3/19 and Pharmacy consulted for mgmt of TPN and electrolytes. ? ?Glucose / Insulin: 90-200s (no SSI currently) ?Electrolytes:  ?K: 3.8>4.5 ?Mg 2.1>1.6 ?Albumin: 1.7; corrected calcium 9.7 (WNL) ? ?Renal: Scr 2.63>2.2>1.49 ?Hepatic:  ?Intake / Output; MIVF: NS KVO 10-23m/hr , D5NS '@100ml'$ /hr (24027md) ?GI Imaging: ?3/19 CT abd -- Anterior antral gastric wall perforation with associated moderate volume pneumoperitoneum, small volume ascites, extravasated PO contrast. Colonic diverticulosis & Chronic pancreatitis. ? ?3/16 CT abd/pelvis: POD1 Decreased pneumoperitoneum, Graham patch repair of the perforated ?distal stomach, postoperative drain at the greater omentum. Small volume mostly simple density fluid in the abdomen is primarily around the liver. No bowel obstruction. Chronic calcific pancreatitis ? ?GI Surgeries / Procedures:  ? ?Central access:  ?TPN start date:  ? ?Nutritional Goals: ?Goal TPN rate is 70 mL/hr (provides 89 g of protein and 1716 kcals per day) ? ?RD Assessment: ?Estimated Needs ?Total Energy Estimated Needs: 1600-1800kcal/day ?Total Protein Estimated Needs: 80-90g/day ?Total Fluid Estimated Needs: 1.6-1.8L/day ? ?Current Nutrition:  ?NPO ? ?Plan:  ?Start TPN at half-rate 35 mL/hr at 1800 ?Electrolytes in TPN: Na 502mL, K 23m60m, Ca 5mEq23m Mg 5mEq/85mand Phos 15mmol59mCl:Ac 1:1 ?Add standard MVI and trace elements to TPN ?Add thiamine '100mg'$  daily x3d (d1/3d) ?Initiate Sensitive q4h SSI and adjust as needed  ?Reduce MIVF to 100>65 mL/hr at 1800 when TPN starts, further reduce tomorrow once at goal  rate ?Monitor TPN labs on Mon/Thurs ? ?BrandonShanon Brow?09/18/2021,8:07 AM ? ?

## 2021-09-18 NOTE — Assessment & Plan Note (Signed)
Vitamin D level at 11.5. ?-We will start vitamin D 50,000 units p.o. weekly once able to take p.o. ?

## 2021-09-18 NOTE — Hospital Course (Addendum)
51 y.o. female with past medical history of hypertension, depression, alcohol abuse with secondary pancreatitis admitted on 09/13/2021 under general surgery due to pneumoperitoneum secondary to perforated peptic ulcer.  Patient was taken to the OR s/p ex lap with primary closure of perforated peptic ulcer with Phillip Heal patch repair with intra-abdominal abscess drainage.  Postop, patient became unresponsive so code stroke was called but work-up was negative.  Patient was in the ICU, downgraded to the medical floor on 09/16/2021 to hospitalist service. ? ?Patient underwent another exploratory laparotomy on 09/17/2021 when peptic ulcer perforation repair was redone along with G and J-tube placement, intra-abdominal abscess was also drained.  Started on TPN.  By 3/25, alkaline phosphatase and total bili is starting to trend upward and hemoglobin trending downward.  Patient transfused blood.  CT of abdomen on 3/28 notes small persistent leak with concern of subcapsular hematoma.  IR placed a percutaneous drain on 3/28.  General surgery assumed care.  On 4/1, patient transitioned from TPN to tube feeds.  Hospitalists were reconsulted for persistent anemia on 4/5. ? ? ?

## 2021-09-18 NOTE — Progress Notes (Signed)
Subjective:  ?CC: ?Maria [name] is a 51 y.o. female  Hospital stay day 4, 1 Day Post-Op exploratory laparotomy, redo peptic ulcer perforation repair, G and J tube placement. ? ?HPI: ?No acute issues overnight.  Sore but tolerable. ? ?ROS:  ?General: Denies weight loss, weight gain, fatigue, fevers, chills, and night sweats. ?Heart: Denies chest pain, palpitations, racing heart, irregular heartbeat, leg pain or swelling, and decreased activity tolerance. ?Respiratory: Denies breathing difficulty, shortness of breath, wheezing, cough, and sputum. ?GI: Denies change in appetite, heartburn, nausea, vomiting, constipation, diarrhea, and blood in stool. ?GU: Denies difficulty urinating, pain with urinating, urgency, frequency, blood in urine. ? ? ?Objective:  ? ?Temp:  [97 ?F (36.1 ?C)-98.5 ?F (36.9 ?C)] 97.6 ?F (36.4 ?C) (03/20 1001) ?Pulse Rate:  [63-79] 67 (03/20 1001) ?Resp:  [9-20] 16 (03/20 1001) ?BP: (106-129)/(56-83) 122/56 (03/20 1001) ?SpO2:  [97 %-100 %] 100 % (03/20 1001) ?Weight:  [46 kg] 46 kg (03/20 0500)     Height: '5\' 2"'$  (157.5 cm) Weight: 46 kg BMI (Calculated): 18.54  ? ?Intake/Output this shift:  ? ?Intake/Output Summary (Last 24 hours) at 09/18/2021 1150 ?Last data filed at 09/18/2021 9518 ?Gross per 24 hour  ?Intake 3065.32 ml  ?Output 2465 ml  ?Net 600.32 ml  ? ? ?Constitutional :  alert, cooperative, appears stated age, and no distress  ?Respiratory:  clear to auscultation bilaterally  ?Cardiovascular:  regular rate and rhythm  ?Gastrointestinal: Soft, some TTP around incision, no guarding JP with serosanguinous drainage, NG with bilious output .   ?Skin: Cool and moist. Staples c/d/i  ?Psychiatric: Normal affect, non-agitated, not confused  ?   ?  ?LABS:  ?CMP Latest Ref Rng & Units 09/18/2021 09/17/2021 09/16/2021  ?Glucose 70 - 99 mg/dL 207(H) 128(H) 92  ?BUN 6 - 20 mg/dL 51(H) 71(H) 82(H)  ?Creatinine 0.44 - 1.00 mg/dL 1.49(H) 2.20(H) 2.63(H)  ?Sodium 135 - 145 mmol/L 143 141 138  ?Potassium 3.5 -  5.1 mmol/L 4.5 3.8 4.0  ?Chloride 98 - 111 mmol/L 115(H) 114(H) 108  ?CO2 22 - 32 mmol/L 21(L) 22 19(L)  ?Calcium 8.9 - 10.3 mg/dL 7.8(L) 8.5(L) 8.5(L)  ?Total Protein 6.5 - 8.1 g/dL 4.7(L) 5.6(L) 5.3(L)  ?Total Bilirubin 0.3 - 1.2 mg/dL 1.4(H) 1.2 1.3(H)  ?Alkaline Phos 38 - 126 U/L 160(H) 236(H) 212(H)  ?AST 15 - 41 U/L '21 23 20  '$ ?ALT 0 - 44 U/L '13 15 14  '$ ? ?CBC Latest Ref Rng & Units 09/18/2021 09/17/2021 09/16/2021  ?WBC 4.0 - 10.5 K/uL 14.1(H) 7.6 10.5  ?Hemoglobin 12.0 - 15.0 g/dL 6.4(L) 8.4(L) 8.2(L)  ?Hematocrit 36.0 - 46.0 % 20.0(L) 26.1(L) 24.2(L)  ?Platelets 150 - 400 K/uL 124(L) 130(L) 124(L)  ? ? ?RADS: ?N/a ?Assessment:  ? ?S/p exploratory laparotomy, redo peptic ulcer perforation repair, G and J tube placement. Due to persistent leak after intial graham patch repair ? ?Hgb noted to be lower today as expected from EBL intraop, INR continues to increase.  2u pRBC and FFP transfusion pending.  No evidence of continued bleeding from JP output and clinical exam, but will have to continue to monitor closely.   ? ?Due to persistent leak, will continue with bowel rest for at least another week.  TPN, PICC placement ordered.  Continue IV ABX and antifungal for intra-abdominal infection coverage.  Foley for another day for strict I&Os. ? ? ? ? ?labs/images/medications/previous chart entries reviewed personally and relevant changes/updates noted above. ? ? ?

## 2021-09-18 NOTE — Assessment & Plan Note (Signed)
With pneumoperitoneum and intra-abdominal abscesses s/p exploratory laparotomy twice as first time she was continued to have leak.  First surgery was on 09/13/2021 and second was on 09/17/2021. ?-Patient will remain n.p.o. for bowel rest for another week ?-NG tube in place ?-Surgery ordered PICC line and starting TPN ?-Continue with Zosyn and Diflucan-being managed by surgery. ?-Continue with pain management and supportive care ?

## 2021-09-18 NOTE — Progress Notes (Signed)
?Progress Note ? ? ?Patient: Maria [name] VCB:[id number] DOB: [birth year] DOA: 09/13/2021     4 ?DOS: the patient was seen and examined on 09/18/2021 ?  ?Brief hospital course: ?Taken from prior notes. ? ?Maria [name] is a 51 y.o. female with PMH of HTN, pancreatitis, GERD/gastritis, anxiety, depression, alcohol abuse presented at [facility] ED with abdominal pain.  Patient was admitted on 09/13/2021 under general surgery due to pneumoperitoneum secondary to perforated peptic ulcer, patient was taken to the OR s/p ex lap with primary closure of perforated peptic ulcer with Phillip Heal patch repair with intra-abdominal abscess drainage.  Postop patient became unresponsive so code stroke was called but work-up was negative.  Patient was in the ICU, downgraded on the medical floor on 09/16/2021. ? ?Patient underwent another exploratory laparotomy yesterday on 09/17/2021 when peptic ulcer perforation repair was redone along with G and J-tube placement, intra-abdominal abscess was also drained. ? ?NG tube in place. ?Patient was started on TPN today, 09/18/2021 as surgery would like to give her a bowel rest for at least for 1 week. ? ?Foley catheter in place-surgery wants to keep for another 1 to 2 days. ?PICC line was placed on 09/18/2021 to start TPN ? ? ?Assessment and Plan: ?* Perforated peptic ulcer ([place]) ?With pneumoperitoneum and intra-abdominal abscesses s/p exploratory laparotomy twice as first time she was continued to have leak.  First surgery was on 09/13/2021 and second was on 09/17/2021. ?-Patient will remain n.p.o. for bowel rest for another week ?-NG tube in place ?-Surgery ordered PICC line and starting TPN ?-Continue with Zosyn and Diflucan-being managed by surgery. ?-Continue with pain management and supportive care ? ?Malnutrition of moderate degree ?Estimated body mass index is 18.55 kg/m? as calculated from the following: ?  Height as of this encounter: '5\' 2"'$  (1.575 m). ?  Weight as of this encounter: 46 kg.   ? ?-Patient is being started on TPN ? ?Essential hypertension ?Blood pressure within goal.  Echocardiogram normal. ?-Continue with as needed hydralazine ? ?AKI (acute kidney injury) ([place]) ?Creatinine continued to improve, at 1.49 today. ?-Monitor renal function ?-Avoid nephrotoxins ? ?Vitamin D deficiency ?Vitamin D level at 11.5. ?-We will start vitamin D 50,000 units p.o. weekly once able to take p.o. ? ?Alcohol abuse ?No withdrawal symptoms. ?-Continue with IV folic acid and thiamine.  We will switch to p.o. once able to take  Oral. ?-Continue with CIWA protocol ? ?  ? ?Subjective: Patient was seen and examined today.  Complaining about some belly pain.  NG tube in place. ? ?Physical Exam: ?Vitals:  ? 09/18/21 1615 09/18/21 1725 09/18/21 1740 09/18/21 1755  ?BP: 132/63 120/77 120/77 129/75  ?Pulse: 64 64 64 63  ?Resp: '16 16 16 16  '$ ?Temp: (!) 97.3 ?F (36.3 ?C) 97.8 ?F (36.6 ?C) 97.8 ?F (36.6 ?C) 97.7 ?F (36.5 ?C)  ?TempSrc: Axillary Axillary Axillary Axillary  ?SpO2: 100% 100% 100% 99%  ?Weight:      ?Height:      ? ?General.  Ill-appearing lady, in no acute distress.  NG tube in place ?Pulmonary.  Lungs clear bilaterally, normal respiratory effort. ?CV.  Regular rate and rhythm, no JVD, rub or murmur. ?Abdomen.  Soft, mild diffuse tenderness, nondistended, BS positive. ?CNS.  Alert and oriented .  No focal neurologic deficit. ?Extremities.  No edema, no cyanosis, pulses intact and symmetrical. ?Psychiatry.  Judgment and insight appears normal. ? ?Data Reviewed: ?I personally reviewed prior notes, labs and images. ? ?Family Communication: Discussed with patient ? ?Disposition: ?Status  is: Inpatient ?Remains inpatient appropriate because: Severity of illness ? ? Planned Discharge Destination: Home ? ?Time spent: 50 minutes ? ?This record has been created using Systems analyst. Errors have been sought and corrected,but may not always be located. Such creation errors do not reflect on the standard  of care. ? ?Author: ?Lorella Nimrod, MD ?09/18/2021 5:59 PM ? ?For on call review [email or web address].  ?

## 2021-09-18 NOTE — Assessment & Plan Note (Signed)
Estimated body mass index is 18.55 kg/m? as calculated from the following: ?  Height as of this encounter: '5\' 2"'$  (1.575 m). ?  Weight as of this encounter: 46 kg.  ? ?-Patient is being started on TPN ?

## 2021-09-18 NOTE — Plan of Care (Signed)
  Problem: Coping: Goal: Level of anxiety will decrease Outcome: Progressing   Problem: Elimination: Goal: Will not experience complications related to urinary retention Outcome: Progressing   Problem: Safety: Goal: Ability to remain free from injury will improve Outcome: Progressing   

## 2021-09-18 NOTE — Progress Notes (Signed)
?[facility]  ?PROGRESS NOTE  ? ?Subjective:  ? ?Patient seen resting quietly ?Alert and oriented ?Husband at bedside ?States she feels tired today ? ?NGT in place to suction ?Foley in place ? ?Objective:  ?Vital signs: ?Blood pressure (!) 122/56, pulse 67, temperature 97.6 ?F (36.4 ?C), temperature source Axillary, resp. rate 16, height '5\' 2"'$  (1.575 m), weight 46 kg, last menstrual period 02/24/2018, SpO2 100 %. ? ?Intake/Output Summary (Last 24 hours) at 09/18/2021 1316 ?Last data filed at 09/18/2021 0865 ?Gross per 24 hour  ?Intake 3065.32 ml  ?Output 2465 ml  ?Net 600.32 ml  ? ? ?Filed Weights  ? 09/16/21 0500 09/17/21 0447 09/18/21 0500  ?Weight: 56 kg 60 kg 46 kg  ? ? ? ?Physical Exam: ?General:  No acute distress  ?Head:  Normocephalic, atraumatic. Moist oral mucosal membranes, NGT  ?Eyes:  Anicteric  ?Neck:  Supple  ?Lungs:   Clear to auscultation, normal effort  ?Heart:  S1S2 no rubs  ?Abdomen:  S/p surgery  ?Extremities:  peripheral edema.  ?Neurologic:  Awake, alert, following commands  ?Skin:  No lesions  ?GU Foley catheter  ? ? ?Basic Metabolic Panel: ?Recent Labs  ?Lab 09/14/21 ?0051 09/15/21 ?0343 09/16/21 ?7846 09/17/21 ?0507 09/18/21 ?9629  ?NA 130* 136 138 141 143  ?K 3.3* 3.9 4.0 3.8 4.5  ?CL 97* 104 108 114* 115*  ?CO2 23 18* 19* 22 21*  ?GLUCOSE 92 103* 92 128* 207*  ?BUN 77* 84* 82* 71* 51*  ?CREATININE 2.50* 2.69* 2.63* 2.20* 1.49*  ?CALCIUM 7.8* 7.9* 8.5* 8.5* 7.8*  ?MG  --  2.3 2.3 2.1 1.6*  ?PHOS  --  9.8* 7.9* 4.4 4.8*  ? ? ? ?CBC: ?Recent Labs  ?Lab 09/13/21 ?1822 09/14/21 ?0051 09/15/21 ?0343 09/15/21 ?1026 09/16/21 ?5284 09/17/21 ?0507 09/18/21 ?1324  ?WBC 20.2* 32.2* 15.4*  --  10.5 7.6 14.1*  ?NEUTROABS 17.4* 29.2*  --   --   --   --   --   ?HGB 11.4* 10.8* 8.3* 8.7* 8.2* 8.4* 6.4*  ?HCT 32.1* 31.2* 24.0* 25.7* 24.2* 26.1* 20.0*  ?MCV 100.0 102.6* 103.0*  --  104.8* 108.3* 112.4*  ?PLT 147* 196 125*  --  124* 130* 124*  ? ? ? ? ?Urinalysis: ?No results for input(s):  COLORURINE, LABSPEC, [place], GLUCOSEU, HGBUR, BILIRUBINUR, KETONESUR, PROTEINUR, UROBILINOGEN, NITRITE, LEUKOCYTESUR in the last 72 hours. ? ?Invalid input(s): APPERANCEUR  ? ? ?Imaging: ?CT ABDOMEN WO CONTRAST ? ?Result Date: 09/17/2021 ?CLINICAL DATA:  Peritonitis or perforation suspected prior gastric perforation, s/p repair, to clarify possible leak noted on UGI study. S/p Phillip Heal patch repair of perforated gastric ulcer EXAM: CT ABDOMEN WITHOUT CONTRAST TECHNIQUE: Multidetector CT imaging of the abdomen was performed following the standard protocol without IV contrast. RADIATION DOSE REDUCTION: This exam was performed according to the departmental dose-optimization program which includes automated exposure control, adjustment of the mA and/or kV according to patient size and/or use of iterative reconstruction technique. COMPARISON:  Upper GI 09/17/2021, CT abdomen pelvis 09/14/2021 FINDINGS: Lower chest: Trace bilateral pleural effusions. Bilateral lower lobe passive atelectasis. Hepatobiliary: No focal liver abnormality. Layering hyperdensity within the gallbladder lumen likely representing vicarious excretion of previously administered intravenous contrast. No gallstones, gallbladder wall thickening, or pericholecystic fluid. No biliary dilatation. Pancreas: Calcifications throughout the pancreatic parenchyma likely chronic pancreatitis. Normal pancreatic contour. No surrounding inflammatory changes. No main pancreatic ductal dilatation. Spleen: Normal in size without focal abnormality. Adrenals/Urinary Tract: No adrenal nodule bilaterally. No nephrolithiasis and no hydronephrosis. No definite  contour-deforming renal mass. Stomach/Bowel: Status post gastric surgical changes with demonstration of an anterior antral gastric perforation leading to intraperitoneal previously and administered PO contrast and free air (3:39). No evidence of bowel wall thickening or dilatation. Colonic diverticulosis.  Vascular/Lymphatic: No abdominal aorta aneurysm. At least moderate atherosclerotic plaque of the aorta and its branches. No abdominal lymphadenopathy. Other: Moderate volume free gas. At least small volume free fluid. Extravasated intraperitoneal PO contrast. No organized fluid collection. Musculoskeletal: Anterior abdominal incision with skin staples. No suspicious lytic or blastic osseous lesions. No acute displaced fracture. IMPRESSION: 1. Anterior antral gastric wall perforation with associated moderate volume pneumoperitoneum, small volume ascites, extravasated PO contrast. 2. Bilateral trace pleural effusions. 3. Colonic diverticulosis. 4. Chronic pancreatitis. 5.  Aortic Atherosclerosis (ICD10-I70.0). These results will be called to the ordering clinician or representative by the Radiologist Assistant, and communication documented in the PACS or [employer]. Electronically Signed   By: Iven Finn M.D.   On: 09/17/2021 17:53  ? ?DG Abd 1 View ? ?Result Date: 09/17/2021 ?CLINICAL DATA:  Nasogastric tube placement EXAM: ABDOMEN - 1 VIEW COMPARISON:  None. FINDINGS: Nasogastric tube extends into the mid to distal body of the stomach. Percutaneous gastrostomy catheter noted within the left mid abdomen. Surgical drainage catheter noted within the mid abdomen. Laparotomy skin staples overlie the mid abdomen. Pelvis excluded from view. IMPRESSION: Nasogastric tube tip within the mid to distal body of the stomach. Electronically Signed   By: Fidela Salisbury M.D.   On: 09/17/2021 23:01  ? ?DG UGI W SINGLE CM (SOL OR THIN BA) ? ?Addendum Date: 09/17/2021   ?ADDENDUM REPORT: 09/17/2021 15:56 ADDENDUM: I discussed these findings by telephone with Dr. Hampton Abbot at approximately 1555 hours on 09/17/2021. Electronically Signed   By: Misty Stanley M.D.   On: 09/17/2021 15:56  ? ?Result Date: 09/17/2021 ?CLINICAL DATA:  Status post Phillip Heal patch repair for gastric perforation in the anterior wall of the distal stomach.  EXAM: WATER SOLUBLE UPPER GI SERIES TECHNIQUE: Single-column upper GI series was performed using water soluble contrast. CONTRAST:  50 cc Omnipaque 300 COMPARISON:  Abdomen/pelvis CT 09/14/2021. FLUOROSCOPY: Fluoroscopy Time:  4 minutes and 12 seconds. Radiation Exposure Index (if provided by the fluoroscopic device): 232.2 mGy FINDINGS: Patient was examined in AP and bilateral oblique positions. Pre-procedure scout film shows a normal bowel gas pattern with no substantial gaseous distention of the stomach. Surgical drain overlies the expected location of the distal stomach and duodenum. Patient was given sips of water soluble contrast material by mouth with the fluoroscopy table at 45-60 degree head up position. Early in the study, there is a finger-like projection of contrast that appears to be arising from the distal stomach near the pylorus and projects outside of the nondistended lumen of the gastric antrum. This is visible on images 3 through 6 of series RF #5. This same finger-like projection is seen on images 1 through 14 of series RF #8. There may be some residual contrast outside the gastric antrum on image 20 of series RF #13. Contrast material is seen flowing through the pylorus in the duodenal bulb and proximal jejunal loops. The stomach, duodenum, in jejunal loops are nondilated with relatively normal appearing peristalsis. No evidence for contrast material entering the surgical drain during this exam. IMPRESSION: 1. Identification of a finger-like projection of contrast material outside the lumen of the distal stomach early in the exam although this is not seen on later series. Nevertheless, imaging features are compatible with a contained  leak arising from the region of the distal stomach. No evidence for contrast flowing into the surgical drain. 2. The stomach, duodenum, and proximal jejunal loops are nondilated. Contrast material flows through the pylorus into the duodenum and proximal jejunum.  Electronically Signed: By: Misty Stanley M.D. On: 09/17/2021 15:39  ? ?ECHOCARDIOGRAM COMPLETE ? ?Result Date: 09/17/2021 ?   ECHOCARDIOGRAM REPORT   Patient Name:   Maria [name] Date of Exam: 09/16/2021 Medical Rec #:  [id number]

## 2021-09-18 NOTE — Assessment & Plan Note (Signed)
Creatinine continued to improve, at 1.49 today. ?-Monitor renal function ?-Avoid nephrotoxins ?

## 2021-09-18 NOTE — Assessment & Plan Note (Signed)
No withdrawal symptoms. ?-Continue with IV folic acid and thiamine.  We will switch to p.o. once able to take  Oral. ?-Continue with CIWA protocol ?

## 2021-09-18 NOTE — Assessment & Plan Note (Signed)
Blood pressure within goal.  Echocardiogram normal. ?-Continue with as needed hydralazine ?

## 2021-09-18 NOTE — Progress Notes (Signed)
Peripherally Inserted Central Catheter Placement ? ?The IV Nurse has discussed with the patient and/or persons authorized to consent for the patient, the purpose of this procedure and the potential benefits and risks involved with this procedure.  The benefits include less needle sticks, lab draws from the catheter, and the patient may be discharged home with the catheter. Risks include, but not limited to, infection, bleeding, blood clot (thrombus formation), and puncture of an artery; nerve damage and irregular heartbeat and possibility to perform a PICC exchange if needed/ordered by physician.  Alternatives to this procedure were also discussed.  Bard Power PICC patient education guide, fact sheet on infection prevention and patient information card has been provided to patient /or left at bedside.   ? ?PICC Placement Documentation  ?PICC Double Lumen 47/09/29 Right Basilic 37 cm 0 cm (Active)  ?Indication for Insertion or Continuance of Line Administration of hyperosmolar/irritating solutions (i.e. TPN, Vancomycin, etc.) 09/18/21 1654  ?Exposed Catheter (cm) 0 cm 09/18/21 1654  ?Site Assessment Clean, Dry, Intact 09/18/21 1654  ?Lumen #1 Status Flushed;Saline locked;Blood return noted 09/18/21 1654  ?Lumen #2 Status Flushed;Saline locked;Blood return noted 09/18/21 1654  ?Dressing Type Transparent;Securing device 09/18/21 1654  ?Dressing Status Antimicrobial disc in place 09/18/21 1654  ?Dressing Intervention New dressing;Other (Comment) 09/18/21 1654  ?Dressing Change Due 09/25/21 09/18/21 1654  ? ? ? ? ? ?Maria [name] ?09/18/2021, 4:55 PM ? ?

## 2021-09-18 NOTE — Progress Notes (Signed)
Nutrition Follow Up Note  ? ?DOCUMENTATION CODES:  ? ?Non-severe (moderate) malnutrition in context of social or environmental circumstances ? ?INTERVENTION:  ? ?TPN per pharmacy ? ?Recommend thiamine 130m daily added to TPN x 3 days  ? ?Pt at refeed risk; recommend monitor potassium, magnesium and phosphorus labs daily until stable ? ?Daily weights  ? ?Once appropriate for tube feeds, recommend: ? ?Osmolite 1.5_0 /hr- Initiate at 258mhr and increase by 1057mr q 8 hours until goal rate is reached.  ? ?Pro-Source 61m52mily via tube, provides 40kcal and 11g of protein per serving  ? ?Free water flushes 30ml26mhours to maintain tube patency  ? ?Regimen provides 1840kcal/day, 86g/day protein and 1094ml/15mof free water  ? ?NUTRITION DIAGNOSIS:  ? 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 ? ?GOAL:  ? ?Patient will meet greater than or equal to 90% of their needs ?-not met  ? ?MONITOR:  ? ?Diet advancement, Labs, Weight trends, Skin, I & O's, TPN ? ?ASSESSMENT:  ? ?51 y/o11emale with h/o GERD, anxiety, depression, etoh abuse and HTN who is admitted with perforated duodenal ulcer s/p Graham patch repair 3/15. ? ?Pt s/p exploratory laparotomy, primary closure of perforated peptic ulcer Graham patch repair, intraabdominal abscess drainage, 62F gastrostomy tube insertion, Jejunostomy tube insertion (18 French Foley catheter) 3/19 ? ?Pt returned to the OR on 3/19 secondary to perforation. Pt remains NPO. Plan today is for PICC line and TPN. Pt is at high refeed risk. Per chart, pt down ~15lbs since admit. Drains with 60ml o86mt.  ? ?Medications reviewed and include: folic acid, insulin, protonix, NaCl w/ 5% dextrose _1 /hr, diflucan, Mg sulfate, zosyn ? ?Labs reviewed: K 4.5 wnl, BUN 51(H), creat 1.49(H), P 4.8(H), Mg 1.6(L) ?Triglycerides- 97.0- 3/15 ?Wbc-  14.1(H), Hgb 6.4(L), Hct 20.0(L), MCV 112.4(H), MCH 36.0(H) ?Cbgs- 191, 225, 157 x 24 hrs ? ?Diet Order:   ?Diet Order   ? ?       ?  Diet NPO time specified  Diet effective now       ?  ? ?  ?  ? ?  ? ?EDUCATION NEEDS:  ? ?No education needs have been identified at this time ? ?Skin:  Skin Assessment: Reviewed RN Assessment (incision abdomen) ? ?Last BM:  3/19- type 6 ? ?Height:  ? ?Ht Readings from Last 1 Encounters:  ?09/14/21 _2  (1.575 m)  ? ? ?Weight:  ? ?Wt Readings from Last 1 Encounters:  ?09/18/21 46 kg  ? ? ?Ideal Body Weight:  50 kg ? ?BMI:  Body mass index is 18.55 kg/m?. ? ?Estimated Nutritional Needs:  ? ?Kcal:  1600-1800kcal/day ? ?Protein:  80-90g/day ? ?Fluid:  1.6-1.8L/day ? ?Ariez Neilan CKoleen Distance, LDN ?Please refer to AMION for RD and/or RD on-call/weekend/after hours pager ? ?

## 2021-09-19 ENCOUNTER — Inpatient Hospital Stay: Payer: BC Managed Care – PPO

## 2021-09-19 ENCOUNTER — Encounter: Payer: Self-pay | Admitting: Surgery

## 2021-09-19 DIAGNOSIS — E870 Hyperosmolality and hypernatremia: Secondary | ICD-10-CM | POA: Diagnosis not present

## 2021-09-19 DIAGNOSIS — K275 Chronic or unspecified peptic ulcer, site unspecified, with perforation: Secondary | ICD-10-CM | POA: Diagnosis not present

## 2021-09-19 DIAGNOSIS — D62 Acute posthemorrhagic anemia: Secondary | ICD-10-CM | POA: Diagnosis not present

## 2021-09-19 HISTORY — DX: Acute posthemorrhagic anemia: D62

## 2021-09-19 LAB — CBC
HCT: 24.5 % — ABNORMAL LOW (ref 36.0–46.0)
Hemoglobin: 8 g/dL — ABNORMAL LOW (ref 12.0–15.0)
MCH: 33.2 pg (ref 26.0–34.0)
MCHC: 32.7 g/dL (ref 30.0–36.0)
MCV: 101.7 fL — ABNORMAL HIGH (ref 80.0–100.0)
Platelets: 138 10*3/uL — ABNORMAL LOW (ref 150–400)
RBC: 2.41 MIL/uL — ABNORMAL LOW (ref 3.87–5.11)
RDW: 18.2 % — ABNORMAL HIGH (ref 11.5–15.5)
WBC: 14.5 10*3/uL — ABNORMAL HIGH (ref 4.0–10.5)
nRBC: 0 % (ref 0.0–0.2)

## 2021-09-19 LAB — COMPREHENSIVE METABOLIC PANEL
ALT: 15 U/L (ref 0–44)
AST: 22 U/L (ref 15–41)
Albumin: 2 g/dL — ABNORMAL LOW (ref 3.5–5.0)
Alkaline Phosphatase: 137 U/L — ABNORMAL HIGH (ref 38–126)
Anion gap: 5 (ref 5–15)
BUN: 41 mg/dL — ABNORMAL HIGH (ref 6–20)
CO2: 23 mmol/L (ref 22–32)
Calcium: 8.4 mg/dL — ABNORMAL LOW (ref 8.9–10.3)
Chloride: 122 mmol/L — ABNORMAL HIGH (ref 98–111)
Creatinine, Ser: 1.22 mg/dL — ABNORMAL HIGH (ref 0.44–1.00)
GFR, Estimated: 54 mL/min — ABNORMAL LOW (ref >60)
Glucose, Bld: 168 mg/dL — ABNORMAL HIGH (ref 70–99)
Potassium: 4.4 mmol/L (ref 3.5–5.1)
Sodium: 150 mmol/L — ABNORMAL HIGH (ref 135–145)
Total Bilirubin: 0.8 mg/dL (ref 0.3–1.2)
Total Protein: 5.6 g/dL — ABNORMAL LOW (ref 6.5–8.1)

## 2021-09-19 LAB — PREPARE RBC (CROSSMATCH)

## 2021-09-19 LAB — PROTIME-INR
INR: 1.5 — ABNORMAL HIGH (ref 0.8–1.2)
INR: 1 (ref 0.8–1.2)
Prothrombin Time: 17.7 seconds — ABNORMAL HIGH (ref 11.4–15.2)
Prothrombin Time: 13.4 seconds (ref 11.4–15.2)

## 2021-09-19 LAB — BPAM FFP
Blood Product Expiration Date: 202303252359
ISSUE DATE / TIME: 202303201412
Unit Type and Rh: 5100

## 2021-09-19 LAB — GLUCOSE, CAPILLARY
Glucose-Capillary: 136 mg/dL — ABNORMAL HIGH (ref 70–99)
Glucose-Capillary: 142 mg/dL — ABNORMAL HIGH (ref 70–99)
Glucose-Capillary: 145 mg/dL — ABNORMAL HIGH (ref 70–99)
Glucose-Capillary: 160 mg/dL — ABNORMAL HIGH (ref 70–99)
Glucose-Capillary: 171 mg/dL — ABNORMAL HIGH (ref 70–99)

## 2021-09-19 LAB — PREPARE FRESH FROZEN PLASMA

## 2021-09-19 LAB — MAGNESIUM: Magnesium: 2.4 mg/dL (ref 1.7–2.4)

## 2021-09-19 LAB — HEMOGLOBIN AND HEMATOCRIT, BLOOD
HCT: 22.1 % — ABNORMAL LOW (ref 36.0–46.0)
Hemoglobin: 7.3 g/dL — ABNORMAL LOW (ref 12.0–15.0)

## 2021-09-19 LAB — TRIGLYCERIDES: Triglycerides: 160 mg/dL — ABNORMAL HIGH (ref <150)

## 2021-09-19 LAB — PHOSPHORUS: Phosphorus: 2.7 mg/dL (ref 2.5–4.6)

## 2021-09-19 MED ORDER — INSULIN ASPART 100 UNIT/ML IJ SOLN
0.0000 [IU] | Freq: Four times a day (QID) | INTRAMUSCULAR | Status: DC
  Administered 2021-09-19 – 2021-09-21 (×7): 1 [IU] via SUBCUTANEOUS
  Administered 2021-09-21 – 2021-09-22 (×5): 2 [IU] via SUBCUTANEOUS
  Administered 2021-09-23 (×4): 1 [IU] via SUBCUTANEOUS
  Administered 2021-09-23: 2 [IU] via SUBCUTANEOUS
  Administered 2021-09-24 – 2021-10-11 (×49): 1 [IU] via SUBCUTANEOUS
  Filled 2021-09-19 (×63): qty 1

## 2021-09-19 MED ORDER — DEXTROSE 5 % IV SOLN: INTRAVENOUS | Status: AC

## 2021-09-19 MED ORDER — TRAVASOL 10 % IV SOLN
INTRAVENOUS | Status: AC
  Filled 2021-09-19: qty 900.12

## 2021-09-19 MED ORDER — SODIUM CHLORIDE 0.9% IV SOLUTION: Freq: Once | INTRAVENOUS | Status: DC

## 2021-09-19 MED ORDER — DEXTROSE 5 % IV SOLN: INTRAVENOUS | Status: DC

## 2021-09-19 MED ORDER — IOHEXOL 350 MG/ML SOLN
80.0000 mL | Freq: Once | INTRAVENOUS | Status: AC | PRN
  Administered 2021-09-20: 80 mL via INTRAVENOUS

## 2021-09-19 NOTE — Progress Notes (Addendum)
Subjective:  ?CC: ?Maria [name] is a 51 y.o. female  Hospital stay day 5, 2 Days Post-Op exploratory laparotomy, redo peptic ulcer perforation repair, G and J tube placement. ? ?HPI: ?No acute issues overnight.   ? ?ROS:  ?General: Denies weight loss, weight gain, fatigue, fevers, chills, and night sweats. ?Heart: Denies chest pain, palpitations, racing heart, irregular heartbeat, leg pain or swelling, and decreased activity tolerance. ?Respiratory: Denies breathing difficulty, shortness of breath, wheezing, cough, and sputum. ?GI: Denies change in appetite, heartburn, nausea, vomiting, constipation, diarrhea, and blood in stool. ?GU: Denies difficulty urinating, pain with urinating, urgency, frequency, blood in urine. ? ? ?Objective:  ? ?Temp:  [97.2 ?F (36.2 ?C)-98.4 ?F (36.9 ?C)] 97.9 ?F (36.6 ?C) (03/21 1335) ?Pulse Rate:  [62-93] 62 (03/21 1335) ?Resp:  [15-18] 18 (03/21 1335) ?BP: (120-169)/(57-77) 169/77 (03/21 1335) ?SpO2:  [98 %-100 %] 100 % (03/21 1052) ?Weight:  [41.6 kg] 41.6 kg (03/21 0500)     Height: '5\' 2"'$  (157.5 cm) Weight: 41.6 kg BMI (Calculated): 16.77  ? ?Intake/Output this shift:  ? ?Intake/Output Summary (Last 24 hours) at 09/19/2021 1338 ?Last data filed at 09/19/2021 1335 ?Gross per 24 hour  ?Intake 4011.76 ml  ?Output 2060 ml  ?Net 1951.76 ml  ? ? ?Constitutional :  alert, cooperative, appears stated age, and no distress  ?Respiratory:  clear to auscultation bilaterally  ?Cardiovascular:  regular rate and rhythm  ?Gastrointestinal: Soft, some TTP around incision, no guarding JP with more sanguinous  drainage today, NG with bilious output .   ?Skin: Cool and moist. Staples c/d/i  ?Psychiatric: Normal affect, non-agitated, not confused  ?   ?  ?LABS:  ?CMP Latest Ref Rng & Units 09/19/2021 09/18/2021 09/17/2021  ?Glucose 70 - 99 mg/dL 168(H) 207(H) 128(H)  ?BUN 6 - 20 mg/dL 41(H) 51(H) 71(H)  ?Creatinine 0.44 - 1.00 mg/dL 1.22(H) 1.49(H) 2.20(H)  ?Sodium 135 - 145 mmol/L 150(H) 143 141  ?Potassium  3.5 - 5.1 mmol/L 4.4 4.5 3.8  ?Chloride 98 - 111 mmol/L 122(H) 115(H) 114(H)  ?CO2 22 - 32 mmol/L 23 21(L) 22  ?Calcium 8.9 - 10.3 mg/dL 8.4(L) 7.8(L) 8.5(L)  ?Total Protein 6.5 - 8.1 g/dL 5.6(L) 4.7(L) 5.6(L)  ?Total Bilirubin 0.3 - 1.2 mg/dL 0.8 1.4(H) 1.2  ?Alkaline Phos 38 - 126 U/L 137(H) 160(H) 236(H)  ?AST 15 - 41 U/L '22 21 23  '$ ?ALT 0 - 44 U/L '15 13 15  '$ ? ?CBC Latest Ref Rng & Units 09/19/2021 09/18/2021 09/17/2021  ?WBC 4.0 - 10.5 K/uL 14.5(H) 14.1(H) 7.6  ?Hemoglobin 12.0 - 15.0 g/dL 8.0(L) 6.4(L) 8.4(L)  ?Hematocrit 36.0 - 46.0 % 24.5(L) 20.0(L) 26.1(L)  ?Platelets 150 - 400 K/uL 138(L) 124(L) 130(L)  ? ? ?RADS: ?N/a ?Assessment:  ? ?S/p exploratory laparotomy, redo peptic ulcer perforation repair, G and J tube placement. Due to persistent leak after intial graham patch repair ? ?Hgb increase with 2u pRBC, but JP output more sanguinous.  Will have to continue to monitor closely, transfused one additional unit of FFP due to borderline INR at 1.5 ? ?Due to persistent leak, will continue with bowel rest for at least another week.  TPN, PICC placement ordered.  Continue IV ABX and antifungal for intra-abdominal infection coverage.  TPN for nutrition.  ? ?Ok to remove foley from surgery standpoint ? ?labs/images/medications/previous chart entries reviewed personally and relevant changes/updates noted above. ? ? ?

## 2021-09-19 NOTE — Assessment & Plan Note (Signed)
With pneumoperitoneum and intra-abdominal abscesses s/p exploratory laparotomy twice as first time she was continued to have leak.  First surgery was on 09/13/2021 and second was on 09/17/2021. ?-Patient will remain n.p.o. for bowel rest for another week ?-NG tube in place ?-Surgery ordered PICC line and starting TPN ?-Continue with Zosyn and Diflucan-being managed by surgery. ?-Continue with pain management and supportive care ?

## 2021-09-19 NOTE — Assessment & Plan Note (Signed)
Sodium increased to corrected 151 today, free water deficit of 1.6 L.  She is on TPN. ?-Will give her 1 L of D5 ?-Monitor sodium ?

## 2021-09-19 NOTE — Assessment & Plan Note (Signed)
Blood pressure mildly elevated today.  Echocardiogram normal. ?-Continue with as needed hydralazine ?

## 2021-09-19 NOTE — Assessment & Plan Note (Signed)
Creatinine continued to improve, at 1.22 today. ?-Monitor renal function ?-Avoid nephrotoxins ?

## 2021-09-19 NOTE — Assessment & Plan Note (Signed)
Patient received 2 unit of PRBC when her hemoglobin dropped to 6.4 after second surgery.  She also received 1 unit of FFP due to INR of 1.7. ?Hemoglobin improved to 8 today. ?-Monitor hemoglobin ?-Transfuse if below 7 ?-Check anemia panel ?

## 2021-09-19 NOTE — Progress Notes (Signed)
?[facility]  ?PROGRESS NOTE  ? ?Subjective:  ? ?Patient resting quietly ?Alert and oriented ?States she feels better today. ? ?Remains NPO with NGT placed ?TPN ?Creatinine 1.22 ?UOP 1.5L ?Sodium 150 ? ?Objective:  ?Vital signs: ?Blood pressure (!) 162/71, pulse 62, temperature 98 ?F (36.7 ?C), temperature source Oral, resp. rate 18, height '5\' 2"'$  (1.575 m), weight 41.6 kg, last menstrual period 02/24/2018, SpO2 100 %. ? ?Intake/Output Summary (Last 24 hours) at 09/19/2021 1053 ?Last data filed at 09/19/2021 0808 ?Gross per 24 hour  ?Intake 3655.76 ml  ?Output 1460 ml  ?Net 2195.76 ml  ? ? ?Filed Weights  ? 09/17/21 0447 09/18/21 0500 09/19/21 0500  ?Weight: 60 kg 46 kg 41.6 kg  ? ? ? ?Physical Exam: ?General:  No acute distress  ?Head:  Normocephalic, atraumatic. Moist oral mucosal membranes, NGT  ?Eyes:  Anicteric  ?Lungs:   Clear to auscultation, normal effort  ?Heart:  S1S2 no rubs  ?Abdomen:  S/p surgery, JP drain  ?Extremities:  peripheral edema.  ?Neurologic:  Awake, alert, following commands  ?Skin:  No lesions  ?GU Foley catheter  ? ? ?Basic Metabolic Panel: ?Recent Labs  ?Lab 09/15/21 ?0343 09/16/21 ?1610 09/17/21 ?0507 09/18/21 ?9604 09/19/21 ?0439  ?NA 136 138 141 143 150*  ?K 3.9 4.0 3.8 4.5 4.4  ?CL 104 108 114* 115* 122*  ?CO2 18* 19* 22 21* 23  ?GLUCOSE 103* 92 128* 207* 168*  ?BUN 84* 82* 71* 51* 41*  ?CREATININE 2.69* 2.63* 2.20* 1.49* 1.22*  ?CALCIUM 7.9* 8.5* 8.5* 7.8* 8.4*  ?MG 2.3 2.3 2.1 1.6* 2.4  ?PHOS 9.8* 7.9* 4.4 4.8* 2.7  ? ? ? ?CBC: ?Recent Labs  ?Lab 09/13/21 ?1822 09/14/21 ?0051 09/15/21 ?0343 09/15/21 ?1026 09/16/21 ?5409 09/17/21 ?0507 09/18/21 ?8119 09/19/21 ?0439  ?WBC 20.2* 32.2* 15.4*  --  10.5 7.6 14.1* 14.5*  ?NEUTROABS 17.4* 29.2*  --   --   --   --   --   --   ?HGB 11.4* 10.8* 8.3* 8.7* 8.2* 8.4* 6.4* 8.0*  ?HCT 32.1* 31.2* 24.0* 25.7* 24.2* 26.1* 20.0* 24.5*  ?MCV 100.0 102.6* 103.0*  --  104.8* 108.3* 112.4* 101.7*  ?PLT 147* 196 125*  --  124* 130* 124* 138*   ? ? ? ? ?Urinalysis: ?No results for input(s): COLORURINE, LABSPEC, [place], GLUCOSEU, HGBUR, BILIRUBINUR, KETONESUR, PROTEINUR, UROBILINOGEN, NITRITE, LEUKOCYTESUR in the last 72 hours. ? ?Invalid input(s): APPERANCEUR  ? ? ?Imaging: ?CT ABDOMEN WO CONTRAST ? ?Result Date: 09/17/2021 ?CLINICAL DATA:  Peritonitis or perforation suspected prior gastric perforation, s/p repair, to clarify possible leak noted on UGI study. S/p Phillip Heal patch repair of perforated gastric ulcer EXAM: CT ABDOMEN WITHOUT CONTRAST TECHNIQUE: Multidetector CT imaging of the abdomen was performed following the standard protocol without IV contrast. RADIATION DOSE REDUCTION: This exam was performed according to the departmental dose-optimization program which includes automated exposure control, adjustment of the mA and/or kV according to patient size and/or use of iterative reconstruction technique. COMPARISON:  Upper GI 09/17/2021, CT abdomen pelvis 09/14/2021 FINDINGS: Lower chest: Trace bilateral pleural effusions. Bilateral lower lobe passive atelectasis. Hepatobiliary: No focal liver abnormality. Layering hyperdensity within the gallbladder lumen likely representing vicarious excretion of previously administered intravenous contrast. No gallstones, gallbladder wall thickening, or pericholecystic fluid. No biliary dilatation. Pancreas: Calcifications throughout the pancreatic parenchyma likely chronic pancreatitis. Normal pancreatic contour. No surrounding inflammatory changes. No main pancreatic ductal dilatation. Spleen: Normal in size without focal abnormality. Adrenals/Urinary Tract: No adrenal nodule bilaterally. No nephrolithiasis and  no hydronephrosis. No definite contour-deforming renal mass. Stomach/Bowel: Status post gastric surgical changes with demonstration of an anterior antral gastric perforation leading to intraperitoneal previously and administered PO contrast and free air (3:39). No evidence of bowel wall thickening or  dilatation. Colonic diverticulosis. Vascular/Lymphatic: No abdominal aorta aneurysm. At least moderate atherosclerotic plaque of the aorta and its branches. No abdominal lymphadenopathy. Other: Moderate volume free gas. At least small volume free fluid. Extravasated intraperitoneal PO contrast. No organized fluid collection. Musculoskeletal: Anterior abdominal incision with skin staples. No suspicious lytic or blastic osseous lesions. No acute displaced fracture. IMPRESSION: 1. Anterior antral gastric wall perforation with associated moderate volume pneumoperitoneum, small volume ascites, extravasated PO contrast. 2. Bilateral trace pleural effusions. 3. Colonic diverticulosis. 4. Chronic pancreatitis. 5.  Aortic Atherosclerosis (ICD10-I70.0). These results will be called to the ordering clinician or representative by the Radiologist Assistant, and communication documented in the PACS or [employer]. Electronically Signed   By: Iven Finn M.D.   On: 09/17/2021 17:53  ? ?DG Abd 1 View ? ?Result Date: 09/17/2021 ?CLINICAL DATA:  Nasogastric tube placement EXAM: ABDOMEN - 1 VIEW COMPARISON:  None. FINDINGS: Nasogastric tube extends into the mid to distal body of the stomach. Percutaneous gastrostomy catheter noted within the left mid abdomen. Surgical drainage catheter noted within the mid abdomen. Laparotomy skin staples overlie the mid abdomen. Pelvis excluded from view. IMPRESSION: Nasogastric tube tip within the mid to distal body of the stomach. Electronically Signed   By: Fidela Salisbury M.D.   On: 09/17/2021 23:01  ? ?DG UGI W SINGLE CM (SOL OR THIN BA) ? ?Addendum Date: 09/17/2021   ?ADDENDUM REPORT: 09/17/2021 15:56 ADDENDUM: I discussed these findings by telephone with Dr. Hampton Abbot at approximately 1555 hours on 09/17/2021. Electronically Signed   By: Misty Stanley M.D.   On: 09/17/2021 15:56  ? ?Result Date: 09/17/2021 ?CLINICAL DATA:  Status post Phillip Heal patch repair for gastric perforation in the  anterior wall of the distal stomach. EXAM: WATER SOLUBLE UPPER GI SERIES TECHNIQUE: Single-column upper GI series was performed using water soluble contrast. CONTRAST:  50 cc Omnipaque 300 COMPARISON:  Abdomen/pelvis CT 09/14/2021. FLUOROSCOPY: Fluoroscopy Time:  4 minutes and 12 seconds. Radiation Exposure Index (if provided by the fluoroscopic device): 232.2 mGy FINDINGS: Patient was examined in AP and bilateral oblique positions. Pre-procedure scout film shows a normal bowel gas pattern with no substantial gaseous distention of the stomach. Surgical drain overlies the expected location of the distal stomach and duodenum. Patient was given sips of water soluble contrast material by mouth with the fluoroscopy table at 45-60 degree head up position. Early in the study, there is a finger-like projection of contrast that appears to be arising from the distal stomach near the pylorus and projects outside of the nondistended lumen of the gastric antrum. This is visible on images 3 through 6 of series RF #5. This same finger-like projection is seen on images 1 through 14 of series RF #8. There may be some residual contrast outside the gastric antrum on image 20 of series RF #13. Contrast material is seen flowing through the pylorus in the duodenal bulb and proximal jejunal loops. The stomach, duodenum, in jejunal loops are nondilated with relatively normal appearing peristalsis. No evidence for contrast material entering the surgical drain during this exam. IMPRESSION: 1. Identification of a finger-like projection of contrast material outside the lumen of the distal stomach early in the exam although this is not seen on later series. Nevertheless, imaging features are  compatible with a contained leak arising from the region of the distal stomach. No evidence for contrast flowing into the surgical drain. 2. The stomach, duodenum, and proximal jejunal loops are nondilated. Contrast material flows through the pylorus into  the duodenum and proximal jejunum. Electronically Signed: By: Misty Stanley M.D. On: 09/17/2021 15:39  ? ?[place] EKG SITE RITE ? ?Result Date: 09/18/2021 ?If [employer] not attached, placement could not be confirm

## 2021-09-19 NOTE — Progress Notes (Signed)
?Progress Note ? ? ?Patient: Maria [name] WUJ:[id number] DOB: 1971/06/24 DOA: 09/13/2021     5 ?DOS: the patient was seen and examined on 09/19/2021 ?  ?Brief hospital course: ?Taken from prior notes. ? ?Maria [name] is a 51 y.o. female with PMH of HTN, pancreatitis, GERD/gastritis, anxiety, depression, alcohol abuse presented at [facility] ED with abdominal pain.  Patient was admitted on 09/13/2021 under general surgery due to pneumoperitoneum secondary to perforated peptic ulcer, patient was taken to the OR s/p ex lap with primary closure of perforated peptic ulcer with Phillip Heal patch repair with intra-abdominal abscess drainage.  Postop patient became unresponsive so code stroke was called but work-up was negative.  Patient was in the ICU, downgraded on the medical floor on 09/16/2021. ? ?Patient underwent another exploratory laparotomy yesterday on 09/17/2021 when peptic ulcer perforation repair was redone along with G and J-tube placement, intra-abdominal abscess was also drained. ? ?NG tube in place. ?Patient was started on TPN today, 09/18/2021 as surgery would like to give her a bowel rest for at least for 1 week. ? ?Foley catheter in place-surgery wants to keep for another 1 to 2 days. ?PICC line was placed on 09/18/2021 to start TPN ? ?3/21: Patient overall stable, labs with hypernatremia with sodium of 150.  She was not on any IV fluid except TPN, giving some D5. ?Surgery wants to give bowel rest for 1 week. ?Foley can be removed today. ? ? ?Assessment and Plan: ?* Perforated peptic ulcer ([place]) ?With pneumoperitoneum and intra-abdominal abscesses s/p exploratory laparotomy twice as first time she was continued to have leak.  First surgery was on 09/13/2021 and second was on 09/17/2021. ?-Patient will remain n.p.o. for bowel rest for another week ?-NG tube in place ?-Surgery ordered PICC line and starting TPN ?-Continue with Zosyn and Diflucan-being managed by surgery. ?-Continue with pain management and supportive  care ? ?Malnutrition of moderate degree ?Estimated body mass index is 18.55 kg/m? as calculated from the following: ?  Height as of this encounter: '5\' 2"'$  (1.575 m). ?  Weight as of this encounter: 46 kg.  ? ?-Patient is being started on TPN ? ?Essential hypertension ?Blood pressure mildly elevated today.  Echocardiogram normal. ?-Continue with as needed hydralazine ? ?AKI (acute kidney injury) ([place]) ?Creatinine continued to improve, at 1.22 today. ?-Monitor renal function ?-Avoid nephrotoxins ? ?Vitamin D deficiency ?Vitamin D level at 11.5. ?-We will start vitamin D 50,000 units p.o. weekly once able to take p.o. ? ?Alcohol abuse ?No withdrawal symptoms. ?-Continue with IV folic acid and thiamine.  We will switch to p.o. once able to take  Oral. ?-Continue with CIWA protocol ? ?Acute postoperative anemia due to expected blood loss ?Patient received 2 unit of PRBC when her hemoglobin dropped to 6.4 after second surgery.  She also received 1 unit of FFP due to INR of 1.7. ?Hemoglobin improved to 8 today. ?-Monitor hemoglobin ?-Transfuse if below 7 ?-Check anemia panel ? ?Hypernatremia ?Sodium increased to corrected 151 today, free water deficit of 1.6 L.  She is on TPN. ?-Will give her 1 L of D5 ?-Monitor sodium ?  ? ?Subjective: Patient was seen and examined today.  She was feeling that she is improving.  Continues to have moderate amount of pain.  Drain with bloody discharge. ? ?Physical Exam: ?Vitals:  ? 09/19/21 0730 09/19/21 1037 09/19/21 1052 09/19/21 1335  ?BP: (!) 150/76 (!) 145/73 (!) 162/71 (!) 169/77  ?Pulse: 62 68 62 62  ?Resp: '18 18 18 18  '$ ?Temp:  97.7 ?F (36.5 ?C) 98 ?F (36.7 ?C) 98 ?F (36.7 ?C) 97.9 ?F (36.6 ?C)  ?TempSrc:   Oral Oral  ?SpO2: 99% 100% 100%   ?Weight:      ?Height:      ? ?General.  Well-developed lady in no acute distress.  NG tube in place ?Pulmonary.  Lungs clear bilaterally, normal respiratory effort. ?CV.  Regular rate and rhythm, no JVD, rub or murmur. ?Abdomen.  Soft, nontender,  nondistended, BS positive.  Drains with bloody discharge. ?CNS.  Alert and oriented .  No focal neurologic deficit. ?Extremities.  No edema, no cyanosis, pulses intact and symmetrical. ?Psychiatry.  Judgment and insight appears normal. ? ?Data Reviewed: ?I reviewed other notes and labs. ? ?Family Communication: Discussed with patient ? ?Disposition: ?Status is: Inpatient ?Remains inpatient appropriate because: Severity of illness ? ? Planned Discharge Destination: To be determined ? ?Time spent: 50 minutes ? ?This record has been created using Systems analyst. Errors have been sought and corrected,but may not always be located. Such creation errors do not reflect on the standard of care. ? ?Author: ?Lorella Nimrod, MD ?09/19/2021 3:26 PM ? ?For on call review [email or web address].  ?

## 2021-09-19 NOTE — Progress Notes (Signed)
? ?      CROSS COVER NOTE ? ?NAME: Maria [name] ?MRN: [id number] ?DOB : 1970/09/25 ? ?Received a call from Dr Vanita Panda with radiology with abnormal findings on KUB. "There are new air pockets underneath the left hemidiaphragm which could be redistributed residual free air from the laparotomy or new free air from intestinal perforation or postsurgical leak of the stomach" ? ?KUB was ordered by nursing at change of shift after patient was found with NGT no longer in place. Nursing replaced the NGT and ordered a KUB. Above discussed with Dr Lysle Pearl of General surgery who states findings are as expected and requested that nursing not replace NGT in the future if it comes out and to discuss any tube related issues with General Surgery. ? ?Neomia Glass MHA, MSN, FNP-BC ?Nurse Practitioner ?[facility] ?[place] ?Pager [phone] ? ? ?

## 2021-09-19 NOTE — Consult Note (Signed)
PHARMACY - TOTAL PARENTERAL NUTRITION CONSULT NOTE  ? ?Indication: Prolonged ileus ? ?Patient Measurements: ?Height: '5\' 2"'$  (157.5 cm) ?Weight: 41.6 kg (91 lb 11.4 oz) ?IBW/kg (Calculated) : 50.1 ?TPN AdjBW (KG): 53.5 ?Body mass index is 16.77 kg/m?. ?Usual Weight: 55kg ? ?Assessment: [name] POD4 from [facility] repair of non-healing perforated peptic ulcer c/b prolonged ileus. GJ tube placed 3/19 and Pharmacy consulted for mgmt of TPN and electrolytes. ? ?Glucose / Insulin: 146 - 225 (no SSI currently) ?Electrolytes: hypernatremia ?Renal: Scr 2.63>2.2>1.22 ?Hepatic: LFTs wnl ?Intake / Output; MIVF: no MIVF ?GI Imaging: ?3/19 CT abd -- Anterior antral gastric wall perforation with associated moderate volume pneumoperitoneum, small volume ascites, extravasated PO contrast. Colonic diverticulosis & Chronic pancreatitis. ? ?3/16 CT abd/pelvis: POD1 Decreased pneumoperitoneum, Graham patch repair of the perforated ?distal stomach, postoperative drain at the greater omentum. Small volume mostly simple density fluid in the abdomen is primarily around the liver. No bowel obstruction. Chronic calcific pancreatitis ? ?GI Surgeries / Procedures: 03/19 exploratory laparotomy ? ?Central access: 09/18/21 ?TPN start date: 09/18/21 ? ?Nutritional Goals: ?Goal TPN rate is 65 mL/hr (provides 90.1 g of protein and 1676.7 kcals per day) ? ?RD Assessment: ?Estimated Needs ?Total Energy Estimated Needs: 1600-1800kcal/day ?Total Protein Estimated Needs: 80-90g/day ?Total Fluid Estimated Needs: 1.6-1.8L/day ? ?Current Nutrition:  ?NPO ? ?Plan:  ?advance TPN to 65 mL/hr at 1800 (total volume including overfill 1660 mL) ?Nutritional Components ?Amino acids (using 10% Travasol): 90.1 grams ?Dextrose: 249.6 grams ?Lipids (using 20% SMOFlipids): 46.8 grams ?Electrolytes in TPN: Na 44mq/L, K 53m/L, Ca 4m34mL, Mg 4mE76m, and Phos 14mm14m. Cl:Ac 1:1 ?Add standard MVI and trace elements to TPN ?Add thiamine '100mg'$  daily x3d (d2/3d) ?adjust  Sensitive SSI to q6h and adjust as needed  ?Monitor TPN labs on Mon/Thurs when stable, daily until then ? ?RodneDallie Piles1/2023,7:17 AM ? ?

## 2021-09-20 ENCOUNTER — Telehealth: Payer: Self-pay

## 2021-09-20 DIAGNOSIS — K275 Chronic or unspecified peptic ulcer, site unspecified, with perforation: Secondary | ICD-10-CM | POA: Diagnosis not present

## 2021-09-20 LAB — CBC
HCT: 25.3 % — ABNORMAL LOW (ref 36.0–46.0)
HCT: 27 % — ABNORMAL LOW (ref 36.0–46.0)
Hemoglobin: 8.3 g/dL — ABNORMAL LOW (ref 12.0–15.0)
Hemoglobin: 8.6 g/dL — ABNORMAL LOW (ref 12.0–15.0)
MCH: 34.6 pg — ABNORMAL HIGH (ref 26.0–34.0)
MCH: 32.1 pg (ref 26.0–34.0)
MCHC: 32.8 g/dL (ref 30.0–36.0)
MCHC: 31.9 g/dL (ref 30.0–36.0)
MCV: 105.4 fL — ABNORMAL HIGH (ref 80.0–100.0)
MCV: 100.7 fL — ABNORMAL HIGH (ref 80.0–100.0)
Platelets: 125 10*3/uL — ABNORMAL LOW (ref 150–400)
Platelets: 130 10*3/uL — ABNORMAL LOW (ref 150–400)
RBC: 2.4 MIL/uL — ABNORMAL LOW (ref 3.87–5.11)
RBC: 2.68 MIL/uL — ABNORMAL LOW (ref 3.87–5.11)
RDW: 21.2 % — ABNORMAL HIGH (ref 11.5–15.5)
RDW: 19.9 % — ABNORMAL HIGH (ref 11.5–15.5)
WBC: 11.2 10*3/uL — ABNORMAL HIGH (ref 4.0–10.5)
WBC: 12.1 10*3/uL — ABNORMAL HIGH (ref 4.0–10.5)
nRBC: 0 % (ref 0.0–0.2)
nRBC: 0 % (ref 0.0–0.2)

## 2021-09-20 LAB — COMPREHENSIVE METABOLIC PANEL
ALT: 15 U/L (ref 0–44)
AST: 27 U/L (ref 15–41)
Albumin: 2.2 g/dL — ABNORMAL LOW (ref 3.5–5.0)
Alkaline Phosphatase: 139 U/L — ABNORMAL HIGH (ref 38–126)
Anion gap: 6 (ref 5–15)
BUN: 30 mg/dL — ABNORMAL HIGH (ref 6–20)
CO2: 22 mmol/L (ref 22–32)
Calcium: 8.6 mg/dL — ABNORMAL LOW (ref 8.9–10.3)
Chloride: 119 mmol/L — ABNORMAL HIGH (ref 98–111)
Creatinine, Ser: 0.85 mg/dL (ref 0.44–1.00)
GFR, Estimated: >60 mL/min (ref >60)
Glucose, Bld: 154 mg/dL — ABNORMAL HIGH (ref 70–99)
Potassium: 4.3 mmol/L (ref 3.5–5.1)
Sodium: 147 mmol/L — ABNORMAL HIGH (ref 135–145)
Total Bilirubin: 1 mg/dL (ref 0.3–1.2)
Total Protein: 5.6 g/dL — ABNORMAL LOW (ref 6.5–8.1)

## 2021-09-20 LAB — RETICULOCYTES
Immature Retic Fract: 22.4 % — ABNORMAL HIGH (ref 2.3–15.9)
RBC.: 2.4 MIL/uL — ABNORMAL LOW (ref 3.87–5.11)
Retic Count, Absolute: 71.8 10*3/uL (ref 19.0–186.0)
Retic Ct Pct: 3 % (ref 0.4–3.1)

## 2021-09-20 LAB — PROTIME-INR
INR: 1.2 (ref 0.8–1.2)
INR: 1.2 (ref 0.8–1.2)
Prothrombin Time: 14.8 seconds (ref 11.4–15.2)
Prothrombin Time: 14.8 seconds (ref 11.4–15.2)

## 2021-09-20 LAB — BPAM FFP
Blood Product Expiration Date: 202303262359
ISSUE DATE / TIME: 202303211030
Unit Type and Rh: 5100

## 2021-09-20 LAB — GLUCOSE, CAPILLARY
Glucose-Capillary: 88 mg/dL (ref 70–99)
Glucose-Capillary: 119 mg/dL — ABNORMAL HIGH (ref 70–99)
Glucose-Capillary: 141 mg/dL — ABNORMAL HIGH (ref 70–99)
Glucose-Capillary: 134 mg/dL — ABNORMAL HIGH (ref 70–99)

## 2021-09-20 LAB — VITAMIN B12: Vitamin B-12: 5696 pg/mL — ABNORMAL HIGH (ref 180–914)

## 2021-09-20 LAB — IRON AND TIBC
Iron: 28 ug/dL (ref 28–170)
Saturation Ratios: 17 % (ref 10.4–31.8)
TIBC: 165 ug/dL — ABNORMAL LOW (ref 250–450)
UIBC: 137 ug/dL

## 2021-09-20 LAB — FERRITIN: Ferritin: 420 ng/mL — ABNORMAL HIGH (ref 11–307)

## 2021-09-20 LAB — MAGNESIUM: Magnesium: 1.6 mg/dL — ABNORMAL LOW (ref 1.7–2.4)

## 2021-09-20 LAB — PREPARE FRESH FROZEN PLASMA: Unit division: 0

## 2021-09-20 LAB — FOLATE: Folate: 13.7 ng/mL (ref >5.9)

## 2021-09-20 LAB — PHOSPHORUS: Phosphorus: 2.1 mg/dL — ABNORMAL LOW (ref 2.5–4.6)

## 2021-09-20 MED ORDER — TRAVASOL 10 % IV SOLN
INTRAVENOUS | Status: AC
  Filled 2021-09-20: qty 900.1

## 2021-09-20 MED ORDER — MAGNESIUM SULFATE 2 GM/50ML IV SOLN
2.0000 g | Freq: Once | INTRAVENOUS | Status: AC
  Administered 2021-09-20: 2 g via INTRAVENOUS
  Filled 2021-09-20: qty 50

## 2021-09-20 MED ORDER — PIPERACILLIN-TAZOBACTAM 3.375 G IVPB
3.3750 g | Freq: Three times a day (TID) | INTRAVENOUS | Status: DC
  Administered 2021-09-20 – 2021-10-03 (×39): 3.375 g via INTRAVENOUS
  Filled 2021-09-20 (×39): qty 50

## 2021-09-20 NOTE — Consult Note (Signed)
PHARMACY - TOTAL PARENTERAL NUTRITION CONSULT NOTE  ? ?Indication: Prolonged ileus ? ?Patient Measurements: ?Height: '5\' 2"'$  (157.5 cm) ?Weight: 41.6 kg (91 lb 11.4 oz) ?IBW/kg (Calculated) : 50.1 ?TPN AdjBW (KG): 53.5 ?Body mass index is 16.77 kg/m?. ?Usual Weight: 55kg ? ?Assessment: [name] POD4 from [facility] repair of non-healing perforated peptic ulcer c/b prolonged ileus. GJ tube placed 3/19 and Pharmacy consulted for mgmt of TPN and electrolytes. ? ?Glucose / Insulin: BG previous 24h 119 - 168  ?--5 units SSI required ?Electrolytes: hypernatremia, hypomagnesemia, hypophosphatemia ?Renal: Scr 2.63-->0.85 ?Hepatic: LFTs wnl ?Intake / Output; MIVF: no MIVF ?GI Imaging: ?3/19 CT abd -- Anterior antral gastric wall perforation with associated moderate volume pneumoperitoneum, small volume ascites, extravasated PO contrast. Colonic diverticulosis & Chronic pancreatitis. ? ?3/16 CT abd/pelvis:  Decreased pneumoperitoneum, Phillip Heal patch repair of the perforated ?distal stomach, postoperative drain at the greater omentum. Small volume mostly simple density fluid in the abdomen is primarily around the liver. No bowel obstruction. Chronic calcific pancreatitis ? ?GI Surgeries / Procedures: 03/19 exploratory laparotomy ? ?Central access: 09/18/21 ?TPN start date: 09/18/21 ? ?Nutritional Goals: ?Goal TPN rate is 65 mL/hr (provides 90.1 g of protein and 1676.7 kcals per day) ? ?RD Assessment: ?Estimated Needs ?Total Energy Estimated Needs: 1600-1800kcal/day ?Total Protein Estimated Needs: 80-90g/day ?Total Fluid Estimated Needs: 1.6-1.8L/day ? ?Current Nutrition:  ?NPO ? ?Plan:  ?continue TPN at goal rate of 65 mL/hr at 1800 (total volume including overfill 1660 mL) ?Nutritional Components ?Amino acids (using 10% Travasol): 90.1 grams ?Dextrose: 249.6 grams ?Lipids (using 20% SMOFlipids): 46.8 grams ?Electrolytes in TPN: Na 63mq/L, K 460m/L, Ca 54m41mL, Mg 54mE20m, and Phos 20mm30m. Cl:Ac 1:2 ?Add standard MVI and trace  elements to TPN ?Add thiamine '100mg'$  daily x3d (d23/3d) ?2 grams IV magnesium sulfate x 1 ?Continue Sensitive SSI to q6h and adjust as needed  ?Monitor TPN labs on Mon/Thurs when stable, daily until then ? ?RodneDallie Piles2/2023,7:45 AM ? ?

## 2021-09-20 NOTE — Assessment & Plan Note (Signed)
With pneumoperitoneum and intra-abdominal abscesses s/p exploratory laparotomy twice as first time she was continued to have leak.  First surgery was on 09/13/2021 and second was on 09/17/2021. ?-Patient will remain n.p.o. for bowel rest for another week and started on TPN. ?-NG tube in place, it was replaced after accidental fall overnight. ?-Continue TPN ?-Continue with Zosyn and Diflucan-being managed by surgery. ?-Continue with pain management and supportive care ?

## 2021-09-20 NOTE — Progress Notes (Addendum)
Subjective:  ?CC: ?Maria [name] is a 51 y.o. female  Hospital stay day 6, 3 Days Post-Op exploratory laparotomy, redo peptic ulcer perforation repair, G and J tube placement. ? ?HPI: ?NG accidentally pulled and replaced.  Some increased bloody drainage from JP.  Pt states pain remains about the same. ? ?ROS:  ?General: Denies weight loss, weight gain, fatigue, fevers, chills, and night sweats. ?Heart: Denies chest pain, palpitations, racing heart, irregular heartbeat, leg pain or swelling, and decreased activity tolerance. ?Respiratory: Denies breathing difficulty, shortness of breath, wheezing, cough, and sputum. ?GI: Denies change in appetite, heartburn, nausea, vomiting, constipation, diarrhea, and blood in stool. ?GU: Denies difficulty urinating, pain with urinating, urgency, frequency, blood in urine. ? ? ?Objective:  ? ?Temp:  [97.6 ?F (36.4 ?C)-98.6 ?F (37 ?C)] 97.9 ?F (36.6 ?C) (03/22 4970) ?Pulse Rate:  [62-78] 72 (03/22 0817) ?Resp:  [17-18] 17 (03/22 0817) ?BP: (153-176)/(72-78) 157/72 (03/22 0817) ?SpO2:  [93 %-100 %] 98 % (03/22 0817)     Height: '5\' 2"'$  (157.5 cm) Weight: 41.6 kg BMI (Calculated): 16.77  ? ?Intake/Output this shift:  ? ?Intake/Output Summary (Last 24 hours) at 09/20/2021 1412 ?Last data filed at 09/20/2021 1154 ?Gross per 24 hour  ?Intake 2503.45 ml  ?Output 985 ml  ?Net 1518.45 ml  ? ? ?Constitutional :  alert, cooperative, appears stated age, and no distress  ?Respiratory:  clear to auscultation bilaterally  ?Cardiovascular:  regular rate and rhythm  ?Gastrointestinal: Soft, some TTP around incision, no guarding JP with less sanguinous  drainage today, NG with bilious output .   ?Skin: Cool and moist. Staples c/d/i  ?Psychiatric: Normal affect, non-agitated, not confused  ?   ?  ?LABS:  ? ?  Latest Ref Rng & Units 09/20/2021  ?  8:36 AM 09/19/2021  ?  4:39 AM 09/18/2021  ?  3:49 AM  ?CMP  ?Glucose 70 - 99 mg/dL 154   168   207    ?BUN 6 - 20 mg/dL 30   41   51    ?Creatinine 0.44 - 1.00  mg/dL 0.85   1.22   1.49    ?Sodium 135 - 145 mmol/L 147   150   143    ?Potassium 3.5 - 5.1 mmol/L 4.3   4.4   4.5    ?Chloride 98 - 111 mmol/L 119   122   115    ?CO2 22 - 32 mmol/L '22   23   21    '$ ?Calcium 8.9 - 10.3 mg/dL 8.6   8.4   7.8    ?Total Protein 6.5 - 8.1 g/dL 5.6   5.6   4.7    ?Total Bilirubin 0.3 - 1.2 mg/dL 1.0   0.8   1.4    ?Alkaline Phos 38 - 126 U/L 139   137   160    ?AST 15 - 41 U/L '27   22   21    '$ ?ALT 0 - 44 U/L '15   15   13    '$ ? ? ?  Latest Ref Rng & Units 09/20/2021  ?  8:36 AM 09/20/2021  ?  5:45 AM 09/19/2021  ?  9:56 PM  ?CBC  ?WBC 4.0 - 10.5 K/uL 12.1   11.2     ?Hemoglobin 12.0 - 15.0 g/dL 8.6   8.3   7.3    ?Hematocrit 36.0 - 46.0 % 27.0   25.3   22.1    ?Platelets 150 - 400 K/uL 130  125     ? ? ?RADS: ?CLINICAL DATA:  Postoperative intra-bleed, unable to localized ?during repair of peptic ulcer perforation. GI bleed protocol ?requested. ?  ?EXAM: ?CTA ABDOMEN AND PELVIS WITHOUT AND WITH CONTRAST ?  ?TECHNIQUE: ?Multidetector CT imaging of the abdomen and pelvis was performed ?using the standard protocol during bolus administration of ?intravenous contrast. Multiplanar reconstructed images and MIPs were ?obtained and reviewed to evaluate the vascular anatomy. ?  ?RADIATION DOSE REDUCTION: This exam was performed according to the ?departmental dose-optimization program which includes automated ?exposure control, adjustment of the mA and/or kV according to ?patient size and/or use of iterative reconstruction technique. ?  ?CONTRAST:  23m OMNIPAQUE IOHEXOL 350 MG/ML SOLN ?  ?COMPARISON:  The CT abdomen and pelvis with contrast 09/14/2021, and ?CT abdomen with oral contrast only 09/17/2021 . ?  ?FINDINGS: ?VASCULAR ?  ?Aorta: Normal caliber aorta without aneurysm, dissection, vasculitis ?or significant stenosis. There is mild atherosclerosis. ?  ?Celiac: Patent without evidence of aneurysm, dissection, vasculitis ?or significant stenosis. ?  ?SMA: Patent without evidence of aneurysm,  dissection, vasculitis or ?significant stenosis. ?  ?Renals: Both renal arteries are patent without evidence of aneurysm, ?dissection, vasculitis, fibromuscular dysplasia or significant ?stenosis. There are bilateral single renal arteries. Left renal ?artery demonstrates nonstenosing ostial calcification anteriorly ?with no other calcifications. ?  ?IMA: Patent without evidence of aneurysm, dissection, vasculitis or ?significant stenosis. ?  ?Inflow: Patent without evidence of aneurysm, dissection, vasculitis ?or significant stenosis. There is mild atherosclerosis of the distal ?left common iliac artery mild-to-moderate atherosclerosis in both ?internal iliac arteries. No significant external iliac artery ?findings. ?  ?Proximal Outflow: Bilateral common femoral and visualized portions ?of the superficial and profunda femoral arteries are patent without ?evidence of aneurysm, dissection, vasculitis or significant ?stenosis. There are nonstenosing calcifications in the posterior ?wall in both common femoral arteries. ?  ?Veins: The venous systems are patent. ?  ?Review of the MIP images confirms the above findings. ?  ?NON-VASCULAR ?  ?Lower chest: There are small bilaterally increased ?left-greater-than-right layering pleural effusions, with again noted ?adjacent atelectasis or consolidation in the lower lobes. The heart ?is slightly enlarged. The intraventricular blood pool is low in ?density consistent with anemia on the noncontrast exam. ?  ?Hepatobiliary: The liver enhances uniformly but does have somewhat ?of borderline cirrhotic appearance. Gallbladder is distended with ?dense material which could be residual vicariously excreted contrast ?or dense sludge. There is no wall thickening or biliary dilatation. ?  ?Pancreas: Diffusely atrophic calcifications consistent chronic ?pancreatitis in the head and neck, chronic mild prominence of the ?pancreatic duct. No adjacent inflammation or mass. ?  ?Spleen: No mass  enhancement or splenomegaly. ?  ?Adrenals/Urinary Tract: There is no adrenal mass , no abnormality in ?the renal cortex. No evidence for urinary stone or obstruction. ?Unremarkable bladder. ?  ?Stomach/Bowel: There was previously an anterior gastric antral ?perforation which is not seen today any longer, with newly noted ?surgical drain entering from the left extending underneath and ?behind the liver with its tip in the lesser sac region. ?  ?If there is a new gastrointestinal tract perforation it is not ?readily apparent. Thickened folds continues to be seen in the ?stomach as well as a PEG tube entering from the left in the body of ?the stomach. NGT is no longer within the stomach. No small bowel ?dilatation or inflammation is seen. There is a normal caliber ?appendix with appendix and colon filled with contrast, with ?uncomplicated sigmoid diverticula. ?  ?Lymphatic: No adenopathy  is seen. ?  ?Reproductive: Surgically absent uterus. No adnexal mass. The ovaries ?are not enlarged. ?  ?Other: There is increased generalized body wall anasarca. There is ?increased mesenteric edema in the upper to mid abdomen which is ?probably postoperative in etiology, alternatively could be from ?peritonitis. ?  ?There are midline laparotomy skin staples. Central abdominal ?surgical drain is again present in addition to the above. ?  ?There is moderate free air in the mid and upper abdomen anteriorly, ?some of which tracks into the left subphrenic space. The volume of ?free overall does appear less than previously and there is no longer ?visible free contrast. ?  ?There was a sizable air-contrast collection in the anterior left ?upper abdomen which is reduced in size without visible contrast. ?  ?Complex fluid consistent with hemoperitoneum again surrounds the ?liver and tracts in the left subphrenic space but is overall ?decreasing in thickness, the previous maximum diameter of the fluid ?was 2.6 cm now is 1.5 cm. ?  ?There is  evidence of pelvic floor laxity but no evidence of ?cystocele. There is low-density mild layering fluid in the posterior ?deep pelvis. ?  ?Musculoskeletal: No acute or significant osseous findings. ?  ?IMPRE

## 2021-09-20 NOTE — Assessment & Plan Note (Signed)
Blood pressure mildly elevated .  Echocardiogram normal. ?-Continue with as needed hydralazine ?

## 2021-09-20 NOTE — Telephone Encounter (Signed)
Patient still in hospital will call us when she gets out  ?

## 2021-09-20 NOTE — Assessment & Plan Note (Signed)
Estimated body mass index is 16.77 kg/m? as calculated from the following: ?  Height as of this encounter: '5\' 2"'$  (1.575 m). ?  Weight as of this encounter: 41.6 kg.  ? ?-Patient is being started on TPN ?

## 2021-09-20 NOTE — Progress Notes (Signed)
?Progress Note ? ? ?Patient: Maria [name] XBW:[id number] DOB: [birth year] DOA: 09/13/2021     6 ?DOS: the patient was seen and examined on 09/20/2021 ?  ?Brief hospital course: ?Taken from prior notes. ? ?Airanna Partin is a 51 y.o. female with PMH of HTN, pancreatitis, GERD/gastritis, anxiety, depression, alcohol abuse presented at [facility] ED with abdominal pain.  Patient was admitted on 09/13/2021 under general surgery due to pneumoperitoneum secondary to perforated peptic ulcer, patient was taken to the OR s/p ex lap with primary closure of perforated peptic ulcer with Phillip Heal patch repair with intra-abdominal abscess drainage.  Postop patient became unresponsive so code stroke was called but work-up was negative.  Patient was in the ICU, downgraded on the medical floor on 09/16/2021. ? ?Patient underwent another exploratory laparotomy yesterday on 09/17/2021 when peptic ulcer perforation repair was redone along with G and J-tube placement, intra-abdominal abscess was also drained. ? ?NG tube in place. ?Patient was started on TPN today, 09/18/2021 as surgery would like to give her a bowel rest for at least for 1 week. ? ?Foley catheter in place-surgery wants to keep for another 1 to 2 days. ?PICC line was placed on 09/18/2021 to start TPN ? ?3/21: Patient overall stable, labs with hypernatremia with sodium of 150.  She was not on any IV fluid except TPN, giving some D5. ?Surgery wants to give bowel rest for 1 week. ?Foley can be removed today. ? ?3/22: Overnight NG tube was accidentally fallen off and was replaced by nursing staff.  KUB with some air under the diaphragm, per surgery that is normal but they are recommending not to replace NG tube without notifying them first as she is prone to more perforation. ? ? ?Assessment and Plan: ?* Perforated peptic ulcer ([place]) ?With pneumoperitoneum and intra-abdominal abscesses s/p exploratory laparotomy twice as first time she was continued to have leak.  First surgery was on  09/13/2021 and second was on 09/17/2021. ?-Patient will remain n.p.o. for bowel rest for another week and started on TPN. ?-NG tube in place, it was replaced after accidental fall overnight. ?-Continue TPN ?-Continue with Zosyn and Diflucan-being managed by surgery. ?-Continue with pain management and supportive care ? ?Malnutrition of moderate degree ?Estimated body mass index is 16.77 kg/m? as calculated from the following: ?  Height as of this encounter: '5\' 2"'$  (1.575 m). ?  Weight as of this encounter: 41.6 kg.  ? ?-Patient is being started on TPN ? ?Essential hypertension ?Blood pressure mildly elevated .  Echocardiogram normal. ?-Continue with as needed hydralazine ? ?AKI (acute kidney injury) ([place]) ?Resolved.  Creatinine normalized ?-Monitor renal function ?-Avoid nephrotoxins ? ?Vitamin D deficiency ?Vitamin D level at 11.5. ?-We will start vitamin D 50,000 units p.o. weekly once able to take p.o. ? ?Alcohol abuse ?No withdrawal symptoms. ?-Continue with IV folic acid and thiamine.  We will switch to p.o. once able to take  Oral. ?-Continue with CIWA protocol ? ?Acute postoperative anemia due to expected blood loss ?Patient received 2 unit of PRBC when her hemoglobin dropped to 6.4 after second surgery.  She also received 1 unit of FFP due to INR of 1.7. ?Hemoglobin improved to 8 today. ?-Monitor hemoglobin ?-Transfuse if below 7 ?-Check anemia panel ? ?Hypernatremia ?Sodium increased to corrected 151 today, free water deficit of 1.6 L.  She is on TPN. ?-Will give her 1 L of D5 ?-Monitor sodium ? ?  ? ?Subjective: Patient was feeling little tired and wants to get some sleep when seen  today.  Mild to moderate abdominal discomfort.  She was asking for ice chips.  Passing flatus. ? ?Physical Exam: ?Vitals:  ? 09/20/21 0136 09/20/21 0356 09/20/21 0817 09/20/21 1530  ?BP: (!) 176/76 (!) 171/78 (!) 157/72 (!) 148/74  ?Pulse: 66 71 72 72  ?Resp: '17 17 17 16  '$ ?Temp: 97.8 ?F (36.6 ?C) 98.6 ?F (37 ?C) 97.9 ?F (36.6 ?C)  98 ?F (36.7 ?C)  ?TempSrc: Oral Oral Oral Oral  ?SpO2: 99% 98% 98% 98%  ?Weight:      ?Height:      ? ?General.  Well-developed lady, in no acute distress.  NG tube in place ?Pulmonary.  Lungs clear bilaterally, normal respiratory effort. ?CV.  Regular rate and rhythm, no JVD, rub or murmur. ?Abdomen.  Soft, nontender, nondistended, BS positive on right and hypoactive on left, two drains in place. ?CNS.  Alert and oriented .  No focal neurologic deficit. ?Extremities.  No edema, no cyanosis, pulses intact and symmetrical. ?Psychiatry.  Judgment and insight appears normal. ? ?Data Reviewed: ?Prior notes, labs and images reviewed ? ?Family Communication: Discussed with patient ? ?Disposition: ?Status is: Inpatient ?Remains inpatient appropriate because: Severity of illness ? ? Planned Discharge Destination: Home ? ?Time spent: 45 minutes ? ?This record has been created using Systems analyst. Errors have been sought and corrected,but may not always be located. Such creation errors do not reflect on the standard of care. ? ?Author: ?Lorella Nimrod, MD ?09/20/2021 5:17 PM ? ?For on call review [email or web address].  ?

## 2021-09-20 NOTE — Progress Notes (Addendum)
?[facility]  ?PROGRESS NOTE  ? ?Subjective:  ? ?Patient seen sitting up in bed ?Remains n.p.o. ?NGT displaced yesterday, replaced today ? ?Creatinine within normal range, 0.85 ?Urine output 1.2 L recorded in 24 hours ?Sodium 147 ? ?Objective:  ?Vital signs: ?Blood pressure (!) 157/72, pulse 72, temperature 97.9 ?F (36.6 ?C), temperature source Oral, resp. rate 17, height '5\' 2"'$  (1.575 m), weight 41.6 kg, last menstrual period 02/24/2018, SpO2 98 %. ? ?Intake/Output Summary (Last 24 hours) at 09/20/2021 1051 ?Last data filed at 09/20/2021 4098 ?Gross per 24 hour  ?Intake 2859.45 ml  ?Output 1200 ml  ?Net 1659.45 ml  ? ? ?Filed Weights  ? 09/17/21 0447 09/18/21 0500 09/19/21 0500  ?Weight: 60 kg 46 kg 41.6 kg  ? ? ? ?Physical Exam: ?General:  No acute distress  ?Head:  Normocephalic, atraumatic. Moist oral mucosal membranes, NGT  ?Eyes:  Anicteric  ?Lungs:   Clear to auscultation, normal effort  ?Heart:  S1S2 no rubs  ?Abdomen:  S/p surgery, JP drain  ?Extremities:  No peripheral edema.  ?Neurologic:  Awake, alert, following commands  ?Skin:  No lesions  ?   ? ? ?Basic Metabolic Panel: ?Recent Labs  ?Lab 09/16/21 ?1191 09/17/21 ?0507 09/18/21 ?4782 09/19/21 ?9562 09/20/21 ?1308  ?NA 138 141 143 150* 147*  ?K 4.0 3.8 4.5 4.4 4.3  ?CL 108 114* 115* 122* 119*  ?CO2 19* 22 21* 23 22  ?GLUCOSE 92 128* 207* 168* 154*  ?BUN 82* 71* 51* 41* 30*  ?CREATININE 2.63* 2.20* 1.49* 1.22* 0.85  ?CALCIUM 8.5* 8.5* 7.8* 8.4* 8.6*  ?MG 2.3 2.1 1.6* 2.4 1.6*  ?PHOS 7.9* 4.4 4.8* 2.7 2.1*  ? ? ? ?CBC: ?Recent Labs  ?Lab 09/13/21 ?1822 09/14/21 ?0051 09/15/21 ?0343 09/17/21 ?0507 09/18/21 ?6578 09/19/21 ?0439 09/19/21 ?2156 09/20/21 ?4696 09/20/21 ?2952  ?WBC 20.2* 32.2*   < > 7.6 14.1* 14.5*  --  11.2* 12.1*  ?NEUTROABS 17.4* 29.2*  --   --   --   --   --   --   --   ?HGB 11.4* 10.8*   < > 8.4* 6.4* 8.0* 7.3* 8.3* 8.6*  ?HCT 32.1* 31.2*   < > 26.1* 20.0* 24.5* 22.1* 25.3* 27.0*  ?MCV 100.0 102.6*   < > 108.3* 112.4* 101.7*  --   105.4* 100.7*  ?PLT 147* 196   < > 130* 124* 138*  --  125* 130*  ? < > = values in this interval not displayed.  ? ? ? ? ?Urinalysis: ?No results for input(s): COLORURINE, LABSPEC, [place], GLUCOSEU, HGBUR, BILIRUBINUR, KETONESUR, PROTEINUR, UROBILINOGEN, NITRITE, LEUKOCYTESUR in the last 72 hours. ? ?Invalid input(s): APPERANCEUR  ? ? ?Imaging: ?DG Abd 1 View ? ?Addendum Date: 09/19/2021   ?ADDENDUM REPORT: 09/19/2021 20:17 ADDENDUM: Discussed case over the phone in detail with nurse practitioner Foust at 8:17 p.m., 09/19/2021, with acknowledgement of findings. Electronically Signed   By: Telford Nab M.D.   On: 09/19/2021 20:17  ? ?Result Date: 09/19/2021 ?CLINICAL DATA:  Perforated gastric ulcer patch repair march 19. Drain left in place. Check NGT positioning. EXAM: ABDOMEN - 1 VIEW COMPARISON:  Similar study 09/17/2021. FINDINGS: NGT tip is in the distal gastric body or proximal antrum. Percutaneous gastrostomy tube is again noted lateral to this, left mid abdomen and appears unchanged. Surgical drain superimposes in the central upper abdomen and there are overlying skin staples. The bowel gas pattern is not well evaluated as the lower abdomen and pelvis were excluded from view.  In the upper abdomen no dilated bowel is seen. There are increased air pockets underneath the left hemidiaphragm, unknown if this is redistributed free air from the laparotomy or new free air from a perforated hollow viscus. No free air is seen beneath the right hemidiaphragm. The splenic flexure of the colon does not extend this high on the most recent CT abdomen March 19. Also the stomach does not extend this far laterally. Clinical correlation is recommended. CT can be repeated if needed for further study. Small pleural effusions are incidentally noted. The heart is slightly enlarged. There is left lower lobe retrocardiac streaky atelectasis or consolidation. IMPRESSION: 1. There are new air pockets underneath the left hemidiaphragm  which could be redistributed residual free air from the laparotomy or new free air from intestinal perforation or postsurgical leak of the stomach. Clinical correlation advised. CT can be repeated if needed. 2. NGT tip is in the distal body of stomach or proximal gastric antrum. Percutaneous gastrostomy and drainage tube again noted. 3. Small pleural effusions, with streaky left lower lobe retrocardiac atelectasis or consolidation. Electronically Signed: By: Telford Nab M.D. On: 09/19/2021 20:12  ? ?CT Angio Abd/Pel w/ and/or w/o ? ?Result Date: 09/20/2021 ?CLINICAL DATA:  Postoperative intra-bleed, unable to localized during repair of peptic ulcer perforation. GI bleed protocol requested. EXAM: CTA ABDOMEN AND PELVIS WITHOUT AND WITH CONTRAST TECHNIQUE: Multidetector CT imaging of the abdomen and pelvis was performed using the standard protocol during bolus administration of intravenous contrast. Multiplanar reconstructed images and MIPs were obtained and reviewed to evaluate the vascular anatomy. RADIATION DOSE REDUCTION: This exam was performed according to the departmental dose-optimization program which includes automated exposure control, adjustment of the mA and/or kV according to patient size and/or use of iterative reconstruction technique. CONTRAST:  4m OMNIPAQUE IOHEXOL 350 MG/ML SOLN COMPARISON:  The CT abdomen and pelvis with contrast 09/14/2021, and CT abdomen with oral contrast only 09/17/2021 . FINDINGS: VASCULAR Aorta: Normal caliber aorta without aneurysm, dissection, vasculitis or significant stenosis. There is mild atherosclerosis. Celiac: Patent without evidence of aneurysm, dissection, vasculitis or significant stenosis. SMA: Patent without evidence of aneurysm, dissection, vasculitis or significant stenosis. Renals: Both renal arteries are patent without evidence of aneurysm, dissection, vasculitis, fibromuscular dysplasia or significant stenosis. There are bilateral single renal arteries.  Left renal artery demonstrates nonstenosing ostial calcification anteriorly with no other calcifications. IMA: Patent without evidence of aneurysm, dissection, vasculitis or significant stenosis. Inflow: Patent without evidence of aneurysm, dissection, vasculitis or significant stenosis. There is mild atherosclerosis of the distal left common iliac artery mild-to-moderate atherosclerosis in both internal iliac arteries. No significant external iliac artery findings. Proximal Outflow: Bilateral common femoral and visualized portions of the superficial and profunda femoral arteries are patent without evidence of aneurysm, dissection, vasculitis or significant stenosis. There are nonstenosing calcifications in the posterior wall in both common femoral arteries. Veins: The venous systems are patent. Review of the MIP images confirms the above findings. NON-VASCULAR Lower chest: There are small bilaterally increased left-greater-than-right layering pleural effusions, with again noted adjacent atelectasis or consolidation in the lower lobes. The heart is slightly enlarged. The intraventricular blood pool is low in density consistent with anemia on the noncontrast exam. Hepatobiliary: The liver enhances uniformly but does have somewhat of borderline cirrhotic appearance. Gallbladder is distended with dense material which could be residual vicariously excreted contrast or dense sludge. There is no wall thickening or biliary dilatation. Pancreas: Diffusely atrophic calcifications consistent chronic pancreatitis in the head and neck, chronic mild  prominence of the pancreatic duct. No adjacent inflammation or mass. Spleen: No mass enhancement or splenomegaly. Adrenals/Urinary Tract: There is no adrenal mass , no abnormality in the renal cortex. No evidence for urinary stone or obstruction. Unremarkable bladder. Stomach/Bowel: There was previously an anterior gastric antral perforation which is not seen today any longer, with  newly noted surgical drain entering from the left extending underneath and behind the liver with its tip in the lesser sac region. If there is a new gastrointestinal tract perforation it is not read

## 2021-09-20 NOTE — Assessment & Plan Note (Signed)
Resolved.  Creatinine normalized ?-Monitor renal function ?-Avoid nephrotoxins ?

## 2021-09-20 NOTE — Progress Notes (Signed)
Nutrition Follow Up Note  ? ?DOCUMENTATION CODES:  ? ?Non-severe (moderate) malnutrition in context of social or environmental circumstances ? ?INTERVENTION:  ? ?Continue TPN per pharmacy- currently at goal rate  ? ?Daily weights  ? ?Once appropriate for tube feeds, recommend: ? ?Osmolite 1.5'@50ml' /hr- Initiate at 31m/hr and increase by 138mhr q 8 hours until goal rate is reached.  ? ?Pro-Source 4569maily via tube, provides 40kcal and 11g of protein per serving  ? ?Free water flushes 68m74m hours to maintain tube patency  ? ?Regimen provides 1840kcal/day, 86g/day protein and 1094ml49m of free water  ? ?Supplement vitamin D once pt can resume enteral nutrition  ? ?NUTRITION DIAGNOSIS:  ? 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 ? ?GOAL:  ? ?Patient will meet greater than or equal to 90% of their needs ?-not met  ? ?MONITOR:  ? ?Diet advancement, Labs, Weight trends, Skin, I & O's, TPN ? ?ASSESSMENT:  ? ?51 y/88female with h/o GERD, anxiety, depression, etoh abuse and HTN who is admitted with perforated duodenal ulcer s/p Graham patch repair 3/15. ? ?Pt s/p exploratory laparotomy, primary closure of perforated peptic ulcer Graham patch repair, intraabdominal abscess drainage, 82F gastrostomy tube insertion, Jejunostomy tube insertion (18 French Foley catheter) 3/19 ? ?Pt tolerating TPN at goal rate. Pt continues to refeed; electrolytes being managed by pharmacy. Pt with hypernatremia likely secondary to refeeding. Triglycerides slightly elevated; will monitor. NGT replaced and in place with 350ml 92mut. Drains with 135ml o15mt. UOP improving with 1200ml ou53m. Per chart, pt is documented to be down ~25lbs since admit; RD unsure if bed weights are correct. Pt +10.3L on her I & Os. Spoke with MD, plan is for strict bowel rest (nothing by mouth or feeds via  J-tube) for at least a week. Will plan to take it one day at a time to decide about starting tube feeds. No BM noted since 3/19. ? ?Of note, pt with vitamin D deficiency; will plan to supplement once patient can resume enteral nutrition.   ? ?Medications reviewed and include: folic acid, insulin, protonix, diflucan, zosyn ? ?Labs reviewed: Na 147(H), K 4.3 wnl, BUN 30(H), P 2.1(L), Mg 1.6(L) ?Triglycerides- 160- 3/21 ?Vitamin D 11.51(L)- 3/18 ?Wbc- 12.1(H), Hgb 8.6(L), Hct 27.0(L) ?Cbgs- 141, 119, 145, 136 x 24 hrs ? ?Diet Order:   ?Diet Order   ? ?       ?  Diet NPO time specified  Diet effective now       ?  ? ?  ?  ? ?  ? ?EDUCATION NEEDS:  ? ?No education needs have been identified at this time ? ?Skin:  Skin Assessment: Reviewed RN Assessment (incision abdomen) ? ?Last BM:  3/19- type 6 ? ?Height:  ? ?Ht Readings from Last 1 Encounters:  ?09/14/21 '5\' 2"'  (1.575 m)  ? ? ?Weight:  ? ?Wt Readings from Last 1 Encounters:  ?09/19/21 41.6 kg  ? ? ?Ideal Body Weight:  50 kg ? ?BMI:  Body mass index is 16.77 kg/m?. ? ?Estimated Nutritional Needs:  ? ?Kcal:  1600-1800kcal/day ? ?Protein:  80-90g/day ? ?Fluid:  1.6-1.8L/day ? ?Gentry Pilson CaKoleen Distance LDN ?Please refer to [facility] for RD and/or RD on-call/weekend/after hours pager ? ?

## 2021-09-21 ENCOUNTER — Encounter: Payer: Self-pay | Admitting: Surgery

## 2021-09-21 DIAGNOSIS — K275 Chronic or unspecified peptic ulcer, site unspecified, with perforation: Secondary | ICD-10-CM | POA: Diagnosis not present

## 2021-09-21 LAB — BPAM RBC
Blood Product Expiration Date: 202304192359
Blood Product Expiration Date: 202304192359
Blood Product Expiration Date: 202304242359
ISSUE DATE / TIME: 202303200936
ISSUE DATE / TIME: 202303201731
ISSUE DATE / TIME: 202303220103
Unit Type and Rh: 5100
Unit Type and Rh: 5100
Unit Type and Rh: 5100

## 2021-09-21 LAB — COMPREHENSIVE METABOLIC PANEL
ALT: 15 U/L (ref 0–44)
AST: 25 U/L (ref 15–41)
Albumin: 1.9 g/dL — ABNORMAL LOW (ref 3.5–5.0)
Alkaline Phosphatase: 129 U/L — ABNORMAL HIGH (ref 38–126)
Anion gap: 5 (ref 5–15)
BUN: 25 mg/dL — ABNORMAL HIGH (ref 6–20)
CO2: 22 mmol/L (ref 22–32)
Calcium: 7.9 mg/dL — ABNORMAL LOW (ref 8.9–10.3)
Chloride: 122 mmol/L — ABNORMAL HIGH (ref 98–111)
Creatinine, Ser: 0.76 mg/dL (ref 0.44–1.00)
GFR, Estimated: >60 mL/min (ref >60)
Glucose, Bld: 111 mg/dL — ABNORMAL HIGH (ref 70–99)
Potassium: 4.1 mmol/L (ref 3.5–5.1)
Sodium: 149 mmol/L — ABNORMAL HIGH (ref 135–145)
Total Bilirubin: 0.9 mg/dL (ref 0.3–1.2)
Total Protein: 5.1 g/dL — ABNORMAL LOW (ref 6.5–8.1)

## 2021-09-21 LAB — CBC
HCT: 26.2 % — ABNORMAL LOW (ref 36.0–46.0)
Hemoglobin: 8.4 g/dL — ABNORMAL LOW (ref 12.0–15.0)
MCH: 32.6 pg (ref 26.0–34.0)
MCHC: 32.1 g/dL (ref 30.0–36.0)
MCV: 101.6 fL — ABNORMAL HIGH (ref 80.0–100.0)
Platelets: 125 10*3/uL — ABNORMAL LOW (ref 150–400)
RBC: 2.58 MIL/uL — ABNORMAL LOW (ref 3.87–5.11)
RDW: 19.2 % — ABNORMAL HIGH (ref 11.5–15.5)
WBC: 12.7 10*3/uL — ABNORMAL HIGH (ref 4.0–10.5)
nRBC: 0 % (ref 0.0–0.2)

## 2021-09-21 LAB — TYPE AND SCREEN
ABO/RH(D): O POS
Antibody Screen: NEGATIVE
Unit division: 0
Unit division: 0
Unit division: 0

## 2021-09-21 LAB — GLUCOSE, CAPILLARY
Glucose-Capillary: 120 mg/dL — ABNORMAL HIGH (ref 70–99)
Glucose-Capillary: 134 mg/dL — ABNORMAL HIGH (ref 70–99)
Glucose-Capillary: 139 mg/dL — ABNORMAL HIGH (ref 70–99)
Glucose-Capillary: 151 mg/dL — ABNORMAL HIGH (ref 70–99)
Glucose-Capillary: 158 mg/dL — ABNORMAL HIGH (ref 70–99)

## 2021-09-21 LAB — PROTIME-INR
INR: 1.2 (ref 0.8–1.2)
Prothrombin Time: 15.2 seconds (ref 11.4–15.2)

## 2021-09-21 LAB — MAGNESIUM: Magnesium: 1.6 mg/dL — ABNORMAL LOW (ref 1.7–2.4)

## 2021-09-21 LAB — TRIGLYCERIDES: Triglycerides: 126 mg/dL (ref <150)

## 2021-09-21 LAB — PHOSPHORUS: Phosphorus: 2.4 mg/dL — ABNORMAL LOW (ref 2.5–4.6)

## 2021-09-21 MED ORDER — DEXTROSE 5 % IV SOLN: INTRAVENOUS | Status: AC

## 2021-09-21 MED ORDER — HYDRALAZINE HCL 20 MG/ML IJ SOLN
10.0000 mg | Freq: Three times a day (TID) | INTRAMUSCULAR | Status: DC
  Administered 2021-09-21 – 2021-10-11 (×59): 10 mg via INTRAVENOUS
  Filled 2021-09-21 (×60): qty 1

## 2021-09-21 MED ORDER — MAGNESIUM SULFATE 2 GM/50ML IV SOLN
2.0000 g | Freq: Once | INTRAVENOUS | Status: AC
  Administered 2021-09-21: 2 g via INTRAVENOUS
  Filled 2021-09-21: qty 50

## 2021-09-21 MED ORDER — TRAVASOL 10 % IV SOLN
INTRAVENOUS | Status: AC
  Filled 2021-09-21: qty 900.1

## 2021-09-21 NOTE — Progress Notes (Signed)
Subjective:  ?CC: ?Maria [name] is a 51 y.o. female  Hospital stay day 7, 4 Days Post-Op exploratory laparotomy, redo peptic ulcer perforation repair, G and J tube placement. ? ?HPI: ?No acute issues overnight reported.  Pain remains stable ? ?ROS:  ?General: Denies weight loss, weight gain, fatigue, fevers, chills, and night sweats. ?Heart: Denies chest pain, palpitations, racing heart, irregular heartbeat, leg pain or swelling, and decreased activity tolerance. ?Respiratory: Denies breathing difficulty, shortness of breath, wheezing, cough, and sputum. ?GI: Denies change in appetite, heartburn, nausea, vomiting, constipation, diarrhea, and blood in stool. ?GU: Denies difficulty urinating, pain with urinating, urgency, frequency, blood in urine. ? ? ?Objective:  ? ?Temp:  [97.8 ?F (36.6 ?C)-98.7 ?F (37.1 ?C)] 98.5 ?F (36.9 ?C) (03/23 0955) ?Pulse Rate:  [66-72] 66 (03/23 0955) ?Resp:  [16-18] 16 (03/23 0955) ?BP: (148-175)/(72-79) 175/72 (03/23 0955) ?SpO2:  [98 %-100 %] 99 % (03/23 0955)     Height: '5\' 2"'$  (157.5 cm) Weight: 41.6 kg BMI (Calculated): 16.77  ? ?Intake/Output this shift:  ? ?Intake/Output Summary (Last 24 hours) at 09/21/2021 1242 ?Last data filed at 09/21/2021 1020 ?Gross per 24 hour  ?Intake 1007.56 ml  ?Output 950 ml  ?Net 57.56 ml  ? ? ?Constitutional :  alert, cooperative, appears stated age, and no distress  ?Respiratory:  clear to auscultation bilaterally  ?Cardiovascular:  regular rate and rhythm  ?Gastrointestinal: Soft, some TTP around incision, no guarding JP with less sanguinous  drainage today, NG with bilious output .   ?Skin: Cool and moist. Staples c/d/i  ?Psychiatric: Normal affect, non-agitated, not confused  ?   ?  ?LABS:  ? ?  Latest Ref Rng & Units 09/21/2021  ?  4:12 AM 09/20/2021  ?  8:36 AM 09/19/2021  ?  4:39 AM  ?CMP  ?Glucose 70 - 99 mg/dL 111   154   168    ?BUN 6 - 20 mg/dL 25   30   41    ?Creatinine 0.44 - 1.00 mg/dL 0.76   0.85   1.22    ?Sodium 135 - 145 mmol/L 149   147    150    ?Potassium 3.5 - 5.1 mmol/L 4.1   4.3   4.4    ?Chloride 98 - 111 mmol/L 122   119   122    ?CO2 22 - 32 mmol/L '22   22   23    '$ ?Calcium 8.9 - 10.3 mg/dL 7.9   8.6   8.4    ?Total Protein 6.5 - 8.1 g/dL 5.1   5.6   5.6    ?Total Bilirubin 0.3 - 1.2 mg/dL 0.9   1.0   0.8    ?Alkaline Phos 38 - 126 U/L 129   139   137    ?AST 15 - 41 U/L '25   27   22    '$ ?ALT 0 - 44 U/L '15   15   15    '$ ? ? ?  Latest Ref Rng & Units 09/21/2021  ?  4:12 AM 09/20/2021  ?  8:36 AM 09/20/2021  ?  5:45 AM  ?CBC  ?WBC 4.0 - 10.5 K/uL 12.7   12.1   11.2    ?Hemoglobin 12.0 - 15.0 g/dL 8.4   8.6   8.3    ?Hematocrit 36.0 - 46.0 % 26.2   27.0   25.3    ?Platelets 150 - 400 K/uL 125   130   125    ? ? ?  RADS: ? ?Assessment:  ? ?S/p exploratory laparotomy, redo peptic ulcer perforation repair, G and J tube placement, due to persistent leak after intial graham patch repair ? ?Hgb stabilized. 3u transfused total so far.  INR now <1.5 after 2u FFP.  JP remains with small sanguinous discharge recorded.  Continue to monitor for any additional concerns for bleeding. ? ?Due to persistent leak, will continue with bowel rest.  TPN for at least 7days post op. Continue IV ABX and antifungal for intra-abdominal infection coverage.  ? ?Swish and spit ok but strict NPO otherwise. ? ?labs/images/medications/previous chart entries reviewed personally and relevant changes/updates noted above. ? ? ?

## 2021-09-21 NOTE — Assessment & Plan Note (Signed)
With pneumoperitoneum and intra-abdominal abscesses s/p exploratory laparotomy twice as first time she was continued to have leak.  First surgery was on 09/13/2021 and second was on 09/17/2021. ?-Patient will remain n.p.o. for bowel rest for another week and started on TPN. ?-NG tube in place, it was replaced after accidental fall overnight. ?-Continue TPN ?-Continue with Zosyn and Diflucan-being managed by surgery. ?-Continue with pain management and supportive care ?

## 2021-09-21 NOTE — Assessment & Plan Note (Signed)
Patient received 2 unit of PRBC when her hemoglobin dropped to 6.4 after second surgery.  She also received 1 unit of FFP due to INR of 1.7, followed by 2 more units of FFP by surgery. ?Hemoglobin currently stable around 8.4. ?Anemia panel consistent with anemia of chronic disease.  No deficiencies. ?-Monitor hemoglobin ?-Transfuse if below 7 ? ?

## 2021-09-21 NOTE — Assessment & Plan Note (Signed)
No withdrawal symptoms. ?-Continue with IV folic acid and thiamine.  We will switch to p.o. once able to take  Oral. ?-Continue with CIWA protocol ?

## 2021-09-21 NOTE — Assessment & Plan Note (Signed)
Slight worsening of sodium after some improvement a day before, currently at 149. she is on TPN. ?-Restart D5 at 100 mL/h for 20 hours. ?-Monitor sodium ?

## 2021-09-21 NOTE — Evaluation (Signed)
Occupational Therapy Evaluation ?Patient Details ?Name: Maria [name] ?MRN: [id number] ?DOB: [birth year] ?Today's Date: 09/21/2021 ? ? ?History of Present Illness Pt is a 51 year old female admitted on on 09/13/2021 under general surgery due to pneumoperitoneum secondary to perforated peptic ulcer, patient was taken to the OR s/p ex lap with primary closure of perforated peptic ulcer with Phillip Heal patch repair with intra-abdominal abscess drainage.  Postop patient became unresponsive so code stroke was called but work-up was negative. Patient underwent another exploratory laparotomy on 09/17/2021 when peptic ulcer perforation repair was redone along with G and J-tube placement, intra-abdominal abscess was also drained. TPN started 09/18/21.  ? ?Clinical Impression ?  ?Chart reviewed, pt greeted in bed with mother present. Pt is alert and oriented x4, agreeable to OT evaluation. Pt reports feeling generally weak from hospital stay. Pt presents with deficits in strength, endurance, balance, activity tolerance all affecting safe ADL completion. PTA pt was independent in all ADL/IADL. Pt would benefit from acute OT to address functional deficits, no OT follow up recommended after discharge.  ?   ? ?Recommendations for follow up therapy are one component of a multi-disciplinary discharge planning process, led by the attending physician.  Recommendations may be updated based on patient status, additional functional criteria and insurance authorization.  ? ?Follow Up Recommendations ? No OT follow up  ?  ?Assistance Recommended at Discharge Intermittent Supervision/Assistance  ?Patient can return home with the following A little help with walking and/or transfers;A little help with bathing/dressing/bathroom;Assistance with cooking/housework ? ?  ?Functional Status Assessment ? Patient has had a recent decline in their functional status and demonstrates the ability to make significant improvements in function in a reasonable and  predictable amount of time.  ?Equipment Recommendations ? None recommended by OT  ?  ?Recommendations for Other Services   ? ? ?  ?Precautions / Restrictions Precautions ?Precautions: Fall ?Precaution Comments: ng tube, jp drain ?Restrictions ?Weight Bearing Restrictions: No  ? ?  ? ?Mobility Bed Mobility ?Overal bed mobility: Modified Independent, Needs Assistance ?Bed Mobility: Supine to Sit ?  ?  ?Supine to sit: Modified independent (Device/Increase time), HOB elevated ?  ?  ?  ?  ? ?Transfers ?Overall transfer level: Needs assistance ?  ?Transfers: Sit to/from Stand ?Sit to Stand: Min assist ?  ?  ?  ?  ?  ?  ?  ? ?  ?Balance Overall balance assessment: Needs assistance ?Sitting-balance support: Feet supported ?Sitting balance-Leahy Scale: Normal ?  ?  ?Standing balance support: Single extremity supported, During functional activity ?Standing balance-Leahy Scale: Fair ?  ?  ?  ?  ?  ?  ?  ?  ?  ?  ?  ?  ?   ? ?ADL either performed or assessed with clinical judgement  ? ?ADL Overall ADL's : Needs assistance/impaired ?Eating/Feeding: NPO ?Eating/Feeding Details (indicate cue type and reason): TPN at this time ?Grooming: Wash/dry hands;Wash/dry face;Sitting;Set up ?  ?  ?  ?  ?  ?Upper Body Dressing : Minimal assistance ?  ?Lower Body Dressing: Minimal assistance ?  ?Toilet Transfer: Min guard;Minimal assistance ?Toilet Transfer Details (indicate cue type and reason): HHA simulated ?  ?  ?  ?  ?Functional mobility during ADLs: Min guard;Minimal assistance ?General ADL Comments: HHA due to generalized weakness and deconditioning approx 10 feet 3 attempts  ? ? ? ?Vision Patient Visual Report: No change from baseline ?   ?   ?Perception   ?  ?Praxis   ?  ? ?  Pertinent Vitals/Pain Pain Assessment ?Pain Assessment: 0-10 ?Pain Score: 4  ?Pain Location: abdomen ?Pain Descriptors / Indicators: Discomfort ?Pain Intervention(s): Limited activity within patient's tolerance, Monitored during session  ? ? ? ?Hand Dominance   ?   ?Extremity/Trunk Assessment Upper Extremity Assessment ?Upper Extremity Assessment: Generalized weakness ?  ?Lower Extremity Assessment ?Lower Extremity Assessment: Generalized weakness ?  ?Cervical / Trunk Assessment ?Cervical / Trunk Assessment: Normal ?  ?Communication Communication ?Communication: No difficulties ?  ?Cognition Arousal/Alertness: Awake/alert ?Behavior During Therapy: [facility] for tasks assessed/performed ?Overall Cognitive Status: Within Functional Limits for tasks assessed ?  ?  ?  ?  ?  ?  ?  ?  ?  ?  ?  ?  ?  ?  ?  ?  ?General Comments: alert and oriented x4 ?  ?  ?General Comments    ? ?  ?Exercises Other Exercises ?Other Exercises: edu re: role of OT, role of rehab, oob mobiltity with assistance, HEP ?  ?Shoulder Instructions    ? ? ?Home Living Family/patient expects to be discharged to:: Private residence ?Living Arrangements: Spouse/significant other ?Available Help at Discharge: Family;Available 24 hours/day ?Type of Home: House ?Home Access: Stairs to enter ?Entrance Stairs-Number of Steps: 2 ?Entrance Stairs-Rails: None (columns that can be reached) ?Home Layout: Two level;Able to live on main level with bedroom/bathroom ?Alternate Level Stairs-Number of Steps: 13 ?Alternate Level Stairs-Rails: Right ?Bathroom Shower/Tub: Walk-in shower ?  ?Bathroom Toilet: Standard ?  ?  ?Home Equipment: Grab bars - tub/shower;Grab bars - toilet ?  ?  ?  ? ?  ?Prior Functioning/Environment Prior Level of Function : Independent/Modified Independent ?  ?  ?  ?  ?  ?  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  ? ?  ?  ?OT Problem List: Decreased strength;Decreased activity tolerance ?  ?   ?OT Treatment/Interventions: Self-care/ADL training;Therapeutic exercise;Patient/family education;Therapeutic activities;DME and/or AE instruction;Cognitive remediation/compensation  ?  ?OT Goals(Current goals can be found in the care plan section) Acute Rehab OT Goals ?Patient Stated Goal: get  stronger ?OT Goal Formulation: With patient ?Time For Goal Achievement: 10/05/21 ?Potential to Achieve Goals: Good ?ADL Goals ?Pt Will Perform Grooming: Independently ?Pt Will Perform Upper Body Dressing: Independently ?Pt Will Perform Lower Body Dressing: Independently ?Pt Will Transfer to Toilet: Independently ?Pt/caregiver will Perform Home Exercise Program: Increased strength;Both right and left upper extremity;With written HEP provided  ?OT Frequency: Min 2X/week ?  ? ?Co-evaluation   ?  ?  ?  ?  ? ?  ?AM-PAC OT "6 Clicks" Daily Activity     ?Outcome Measure Help from another person eating meals?: Total (TPN) ?Help from another person taking care of personal grooming?: None ?Help from another person toileting, which includes using toliet, bedpan, or urinal?: A Little ?Help from another person bathing (including washing, rinsing, drying)?: A Little ?Help from another person to put on and taking off regular upper body clothing?: A Little ?Help from another person to put on and taking off regular lower body clothing?: A Little ?6 Click Score: 17 ?  ?End of Session Nurse Communication: Mobility status ? ?Activity Tolerance: Patient tolerated treatment well ?Patient left: in bed;with family/visitor present;Other (comment) (at edge of bed with mother present) ? ?OT Visit Diagnosis: Unsteadiness on feet (R26.81);Muscle weakness (generalized) (M62.81)  ?              ?Time: 9163-8466 ?OT Time Calculation (min): 32 min ?Charges:  OT General Charges ?$OT Visit: 1 Visit ?  OT Evaluation ?$OT Eval Low Complexity: 1 Low ?OT Treatments ?$Self Care/Home Management : 8-22 mins ? ?Shanon Payor, OTD OTR/L  ?09/21/21, 4:05 PM  ?

## 2021-09-21 NOTE — Progress Notes (Addendum)
?[facility]  ?PROGRESS NOTE  ? ?Subjective:  ? ?Patient resting quietly ?Family at bedside ?Complains of generalized itching ?Remains NPO  ? ?Creatinine normal ?Urine output 79m on night shift ?Sodium 149 ? ?Objective:  ?Vital signs: ?Blood pressure (!) 175/72, pulse 66, temperature 98.5 ?F (36.9 ?C), temperature source Oral, resp. rate 16, height '5\' 2"'$  (1.575 m), weight 41.6 kg, last menstrual period 02/24/2018, SpO2 99 %. ? ?Intake/Output Summary (Last 24 hours) at 09/21/2021 1223 ?Last data filed at 09/21/2021 1020 ?Gross per 24 hour  ?Intake 1007.56 ml  ?Output 950 ml  ?Net 57.56 ml  ? ? ?Filed Weights  ? 09/17/21 0447 09/18/21 0500 09/19/21 0500  ?Weight: 60 kg 46 kg 41.6 kg  ? ? ? ?Physical Exam: ?General:  No acute distress  ?Head:  Normocephalic, atraumatic. Moist oral mucosal membranes, NGT  ?Eyes:  Anicteric  ?Lungs:   Clear to auscultation, normal effort  ?Heart:  S1S2 no rubs  ?Abdomen:  S/p surgery, JP drain  ?Extremities:  No peripheral edema.  ?Neurologic:  Awake, alert, following commands  ?Skin:  No lesions  ?   ? ? ?Basic Metabolic Panel: ?Recent Labs  ?Lab 09/17/21 ?0507 09/18/21 ?0696703/21/23 ?0893803/22/23 ?0101703/23/23 ?05102 ?NA 141 143 150* 147* 149*  ?K 3.8 4.5 4.4 4.3 4.1  ?CL 114* 115* 122* 119* 122*  ?CO2 22 21* '23 22 22  '$ ?GLUCOSE 128* 207* 168* 154* 111*  ?BUN 71* 51* 41* 30* 25*  ?CREATININE 2.20* 1.49* 1.22* 0.85 0.76  ?CALCIUM 8.5* 7.8* 8.4* 8.6* 7.9*  ?MG 2.1 1.6* 2.4 1.6* 1.6*  ?PHOS 4.4 4.8* 2.7 2.1* 2.4*  ? ? ? ?CBC: ?Recent Labs  ?Lab 09/18/21 ?0585203/21/23 ?0439 09/19/21 ?2156 09/20/21 ?0778203/22/23 ?0423503/23/23 ?03614 ?WBC 14.1* 14.5*  --  11.2* 12.1* 12.7*  ?HGB 6.4* 8.0* 7.3* 8.3* 8.6* 8.4*  ?HCT 20.0* 24.5* 22.1* 25.3* 27.0* 26.2*  ?MCV 112.4* 101.7*  --  105.4* 100.7* 101.6*  ?PLT 124* 138*  --  125* 130* 125*  ? ? ? ? ?Urinalysis: ?No results for input(s): COLORURINE, LABSPEC, [place] GLUCOSEU, HGBUR, BILIRUBINUR, KETONESUR, PROTEINUR, UROBILINOGEN,  NITRITE, LEUKOCYTESUR in the last 72 hours. ? ?Invalid input(s): APPERANCEUR  ? ? ?Imaging: ?DG Abd 1 View ? ?Addendum Date: 09/19/2021   ?ADDENDUM REPORT: 09/19/2021 20:17 ADDENDUM: Discussed case over the phone in detail with nurse practitioner Foust at 8:17 p.m., 09/19/2021, with acknowledgement of findings. Electronically Signed   By: KTelford NabM.D.   On: 09/19/2021 20:17  ? ?Result Date: 09/19/2021 ?CLINICAL DATA:  Perforated gastric ulcer patch repair march 19. Drain left in place. Check NGT positioning. EXAM: ABDOMEN - 1 VIEW COMPARISON:  Similar study 09/17/2021. FINDINGS: NGT tip is in the distal gastric body or proximal antrum. Percutaneous gastrostomy tube is again noted lateral to this, left mid abdomen and appears unchanged. Surgical drain superimposes in the central upper abdomen and there are overlying skin staples. The bowel gas pattern is not well evaluated as the lower abdomen and pelvis were excluded from view. In the upper abdomen no dilated bowel is seen. There are increased air pockets underneath the left hemidiaphragm, unknown if this is redistributed free air from the laparotomy or new free air from a perforated hollow viscus. No free air is seen beneath the right hemidiaphragm. The splenic flexure of the colon does not extend this high on the most recent CT abdomen March 19. Also the stomach does not extend this far laterally. Clinical correlation is  recommended. CT can be repeated if needed for further study. Small pleural effusions are incidentally noted. The heart is slightly enlarged. There is left lower lobe retrocardiac streaky atelectasis or consolidation. IMPRESSION: 1. There are new air pockets underneath the left hemidiaphragm which could be redistributed residual free air from the laparotomy or new free air from intestinal perforation or postsurgical leak of the stomach. Clinical correlation advised. CT can be repeated if needed. 2. NGT tip is in the distal body of stomach or  proximal gastric antrum. Percutaneous gastrostomy and drainage tube again noted. 3. Small pleural effusions, with streaky left lower lobe retrocardiac atelectasis or consolidation. Electronically Signed: By: Telford Nab M.D. On: 09/19/2021 20:12  ? ?CT Angio Abd/Pel w/ and/or w/o ? ?Result Date: 09/20/2021 ?CLINICAL DATA:  Postoperative intra-bleed, unable to localized during repair of peptic ulcer perforation. GI bleed protocol requested. EXAM: CTA ABDOMEN AND PELVIS WITHOUT AND WITH CONTRAST TECHNIQUE: Multidetector CT imaging of the abdomen and pelvis was performed using the standard protocol during bolus administration of intravenous contrast. Multiplanar reconstructed images and MIPs were obtained and reviewed to evaluate the vascular anatomy. RADIATION DOSE REDUCTION: This exam was performed according to the departmental dose-optimization program which includes automated exposure control, adjustment of the mA and/or kV according to patient size and/or use of iterative reconstruction technique. CONTRAST:  65m OMNIPAQUE IOHEXOL 350 MG/ML SOLN COMPARISON:  The CT abdomen and pelvis with contrast 09/14/2021, and CT abdomen with oral contrast only 09/17/2021 . FINDINGS: VASCULAR Aorta: Normal caliber aorta without aneurysm, dissection, vasculitis or significant stenosis. There is mild atherosclerosis. Celiac: Patent without evidence of aneurysm, dissection, vasculitis or significant stenosis. SMA: Patent without evidence of aneurysm, dissection, vasculitis or significant stenosis. Renals: Both renal arteries are patent without evidence of aneurysm, dissection, vasculitis, fibromuscular dysplasia or significant stenosis. There are bilateral single renal arteries. Left renal artery demonstrates nonstenosing ostial calcification anteriorly with no other calcifications. IMA: Patent without evidence of aneurysm, dissection, vasculitis or significant stenosis. Inflow: Patent without evidence of aneurysm, dissection,  vasculitis or significant stenosis. There is mild atherosclerosis of the distal left common iliac artery mild-to-moderate atherosclerosis in both internal iliac arteries. No significant external iliac artery findings. Proximal Outflow: Bilateral common femoral and visualized portions of the superficial and profunda femoral arteries are patent without evidence of aneurysm, dissection, vasculitis or significant stenosis. There are nonstenosing calcifications in the posterior wall in both common femoral arteries. Veins: The venous systems are patent. Review of the MIP images confirms the above findings. NON-VASCULAR Lower chest: There are small bilaterally increased left-greater-than-right layering pleural effusions, with again noted adjacent atelectasis or consolidation in the lower lobes. The heart is slightly enlarged. The intraventricular blood pool is low in density consistent with anemia on the noncontrast exam. Hepatobiliary: The liver enhances uniformly but does have somewhat of borderline cirrhotic appearance. Gallbladder is distended with dense material which could be residual vicariously excreted contrast or dense sludge. There is no wall thickening or biliary dilatation. Pancreas: Diffusely atrophic calcifications consistent chronic pancreatitis in the head and neck, chronic mild prominence of the pancreatic duct. No adjacent inflammation or mass. Spleen: No mass enhancement or splenomegaly. Adrenals/Urinary Tract: There is no adrenal mass , no abnormality in the renal cortex. No evidence for urinary stone or obstruction. Unremarkable bladder. Stomach/Bowel: There was previously an anterior gastric antral perforation which is not seen today any longer, with newly noted surgical drain entering from the left extending underneath and behind the liver with its tip in the lesser sac  region. If there is a new gastrointestinal tract perforation it is not readily apparent. Thickened folds continues to be seen in  the stomach as well as a PEG tube entering from the left in the body of the stomach. NGT is no longer within the stomach. No small bowel dilatation or inflammation is seen. There is a normal caliber appendi

## 2021-09-21 NOTE — Assessment & Plan Note (Signed)
Blood pressure remained elevated .  Echocardiogram normal. ?-Make hydralazine as scheduled, 10 mg IV every 8 hourly. ?-Monitor blood pressure ?

## 2021-09-21 NOTE — Progress Notes (Signed)
?Progress Note ? ? ?Patient: Maria [name] VOJ:[id number] DOB: 07/28/1970 DOA: 09/13/2021     7 ?DOS: the patient was seen and examined on 09/21/2021 ?  ?Brief hospital course: ?Taken from prior notes. ? ?Maria [name] is a 51 y.o. female with PMH of HTN, pancreatitis, GERD/gastritis, anxiety, depression, alcohol abuse presented at [facility] ED with abdominal pain.  Patient was admitted on 09/13/2021 under general surgery due to pneumoperitoneum secondary to perforated peptic ulcer, patient was taken to the OR s/p ex lap with primary closure of perforated peptic ulcer with Phillip Heal patch repair with intra-abdominal abscess drainage.  Postop patient became unresponsive so code stroke was called but work-up was negative.  Patient was in the ICU, downgraded on the medical floor on 09/16/2021. ? ?Patient underwent another exploratory laparotomy yesterday on 09/17/2021 when peptic ulcer perforation repair was redone along with G and J-tube placement, intra-abdominal abscess was also drained. ? ?NG tube in place. ?Patient was started on TPN today, 09/18/2021 as surgery would like to give her a bowel rest for at least for 1 week. ? ?Foley catheter in place-surgery wants to keep for another 1 to 2 days. ?PICC line was placed on 09/18/2021 to start TPN ? ?3/21: Patient overall stable, labs with hypernatremia with sodium of 150.  She was not on any IV fluid except TPN, giving some D5. ?Surgery wants to give bowel rest for 1 week. ?Foley can be removed today. ? ?3/22: Overnight NG tube was accidentally fallen off and was replaced by nursing staff.  KUB with some air under the diaphragm, per surgery that is normal but they are recommending not to replace NG tube without notifying them first as she is prone to more perforation. ? ?3/23: No acute concern.  Patient remained on TPN.  Hopefully GI series earlier next week and if there is no leak then slowly start enteric feeding. ?IV hydralazine was made as schedule due to elevated blood  pressure. ? ? ?Assessment and Plan: ?* Perforated peptic ulcer ([place]) ?With pneumoperitoneum and intra-abdominal abscesses s/p exploratory laparotomy twice as first time she was continued to have leak.  First surgery was on 09/13/2021 and second was on 09/17/2021. ?-Patient will remain n.p.o. for bowel rest for another week and started on TPN. ?-NG tube in place, it was replaced after accidental fall overnight. ?-Continue TPN ?-Continue with Zosyn and Diflucan-being managed by surgery. ?-Continue with pain management and supportive care ? ?Malnutrition of moderate degree ?Estimated body mass index is 16.77 kg/m? as calculated from the following: ?  Height as of this encounter: '5\' 2"'$  (1.575 m). ?  Weight as of this encounter: 41.6 kg.  ? ?-Patient is being started on TPN ? ?Essential hypertension ?Blood pressure remained elevated .  Echocardiogram normal. ?-Make hydralazine as scheduled, 10 mg IV every 8 hourly. ?-Monitor blood pressure ? ?AKI (acute kidney injury) ([place]) ?Resolved.  Creatinine normalized ?-Monitor renal function ?-Avoid nephrotoxins ? ?Vitamin D deficiency ?Vitamin D level at 11.5. ?-We will start vitamin D 50,000 units p.o. weekly once able to take p.o. ? ?Alcohol abuse ?No withdrawal symptoms. ?-Continue with IV folic acid and thiamine.  We will switch to p.o. once able to take  Oral. ?-Continue with CIWA protocol ? ?Acute postoperative anemia due to expected blood loss ?Patient received 2 unit of PRBC when her hemoglobin dropped to 6.4 after second surgery.  She also received 1 unit of FFP due to INR of 1.7, followed by 2 more units of FFP by surgery. ?Hemoglobin currently stable around  8.4. ?Anemia panel consistent with anemia of chronic disease.  No deficiencies. ?-Monitor hemoglobin ?-Transfuse if below 7 ? ? ?Hypernatremia ?Slight worsening of sodium after some improvement a day before, currently at 149. she is on TPN. ?-Restart D5 at 100 mL/h for 20 hours. ?-Monitor sodium ? ?  ? ?Subjective:  Patient was seen and examined today.  Having some mild abdominal discomfort, stating that it is bearable.  Mother at bedside. ? ?Physical Exam: ?Vitals:  ? 09/20/21 1530 09/20/21 2048 09/21/21 0404 09/21/21 0955  ?BP: (!) 148/74 (!) 163/72 (!) 161/79 (!) 175/72  ?Pulse: 72 70 69 66  ?Resp: '16 16 18 16  '$ ?Temp: 98 ?F (36.7 ?C) 98.7 ?F (37.1 ?C) 97.8 ?F (36.6 ?C) 98.5 ?F (36.9 ?C)  ?TempSrc: Oral  Oral Oral  ?SpO2: 98% 100% 100% 99%  ?Weight:      ?Height:      ? ?General.  Well-developed lady, in no acute distress.  NG tube in place ?Pulmonary.  Lungs clear bilaterally, normal respiratory effort. ?CV.  Regular rate and rhythm, no JVD, rub or murmur. ?Abdomen.  Soft, nontender, nondistended, BS positive.  Drain in place ?CNS.  Alert and oriented .  No focal neurologic deficit. ?Extremities.  No edema, no cyanosis, pulses intact and symmetrical. ?Psychiatry.  Judgment and insight appears normal. ? ?Data Reviewed: ?Prior notes and labs reviewed ? ?Family Communication: Discussed with mother at bedside. ? ?Disposition: ?Status is: Inpatient ?Remains inpatient appropriate because: Severity of illness ? ? Planned Discharge Destination: Home ? ? ?Time spent: 45 minutes ? ?This record has been created using Systems analyst. Errors have been sought and corrected,but may not always be located. Such creation errors do not reflect on the standard of care. ? ?Author: ?Lorella Nimrod, MD ?09/21/2021 2:34 PM ? ?For on call review [email or web address].  ?

## 2021-09-21 NOTE — Progress Notes (Signed)
Both CIWA of 0 through the night no s/s of withdrawal noted this shift. ?Maria [name] ?09/21/21 ?5:42 AM ? ?

## 2021-09-21 NOTE — Assessment & Plan Note (Signed)
Resolved.  Creatinine normalized ?-Monitor renal function ?-Avoid nephrotoxins ?

## 2021-09-21 NOTE — Consult Note (Signed)
PHARMACY - TOTAL PARENTERAL NUTRITION CONSULT NOTE  ? ?Indication: Prolonged ileus ? ?Patient Measurements: ?Height: '5\' 2"'$  (157.5 cm) ?Weight: 41.6 kg (91 lb 11.4 oz) ?IBW/kg (Calculated) : 50.1 ?TPN AdjBW (KG): 53.5 ?Body mass index is 16.77 kg/m?. ?Usual Weight: 55kg ? ?Assessment: [name] s/p [place] of non-healing perforated peptic ulcer c/b prolonged ileus. GJ tube placed 3/19 and Pharmacy consulted for mgmt of TPN and electrolytes. ? ?Glucose / Insulin: BG previous 24h 111 - 151  ?--4 units SSI required ?Electrolytes: hypernatremia, hypomagnesemia, hypophosphatemia ?Renal: Scr 2.63-->0.76 ?Hepatic: LFTs wnl ?Intake / Output; MIVF: no MIVF ?GI Imaging: ?3/19 CT abd -- Anterior antral gastric wall perforation with associated moderate volume pneumoperitoneum, small volume ascites, extravasated PO contrast. Colonic diverticulosis & Chronic pancreatitis. ? ?3/16 CT abd/pelvis:  Decreased pneumoperitoneum, Phillip Heal patch repair of the perforated ?distal stomach, postoperative drain at the greater omentum. Small volume mostly simple density fluid in the abdomen is primarily around the liver. No bowel obstruction. Chronic calcific pancreatitis ? ?GI Surgeries / Procedures: 03/19 exploratory laparotomy ? ?Central access: 09/18/21 ?TPN start date: 09/18/21 ? ?Nutritional Goals: ?Goal TPN rate is 65 mL/hr (provides 90.1 g of protein and 1676.7 kcals per day) ? ?RD Assessment: ?Estimated Needs ?Total Energy Estimated Needs: 1600-1800kcal/day ?Total Protein Estimated Needs: 80-90g/day ?Total Fluid Estimated Needs: 1.6-1.8L/day ? ?Current Nutrition:  ?NPO ? ?Plan:  ?continue TPN at goal rate of 65 mL/hr at 1800 (total volume including overfill 1660 mL) ?Nutritional Components ?Amino acids (using 10% Travasol): 90.1 grams ?Dextrose: 249.6 grams ?Lipids (using 20% SMOFlipids): 46.8 grams ?Electrolytes in TPN: Na 87mq/L, K 420m/L, Ca 27m427mL, Mg 79m38m, and Phos 20mm90m. Cl:Ac 1:2 ?Add standard MVI and trace  elements to TPN ?2 grams IV magnesium sulfate x 1 ?Continue Sensitive SSI to q6h and adjust as needed  ?Monitor TPN labs on Mon/Thurs when stable, daily until then ? ?RodneDallie Piles3/2023,7:07 AM ? ?

## 2021-09-22 DIAGNOSIS — K275 Chronic or unspecified peptic ulcer, site unspecified, with perforation: Secondary | ICD-10-CM | POA: Diagnosis not present

## 2021-09-22 LAB — COMPREHENSIVE METABOLIC PANEL
ALT: 20 U/L (ref 0–44)
AST: 29 U/L (ref 15–41)
Albumin: 1.9 g/dL — ABNORMAL LOW (ref 3.5–5.0)
Alkaline Phosphatase: 178 U/L — ABNORMAL HIGH (ref 38–126)
Anion gap: 5 (ref 5–15)
BUN: 24 mg/dL — ABNORMAL HIGH (ref 6–20)
CO2: 24 mmol/L (ref 22–32)
Calcium: 8.4 mg/dL — ABNORMAL LOW (ref 8.9–10.3)
Chloride: 110 mmol/L (ref 98–111)
Creatinine, Ser: 0.87 mg/dL (ref 0.44–1.00)
GFR, Estimated: >60 mL/min (ref >60)
Glucose, Bld: 145 mg/dL — ABNORMAL HIGH (ref 70–99)
Potassium: 4.4 mmol/L (ref 3.5–5.1)
Sodium: 139 mmol/L (ref 135–145)
Total Bilirubin: 1.2 mg/dL (ref 0.3–1.2)
Total Protein: 5.8 g/dL — ABNORMAL LOW (ref 6.5–8.1)

## 2021-09-22 LAB — CBC
HCT: 24.6 % — ABNORMAL LOW (ref 36.0–46.0)
Hemoglobin: 7.9 g/dL — ABNORMAL LOW (ref 12.0–15.0)
MCH: 32.8 pg (ref 26.0–34.0)
MCHC: 32.1 g/dL (ref 30.0–36.0)
MCV: 102.1 fL — ABNORMAL HIGH (ref 80.0–100.0)
Platelets: 120 10*3/uL — ABNORMAL LOW (ref 150–400)
RBC: 2.41 MIL/uL — ABNORMAL LOW (ref 3.87–5.11)
RDW: 18.2 % — ABNORMAL HIGH (ref 11.5–15.5)
WBC: 13.8 10*3/uL — ABNORMAL HIGH (ref 4.0–10.5)
nRBC: 0 % (ref 0.0–0.2)

## 2021-09-22 LAB — PROTIME-INR
INR: 1.2 (ref 0.8–1.2)
Prothrombin Time: 15.3 seconds — ABNORMAL HIGH (ref 11.4–15.2)

## 2021-09-22 LAB — GLUCOSE, CAPILLARY
Glucose-Capillary: 167 mg/dL — ABNORMAL HIGH (ref 70–99)
Glucose-Capillary: 155 mg/dL — ABNORMAL HIGH (ref 70–99)

## 2021-09-22 LAB — MAGNESIUM: Magnesium: 1.7 mg/dL (ref 1.7–2.4)

## 2021-09-22 MED ORDER — TRAVASOL 10 % IV SOLN
INTRAVENOUS | Status: AC
  Filled 2021-09-22: qty 900.1

## 2021-09-22 MED ORDER — MAGNESIUM SULFATE 2 GM/50ML IV SOLN
2.0000 g | Freq: Once | INTRAVENOUS | Status: AC
  Administered 2021-09-22: 2 g via INTRAVENOUS
  Filled 2021-09-22: qty 50

## 2021-09-22 MED ORDER — ACETAMINOPHEN 10 MG/ML IV SOLN
1000.0000 mg | Freq: Once | INTRAVENOUS | Status: AC
  Administered 2021-09-23: 1000 mg via INTRAVENOUS
  Filled 2021-09-22: qty 100

## 2021-09-22 NOTE — Progress Notes (Signed)
Occupational Therapy Treatment ?Patient Details ?Name: Maria [name] ?MRN: [id number] ?DOB: Nov 08, 1970 ?Today's Date: 09/22/2021 ? ? ?History of present illness Pt is a 51 year old female admitted on on 09/13/2021 under general surgery due to pneumoperitoneum secondary to perforated peptic ulcer, patient was taken to the OR s/p ex lap with primary closure of perforated peptic ulcer with Phillip Heal patch repair with intra-abdominal abscess drainage.  Postop patient became unresponsive so code stroke was called but work-up was negative. Patient underwent another exploratory laparotomy on 09/17/2021 when peptic ulcer perforation repair was redone along with G and J-tube placement, intra-abdominal abscess was also drained. TPN started 09/18/21. ?  ?OT comments ? Chart reviewed, RN present to disconnect NG from suction. Tx session targeted introducing BUE gentle AROM HEP, improving activity tolerance through ADL and functional mobility. Pt with good understanding and teach back of HEP. Improved tolerance for functional mobility and toileting tasks. Pt is left as received, NAD, all needs met. OT will continue to follow.    ? ?Recommendations for follow up therapy are one component of a multi-disciplinary discharge planning process, led by the attending physician.  Recommendations may be updated based on patient status, additional functional criteria and insurance authorization. ?   ?Follow Up Recommendations ? No OT follow up  ?  ?Assistance Recommended at Discharge Intermittent Supervision/Assistance  ?Patient can return home with the following ? A little help with walking and/or transfers;A little help with bathing/dressing/bathroom;Assistance with cooking/housework ?  ?Equipment Recommendations ? None recommended by OT  ?  ?Recommendations for Other Services   ? ?  ?Precautions / Restrictions Precautions ?Precautions: Fall ?Precaution Comments: ng tube, jp drain ?Restrictions ?Weight Bearing Restrictions: No  ? ? ?  ? ?Mobility  Bed Mobility ?Overal bed mobility: Modified Independent ?  ?  ?  ?Supine to sit: HOB elevated ?  ?  ?  ?  ? ?Transfers ?Overall transfer level: Needs assistance ?  ?Transfers: Sit to/from Stand ?Sit to Stand: Supervision ?  ?  ?  ?  ?  ?  ?  ?  ?Balance Overall balance assessment: Needs assistance ?Sitting-balance support: Feet supported ?Sitting balance-Leahy Scale: Normal ?  ?  ?Standing balance support: Single extremity supported, During functional activity ?Standing balance-Leahy Scale: Fair ?  ?  ?  ?  ?  ?  ?  ?  ?  ?  ?  ?  ?   ? ?ADL either performed or assessed with clinical judgement  ? ?ADL Overall ADL's : Needs assistance/impaired ?Eating/Feeding: NPO ?  ?Grooming: Wash/dry hands;Wash/dry face;Set up;Standing ?  ?  ?  ?  ?  ?  ?  ?  ?  ?Toilet Transfer: Supervision/safety ?  ?Toileting- Clothing Manipulation and Hygiene: Supervision/safety ?  ?  ?  ?Functional mobility during ADLs: Supervision/safety;Min guard ?  ?  ? ?Extremity/Trunk Assessment Upper Extremity Assessment ?Upper Extremity Assessment: Generalized weakness ?  ?Lower Extremity Assessment ?Lower Extremity Assessment: Generalized weakness ?  ?Cervical / Trunk Assessment ?Cervical / Trunk Assessment: Normal ?  ? ?Vision   ?  ?  ?Perception   ?  ?Praxis   ?  ? ?Cognition Arousal/Alertness: Awake/alert ?Behavior During Therapy: [facility] for tasks assessed/performed ?Overall Cognitive Status: Within Functional Limits for tasks assessed ?  ?  ?  ?  ?  ?  ?  ?  ?  ?  ?  ?  ?  ?  ?  ?  ?  ?  ?  ?   ?Exercises Other Exercises ?  Other Exercises: edu re: BUE HEP (gentle AROM for strengthening) ? ?  ?Shoulder Instructions   ? ? ?  ?General Comments    ? ? ?Pertinent Vitals/ Pain       Pain Assessment ?Pain Assessment: No/denies pain ? ?Home Living Family/patient expects to be discharged to:: Private residence ?Living Arrangements: Spouse/significant other ?Available Help at Discharge: Family;Available 24 hours/day ?Type of Home: House ?Home Access: Stairs to  enter ?Entrance Stairs-Number of Steps: 2 ?Entrance Stairs-Rails: None (columns that can be reached) ?Home Layout: Two level;Able to live on main level with bedroom/bathroom ?Alternate Level Stairs-Number of Steps: 13 ?Alternate Level Stairs-Rails: Right ?Bathroom Shower/Tub: Walk-in shower ?  ?Bathroom Toilet: Standard ?  ?  ?Home Equipment: Grab bars - tub/shower;Grab bars - toilet ?  ?  ?  ? ?  ?Prior Functioning/Environment    ?  ?  ?  ?   ? ?Frequency ? Min 2X/week  ? ? ? ? ?  ?Progress Toward Goals ? ?OT Goals(current goals can now be found in the care plan section) ? Progress towards OT goals: Progressing toward goals ? ?Acute Rehab OT Goals ?Patient Stated Goal: get stronger ?OT Goal Formulation: With patient ?Time For Goal Achievement: 10/06/21 ?Potential to Achieve Goals: Good  ?Plan Discharge plan remains appropriate   ? ?Co-evaluation ? ? ?   ?  ?  ?  ?  ? ?  ?AM-PAC OT "6 Clicks" Daily Activity     ?Outcome Measure ? ? Help from another person eating meals?: Total ?Help from another person taking care of personal grooming?: None ?Help from another person toileting, which includes using toliet, bedpan, or urinal?: None ?Help from another person bathing (including washing, rinsing, drying)?: A Little ?Help from another person to put on and taking off regular upper body clothing?: None ?Help from another person to put on and taking off regular lower body clothing?: A Little ?6 Click Score: 19 ? ?  ?End of Session   ? ?OT Visit Diagnosis: Unsteadiness on feet (R26.81);Muscle weakness (generalized) (M62.81) ?  ?Activity Tolerance Patient tolerated treatment well ?  ?Patient Left in bed;with call bell/phone within reach ?  ?Nurse Communication Mobility status ?  ? ?   ? ?Time: 0300-9233 ?OT Time Calculation (min): 20 min ? ?Charges: OT General Charges ?$OT Visit: 1 Visit ?OT Treatments ?$Therapeutic Activity: 8-22 mins ?Shanon Payor, OTD OTR/L  ?09/22/21, 3:46 PM  ?

## 2021-09-22 NOTE — Progress Notes (Signed)
A son's concern ? ?Patient's son wished to talk to me about his  mother. Apparently she believes that she was in a car wreck, and that she is Photographer for all the things she has done." The son was concerned and asked if I would notify MD.  ?

## 2021-09-22 NOTE — Assessment & Plan Note (Signed)
Patient received 2 unit of PRBC when her hemoglobin dropped to 6.4 after second surgery.  She also received 1 unit of FFP due to INR of 1.7, followed by 2 more units of FFP by surgery. ?Hemoglobin with slow decline, at 7.9 today. ?Anemia panel consistent with anemia of chronic disease.  No deficiencies. ?-Monitor hemoglobin ?-Transfuse if below 7 ? ?

## 2021-09-22 NOTE — Plan of Care (Addendum)
?  Problem: Clinical Measurements: ?Goal: Ability to maintain clinical measurements within normal limits will improve ?Outcome: Progressing ?Goal: Will remain free from infection ?Outcome: Progressing ?Goal: Diagnostic test results will improve ?Outcome: Progressing ?Goal: Respiratory complications will improve ?Outcome: Progressing ?Goal: Cardiovascular complication will be avoided ?Outcome: Progressing ?  ?Problem: Pain Managment: ?Goal: General experience of comfort will improve ?Outcome: Progressing ?  ?Pt is involved in and agrees with the plan of care. V/S stable. Denies any pain or SOB. NGT in place; drained 50 ml of greenish output. JP drain in place;has 113m of serosanguinous drained. Pt states she pass gas last night but no BM yet. ?

## 2021-09-22 NOTE — Evaluation (Signed)
Physical Therapy Evaluation ?Patient Details ?Name: Maria [name] ?MRN: [id number] ?DOB: 1971-01-13 ?Today's Date: 09/22/2021 ? ?History of Present Illness ? Pt is a 51 year old female admitted on on 09/13/2021 under general surgery due to pneumoperitoneum secondary to perforated peptic ulcer, patient was taken to the OR s/p ex lap with primary closure of perforated peptic ulcer with Phillip Heal patch repair with intra-abdominal abscess drainage.  Postop patient became unresponsive so code stroke was called but work-up was negative. Patient underwent another exploratory laparotomy on 09/17/2021 when peptic ulcer perforation repair was redone along with G and J-tube placement, intra-abdominal abscess was also drained. TPN started 09/18/21. ? ?  ?Clinical Impression ? Pt received in Semi-Fowler's position and agreeable to therapy.  Pt requesting to go outside if possible, so once NG tube was unhooked by nurse, pt and therapist transitioned to outside in the garden to perform the rest of therapeutic exercises.  Pt performed seated marches, ankle pumps, and LAQ while being transitioned to outside.  Pt was then able to transfer to standing and ambulated 200 ft around the healing garden and back to wheelchair.  Pt transferred back to bed with all needs met and call bell within reach.  Current discharge plans to home with HHPT are appropriate at this time.  Pt will continue to benefit from skilled therapy in order to address deficits listed below. ?   ?   ? ?Recommendations for follow up therapy are one component of a multi-disciplinary discharge planning process, led by the attending physician.  Recommendations may be updated based on patient status, additional functional criteria and insurance authorization. ? ?Follow Up Recommendations Home health PT ? ?  ?Assistance Recommended at Discharge PRN  ?Patient can return home with the following ?   ? ?  ?Equipment Recommendations Rolling walker (2 wheels)  ?Recommendations for Other  Services ?    ?  ?Functional Status Assessment Patient has had a recent decline in their functional status and demonstrates the ability to make significant improvements in function in a reasonable and predictable amount of time.  ? ?  ?Precautions / Restrictions Precautions ?Precautions: Fall ?Precaution Comments: ng tube, jp drain ?Restrictions ?Weight Bearing Restrictions: No  ? ?  ? ?Mobility ? Bed Mobility ?Overal bed mobility: Modified Independent, Needs Assistance ?Bed Mobility: Supine to Sit ?  ?  ?Supine to sit: Modified independent (Device/Increase time), HOB elevated ?  ?  ?  ?  ? ?Transfers ?Overall transfer level: Needs assistance ?  ?Transfers: Sit to/from Stand ?Sit to Stand: Min assist ?  ?  ?  ?  ?  ?  ?  ? ?Ambulation/Gait ?  ?  ?  ?  ?  ?  ?  ?  ? ?Stairs ?  ?  ?  ?  ?  ? ?Wheelchair Mobility ?  ? ?Modified Rankin (Stroke Patients Only) ?  ? ?  ? ?Balance Overall balance assessment: Needs assistance ?Sitting-balance support: Feet supported ?Sitting balance-Leahy Scale: Normal ?  ?  ?Standing balance support: Single extremity supported, During functional activity ?Standing balance-Leahy Scale: Fair ?  ?  ?  ?  ?  ?  ?  ?  ?  ?  ?  ?  ?   ? ? ? ?Pertinent Vitals/Pain Pain Assessment ?Pain Assessment: No/denies pain  ? ? ?Home Living Family/patient expects to be discharged to:: Private residence ?Living Arrangements: Spouse/significant other ?Available Help at Discharge: Family;Available 24 hours/day ?Type of Home: House ?Home Access: Stairs to enter ?Entrance Stairs-Rails:  None (columns that can be reached) ?Entrance Stairs-Number of Steps: 2 ?Alternate Level Stairs-Number of Steps: 13 ?Home Layout: Two level;Able to live on main level with bedroom/bathroom ?Home Equipment: Grab bars - tub/shower;Grab bars - toilet ?   ?  ?Prior Function Prior Level of Function : Independent/Modified Independent ?  ?  ?  ?  ?  ?  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  ? ? ?Hand Dominance  ?    ? ?  ?Extremity/Trunk Assessment  ? Upper Extremity Assessment ?Upper Extremity Assessment: Generalized weakness ?  ? ?Lower Extremity Assessment ?Lower Extremity Assessment: Generalized weakness ?  ? ?Cervical / Trunk Assessment ?Cervical / Trunk Assessment: Normal  ?Communication  ? Communication: No difficulties  ?Cognition Arousal/Alertness: Awake/alert ?Behavior During Therapy: [facility] for tasks assessed/performed ?Overall Cognitive Status: Within Functional Limits for tasks assessed ?  ?  ?  ?  ?  ?  ?  ?  ?  ?  ?  ?  ?  ?  ?  ?  ?General Comments: alert and oriented x4 ?  ?  ? ?  ?General Comments   ? ?  ?Exercises Total Joint Exercises ?Ankle Circles/Pumps: AROM, Strengthening, Both, 20 reps, Seated ?Quad Sets: AROM, Strengthening, Both, 10 reps, Supine ?Gluteal Sets: AROM, Strengthening, Both, 20 reps, Seated ?Hip ABduction/ADduction: AROM, Strengthening, Both, 10 reps, Supine ?Straight Leg Raises: AROM, Strengthening, Both, 10 reps, Supine ?Long Arc Quad: AROM, Strengthening, Both, 10 reps, Seated ?Marching in Standing: AROM, Strengthening, Both, 10 reps, Seated  ? ?Assessment/Plan  ?  ?PT Assessment Patient needs continued PT services  ?PT Problem List Decreased strength;Decreased activity tolerance;Decreased mobility ? ?   ?  ?PT Treatment Interventions DME instruction;Gait training;Functional mobility training;Therapeutic activities;Therapeutic exercise   ? ?PT Goals (Current goals can be found in the Care Plan section)  ?Acute Rehab PT Goals ?Patient Stated Goal: to get better and go home. ?PT Goal Formulation: With patient ?Time For Goal Achievement: 10/06/21 ?Potential to Achieve Goals: Good ? ?  ?Frequency Min 2X/week ?  ? ? ?Co-evaluation   ?  ?  ?  ?  ? ? ?  ?AM-PAC PT "6 Clicks" Mobility  ?Outcome Measure Help needed turning from your back to your side while in a flat bed without using bedrails?: A Little ?Help needed moving from lying on your back to sitting on the side of a flat bed without using  bedrails?: A Little ?Help needed moving to and from a bed to a chair (including a wheelchair)?: A Little ?Help needed standing up from a chair using your arms (e.g., wheelchair or bedside chair)?: A Little ?Help needed to walk in hospital room?: A Little ?Help needed climbing 3-5 steps with a railing? : A Little ?6 Click Score: 18 ? ?  ?End of Session Equipment Utilized During Treatment: Gait belt ?Activity Tolerance: Patient tolerated treatment well ?Patient left: in bed;with bed alarm set;with family/visitor present ?Nurse Communication: Mobility status ?PT Visit Diagnosis: Unsteadiness on feet (R26.81);Muscle weakness (generalized) (M62.81);Difficulty in walking, not elsewhere classified (R26.2) ?  ? ?Time: 1610-9604 ?PT Time Calculation (min) (ACUTE ONLY): 39 min ? ? ?Charges:   PT Evaluation ?$PT Eval Low Complexity: 1 Low ?PT Treatments ?$Gait Training: 8-22 mins ?$Therapeutic Exercise: 8-22 mins ?$Therapeutic Activity: 8-22 mins ?  ?   ? ? ?Gwenlyn Saran, PT, DPT ?09/22/21, 3:13 PM ? ? ?Christie Nottingham ?09/22/2021, 3:08 PM ? ?

## 2021-09-22 NOTE — Plan of Care (Signed)
?  Problem: Education: ?Goal: Knowledge of General Education information will improve ?Description: Including pain rating scale, medication(s)/side effects and non-pharmacologic comfort measures ?Outcome: Progressing ?  ?Problem: Health Behavior/Discharge Planning: ?Goal: Ability to manage health-related needs will improve ?Outcome: Progressing ?  ?Problem: Clinical Measurements: ?Goal: Ability to maintain clinical measurements within normal limits will improve ?Outcome: Progressing ?Goal: Will remain free from infection ?Outcome: Progressing ?Goal: Diagnostic test results will improve ?Outcome: Progressing ?Goal: Respiratory complications will improve ?Outcome: Progressing ?Goal: Cardiovascular complication will be avoided ?Outcome: Progressing ?  ?Problem: Activity: ?Goal: Risk for activity intolerance will decrease ?Outcome: Progressing ?  ?Problem: Nutrition: ?Goal: Adequate nutrition will be maintained ?Outcome: Progressing ?  ?Problem: Coping: ?Goal: Level of anxiety will decrease ?Outcome: Progressing ?  ?Problem: Elimination: ?Goal: Will not experience complications related to bowel motility ?Outcome: Progressing ?Goal: Will not experience complications related to urinary retention ?Outcome: Progressing ?  ?Problem: Pain Managment: ?Goal: General experience of comfort will improve ?Outcome: Progressing ?  ?Problem: Safety: ?Goal: Ability to remain free from injury will improve ?Outcome: Progressing ?  ?Problem: Skin Integrity: ?Goal: Risk for impaired skin integrity will decrease ?Outcome: Progressing ?  ?Problem: Clinical Measurements: ?Goal: Ability to maintain clinical measurements within normal limits will improve ?Outcome: Progressing ?Goal: Postoperative complications will be avoided or minimized ?Outcome: Progressing ?  ?Problem: Education: ?Goal: Required Educational Video(s) ?Outcome: Progressing ?  ?Problem: Skin Integrity: ?Goal: Demonstration of wound healing without infection will  improve ?Outcome: Progressing ?  ?

## 2021-09-22 NOTE — Assessment & Plan Note (Signed)
Resolved with D5. ?-Monitor sodium ?

## 2021-09-22 NOTE — Progress Notes (Addendum)
Subjective:  ?CC: ?Maria [name] is a 51 y.o. female  Hospital stay day 8, 5 Days Post-Op exploratory laparotomy, redo peptic ulcer perforation repair, G and J tube placement. ? ?HPI: ?No acute issues overnight reported.  Pain slightly better today. ? ?ROS:  ?General: Denies weight loss, weight gain, fatigue, fevers, chills, and night sweats. ?Heart: Denies chest pain, palpitations, racing heart, irregular heartbeat, leg pain or swelling, and decreased activity tolerance. ?Respiratory: Denies breathing difficulty, shortness of breath, wheezing, cough, and sputum. ?GI: Denies change in appetite, heartburn, nausea, vomiting, constipation, diarrhea, and blood in stool. ?GU: Denies difficulty urinating, pain with urinating, urgency, frequency, blood in urine. ? ? ?Objective:  ? ?Temp:  [97.8 ?F (36.6 ?C)-99.1 ?F (37.3 ?C)] 99 ?F (37.2 ?C) (03/24 5374) ?Pulse Rate:  [66-95] 95 (03/24 0737) ?Resp:  [16-20] 18 (03/24 0737) ?BP: (130-175)/(62-76) 137/65 (03/24 0737) ?SpO2:  [98 %-100 %] 98 % (03/24 0737)     Height: '5\' 2"'$  (157.5 cm) Weight: 41.6 kg BMI (Calculated): 16.77  ? ?Intake/Output this shift:  ? ?Intake/Output Summary (Last 24 hours) at 09/22/2021 0947 ?Last data filed at 09/22/2021 0546 ?Gross per 24 hour  ?Intake 4271.13 ml  ?Output 1025 ml  ?Net 3246.13 ml  ? ? ?Constitutional :  alert, cooperative, appears stated age, and no distress  ?Respiratory:  clear to auscultation bilaterally  ?Cardiovascular:  regular rate and rhythm  ?Gastrointestinal: Soft, some TTP around incision, no guarding JP with less sanguinous  drainage today, slightly thinner. NG with bilious output .   ?Skin: Cool and moist. Staples c/d/i  ?Psychiatric: Normal affect, non-agitated, not confused  ?   ?  ?LABS:  ? ?  Latest Ref Rng & Units 09/22/2021  ?  4:20 AM 09/21/2021  ?  4:12 AM 09/20/2021  ?  8:36 AM  ?CMP  ?Glucose 70 - 99 mg/dL 145   111   154    ?BUN 6 - 20 mg/dL '24   25   30    '$ ?Creatinine 0.44 - 1.00 mg/dL 0.87   0.76   0.85    ?Sodium  135 - 145 mmol/L 139   149   147    ?Potassium 3.5 - 5.1 mmol/L 4.4   4.1   4.3    ?Chloride 98 - 111 mmol/L 110   122   119    ?CO2 22 - 32 mmol/L '24   22   22    '$ ?Calcium 8.9 - 10.3 mg/dL 8.4   7.9   8.6    ?Total Protein 6.5 - 8.1 g/dL 5.8   5.1   5.6    ?Total Bilirubin 0.3 - 1.2 mg/dL 1.2   0.9   1.0    ?Alkaline Phos 38 - 126 U/L 178   129   139    ?AST 15 - 41 U/L '29   25   27    '$ ?ALT 0 - 44 U/L '20   15   15    '$ ? ? ?  Latest Ref Rng & Units 09/22/2021  ?  4:20 AM 09/21/2021  ?  4:12 AM 09/20/2021  ?  8:36 AM  ?CBC  ?WBC 4.0 - 10.5 K/uL 13.8   12.7   12.1    ?Hemoglobin 12.0 - 15.0 g/dL 7.9   8.4   8.6    ?Hematocrit 36.0 - 46.0 % 24.6   26.2   27.0    ?Platelets 150 - 400 K/uL 120   125   130    ? ? ?  RADS: ? ?Assessment:  ? ?S/p exploratory laparotomy, redo peptic ulcer perforation repair, G and J tube placement, due to persistent leak after intial graham patch repair ? ?Hgb slightly lower today.  JP more serosanguinous, 170m output past 24hrs. Vitals remain stable.  Continue to monitor for any additional concerns for bleeding. ? ?Due to persistent leak, will continue with bowel rest.  TPN for at least 7days post op. Continue IV ABX and antifungal for intra-abdominal infection coverage.  ? ?Swish and spit ok but strict NPO otherwise. ? ?Pt and family now requesting second opinion at tertiary care center after discussion about what to do if persistent leak does not heal.  Will reach out to discuss.   ? ?labs/images/medications/previous chart entries reviewed personally and relevant changes/updates noted above. ? ? ?

## 2021-09-22 NOTE — Progress Notes (Signed)
Mobility Specialist - Progress Note ? ? 09/22/21 1600  ?Mobility  ?Activity Off unit  ? ? ? ?Pt ambulating hallway with staff at time of attempt. Will attempt another date/time.  ? ? ?Kathee Delton ?Mobility Specialist ?09/22/21, 4:19 PM ? ? ? ? ?

## 2021-09-22 NOTE — Progress Notes (Signed)
?[facility]  ?PROGRESS NOTE  ? ?Subjective:  ? ?Patient seen resting in bed, family at bedside ?Alert and oriented ?Continues to complain of generalized pain and discomfort ?Remains n.p.o. for bowel rest ? ?Objective:  ?Vital signs: ?Blood pressure 137/65, pulse 95, temperature 99 ?F (37.2 ?C), temperature source Oral, resp. rate 18, height '5\' 2"'$  (1.575 m), weight 41.6 kg, last menstrual period 02/24/2018, SpO2 98 %. ? ?Intake/Output Summary (Last 24 hours) at 09/22/2021 1155 ?Last data filed at 09/22/2021 0546 ?Gross per 24 hour  ?Intake 4035.58 ml  ?Output 1000 ml  ?Net 3035.58 ml  ? ? ?Filed Weights  ? 09/17/21 0447 09/18/21 0500 09/19/21 0500  ?Weight: 60 kg 46 kg 41.6 kg  ? ? ? ?Physical Exam: ?General:  No acute distress  ?Head:  Normocephalic, atraumatic. Moist oral mucosal membranes, NGT  ?Eyes:  Anicteric  ?Lungs:   Clear to auscultation, normal effort  ?Heart:  S1S2 no rubs  ?Abdomen:  S/p surgery, JP drain, GJ tube  ?Extremities:  No peripheral edema.  ?Neurologic:  Awake, alert, following commands  ?Skin:  No lesions  ?   ? ? ?Basic Metabolic Panel: ?Recent Labs  ?Lab 09/17/21 ?0507 09/18/21 ?4496 09/19/21 ?7591 09/20/21 ?6384 09/21/21 ?6659 09/22/21 ?9357  ?NA 141 143 150* 147* 149* 139  ?K 3.8 4.5 4.4 4.3 4.1 4.4  ?CL 114* 115* 122* 119* 122* 110  ?CO2 22 21* '23 22 22 24  '$ ?GLUCOSE 128* 207* 168* 154* 111* 145*  ?BUN 71* 51* 41* 30* 25* 24*  ?CREATININE 2.20* 1.49* 1.22* 0.85 0.76 0.87  ?CALCIUM 8.5* 7.8* 8.4* 8.6* 7.9* 8.4*  ?MG 2.1 1.6* 2.4 1.6* 1.6* 1.7  ?PHOS 4.4 4.8* 2.7 2.1* 2.4*  --   ? ? ? ?CBC: ?Recent Labs  ?Lab 09/19/21 ?0439 09/19/21 ?2156 09/20/21 ?0177 09/20/21 ?9390 09/21/21 ?3009 09/22/21 ?0420  ?WBC 14.5*  --  11.2* 12.1* 12.7* 13.8*  ?HGB 8.0* 7.3* 8.3* 8.6* 8.4* 7.9*  ?HCT 24.5* 22.1* 25.3* 27.0* 26.2* 24.6*  ?MCV 101.7*  --  105.4* 100.7* 101.6* 102.1*  ?PLT 138*  --  125* 130* 125* 120*  ? ? ? ? ?Urinalysis: ?No results for input(s): COLORURINE, LABSPEC, [place],  GLUCOSEU, HGBUR, BILIRUBINUR, KETONESUR, PROTEINUR, UROBILINOGEN, NITRITE, LEUKOCYTESUR in the last 72 hours. ? ?Invalid input(s): APPERANCEUR  ? ? ?Imaging: ?No results found. ? ? ?Medications:  ? ? sodium chloride Stopped (09/15/21 1433)  ? fluconazole (DIFLUCAN) IV 200 mg (09/21/21 1547)  ? magnesium sulfate bolus IVPB 2 g (09/22/21 1134)  ? piperacillin-tazobactam (ZOSYN)  IV 3.375 g (09/22/21 1141)  ? TPN ADULT (ION) 65 mL/hr at 09/22/21 0357  ? TPN ADULT (ION)    ? ? Chlorhexidine Gluconate Cloth  6 each Topical Daily  ? folic acid  1 mg Intravenous Daily  ? hydrALAZINE  10 mg Intravenous Q8H  ? insulin aspart  0-9 Units Subcutaneous Q6H  ? pantoprazole  40 mg Intravenous Q12H  ? sodium chloride flush  10-40 mL Intracatheter Q12H  ? ? ?Assessment/ Plan:  ?   ?Principal Problem: ?  Perforated peptic ulcer ([place]) ?Active Problems: ?  Essential hypertension ?  Vitamin D deficiency ?  Anesthesia complication, initial encounter ?  Malnutrition of moderate degree ?  AKI (acute kidney injury) ([place]) ?  Alcohol abuse ?  Hypernatremia ?  Acute postoperative anemia due to expected blood loss ? ?51 year old female with history of anxiety, depression, GERD, hypertension, alcohol abuse admitted to the hospital with history of perforated peptic ulcer  disease.  She is s/p repair with a gastric patch and drainage of the abscess. ?  ?#1: Acute kidney injury: Acute kidney injury most likely secondary to contrast nephropathy.   ? Renal function remains within normal parameters. ?  ?#2: Sepsis: S/p perforated peptic ulcer with abscess.  S/p abscess drainage.  Remains on fluconazole and Zosyn.   ?  ?#3: Perforated peptic ulcer disease: Patient is s/p expiratory laparotomy closure of peptic ulcer with Phillip Heal patch with repair on 09/18/21.  GJ tube placed due to persistent leakage of patch repair.  Bowel rest will continue through the weekend with TPN she had.  PICC placed for IV antibiotics.  ? ?#4 Hypernatremia.  Currently 139.   Appears resolved with TPN modifications. ? ?#5.  Anemia.  Received blood products this admission.  JP drain with sanguinous drainage.  Primary and surgical team monitoring. ? ?Due to renal recovery and normal sodium, we will sign off at this time.  Feel free to contact us with any concerns or questions.  Thank you for including [place] in her care ? ? LOS: 8 ?Colon Flattery, NP ?[facility] ?3/24/202311:55 AM ?  ?

## 2021-09-22 NOTE — Assessment & Plan Note (Signed)
With pneumoperitoneum and intra-abdominal abscesses s/p exploratory laparotomy twice as first time she was continued to have leak.  First surgery was on 09/13/2021 and second was on 09/17/2021. ?-Patient will remain n.p.o. for bowel rest for 7 days and started on TPN. ?-NG tube in place, it was replaced after accidental fall overnight. ?-Continue TPN ?-Continue with Zosyn and Diflucan-being managed by surgery. ?-Continue with pain management and supportive care ?

## 2021-09-22 NOTE — Consult Note (Signed)
PHARMACY - TOTAL PARENTERAL NUTRITION CONSULT NOTE  ? ?Indication: Prolonged ileus ? ?Patient Measurements: ?Height: '5\' 2"'$  (157.5 cm) ?Weight: 41.6 kg (91 lb 11.4 oz) ?IBW/kg (Calculated) : 50.1 ?TPN AdjBW (KG): 53.5 ?Body mass index is 16.77 kg/m?. ?Usual Weight: 55kg ? ?Assessment: [name] s/p [place] of non-healing perforated peptic ulcer c/b prolonged ileus. GJ tube placed 3/19 and Pharmacy consulted for mgmt of TPN and electrolytes. ? ?Glucose / Insulin: BG previous 24h 111 - 151  ?--4 units SSI required ?Electrolytes: hypernatremia, hypomagnesemia, hypophosphatemia ?Renal: Scr 2.63-->0.76 ?Hepatic: LFTs wnl ?Intake / Output; MIVF: no MIVF ?GI Imaging: ?3/19 CT abd -- Anterior antral gastric wall perforation with associated moderate volume pneumoperitoneum, small volume ascites, extravasated PO contrast. Colonic diverticulosis & Chronic pancreatitis. ? ?3/16 CT abd/pelvis:  Decreased pneumoperitoneum, Phillip Heal patch repair of the perforated ?distal stomach, postoperative drain at the greater omentum. Small volume mostly simple density fluid in the abdomen is primarily around the liver. No bowel obstruction. Chronic calcific pancreatitis ? ?GI Surgeries / Procedures: 03/19 exploratory laparotomy ? ?Central access: 09/18/21 ?TPN start date: 09/18/21 ? ?Nutritional Goals: ?Goal TPN rate is 65 mL/hr (provides 90.1 g of protein and 1676.7 kcals per day) ? ?RD Assessment: ?Estimated Needs ?Total Energy Estimated Needs: 1600-1800kcal/day ?Total Protein Estimated Needs: 80-90g/day ?Total Fluid Estimated Needs: 1.6-1.8L/day ? ?Current Nutrition:  ?NPO ? ?Plan:  ?continue TPN at goal rate of 65 mL/hr at 1800 (total volume including overfill 1660 mL) ?Nutritional Components ?Amino acids (using 10% Travasol): 90.1 grams ?Dextrose: 249.6 grams ?Lipids (using 20% SMOFlipids): 46.8 grams ?Electrolytes in TPN: Na 371mq/L, K 473m/L, Ca 71m771mL, Mg 18m41m, and Phos 20mm80m. Cl:Ac 1:2 ?A small amount of sodium is being  added back as of 03/24 ?Add standard MVI, thiamine 100 mg and trace elements to TPN ?2 grams IV magnesium sulfate x 1 ?Continue Sensitive SSI to q6h and adjust as needed  ?Monitor TPN labs on Mon/Thurs when stable, daily until then ? ?RodneDallie Piles4/2023,7:07 AM ? ?

## 2021-09-22 NOTE — Progress Notes (Signed)
?Progress Note ? ? ?Patient: Maria [name] KGY:[id number] DOB: 1970/12/08 DOA: 09/13/2021     8 ?DOS: the patient was seen and examined on 09/22/2021 ?  ?Brief hospital course: ?Taken from prior notes. ? ?Maria [name] is a 51 y.o. female with PMH of HTN, pancreatitis, GERD/gastritis, anxiety, depression, alcohol abuse presented at [facility] ED with abdominal pain.  Patient was admitted on 09/13/2021 under general surgery due to pneumoperitoneum secondary to perforated peptic ulcer, patient was taken to the OR s/p ex lap with primary closure of perforated peptic ulcer with Phillip Heal patch repair with intra-abdominal abscess drainage.  Postop patient became unresponsive so code stroke was called but work-up was negative.  Patient was in the ICU, downgraded on the medical floor on 09/16/2021. ? ?Patient underwent another exploratory laparotomy yesterday on 09/17/2021 when peptic ulcer perforation repair was redone along with G and J-tube placement, intra-abdominal abscess was also drained. ? ?NG tube in place. ?Patient was started on TPN today, 09/18/2021 as surgery would like to give her a bowel rest for at least for 1 week. ? ?Foley catheter in place-surgery wants to keep for another 1 to 2 days. ?PICC line was placed on 09/18/2021 to start TPN ? ?3/21: Patient overall stable, labs with hypernatremia with sodium of 150.  She was not on any IV fluid except TPN, giving some D5. ?Surgery wants to give bowel rest for 1 week. ?Foley can be removed today. ? ?3/22: Overnight NG tube was accidentally fallen off and was replaced by nursing staff.  KUB with some air under the diaphragm, per surgery that is normal but they are recommending not to replace NG tube without notifying them first as she is prone to more perforation. ? ?3/23: No acute concern.  Patient remained on TPN.  Hopefully GI series earlier next week and if there is no leak then slowly start enteric feeding. ?IV hydralazine was made as schedule due to elevated blood  pressure. ? ?3/24: No concerns overnight.  Patient remained on TPN.  Pain improving.  Passing flatus and had a small bowel movement yesterday. ?Will remain n.p.o. till early next week-most likely GI series by Monday or Tuesday. ? ? ?Assessment and Plan: ?* Perforated peptic ulcer ([place]) ?With pneumoperitoneum and intra-abdominal abscesses s/p exploratory laparotomy twice as first time she was continued to have leak.  First surgery was on 09/13/2021 and second was on 09/17/2021. ?-Patient will remain n.p.o. for bowel rest for 7 days and started on TPN. ?-NG tube in place, it was replaced after accidental fall overnight. ?-Continue TPN ?-Continue with Zosyn and Diflucan-being managed by surgery. ?-Continue with pain management and supportive care ? ?Malnutrition of moderate degree ?Estimated body mass index is 16.77 kg/m? as calculated from the following: ?  Height as of this encounter: '5\' 2"'$  (1.575 m). ?  Weight as of this encounter: 41.6 kg.  ? ?-Patient is being started on TPN ? ?Essential hypertension ?Blood pressure remained elevated .  Echocardiogram normal. ?-Make hydralazine as scheduled, 10 mg IV every 8 hourly. ?-Monitor blood pressure ? ?AKI (acute kidney injury) ([place]) ?Resolved.  Creatinine normalized ?-Monitor renal function ?-Avoid nephrotoxins ? ?Vitamin D deficiency ?Vitamin D level at 11.5. ?-We will start vitamin D 50,000 units p.o. weekly once able to take p.o. ? ?Alcohol abuse ?No withdrawal symptoms. ?-Continue with IV folic acid and thiamine.  We will switch to p.o. once able to take  Oral. ?-Continue with CIWA protocol ? ?Acute postoperative anemia due to expected blood loss ?Patient received 2 unit  of PRBC when her hemoglobin dropped to 6.4 after second surgery.  She also received 1 unit of FFP due to INR of 1.7, followed by 2 more units of FFP by surgery. ?Hemoglobin with slow decline, at 7.9 today. ?Anemia panel consistent with anemia of chronic disease.  No deficiencies. ?-Monitor  hemoglobin ?-Transfuse if below 7 ? ? ?Hypernatremia ?Resolved with D5. ?-Monitor sodium ? ?  ? ?Subjective: Patient was seen and examined today.  Pain seems improving, passing flatus and had a small bowel movement yesterday.  Mother at bedside. ? ?Physical Exam: ?Vitals:  ? 09/21/21 1947 09/22/21 0305 09/22/21 0737 09/22/21 1520  ?BP: (!) 144/76 130/63 137/65 123/66  ?Pulse: 83 82 95 86  ?Resp: '20 18 18 18  '$ ?Temp: 98.1 ?F (36.7 ?C) 99.1 ?F (37.3 ?C) 99 ?F (37.2 ?C) 98.4 ?F (36.9 ?C)  ?TempSrc:   Oral Oral  ?SpO2: 100% 98% 98% 100%  ?Weight:      ?Height:      ? ?General.     In no acute distress.  NG tube in place ?Pulmonary.  Lungs clear bilaterally, normal respiratory effort. ?CV.  Regular rate and rhythm, no JVD, rub or murmur. ?Abdomen.  Soft, nontender, nondistended, BS positive.  To drain in place with serosanguineous discharge. ?CNS.  Alert and oriented x3.  No focal neurologic deficit. ?Extremities.  No edema, no cyanosis, pulses intact and symmetrical. ?Psychiatry.  Judgment and insight appears normal. ? ?Data Reviewed: ?Prior notes and labs reviewed ? ?Family Communication: Discussed with mother at bedside ? ?Disposition: ?Status is: Inpatient ?Remains inpatient appropriate because: Severity of illness ? ? Planned Discharge Destination: Home with Home Health ? ? ?Time spent: 44 minutes ? ?This record has been created using Systems analyst. Errors have been sought and corrected,but may not always be located. Such creation errors do not reflect on the standard of care. ? ?Author: ?Lorella Nimrod, MD ?09/22/2021 3:51 PM ? ?For on call review [email or web address].  ?

## 2021-09-22 NOTE — Assessment & Plan Note (Signed)
Resolved.  Creatinine normalized ?-Monitor renal function ?-Avoid nephrotoxins ?

## 2021-09-23 DIAGNOSIS — K275 Chronic or unspecified peptic ulcer, site unspecified, with perforation: Secondary | ICD-10-CM | POA: Diagnosis not present

## 2021-09-23 LAB — CBC
HCT: 23.6 % — ABNORMAL LOW (ref 36.0–46.0)
Hemoglobin: 7.5 g/dL — ABNORMAL LOW (ref 12.0–15.0)
MCH: 32.2 pg (ref 26.0–34.0)
MCHC: 31.8 g/dL (ref 30.0–36.0)
MCV: 101.3 fL — ABNORMAL HIGH (ref 80.0–100.0)
Platelets: 120 10*3/uL — ABNORMAL LOW (ref 150–400)
RBC: 2.33 MIL/uL — ABNORMAL LOW (ref 3.87–5.11)
RDW: 17.6 % — ABNORMAL HIGH (ref 11.5–15.5)
WBC: 12.6 10*3/uL — ABNORMAL HIGH (ref 4.0–10.5)
nRBC: 0 % (ref 0.0–0.2)

## 2021-09-23 LAB — GLUCOSE, CAPILLARY
Glucose-Capillary: 131 mg/dL — ABNORMAL HIGH (ref 70–99)
Glucose-Capillary: 135 mg/dL — ABNORMAL HIGH (ref 70–99)
Glucose-Capillary: 135 mg/dL — ABNORMAL HIGH (ref 70–99)
Glucose-Capillary: 146 mg/dL — ABNORMAL HIGH (ref 70–99)
Glucose-Capillary: 157 mg/dL — ABNORMAL HIGH (ref 70–99)

## 2021-09-23 LAB — COMPREHENSIVE METABOLIC PANEL
ALT: 20 U/L (ref 0–44)
AST: 26 U/L (ref 15–41)
Albumin: 2 g/dL — ABNORMAL LOW (ref 3.5–5.0)
Alkaline Phosphatase: 185 U/L — ABNORMAL HIGH (ref 38–126)
Anion gap: 8 (ref 5–15)
BUN: 27 mg/dL — ABNORMAL HIGH (ref 6–20)
CO2: 23 mmol/L (ref 22–32)
Calcium: 8.4 mg/dL — ABNORMAL LOW (ref 8.9–10.3)
Chloride: 107 mmol/L (ref 98–111)
Creatinine, Ser: 0.99 mg/dL (ref 0.44–1.00)
GFR, Estimated: >60 mL/min (ref >60)
Glucose, Bld: 116 mg/dL — ABNORMAL HIGH (ref 70–99)
Potassium: 4.4 mmol/L (ref 3.5–5.1)
Sodium: 138 mmol/L (ref 135–145)
Total Bilirubin: 1.6 mg/dL — ABNORMAL HIGH (ref 0.3–1.2)
Total Protein: 5.9 g/dL — ABNORMAL LOW (ref 6.5–8.1)

## 2021-09-23 LAB — MAGNESIUM: Magnesium: 2.3 mg/dL (ref 1.7–2.4)

## 2021-09-23 LAB — PROTIME-INR
INR: 1.2 (ref 0.8–1.2)
Prothrombin Time: 15.6 seconds — ABNORMAL HIGH (ref 11.4–15.2)

## 2021-09-23 LAB — PREPARE RBC (CROSSMATCH)

## 2021-09-23 MED ORDER — SODIUM CHLORIDE 0.9% IV SOLUTION: Freq: Once | INTRAVENOUS | Status: AC

## 2021-09-23 MED ORDER — TRAVASOL 10 % IV SOLN
INTRAVENOUS | Status: AC
  Filled 2021-09-23: qty 900.1

## 2021-09-23 NOTE — Progress Notes (Signed)
Patient is voiding frequently initial shift bladder scan was 11m, patient had voided twice already prior to scan.  ?

## 2021-09-23 NOTE — Assessment & Plan Note (Signed)
With pneumoperitoneum and intra-abdominal abscesses s/p exploratory laparotomy twice as first time she was continued to have leak.  First surgery was on 09/13/2021 and second was on 09/17/2021. ?Slight increase in alkaline phosphatase and T. bili today. ?-Patient will remain n.p.o. for bowel rest for 7 days and started on TPN. ?-NG tube in place, it was replaced after accidental fall overnight. ?-Continue TPN ?-Monitor liver function ?-Continue with Zosyn and Diflucan-being managed by surgery. ?-Continue with pain management and supportive care ?

## 2021-09-23 NOTE — Plan of Care (Signed)
  Problem: Activity: Goal: Risk for activity intolerance will decrease Outcome: Progressing   Problem: Nutrition: Goal: Adequate nutrition will be maintained Outcome: Progressing   Problem: Coping: Goal: Level of anxiety will decrease Outcome: Progressing   Problem: Elimination: Goal: Will not experience complications related to bowel motility Outcome: Progressing Goal: Will not experience complications related to urinary retention Outcome: Progressing   

## 2021-09-23 NOTE — Plan of Care (Signed)
°  Problem: Education: °Goal: Knowledge of General Education information will improve °Description: Including pain rating scale, medication(s)/side effects and non-pharmacologic comfort measures °Outcome: Progressing °  °Problem: Health Behavior/Discharge Planning: °Goal: Ability to manage health-related needs will improve °Outcome: Progressing °  °Problem: Nutrition: °Goal: Adequate nutrition will be maintained °Outcome: Progressing °  °Problem: Coping: °Goal: Level of anxiety will decrease °Outcome: Progressing °  °Problem: Elimination: °Goal: Will not experience complications related to bowel motility °Outcome: Progressing °Goal: Will not experience complications related to urinary retention °Outcome: Progressing °  °Problem: Pain Managment: °Goal: General experience of comfort will improve °Outcome: Progressing °  °Problem: Safety: °Goal: Ability to remain free from injury will improve °Outcome: Progressing °  °Problem: Skin Integrity: °Goal: Risk for impaired skin integrity will decrease °Outcome: Progressing °  °

## 2021-09-23 NOTE — Progress Notes (Addendum)
?Progress Note ? ? ?Patient: Maria [name] JQB:[id number] DOB: April 28, 1971 DOA: 09/13/2021     9 ?DOS: the patient was seen and examined on 09/23/2021 ?  ?Brief hospital course: ?Taken from prior notes. ? ?Areeba Sulser is a 51 y.o. female with PMH of HTN, pancreatitis, GERD/gastritis, anxiety, depression, alcohol abuse presented at [facility] ED with abdominal pain.  Patient was admitted on 09/13/2021 under general surgery due to pneumoperitoneum secondary to perforated peptic ulcer, patient was taken to the OR s/p ex lap with primary closure of perforated peptic ulcer with Phillip Heal patch repair with intra-abdominal abscess drainage.  Postop patient became unresponsive so code stroke was called but work-up was negative.  Patient was in the ICU, downgraded on the medical floor on 09/16/2021. ? ?Patient underwent another exploratory laparotomy yesterday on 09/17/2021 when peptic ulcer perforation repair was redone along with G and J-tube placement, intra-abdominal abscess was also drained. ? ?NG tube in place. ?Patient was started on TPN today, 09/18/2021 as surgery would like to give her a bowel rest for at least for 1 week. ? ?Foley catheter in place-surgery wants to keep for another 1 to 2 days. ?PICC line was placed on 09/18/2021 to start TPN ? ?3/21: Patient overall stable, labs with hypernatremia with sodium of 150.  She was not on any IV fluid except TPN, giving some D5. ?Surgery wants to give bowel rest for 1 week. ?Foley can be removed today. ? ?3/22: Overnight NG tube was accidentally fallen off and was replaced by nursing staff.  KUB with some air under the diaphragm, per surgery that is normal but they are recommending not to replace NG tube without notifying them first as she is prone to more perforation. ? ?3/23: No acute concern.  Patient remained on TPN.  Hopefully GI series earlier next week and if there is no leak then slowly start enteric feeding. ?IV hydralazine was made as schedule due to elevated blood  pressure. ? ?3/24: No concerns overnight.  Patient remained on TPN.  Pain improving.  Passing flatus and had a small bowel movement yesterday. ?Will remain n.p.o. till early next week-most likely GI series by Monday or Tuesday. ? ?3/25: Mild worsening of alkaline phosphatase and T. bili today.  Discussed with surgery and they are thinking might be some acalculus cholecystitis as there was some biliary sludge noted on prior CT abdomen.  No other specific instructions except continue to monitor. ?Downward trending hemoglobin, at 7.5 now.  Ordered 2 more unit of PRBC ? ? ?Assessment and Plan: ?* Perforated peptic ulcer ([place]) ?With pneumoperitoneum and intra-abdominal abscesses s/p exploratory laparotomy twice as first time she was continued to have leak.  First surgery was on 09/13/2021 and second was on 09/17/2021. ?Slight increase in alkaline phosphatase and T. bili today. ?-Patient will remain n.p.o. for bowel rest for 7 days and started on TPN. ?-NG tube in place, it was replaced after accidental fall overnight. ?-Continue TPN ?-Monitor liver function ?-Continue with Zosyn and Diflucan-being managed by surgery. ?-Continue with pain management and supportive care ? ?Malnutrition of moderate degree ?Estimated body mass index is 16.77 kg/m? as calculated from the following: ?  Height as of this encounter: '5\' 2"'$  (1.575 m). ?  Weight as of this encounter: 41.6 kg.  ? ?-Patient is being started on TPN ? ?Essential hypertension ?Blood pressure remained elevated .  Echocardiogram normal. ?-Make hydralazine as scheduled, 10 mg IV every 8 hourly. ?-Monitor blood pressure ? ?AKI (acute kidney injury) ([place]) ?Resolved.  Creatinine normalized ?-Monitor  renal function ?-Avoid nephrotoxins ? ?Vitamin D deficiency ?Vitamin D level at 11.5. ?-We will start vitamin D 50,000 units p.o. weekly once able to take p.o. ? ?Alcohol abuse ?No withdrawal symptoms. ?-Continue with IV folic acid and thiamine.  We will switch to p.o. once able to  take  Oral. ?-Continue with CIWA protocol ? ?Acute postoperative anemia due to expected blood loss ?Patient received 2 unit of PRBC when her hemoglobin dropped to 6.4 after second surgery.  She also received 1 unit of FFP due to INR of 1.7, followed by 2 more units of FFP by surgery. ?Hemoglobin with slow decline, at 7.9 today. ?Anemia panel consistent with anemia of chronic disease.  No deficiencies. ?-Monitor hemoglobin ?-Transfuse if below 7 ? ? ?Hypernatremia ?Resolved with D5. ?-Monitor sodium ? ?  ? ?Subjective: Patient was seen and examined today.  No new complaints.  Stating that pain is improving. ? ?Physical Exam: ?Vitals:  ? 09/23/21 0756 09/23/21 1022 09/23/21 1051 09/23/21 1345  ?BP: 127/65 128/62 128/64 123/62  ?Pulse: 81 80 83 92  ?Resp: '18 16 14 16  '$ ?Temp: 98.4 ?F (36.9 ?C) 98.7 ?F (37.1 ?C) 98.6 ?F (37 ?C) 98.9 ?F (37.2 ?C)  ?TempSrc:  Oral Oral Oral  ?SpO2: 100% 98% 99% 100%  ?Weight:      ?Height:      ? ?General.     In no acute distress.  NG tube in place ?Pulmonary.  Lungs clear bilaterally, normal respiratory effort. ?CV.  Regular rate and rhythm, no JVD, rub or murmur. ?Abdomen.  Soft, nontender, nondistended, BS positive. ?CNS.  Alert and oriented x3.  No focal neurologic deficit. ?Extremities.  No edema, no cyanosis, pulses intact and symmetrical. ?Psychiatry.  Judgment and insight appears normal. ? ?Data Reviewed: ?Prior notes and labs reviewed ? ?Family Communication: Discussed with patient ? ?Disposition: ?Status is: Inpatient ?Remains inpatient appropriate because: Severity of illness ? ? Planned Discharge Destination: Home ? ?Time spent: 45 minutes ? ?This record has been created using Systems analyst. Errors have been sought and corrected,but may not always be located. Such creation errors do not reflect on the standard of care. ? ?Author: ?Lorella Nimrod, MD ?09/23/2021 2:55 PM ? ?For on call review [email or web address].  ?

## 2021-09-23 NOTE — Consult Note (Signed)
PHARMACY - TOTAL PARENTERAL NUTRITION CONSULT NOTE  ? ?Indication: Prolonged ileus ? ?Patient Measurements: ?Height: '5\' 2"'$  (157.5 cm) ?Weight: 41.6 kg (91 lb 11.4 oz) ?IBW/kg (Calculated) : 50.1 ?TPN AdjBW (KG): 53.5 ?Body mass index is 16.77 kg/m?. ?Usual Weight: 55kg ? ?Assessment: [name] s/p Delford Field of non-healing perforated peptic ulcer c/b prolonged ileus. GJ tube placed 3/19 and Pharmacy consulted for mgmt of TPN and electrolytes. ? ?Glucose / Insulin: BG previous 24h 116 - 146  ?--6 units SSI required ?Electrolytes: WNL ?Renal: Scr 2.63-->0.99 ?Hepatic: LFTs wnl ?Intake / Output; MIVF: no MIVF ?GI Imaging: ?3/19 CT abd -- Anterior antral gastric wall perforation with associated moderate volume pneumoperitoneum, small volume ascites, extravasated PO contrast. Colonic diverticulosis & Chronic pancreatitis. ? ?3/16 CT abd/pelvis:  Decreased pneumoperitoneum, Phillip Heal patch repair of the perforated ?distal stomach, postoperative drain at the greater omentum. Small volume mostly simple density fluid in the abdomen is primarily around the liver. No bowel obstruction. Chronic calcific pancreatitis ? ?GI Surgeries / Procedures: 03/19 exploratory laparotomy ? ?Central access: 09/18/21 ?TPN start date: 09/18/21 ? ?Nutritional Goals: ?Goal TPN rate is 65 mL/hr (provides 90.1 g of protein and 1676.7 kcals per day) ? ?RD Assessment: ?Estimated Needs ?Total Energy Estimated Needs: 1600-1800kcal/day ?Total Protein Estimated Needs: 80-90g/day ?Total Fluid Estimated Needs: 1.6-1.8L/day ? ?Current Nutrition:  ?NPO ? ?Plan:  ?continue TPN at goal rate of 65 mL/hr at 1800 (total volume including overfill 1660 mL) ?Nutritional Components ?Amino acids (using 10% Travasol): 90.1 grams ?Dextrose: 249.6 grams ?Lipids (using 20% SMOFlipids): 46.8 grams ?Electrolytes in TPN: Na 24mq/L, K 438m/L, Ca 67m44mL, Mg 76m68m, and Phos 20mm6m. Cl:Ac 1:2 ?A small amount of sodium is being added back as of 03/24 ?Add standard MVI,  thiamine 100 mg and trace elements to TPN ?Continue Sensitive SSI to q6h and adjust as needed  ?Monitor TPN labs on Mon/Thurs when stable, daily until then ? ?Jalene Lacko A Marcus Schwandt ?09/23/2021,9:17 AM ? ?

## 2021-09-23 NOTE — Progress Notes (Signed)
Subjective:  ?CC: ?Maria [name] is a 50 y.o. female  Hospital stay day 9, 6 Days Post-Op exploratory laparotomy, redo peptic ulcer perforation repair, G and J tube placement.  She denies any melanotic stools, NG output appears golden. ? ?HPI: ?No acute issues overnight reported.  Pain slightly better today.  She reports improvement, despite mild deterioration of alkaline phosphatase and bilirubin. ?We discussed this may be stasis due to TPN/n.p.o. status. ? ?ROS:  ?General: Denies weight loss, weight gain, fatigue, fevers, chills, and night sweats. ?Heart: Denies chest pain, palpitations, racing heart, irregular heartbeat, leg pain or swelling, and decreased activity tolerance. ?Respiratory: Denies breathing difficulty, shortness of breath, wheezing, cough, and sputum. ?GI: Denies change in appetite, heartburn, nausea, vomiting, constipation, diarrhea, and blood in stool. ?GU: Denies difficulty urinating, pain with urinating, urgency, frequency, blood in urine. ? ? ?Objective:  ? ?Temp:  [97.9 ?F (36.6 ?C)-98.6 ?F (37 ?C)] 98.4 ?F (36.9 ?C) (03/25 0756) ?Pulse Rate:  [76-88] 81 (03/25 0756) ?Resp:  [18-20] 18 (03/25 0756) ?BP: (118-127)/(56-67) 127/65 (03/25 0756) ?SpO2:  [100 %] 100 % (03/25 0756)     Height: '5\' 2"'$  (157.5 cm) Weight: 41.6 kg BMI (Calculated): 16.77  ? ?Intake/Output this shift:  ? ?Intake/Output Summary (Last 24 hours) at 09/23/2021 0914 ?Last data filed at 09/23/2021 0407 ?Gross per 24 hour  ?Intake 668.3 ml  ?Output 765 ml  ?Net -96.7 ml  ? ? ? ?Constitutional :  alert, cooperative, appears stated age, and no distress  ?Respiratory:  clear to auscultation bilaterally  ?Cardiovascular:  regular rate and rhythm  ?Gastrointestinal: G-tube, JP, and jejunostomy tube all intact and in place.  No drainage, no remarkable tenderness.  ?Skin: Cool and moist. Staples c/d/I skin without erythema.  ?Psychiatric: Normal affect, non-agitated, not confused  ?   ?  ?LABS:  ? ?  Latest Ref Rng & Units 09/23/2021  ?   5:03 AM 09/22/2021  ?  4:20 AM 09/21/2021  ?  4:12 AM  ?CMP  ?Glucose 70 - 99 mg/dL 116   145   111    ?BUN 6 - 20 mg/dL '27   24   25    '$ ?Creatinine 0.44 - 1.00 mg/dL 0.99   0.87   0.76    ?Sodium 135 - 145 mmol/L 138   139   149    ?Potassium 3.5 - 5.1 mmol/L 4.4   4.4   4.1    ?Chloride 98 - 111 mmol/L 107   110   122    ?CO2 22 - 32 mmol/L '23   24   22    '$ ?Calcium 8.9 - 10.3 mg/dL 8.4   8.4   7.9    ?Total Protein 6.5 - 8.1 g/dL 5.9   5.8   5.1    ?Total Bilirubin 0.3 - 1.2 mg/dL 1.6   1.2   0.9    ?Alkaline Phos 38 - 126 U/L 185   178   129    ?AST 15 - 41 U/L '26   29   25    '$ ?ALT 0 - 44 U/L '20   20   15    '$ ? ? ?  Latest Ref Rng & Units 09/23/2021  ?  5:03 AM 09/22/2021  ?  4:20 AM 09/21/2021  ?  4:12 AM  ?CBC  ?WBC 4.0 - 10.5 K/uL 12.6   13.8   12.7    ?Hemoglobin 12.0 - 15.0 g/dL 7.5   7.9   8.4    ?  Hematocrit 36.0 - 46.0 % 23.6   24.6   26.2    ?Platelets 150 - 400 K/uL 120   120   125    ? ? ?RADS: ? ?Assessment:  ? ?S/p exploratory laparotomy, redo peptic ulcer perforation repair, G and J tube placement, due to persistent leak after intial graham patch repair ? ?Hgb slightly lower today, receiving transfusion.   ?JP more serosanguinous, 62m output past 24hrs. Vitals remain stable.  Continue to monitor for any additional concerns for bleeding. ? ?Due to persistent leak, will continue with bowel rest.  TPN for at least 7days post op. Continue IV ABX and antifungal for intra-abdominal infection coverage.  ? ?Swish and spit ok but strict NPO otherwise. ? ?DRonny Bacon M.D., FACS ?[facility] ? ?09/23/2021 ; 12:51 PM ? ? ? ?

## 2021-09-24 DIAGNOSIS — K275 Chronic or unspecified peptic ulcer, site unspecified, with perforation: Secondary | ICD-10-CM | POA: Diagnosis not present

## 2021-09-24 LAB — BPAM RBC
Blood Product Expiration Date: 202304272359
Blood Product Expiration Date: 202304272359
ISSUE DATE / TIME: 202303251031
ISSUE DATE / TIME: 202303251703
Unit Type and Rh: 5100
Unit Type and Rh: 5100

## 2021-09-24 LAB — MAGNESIUM: Magnesium: 1.9 mg/dL (ref 1.7–2.4)

## 2021-09-24 LAB — BASIC METABOLIC PANEL
Anion gap: 7 (ref 5–15)
BUN: 24 mg/dL — ABNORMAL HIGH (ref 6–20)
CO2: 22 mmol/L (ref 22–32)
Calcium: 8.7 mg/dL — ABNORMAL LOW (ref 8.9–10.3)
Chloride: 108 mmol/L (ref 98–111)
Creatinine, Ser: 0.93 mg/dL (ref 0.44–1.00)
GFR, Estimated: >60 mL/min (ref >60)
Glucose, Bld: 135 mg/dL — ABNORMAL HIGH (ref 70–99)
Potassium: 4.7 mmol/L (ref 3.5–5.1)
Sodium: 137 mmol/L (ref 135–145)

## 2021-09-24 LAB — TYPE AND SCREEN
ABO/RH(D): O POS
Antibody Screen: NEGATIVE
Unit division: 0
Unit division: 0

## 2021-09-24 LAB — CBC
HCT: 31.6 % — ABNORMAL LOW (ref 36.0–46.0)
HCT: 32.2 % — ABNORMAL LOW (ref 36.0–46.0)
Hemoglobin: 10.3 g/dL — ABNORMAL LOW (ref 12.0–15.0)
Hemoglobin: 10.4 g/dL — ABNORMAL LOW (ref 12.0–15.0)
MCH: 31.1 pg (ref 26.0–34.0)
MCH: 31.4 pg (ref 26.0–34.0)
MCHC: 32.3 g/dL (ref 30.0–36.0)
MCHC: 32.6 g/dL (ref 30.0–36.0)
MCV: 95.5 fL (ref 80.0–100.0)
MCV: 97.3 fL (ref 80.0–100.0)
Platelets: 122 10*3/uL — ABNORMAL LOW (ref 150–400)
Platelets: 123 10*3/uL — ABNORMAL LOW (ref 150–400)
RBC: 3.31 MIL/uL — ABNORMAL LOW (ref 3.87–5.11)
RBC: 3.31 MIL/uL — ABNORMAL LOW (ref 3.87–5.11)
RDW: 17.5 % — ABNORMAL HIGH (ref 11.5–15.5)
RDW: 17.5 % — ABNORMAL HIGH (ref 11.5–15.5)
WBC: 11 10*3/uL — ABNORMAL HIGH (ref 4.0–10.5)
WBC: 12.8 10*3/uL — ABNORMAL HIGH (ref 4.0–10.5)
nRBC: 0 % (ref 0.0–0.2)
nRBC: 0 % (ref 0.0–0.2)

## 2021-09-24 LAB — PROTIME-INR
INR: 1.2 (ref 0.8–1.2)
Prothrombin Time: 15.2 seconds (ref 11.4–15.2)

## 2021-09-24 LAB — GLUCOSE, CAPILLARY
Glucose-Capillary: 133 mg/dL — ABNORMAL HIGH (ref 70–99)
Glucose-Capillary: 139 mg/dL — ABNORMAL HIGH (ref 70–99)
Glucose-Capillary: 137 mg/dL — ABNORMAL HIGH (ref 70–99)

## 2021-09-24 LAB — PHOSPHORUS: Phosphorus: 3.8 mg/dL (ref 2.5–4.6)

## 2021-09-24 MED ORDER — TRAVASOL 10 % IV SOLN
INTRAVENOUS | Status: AC
  Filled 2021-09-24: qty 900.1

## 2021-09-24 NOTE — Progress Notes (Signed)
Subjective:  ?CC: ?Maria [name] is a 51 y.o. female  Hospital stay day 10, 7 Days Post-Op exploratory laparotomy, redo peptic ulcer perforation repair, G and J tube placement.  She denies any melanotic stools, NG output appears golden. ? ?HPI: ?No acute issues overnight reported.  She is sitting up on the side of her bed today.  She reports improvement, we found the CT scan to be encouraging, despite the thoracic findings.   ? ? ? ? ?Objective:  ? ?Temp:  [98.3 ?F (36.8 ?C)-99.1 ?F (37.3 ?C)] 98.3 ?F (36.8 ?C) (03/26 0758) ?Pulse Rate:  [76-92] 90 (03/26 0758) ?Resp:  [14-20] 18 (03/26 0758) ?BP: (123-143)/(62-70) 133/62 (03/26 0758) ?SpO2:  [95 %-100 %] 96 % (03/26 0758)     Height: '5\' 2"'$  (157.5 cm) Weight: 41.6 kg BMI (Calculated): 16.77  ? ?Intake/Output this shift:  ? ?Intake/Output Summary (Last 24 hours) at 09/24/2021 0836 ?Last data filed at 09/24/2021 0544 ?Gross per 24 hour  ?Intake 3454.35 ml  ?Output 1503 ml  ?Net 1951.35 ml  ? ? ? ?Constitutional :  alert, cooperative, appears stated age, and no distress  ?Respiratory:  clear to auscultation bilaterally  ?Cardiovascular:  regular rate and rhythm  ?Gastrointestinal: G-tube, JP, and jejunostomy tube all intact and in place.  No drainage, no remarkable tenderness.  ?Skin: Cool and moist. Staples c/d/I skin without erythema.  ?Psychiatric: Normal affect, non-agitated, not confused  ?   ?  ?LABS:  ? ?  Latest Ref Rng & Units 09/24/2021  ?  4:08 AM 09/23/2021  ?  5:03 AM 09/22/2021  ?  4:20 AM  ?CMP  ?Glucose 70 - 99 mg/dL 135   116   145    ?BUN 6 - 20 mg/dL '24   27   24    '$ ?Creatinine 0.44 - 1.00 mg/dL 0.93   0.99   0.87    ?Sodium 135 - 145 mmol/L 137   138   139    ?Potassium 3.5 - 5.1 mmol/L 4.7   4.4   4.4    ?Chloride 98 - 111 mmol/L 108   107   110    ?CO2 22 - 32 mmol/L '22   23   24    '$ ?Calcium 8.9 - 10.3 mg/dL 8.7   8.4   8.4    ?Total Protein 6.5 - 8.1 g/dL  5.9   5.8    ?Total Bilirubin 0.3 - 1.2 mg/dL  1.6   1.2    ?Alkaline Phos 38 - 126 U/L  185    178    ?AST 15 - 41 U/L  26   29    ?ALT 0 - 44 U/L  20   20    ? ? ?  Latest Ref Rng & Units 09/24/2021  ?  4:08 AM 09/23/2021  ? 11:45 PM 09/23/2021  ?  5:03 AM  ?CBC  ?WBC 4.0 - 10.5 K/uL 11.0   12.8   12.6    ?Hemoglobin 12.0 - 15.0 g/dL 10.4   10.3   7.5    ?Hematocrit 36.0 - 46.0 % 32.2   31.6   23.6    ?Platelets 150 - 400 K/uL 122   123   120    ? ? ?RADS: ? ?Assessment:  ? ?S/p exploratory laparotomy, redo peptic ulcer perforation repair, G and J tube placement, due to persistent leak after intial graham patch repair ? ?Hgb > 10, now. ?JP more serous, 47m output past 24hrs. Vitals remain  stable.  Continue to monitor for any additional concerns for bleeding. ? ?Due to persistent leak, will continue with bowel rest.  TPN for at least 7days post op. Continue IV ABX and antifungal for intra-abdominal infection coverage.  ? ?Swish and spit ok but strict NPO otherwise. ? ?Ronny Bacon, M.D., FACS ?[facility] ? ?09/24/2021 ; 8:36 AM ? ? ? ?

## 2021-09-24 NOTE — Consult Note (Signed)
PHARMACY - TOTAL PARENTERAL NUTRITION CONSULT NOTE  ? ?Indication: Prolonged ileus ? ?Patient Measurements: ?Height: '5\' 2"'$  (157.5 cm) ?Weight: 41.6 kg (91 lb 11.4 oz) ?IBW/kg (Calculated) : 50.1 ?TPN AdjBW (KG): 53.5 ?Body mass index is 16.77 kg/m?. ?Usual Weight: 55kg ? ?Assessment: [name] s/p Maria [name] of non-healing perforated peptic ulcer c/b prolonged ileus. GJ tube placed 3/19 and Pharmacy consulted for mgmt of TPN and electrolytes. ? ?Glucose / Insulin: BG previous 24h 116 - 135 ?--5 units SSI required ?Electrolytes: WNL ?Renal: Scr 2.63-->0.93 ?Hepatic: LFTs wnl ?Intake / Output; MIVF: no MIVF ?GI Imaging: ?3/19 CT abd -- Anterior antral gastric wall perforation with associated moderate volume pneumoperitoneum, small volume ascites, extravasated PO contrast. Colonic diverticulosis & Chronic pancreatitis. ? ?3/16 CT abd/pelvis:  Decreased pneumoperitoneum, Maria [name] patch repair of the perforated ?distal stomach, postoperative drain at the greater omentum. Small volume mostly simple density fluid in the abdomen is primarily around the liver. No bowel obstruction. Chronic calcific pancreatitis ? ?GI Surgeries / Procedures: 03/19 exploratory laparotomy ? ?Central access: 09/18/21 ?TPN start date: 09/18/21 ? ?Nutritional Goals: ?Goal TPN rate is 65 mL/hr (provides 90.1 g of protein and 1676.7 kcals per day) ? ?RD Assessment: ?Estimated Needs ?Total Energy Estimated Needs: 1600-1800kcal/day ?Total Protein Estimated Needs: 80-90g/day ?Total Fluid Estimated Needs: 1.6-1.8L/day ? ?Current Nutrition:  ?NPO ? ?Plan:  ?continue TPN at goal rate of 65 mL/hr at 1800 (total volume including overfill 1660 mL) ?Nutritional Components ?Amino acids (using 10% Travasol): 90.1 grams ?Dextrose: 249.6 grams ?Lipids (using 20% SMOFlipids): 46.8 grams ?Electrolytes in TPN: Na 39mq/L, K 464m/L, Ca 27m56mL, Mg 14m91m, and Phos 127mm427m. Cl:Ac 1:2 ?A small amount of sodium is being added back as of 03/24 ?Add standard MVI,  thiamine 100 mg and trace elements to TPN ?Continue Sensitive SSI to q6h and adjust as needed  ?Monitor TPN labs on Mon/Thurs when stable, daily until then ? ?Maria [name] A Maria [name] ?09/24/2021,11:41 AM ? ?

## 2021-09-24 NOTE — Progress Notes (Signed)
?Progress Note ? ? ?Patient: Maria [name] RAX:[id number] DOB: 03-12-1971 DOA: 09/13/2021     10 ?DOS: the patient was seen and examined on 09/24/2021 ?  ?Brief hospital course: ?Taken from prior notes. ? ?Meridian Scherger is a 51 y.o. female with PMH of HTN, pancreatitis, GERD/gastritis, anxiety, depression, alcohol abuse presented at [facility] ED with abdominal pain.  Patient was admitted on 09/13/2021 under general surgery due to pneumoperitoneum secondary to perforated peptic ulcer, patient was taken to the OR s/p ex lap with primary closure of perforated peptic ulcer with Phillip Heal patch repair with intra-abdominal abscess drainage.  Postop patient became unresponsive so code stroke was called but work-up was negative.  Patient was in the ICU, downgraded on the medical floor on 09/16/2021. ? ?Patient underwent another exploratory laparotomy yesterday on 09/17/2021 when peptic ulcer perforation repair was redone along with G and J-tube placement, intra-abdominal abscess was also drained. ? ?NG tube in place. ?Patient was started on TPN today, 09/18/2021 as surgery would like to give her a bowel rest for at least for 1 week. ? ?Foley catheter in place-surgery wants to keep for another 1 to 2 days. ?PICC line was placed on 09/18/2021 to start TPN ? ?3/21: Patient overall stable, labs with hypernatremia with sodium of 150.  She was not on any IV fluid except TPN, giving some D5. ?Surgery wants to give bowel rest for 1 week. ?Foley can be removed today. ? ?3/22: Overnight NG tube was accidentally fallen off and was replaced by nursing staff.  KUB with some air under the diaphragm, per surgery that is normal but they are recommending not to replace NG tube without notifying them first as she is prone to more perforation. ? ?3/23: No acute concern.  Patient remained on TPN.  Hopefully GI series earlier next week and if there is no leak then slowly start enteric feeding. ?IV hydralazine was made as schedule due to elevated blood  pressure. ? ?3/24: No concerns overnight.  Patient remained on TPN.  Pain improving.  Passing flatus and had a small bowel movement yesterday. ?Will remain n.p.o. till early next week-most likely GI series by Monday or Tuesday. ? ?3/25: Mild worsening of alkaline phosphatase and T. bili today.  Discussed with surgery and they are thinking might be some acalculus cholecystitis as there was some biliary sludge noted on prior CT abdomen.  No other specific instructions except continue to monitor. ?Downward trending hemoglobin, at 7.5 now.  Ordered 2 more unit of PRBC ? ?3/26: No acute concern today.  Hopefully will get her GI series early next week. ?Hemoglobin improved to 10.4 after getting 2 unit of PRBC yesterday. ?Pain manageable. ? ? ?Assessment and Plan: ?* Perforated peptic ulcer ([place]) ?With pneumoperitoneum and intra-abdominal abscesses s/p exploratory laparotomy twice as first time she was continued to have leak.  First surgery was on 09/13/2021 and second was on 09/17/2021. ?Slight increase in alkaline phosphatase and T. bili today. ?-Patient will remain n.p.o. for bowel rest for 7 days and started on TPN. ?-NG tube in place, it was replaced after accidental fall overnight. ?-Continue TPN ?-Monitor liver function ?-Continue with Zosyn and Diflucan-being managed by surgery. ?-Continue with pain management and supportive care ? ?Malnutrition of moderate degree ?Estimated body mass index is 16.77 kg/m? as calculated from the following: ?  Height as of this encounter: '5\' 2"'$  (1.575 m). ?  Weight as of this encounter: 41.6 kg.  ? ?-Patient is being started on TPN ? ?Essential hypertension ?Blood pressure remained  elevated .  Echocardiogram normal. ?-Make hydralazine as scheduled, 10 mg IV every 8 hourly. ?-Monitor blood pressure ? ?AKI (acute kidney injury) ([place]) ?Resolved.  Creatinine normalized ?-Monitor renal function ?-Avoid nephrotoxins ? ?Vitamin D deficiency ?Vitamin D level at 11.5. ?-We will start vitamin D  50,000 units p.o. weekly once able to take p.o. ? ?Alcohol abuse ?No withdrawal symptoms. ?-Continue with IV folic acid and thiamine.  We will switch to p.o. once able to take  Oral. ?-Continue with CIWA protocol ? ?Acute postoperative anemia due to expected blood loss ?Patient received 2 unit of PRBC when her hemoglobin dropped to 6.4 after second surgery.  She also received 1 unit of FFP due to INR of 1.7, followed by 2 more units of FFP by surgery. ?Due to slowly declining hemoglobin she received another 2 unit of PRBC.  Total of 4 unit given so far.  Hemoglobin improved to 10.4 this morning. ?Anemia panel consistent with anemia of chronic disease.  No deficiencies. ?-Monitor hemoglobin ?-Transfuse if below 7 ? ? ?Hypernatremia ?Resolved with D5. ?-Monitor sodium ? ?  ? ?Subjective: Patient was seen and examined today.  No new complaints.  She was resting comfortably. ? ?Physical Exam: ?Vitals:  ? 09/23/21 2114 09/24/21 0435 09/24/21 0758 09/24/21 1456  ?BP: (!) 141/70 127/62 133/62 129/61  ?Pulse: 76 88 90 85  ?Resp: '20 20 18 16  '$ ?Temp: 98.4 ?F (36.9 ?C) 99.1 ?F (37.3 ?C) 98.3 ?F (36.8 ?C) 98.2 ?F (36.8 ?C)  ?TempSrc: Oral Oral  Oral  ?SpO2: 98% 95% 96% 100%  ?Weight:      ?Height:      ? ?General.     In no acute distress.  NG tube in place ?Pulmonary.  Lungs clear bilaterally, normal respiratory effort. ?CV.  Regular rate and rhythm, no JVD, rub or murmur. ?Abdomen.  Soft, nontender, nondistended, BS positive. ?CNS.  Alert and oriented x3.  No focal neurologic deficit. ?Extremities.  No edema, no cyanosis, pulses intact and symmetrical. ?Psychiatry.  Judgment and insight appears normal. ? ?Data Reviewed: ?Prior notes and labs reviewed ? ?Family Communication:  ? ?Disposition: ?Status is: Inpatient ?Remains inpatient appropriate because: Severity of illness ? ? Planned Discharge Destination: Home ? ?Time spent: 42 minutes  ? ?This record has been created using Systems analyst. Errors have been  sought and corrected,but may not always be located. Such creation errors do not reflect on the standard of care. ? ?Author: ?Lorella Nimrod, MD ?09/24/2021 5:27 PM ? ?For on call review [email or web address].  ?

## 2021-09-24 NOTE — Plan of Care (Signed)
  Problem: Education: Goal: Knowledge of General Education information will improve Description: Including pain rating scale, medication(s)/side effects and non-pharmacologic comfort measures Outcome: Progressing   Problem: Activity: Goal: Risk for activity intolerance will decrease Outcome: Progressing   Problem: Skin Integrity: Goal: Risk for impaired skin integrity will decrease Outcome: Progressing   

## 2021-09-24 NOTE — Assessment & Plan Note (Signed)
Patient received 2 unit of PRBC when her hemoglobin dropped to 6.4 after second surgery.  She also received 1 unit of FFP due to INR of 1.7, followed by 2 more units of FFP by surgery. ?Due to slowly declining hemoglobin she received another 2 unit of PRBC.  Total of 4 unit given so far.  Hemoglobin improved to 10.4 this morning. ?Anemia panel consistent with anemia of chronic disease.  No deficiencies. ?-Monitor hemoglobin ?-Transfuse if below 7 ? ?

## 2021-09-25 ENCOUNTER — Encounter: Payer: Self-pay | Admitting: Surgery

## 2021-09-25 ENCOUNTER — Inpatient Hospital Stay: Payer: BC Managed Care – PPO

## 2021-09-25 DIAGNOSIS — K275 Chronic or unspecified peptic ulcer, site unspecified, with perforation: Secondary | ICD-10-CM | POA: Diagnosis not present

## 2021-09-25 LAB — CBC
HCT: 30.4 % — ABNORMAL LOW (ref 36.0–46.0)
Hemoglobin: 9.8 g/dL — ABNORMAL LOW (ref 12.0–15.0)
MCH: 31.6 pg (ref 26.0–34.0)
MCHC: 32.2 g/dL (ref 30.0–36.0)
MCV: 98.1 fL (ref 80.0–100.0)
Platelets: 121 10*3/uL — ABNORMAL LOW (ref 150–400)
RBC: 3.1 MIL/uL — ABNORMAL LOW (ref 3.87–5.11)
RDW: 17 % — ABNORMAL HIGH (ref 11.5–15.5)
WBC: 10.7 10*3/uL — ABNORMAL HIGH (ref 4.0–10.5)
nRBC: 0 % (ref 0.0–0.2)

## 2021-09-25 LAB — COMPREHENSIVE METABOLIC PANEL
ALT: 17 U/L (ref 0–44)
AST: 22 U/L (ref 15–41)
Albumin: 2.1 g/dL — ABNORMAL LOW (ref 3.5–5.0)
Alkaline Phosphatase: 209 U/L — ABNORMAL HIGH (ref 38–126)
Anion gap: 8 (ref 5–15)
BUN: 27 mg/dL — ABNORMAL HIGH (ref 6–20)
CO2: 22 mmol/L (ref 22–32)
Calcium: 8.7 mg/dL — ABNORMAL LOW (ref 8.9–10.3)
Chloride: 107 mmol/L (ref 98–111)
Creatinine, Ser: 0.83 mg/dL (ref 0.44–1.00)
GFR, Estimated: >60 mL/min (ref >60)
Glucose, Bld: 133 mg/dL — ABNORMAL HIGH (ref 70–99)
Potassium: 4.5 mmol/L (ref 3.5–5.1)
Sodium: 137 mmol/L (ref 135–145)
Total Bilirubin: 1.9 mg/dL — ABNORMAL HIGH (ref 0.3–1.2)
Total Protein: 6.4 g/dL — ABNORMAL LOW (ref 6.5–8.1)

## 2021-09-25 LAB — GLUCOSE, CAPILLARY
Glucose-Capillary: 112 mg/dL — ABNORMAL HIGH (ref 70–99)
Glucose-Capillary: 122 mg/dL — ABNORMAL HIGH (ref 70–99)
Glucose-Capillary: 124 mg/dL — ABNORMAL HIGH (ref 70–99)
Glucose-Capillary: 130 mg/dL — ABNORMAL HIGH (ref 70–99)
Glucose-Capillary: 132 mg/dL — ABNORMAL HIGH (ref 70–99)
Glucose-Capillary: 139 mg/dL — ABNORMAL HIGH (ref 70–99)

## 2021-09-25 LAB — BILIRUBIN, DIRECT: Bilirubin, Direct: 0.8 mg/dL — ABNORMAL HIGH (ref 0.0–0.2)

## 2021-09-25 LAB — PHOSPHORUS: Phosphorus: 3.5 mg/dL (ref 2.5–4.6)

## 2021-09-25 LAB — PROTIME-INR
INR: 1.2 (ref 0.8–1.2)
Prothrombin Time: 15.4 seconds — ABNORMAL HIGH (ref 11.4–15.2)

## 2021-09-25 LAB — TRIGLYCERIDES: Triglycerides: 94 mg/dL (ref <150)

## 2021-09-25 LAB — MAGNESIUM: Magnesium: 1.8 mg/dL (ref 1.7–2.4)

## 2021-09-25 MED ORDER — IOHEXOL 300 MG/ML  SOLN
100.0000 mL | Freq: Once | INTRAMUSCULAR | Status: AC | PRN
  Administered 2021-09-25: 100 mL via INTRAVENOUS

## 2021-09-25 MED ORDER — IOHEXOL 9 MG/ML PO SOLN
500.0000 mL | ORAL | Status: AC
  Administered 2021-09-25 (×2): 500 mL via ORAL

## 2021-09-25 MED ORDER — TRAVASOL 10 % IV SOLN
INTRAVENOUS | Status: AC
  Filled 2021-09-25: qty 900.1

## 2021-09-25 NOTE — Progress Notes (Signed)
Chaplain Maggie made follow up visitation with pt's mom in the hallway outside her room. Family anticipating next steps for her daughter and appreciated the support. Family open to prayer for favorable outcome. ?

## 2021-09-25 NOTE — Assessment & Plan Note (Signed)
Resolved.  Creatinine normalized ?-Monitor renal function ?-Avoid nephrotoxins ?

## 2021-09-25 NOTE — Progress Notes (Signed)
Mobility Specialist - Progress Note ? ? 09/25/21 1100  ?Mobility  ?Activity Ambulated with assistance to bathroom  ?Level of Assistance Standby assist, set-up cues, supervision of patient - no hands on  ?Assistive Device None  ?Distance Ambulated (ft) 12 ft  ?Activity Response Tolerated well  ?$Mobility charge 1 Mobility  ? ? ? ?Pt ambulated to bathroom with supervision. Does voice 8/10 pain in medial abdomen upon arrival, RN entered to administer pain medicine. Feels "loopy" after medication, but denied dizziness once upright. 6oz urinal out discarded. Pt returned to bed with alarm set, needs in reach.  ? ? ?Kathee Delton ?Mobility Specialist ?09/25/21, 11:24 AM ?  ? ? ? ?

## 2021-09-25 NOTE — Assessment & Plan Note (Signed)
Blood pressure now within goal after making hydralazine as scheduled.   Echocardiogram normal. ?-Continue with scheduled 10 mg of IV hydralazine every 8 hourly ?-Monitor blood pressure ?

## 2021-09-25 NOTE — Progress Notes (Signed)
PT Cancellation Note ? ?Patient Details ?Name: Maria [name] ?MRN: [id number] ?DOB: Apr 11, 1971 ? ? ?Cancelled Treatment:    Reason Eval/Treat Not Completed: Patient declined, no reason specified. Patient asks if I could return later today. Will check back as time allows.  ? ? ?Lasondra Hodgkins ?09/25/2021, 10:35 AM ?

## 2021-09-25 NOTE — Progress Notes (Signed)
?Progress Note ? ? ?Patient: Maria [name] YYQ:[id number] DOB: [birth year] DOA: 09/13/2021     11 ?DOS: the patient was seen and examined on 09/25/2021 ?  ?Brief hospital course: ?Taken from prior notes. ? ?Maria [name] is a 51 y.o. female with PMH of HTN, pancreatitis, GERD/gastritis, anxiety, depression, alcohol abuse presented at [facility] ED with abdominal pain.  Patient was admitted on 09/13/2021 under general surgery due to pneumoperitoneum secondary to perforated peptic ulcer, patient was taken to the OR s/p ex lap with primary closure of perforated peptic ulcer with Phillip Heal patch repair with intra-abdominal abscess drainage.  Postop patient became unresponsive so code stroke was called but work-up was negative.  Patient was in the ICU, downgraded on the medical floor on 09/16/2021. ? ?Patient underwent another exploratory laparotomy yesterday on 09/17/2021 when peptic ulcer perforation repair was redone along with G and J-tube placement, intra-abdominal abscess was also drained. ? ?NG tube in place. ?Patient was started on TPN today, 09/18/2021 as surgery would like to give her a bowel rest for at least for 1 week. ? ?Foley catheter in place-surgery wants to keep for another 1 to 2 days. ?PICC line was placed on 09/18/2021 to start TPN ? ?3/21: Patient overall stable, labs with hypernatremia with sodium of 150.  She was not on any IV fluid except TPN, giving some D5. ?Surgery wants to give bowel rest for 1 week. ?Foley can be removed today. ? ?3/22: Overnight NG tube was accidentally fallen off and was replaced by nursing staff.  KUB with some air under the diaphragm, per surgery that is normal but they are recommending not to replace NG tube without notifying them first as she is prone to more perforation. ? ?3/23: No acute concern.  Patient remained on TPN.  Hopefully GI series earlier next week and if there is no leak then slowly start enteric feeding. ?IV hydralazine was made as schedule due to elevated blood  pressure. ? ?3/24: No concerns overnight.  Patient remained on TPN.  Pain improving.  Passing flatus and had a small bowel movement yesterday. ?Will remain n.p.o. till early next week-most likely GI series by Monday or Tuesday. ? ?3/25: Mild worsening of alkaline phosphatase and T. bili today.  Discussed with surgery and they are thinking might be some acalculus cholecystitis as there was some biliary sludge noted on prior CT abdomen.  No other specific instructions except continue to monitor. ?Downward trending hemoglobin, at 7.5 now.  Ordered 2 more unit of PRBC ? ?3/26: No acute concern today.  Hopefully will get her GI series early next week. ?Hemoglobin improved to 10.4 after getting 2 unit of PRBC yesterday. ?Pain manageable. ? ?3/27: Alkaline phosphatase and T. bili slowly increasing, hemoglobin at 9.8, decreased from 10.4. ?Rest of the labs stable. ?Surgery ordered a repeat CT abdomen with oral contrast to rule out any further leak. ? ? ? ?Assessment and Plan: ?* Perforated peptic ulcer ([place]) ?With pneumoperitoneum and intra-abdominal abscesses s/p exploratory laparotomy twice as first time she was continued to have leak.  First surgery was on 09/13/2021 and second was on 09/17/2021. ?Slight increase in alkaline phosphatase and T. bili today. ?Surgery ordered a repeat CT abdomen with oral contrast today. ?-Patient will remain n.p.o. for bowel rest for 7 days and started on TPN. ?-NG tube in place. ?-Continue TPN ?-Monitor liver function ?-Continue with Zosyn and Diflucan-being managed by surgery, message sent to them for reevaluation as she is on antibiotics for more than 10 days now. ?-Continue with  pain management and supportive care ? ?Malnutrition of moderate degree ?Estimated body mass index is 16.77 kg/m? as calculated from the following: ?  Height as of this encounter: '5\' 2"'$  (1.575 m). ?  Weight as of this encounter: 41.6 kg.  ? ?-Patient is being started on TPN ? ?Essential hypertension ?Blood pressure  now within goal after making hydralazine as scheduled.   Echocardiogram normal. ?-Continue with scheduled 10 mg of IV hydralazine every 8 hourly ?-Monitor blood pressure ? ?AKI (acute kidney injury) ([place]) ?Resolved.  Creatinine normalized ?-Monitor renal function ?-Avoid nephrotoxins ? ?Vitamin D deficiency ?Vitamin D level at 11.5. ?-We will start vitamin D 50,000 units p.o. weekly once able to take p.o. ? ?Alcohol abuse ?No withdrawal symptoms. ?-Continue with IV folic acid and thiamine.  We will switch to p.o. once able to take  Oral. ?-Continue with CIWA protocol ? ?Acute postoperative anemia due to expected blood loss ?Patient received 2 unit of PRBC when her hemoglobin dropped to 6.4 after second surgery.  She also received 1 unit of FFP due to INR of 1.7, followed by 2 more units of FFP by surgery. ?Due to slowly declining hemoglobin she received another 2 unit of PRBC.  Total of 4 unit given so far.  Hemoglobin slowly decreasing again, at 9.8 today. ?Anemia panel consistent with anemia of chronic disease.  No deficiencies. ?-Monitor hemoglobin ?-Transfuse if below 7 ? ? ?Hypernatremia ?Resolved with D5. ?-Monitor sodium ? ?  ? ?Subjective: Patient was seen and examined today.  No new complaints.  Abdominal pain manageable.  Mother at bedside.  She was hoping to move forward today. ? ?Physical Exam: ?Vitals:  ? 09/24/21 1456 09/24/21 1940 09/25/21 0514 09/25/21 0525  ?BP: 129/61 (!) 124/59  (!) 127/57  ?Pulse: 85 83  93  ?Resp: '16 16  16  '$ ?Temp: 98.2 ?F (36.8 ?C) 98.1 ?F (36.7 ?C)  98.1 ?F (36.7 ?C)  ?TempSrc: Oral Oral  Oral  ?SpO2: 100% 99%  98%  ?Weight:   52.9 kg   ?Height:      ? ?General.     In no acute distress.  NG tube in place ?Pulmonary.  Lungs clear bilaterally, normal respiratory effort. ?CV.  Regular rate and rhythm, no JVD, rub or murmur. ?Abdomen.  Soft, nontender, nondistended, BS positive. ?CNS.  Alert and oriented x3.  No focal neurologic deficit. ?Extremities.  No edema, no cyanosis,  pulses intact and symmetrical. ?Psychiatry.  Judgment and insight appears normal. ? ?Data Reviewed: ?Prior notes and labs reviewed ? ?Family Communication: Discussed with mother at bedside. ? ?Disposition: ?Status is: Inpatient ?Remains inpatient appropriate because: Severity of illness ? ? Planned Discharge Destination: Home ? ?Time spent: 45 minutes ? ?This record has been created using Systems analyst. Errors have been sought and corrected,but may not always be located. Such creation errors do not reflect on the standard of care. ? ?Author: ?Lorella Nimrod, MD ?09/25/2021 2:53 PM ? ?For on call review [email or web address].  ?

## 2021-09-25 NOTE — Assessment & Plan Note (Addendum)
With pneumoperitoneum and intra-abdominal abscesses s/p exploratory laparotomy twice as first time she was continued to have leak.  First surgery was on 09/13/2021 and second was on 09/17/2021. ?Slight increase in alkaline phosphatase and T. bili today. ?Surgery ordered a repeat CT abdomen with oral contrast today. ?-Patient will remain n.p.o. for bowel rest for 7 days and started on TPN. ?-NG tube in place. ?-Continue TPN ?-Monitor liver function ?-Continue with Zosyn and Diflucan-being managed by surgery, message sent to them for reevaluation as she is on antibiotics for more than 10 days now. ?-Continue with pain management and supportive care ?

## 2021-09-25 NOTE — Progress Notes (Signed)
Subjective:  ?CC: ?Maria [name] is a 51 y.o. female  Hospital stay day 11, 8 Days Post-Op exploratory laparotomy, redo peptic ulcer perforation repair, G and J tube placement. ? ?HPI: ?No acute issues overnight reported.  No change in pain ? ?ROS:  ?General: Denies weight loss, weight gain, fatigue, fevers, chills, and night sweats. ?Heart: Denies chest pain, palpitations, racing heart, irregular heartbeat, leg pain or swelling, and decreased activity tolerance. ?Respiratory: Denies breathing difficulty, shortness of breath, wheezing, cough, and sputum. ?GI: Denies change in appetite, heartburn, nausea, vomiting, constipation, diarrhea, and blood in stool. ?GU: Denies difficulty urinating, pain with urinating, urgency, frequency, blood in urine. ? ? ?Objective:  ? ?Temp:  [98.1 ?F (36.7 ?C)] 98.1 ?F (36.7 ?C) (03/27 1533) ?Pulse Rate:  [83-93] 85 (03/27 1533) ?Resp:  [16] 16 (03/27 0525) ?BP: (124-134)/(57-61) 134/61 (03/27 1533) ?SpO2:  [97 %-99 %] 97 % (03/27 1533) ?Weight:  [52.9 kg] 52.9 kg (03/27 0514)     Height: '5\' 2"'$  (157.5 cm) Weight: 52.9 kg BMI (Calculated): 21.33  ? ?Intake/Output this shift:  ? ?Intake/Output Summary (Last 24 hours) at 09/25/2021 1550 ?Last data filed at 09/25/2021 1527 ?Gross per 24 hour  ?Intake 771.42 ml  ?Output 320 ml  ?Net 451.42 ml  ? ? ?Constitutional :  alert, cooperative, appears stated age, and no distress  ?Respiratory:  clear to auscultation bilaterally  ?Cardiovascular:  regular rate and rhythm  ?Gastrointestinal: Soft, some TTP around incision, no guarding. JP with 70m of serosanguinous output past 24hrs. NG with bilious output .  G and J tube remains clamped  ?Skin: Cool and moist. Staples c/d/i  ?Psychiatric: Normal affect, non-agitated, not confused  ?   ?  ?LABS:  ? ?  Latest Ref Rng & Units 09/25/2021  ?  4:57 AM 09/24/2021  ?  4:08 AM 09/23/2021  ?  5:03 AM  ?CMP  ?Glucose 70 - 99 mg/dL 133   135   116    ?BUN 6 - 20 mg/dL '27   24   27    '$ ?Creatinine 0.44 - 1.00 mg/dL  0.83   0.93   0.99    ?Sodium 135 - 145 mmol/L 137   137   138    ?Potassium 3.5 - 5.1 mmol/L 4.5   4.7   4.4    ?Chloride 98 - 111 mmol/L 107   108   107    ?CO2 22 - 32 mmol/L '22   22   23    '$ ?Calcium 8.9 - 10.3 mg/dL 8.7   8.7   8.4    ?Total Protein 6.5 - 8.1 g/dL 6.4    5.9    ?Total Bilirubin 0.3 - 1.2 mg/dL 1.9    1.6    ?Alkaline Phos 38 - 126 U/L 209    185    ?AST 15 - 41 U/L 22    26    ?ALT 0 - 44 U/L 17    20    ? ? ?  Latest Ref Rng & Units 09/25/2021  ?  4:57 AM 09/24/2021  ?  4:08 AM 09/23/2021  ? 11:45 PM  ?CBC  ?WBC 4.0 - 10.5 K/uL 10.7   11.0   12.8    ?Hemoglobin 12.0 - 15.0 g/dL 9.8   10.4   10.3    ?Hematocrit 36.0 - 46.0 % 30.4   32.2   31.6    ?Platelets 150 - 400 K/uL 121   122   123    ? ? ?  RADS: ? ?Assessment:  ? ?S/p exploratory laparotomy, redo peptic ulcer perforation repair, G and J tube placement, due to persistent leak after intial graham patch repair ? ? Vitals remain stable.  Hgb drop not as severe the past few days.  Leukocytosis actually improving as well, but CT shows persistent leak, with likely fascial dehiscence.  No change in physical exam since most recent CT.   ? ?Due to persistent leak, will continue with bowel rest, TPN, IV ABX and antifungal for intra-abdominal infection coverage. Will ask transfer to tertiary care center for more definitive repair, since additional surgical intervention beyond the scope of practice at [facility].  ? ?Swish and spit ok but strict NPO otherwise. ? ?labs/images/medications/previous chart entries reviewed personally and relevant changes/updates noted above. ? ? ?

## 2021-09-25 NOTE — Consult Note (Signed)
PHARMACY - TOTAL PARENTERAL NUTRITION CONSULT NOTE  ? ?Indication: Prolonged ileus ? ?Patient Measurements: ?Height: '5\' 2"'  (157.5 cm) ?Weight: 52.9 kg (116 lb 10 oz) ?IBW/kg (Calculated) : 50.1 ?TPN AdjBW (KG): 53.5 ?Body mass index is 21.33 kg/m?. ?Usual Weight: 55kg ? ?Assessment: [name] s/p Delford Field of non-healing perforated peptic ulcer c/b prolonged ileus. GJ tube placed 3/19 and Pharmacy consulted for mgmt of TPN and electrolytes. ? ?Glucose / Insulin: BG previous 24h 132-133 ?--4 units SSI required ?Electrolytes: WNL ?Renal: Scr <1, stable ?Hepatic: Alk Phos 209, T bili 1.9 ?Intake / Output; MIVF: no MIVF ?GI Imaging: ?3/19 CT abd: Anterior antral gastric wall perforation with associated moderate volume pneumoperitoneum, small volume ascites, extravasated PO contrast. Colonic diverticulosis & Chronic pancreatitis. ? ?3/16 CT abd/pelvis:  Decreased pneumoperitoneum, Phillip Heal patch repair of the perforated ?distal stomach, postoperative drain at the greater omentum. Small volume mostly simple density fluid in the abdomen is primarily around the liver. No bowel obstruction. Chronic calcific pancreatitis ? ?GI Surgeries / Procedures: 03/19 exploratory laparotomy ? ?Central access: 09/18/21 ?TPN start date: 09/18/21 ? ?Nutritional Goals: ?Goal TPN rate is 65 mL/hr (provides 90.1 g of protein and 1676.7 kcals per day) ? ?RD Assessment: ?Estimated Needs ?Total Energy Estimated Needs: 1600-1800kcal/day ?Total Protein Estimated Needs: 80-90g/day ?Total Fluid Estimated Needs: 1.6-1.8L/day ? ?Current Nutrition:  ?NPO ? ?Plan:  ?Continue TPN at goal rate of 65 mL/hr at 1800 (total volume including overfill 1660 mL) ?Nutritional Components ?Amino acids (using 10% Travasol): 90.1 grams ?Dextrose: 249.6 grams ?Lipids (using 20% SMOFlipids): 46.8 grams ?Electrolytes in TPN: Na 77mq/L, K 438m/L, Ca 47m33mL, Mg 25m77m, and Phos 147mm59m. Cl:Ac 1:2 ?A small amount of sodium is being added back as of 03/24 ?Add  standard MVI, thiamine 100 mg and trace elements to TPN ?Continue Sensitive SSI to q6h and adjust as needed  ?Monitor TPN labs on Mon/Thurs ? ?Lacy Sofia O Keri Tavella ?09/25/2021,7:25 AM ? ?

## 2021-09-25 NOTE — Assessment & Plan Note (Signed)
Patient received 2 unit of PRBC when her hemoglobin dropped to 6.4 after second surgery.  She also received 1 unit of FFP due to INR of 1.7, followed by 2 more units of FFP by surgery. ?Due to slowly declining hemoglobin she received another 2 unit of PRBC.  Total of 4 unit given so far.  Hemoglobin slowly decreasing again, at 9.8 today. ?Anemia panel consistent with anemia of chronic disease.  No deficiencies. ?-Monitor hemoglobin ?-Transfuse if below 7 ? ?

## 2021-09-26 ENCOUNTER — Encounter: Payer: Self-pay | Admitting: Surgery

## 2021-09-26 ENCOUNTER — Inpatient Hospital Stay: Payer: BC Managed Care – PPO

## 2021-09-26 DIAGNOSIS — K275 Chronic or unspecified peptic ulcer, site unspecified, with perforation: Secondary | ICD-10-CM | POA: Diagnosis not present

## 2021-09-26 LAB — GLUCOSE, CAPILLARY
Glucose-Capillary: 113 mg/dL — ABNORMAL HIGH (ref 70–99)
Glucose-Capillary: 119 mg/dL — ABNORMAL HIGH (ref 70–99)
Glucose-Capillary: 128 mg/dL — ABNORMAL HIGH (ref 70–99)
Glucose-Capillary: 137 mg/dL — ABNORMAL HIGH (ref 70–99)
Glucose-Capillary: 139 mg/dL — ABNORMAL HIGH (ref 70–99)

## 2021-09-26 LAB — PROTIME-INR
INR: 1.2 (ref 0.8–1.2)
Prothrombin Time: 15.2 seconds (ref 11.4–15.2)

## 2021-09-26 MED ORDER — MIDAZOLAM HCL 2 MG/2ML IJ SOLN
INTRAMUSCULAR | Status: AC
  Filled 2021-09-26: qty 2

## 2021-09-26 MED ORDER — FENTANYL CITRATE (PF) 100 MCG/2ML IJ SOLN
INTRAMUSCULAR | Status: AC | PRN
  Administered 2021-09-26 (×2): 50 ug via INTRAVENOUS

## 2021-09-26 MED ORDER — MIDAZOLAM HCL 2 MG/2ML IJ SOLN
INTRAMUSCULAR | Status: AC | PRN
Start: 2021-09-26 — End: 2021-09-26
  Administered 2021-09-26: 1 mg via INTRAVENOUS

## 2021-09-26 MED ORDER — SODIUM CHLORIDE 0.9% FLUSH
5.0000 mL | Freq: Three times a day (TID) | INTRAVENOUS | Status: DC
  Administered 2021-09-26 – 2021-10-02 (×14): 5 mL

## 2021-09-26 MED ORDER — MIDAZOLAM HCL 5 MG/5ML IJ SOLN
INTRAMUSCULAR | Status: AC | PRN
Start: 2021-09-26 — End: 2021-09-26
  Administered 2021-09-26: 1 mg via INTRAVENOUS

## 2021-09-26 MED ORDER — HYDROMORPHONE HCL 1 MG/ML IJ SOLN
0.5000 mg | INTRAMUSCULAR | Status: DC | PRN
  Administered 2021-09-26 – 2021-10-08 (×52): 0.5 mg via INTRAVENOUS
  Filled 2021-09-26 (×54): qty 0.5

## 2021-09-26 MED ORDER — FENTANYL CITRATE (PF) 100 MCG/2ML IJ SOLN
INTRAMUSCULAR | Status: AC
  Filled 2021-09-26: qty 2

## 2021-09-26 MED ORDER — TRAVASOL 10 % IV SOLN
INTRAVENOUS | Status: AC
  Filled 2021-09-26: qty 900.1

## 2021-09-26 NOTE — Progress Notes (Signed)
Occupational Therapy Treatment ?Patient Details ?Name: Maria [name] ?MRN: [id number] ?DOB: December 27, 1970 ?Today's Date: 09/26/2021 ? ? ?History of present illness Pt is a 51 year old female admitted on on 09/13/2021 under general surgery due to pneumoperitoneum secondary to perforated peptic ulcer, patient was taken to the OR s/p ex lap with primary closure of perforated peptic ulcer with Phillip Heal patch repair with intra-abdominal abscess drainage.  Postop patient became unresponsive so code stroke was called but work-up was negative. Patient underwent another exploratory laparotomy on 09/17/2021 when peptic ulcer perforation repair was redone along with G and J-tube placement, intra-abdominal abscess was also drained. TPN started 09/18/21. ?  ?OT comments ? Pt seen for OT treatment on this date. Upon arrival to room, pt awake and seated upright in bed following drain placement (RN approving for light OT tx). Pt off suction for OT tx. Pt currently presents with decreased strength and activity tolerance. Due to these functional impairments, pt requires SUPERVISION to don/doff socks at bed-level, MIN GUARD for bed mobility, MIN A for sit>stand transfers, and intermittent MIN A for dynamic standing balance while engaging in IADL tasks of bed linen change. Pt is making good progress toward goals and continues to benefit from skilled OT services to maximize return to PLOF and minimize risk of future falls, injury, caregiver burden, and readmission. Will continue to follow POC. Discharge recommendation updated to [facility] in setting of prolonged hospitalization and decreased strength/balance compared to baseline.   ? ?Recommendations for follow up therapy are one component of a multi-disciplinary discharge planning process, led by the attending physician.  Recommendations may be updated based on patient status, additional functional criteria and insurance authorization. ?   ?Follow Up Recommendations ? Home health OT  ?  ?Assistance  Recommended at Discharge Intermittent Supervision/Assistance  ?Patient can return home with the following ? A little help with walking and/or transfers;A little help with bathing/dressing/bathroom;Assistance with cooking/housework ?  ?Equipment Recommendations ? None recommended by OT  ?  ?   ?Precautions / Restrictions Precautions ?Precautions: Fall ?Precaution Comments: ng tube, jp drain ?Restrictions ?Weight Bearing Restrictions: No  ? ? ?  ? ?Mobility Bed Mobility ?Overal bed mobility: Needs Assistance ?Bed Mobility: Supine to Sit ?  ?  ?Supine to sit: HOB elevated, Min guard ?  ?  ?General bed mobility comments: While implementing log roll technique ?  ? ?Transfers ?Overall transfer level: Needs assistance ?Equipment used: 1 person hand held assist ?Transfers: Sit to/from Stand ?Sit to Stand: Min assist ?  ?  ?  ?  ?  ?General transfer comment: needs slight assistance with standing with postior lean. No dizziness noted with upright posture ?  ?  ?Balance Overall balance assessment: Needs assistance ?Sitting-balance support: No upper extremity supported, Feet supported ?Sitting balance-Leahy Scale: Normal ?  ?  ?Standing balance support: Single extremity supported, During functional activity ?Standing balance-Leahy Scale: Poor ?Standing balance comment: Requires intermittent MIN A for standing balance ?  ?  ?  ?  ?  ?  ?  ?  ?  ?  ?  ?   ? ?ADL either performed or assessed with clinical judgement  ? ?ADL Overall ADL's : Needs assistance/impaired ?Eating/Feeding: NPO ?Eating/Feeding Details (indicate cue type and reason): TPN at this time ?  ?  ?  ?  ?  ?  ?  ?  ?Lower Body Dressing: Supervision/safety;Bed level ?Lower Body Dressing Details (indicate cue type and reason): to don/doff socks ?  ?  ?  ?  ?  ?  ?  ?  ?  ? ? ? ?  Cognition Arousal/Alertness: Awake/alert ?Behavior During Therapy: [facility] for tasks assessed/performed ?Overall Cognitive Status: Within Functional Limits for tasks assessed ?  ?  ?  ?  ?  ?  ?  ?   ?  ?  ?  ?  ?  ?  ?  ?  ?  ?  ?  ?   ?Exercises Other Exercises ?Other Exercises: Pt engaged in IADL task of household management; able to stand while removing soiled chuck and applying clean chuck ? ?  ?   ?   ? ? ?Pertinent Vitals/ Pain       Pain Assessment ?Pain Assessment: Faces ?Faces Pain Scale: Hurts little more ?Pain Location: abdomen ?Pain Descriptors / Indicators: Discomfort ?Pain Intervention(s): Limited activity within patient's tolerance, Repositioned, Monitored during session ? ?   ?   ? ?Frequency ? Min 2X/week  ? ? ? ? ?  ?Progress Toward Goals ? ?OT Goals(current goals can now be found in the care plan section) ? Progress towards OT goals: Progressing toward goals ? ?Acute Rehab OT Goals ?Patient Stated Goal: to get stronger ?OT Goal Formulation: With patient ?Time For Goal Achievement: 10/06/21 ?Potential to Achieve Goals: Good  ?Plan Discharge plan needs to be updated;Frequency remains appropriate   ? ?   ?AM-PAC OT "6 Clicks" Daily Activity     ?Outcome Measure ? ? Help from another person eating meals?: Total ?Help from another person taking care of personal grooming?: None ?Help from another person toileting, which includes using toliet, bedpan, or urinal?: A Little ?Help from another person bathing (including washing, rinsing, drying)?: A Little ?Help from another person to put on and taking off regular upper body clothing?: None ?Help from another person to put on and taking off regular lower body clothing?: A Little ?6 Click Score: 18 ? ?  ?End of Session   ? ?OT Visit Diagnosis: Unsteadiness on feet (R26.81);Muscle weakness (generalized) (M62.81) ?  ?Activity Tolerance Patient tolerated treatment well ?  ?Patient Left in bed;with call bell/phone within reach;with bed alarm set;with family/visitor present ?  ?Nurse Communication Mobility status ?  ? ?   ? ?Time: 5498-2641 ?OT Time Calculation (min): 23 min ? ?Charges: OT General Charges ?$OT Visit: 1 Visit ?OT Treatments ?$Self Care/Home  Management : 23-37 mins ? ?Maria Maudlin, OTR/L ?[place] ? ?

## 2021-09-26 NOTE — Consult Note (Signed)
PHARMACY - TOTAL PARENTERAL NUTRITION CONSULT NOTE  ? ?Indication: Prolonged ileus ? ?Patient Measurements: ?Height: '5\' 2"'  (157.5 cm) ?Weight: 52.9 kg (116 lb 10 oz) ?IBW/kg (Calculated) : 50.1 ?TPN AdjBW (KG): 53.5 ?Body mass index is 21.33 kg/m?. ?Usual Weight: 55kg ? ?Assessment: [name] s/p Delford Field of non-healing perforated peptic ulcer c/b prolonged ileus. GJ tube placed 3/19 and Pharmacy consulted for mgmt of TPN and electrolytes. ? ?Glucose / Insulin: BG previous 24h 112-139 ?--3 units SSI required ?Electrolytes: WNL ?Renal: Scr <1, stable ?Hepatic: Alk Phos 209, T bili 1.9 ?Intake / Output; MIVF: no MIVF ?GI Imaging: ?3/19 CT abd: Anterior antral gastric wall perforation with associated moderate volume pneumoperitoneum, small volume ascites, extravasated PO contrast. Colonic diverticulosis & Chronic pancreatitis. ? ?3/16 CT abd/pelvis:  Decreased pneumoperitoneum, Phillip Heal patch repair of the perforated ?distal stomach, postoperative drain at the greater omentum. Small volume mostly simple density fluid in the abdomen is primarily around the liver. No bowel obstruction. Chronic calcific pancreatitis ? ?GI Surgeries / Procedures: 03/19 exploratory laparotomy ? ?Central access: 09/18/21 ?TPN start date: 09/18/21 ? ?Nutritional Goals: ?Goal TPN rate is 65 mL/hr (provides 90.1 g of protein and 1676.7 kcals per day) ? ?RD Assessment: ?Estimated Needs ?Total Energy Estimated Needs: 1600-1800kcal/day ?Total Protein Estimated Needs: 80-90g/day ?Total Fluid Estimated Needs: 1.6-1.8L/day ? ?Current Nutrition:  ?NPO ? ?Plan:  ?Continue TPN at goal rate of 65 mL/hr at 1800 (total volume including overfill 1660 mL) ?Nutritional Components ?Amino acids (using 10% Travasol): 90.1 grams ?Dextrose: 249.6 grams ?Lipids (using 20% SMOFlipids): 46.8 grams ?Electrolytes in TPN: Na 80mq/L, K 426m/L, Ca 69m31mL, Mg 26m40m, and Phos 169mm37m. Cl:Ac 1:2 ?A small amount of sodium added back as of 03/24 ?Add standard MVI,  thiamine 100 mg and trace elements to TPN ?Continue Sensitive SSI to q6h and adjust as needed  ?Monitor TPN labs on Mon/Thurs ? ?Worth Kober O Saylee Sherrill ?09/26/2021,7:33 AM ? ?

## 2021-09-26 NOTE — Consult Note (Signed)
? ?Chief Complaint: ?Patient was seen in consultation today for perihepatic fluid collection at the request of [name], DO ? ?Referring Physician(s): ?[name], DO ? ?Supervising Physician: Aletta Edouard ? ?Patient Status: Maria [name] - In-pt ? ?History of Present Illness: ?Maria [name] is a 51 y.o. female who presented to ED 3/15 for complaints of abdominal pain, weakness and abnormal lab values. The patient underwent CT imaging and found to have perforated ulcer of the gastric antrum with pneumoperitoneum. Patient is s/p graham patch repair 3/15 with persistent leak seen and s/p repeat ulcer graham patch repair and G and J tube placement 3/19. CT done 3/27 with findings of perihepatic fluid collection and request received for IR evaluation with drain placement.  ? ?The patient states she is doing okay, no significant change in her abdominal discomfort more so weak and feeling mentally tired. She denies any current chest pain or shortness of breath. She has no known complications to versed and fentanyl sedation.  ? ? ?Past Medical History:  ?Diagnosis Date  ? Anemia   ? Anesthesia complication, initial encounter 09/13/2021  ? Difficulty waking up from Anesthesia requiring re-intubation  ? Anxiety   ? h/o  ? Depression   ? h/o  ? Family history of adverse reaction to anesthesia   ? mom-hard time waking up  ? Folate deficiency 07/30/2019  ? Gastritis 08/19/2020  ? GERD (gastroesophageal reflux disease)   ? tums prn  ? Hypertension   ? WAS PUT ON BP MED BY PCP LAST YEAR (2018) AND BP MED MADE PT SICK SO SHE STOPPED TAKING IT-PCP MONITORS BP NOW  ? Palpitations 05/18/2020  ? Pancreatitis   ? Poison ivy dermatitis 11/13/2019  ? Vitamin B12 deficiency 01/17/2018  ? 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  ? ? ?Past Surgical History:  ?Procedure Laterality Date  ? ANTERIOR AND POSTERIOR REPAIR WITH SACROSPINOUS FIXATION N/A 03/13/2018  ? Procedure: ANTERIOR REPAIR;  Surgeon: [name], MD;  Location: [facility] ORS;   Service: Gynecology;  Laterality: N/A;  ? INDUCED ABORTION    ? x2  ? LAPAROTOMY N/A 09/13/2021  ? Procedure: EXPLORATORY LAPAROTOMY-Graham PATCH repair;  Surgeon: [name], DO;  Location: [facility] ORS;  Service: General;  Laterality: N/A;  ? LAPAROTOMY N/A 09/17/2021  ? Procedure: EXPLORATORY LAPAROTOMY;  Surgeon: [name], MD;  Location: [facility] OR[facility];  Service: General;  Laterality: N/A;  ? REPAIR OF PERFORATED ULCER N/A 09/17/2021  ? Procedure: REPAIR OF PERFORATED ULCER;  Surgeon: [name], MD;  Location: [facility] ORS;  Service: General;  Laterality: N/A;  ? VAGINAL HYSTERECTOMY N/A 03/13/2018  ? Procedure: HYSTERECTOMY VAGINAL;  Surgeon: [name], MD;  Location: [facility] ORS;  Service: Gynecology;  Laterality: N/A;  ? ? ?Allergies: ?Anesthesia s-i-40 [propofol], Bee venom, Codeine, and Bupropion ? ?Medications: ?Prior to Admission medications   ?Medication Sig Start Date End Date Taking? Authorizing Provider  ?meloxicam (MOBIC) 15 MG tablet Take 15 mg by mouth daily. 08/30/21  Yes [provider]  ?omeprazole (PRILOSEC) 20 MG capsule Take 1 capsule (20 mg total) by mouth daily. 09/06/21  Yes [name], FNP  ?ondansetron (ZOFRAN) 4 MG tablet Take 1 tablet (4 mg total) by mouth every 8 (eight) hours as needed for nausea or vomiting. 09/06/21  Yes [name], FNP  ?cyanocobalamin ([employer] VITAMIN B12) 2000 MCG tablet Take 1 tablet (2,000 mcg total) by mouth daily. ?Patient not taking: Reported on 09/14/2021 02/02/21   [name], MD  ?dicyclomine (BENTYL) 10 MG capsule Take 1  capsule (10 mg total) by mouth 4 (four) times daily -  before meals and at bedtime for 3 days. 09/07/21 09/10/21  [name], MD  ?EPINEPHrine 0.3 mg/0.3 mL IJ SOAJ injection Inject 0.3 mLs (0.3 mg total) into the muscle as needed for anaphylaxis. 11/11/18   [name], NP  ?fluticasone (FLONASE) 50 MCG/ACT nasal spray Place 2 sprays into both nostrils daily. ?Patient not taking: Reported on 09/14/2021 07/20/21   [name], MD  ?folic acid (FOLVITE) 1 MG tablet Take 1 tablet (1 mg total) by mouth daily. ?Patient not taking: Reported on 09/14/2021 02/02/21   [name], MD  ?hydrOXYzine (ATARAX/VISTARIL) 10 MG tablet Take 1-2 tablets (10-20 mg total) by mouth 3 (three) times daily as needed for itching. ?Patient not taking: Reported on 09/14/2021 11/13/19   [name], NP  ?Multiple Vitamin (MULTIVITAMIN) tablet Take 1 tablet by mouth daily. ?Patient not taking: Reported on 09/14/2021    [provider]  ?thiamine (VITAMIN B-1) 50 MG tablet Take 1 tablet (50 mg total) by mouth daily. ?Patient not taking: Reported on 09/14/2021 05/29/21   [name], MD  ?  ? ?Family History  ?Problem Relation Age of Onset  ? Arthritis Mother   ? Arthritis Father   ? Alcohol abuse Father   ? Hypertension Father   ? Heart disease Father   ? Aneurysm Father   ? Breast cancer Maternal Grandmother   ? Breast cancer Paternal Grandmother   ? ? ?Social History  ? ?Socioeconomic History  ? Marital status: Married  ?  Spouse name: Not on file  ? Number of children: Not on file  ? Years of education: Not on file  ? Highest education level: Not on file  ?Occupational History  ? Not on file  ?Tobacco Use  ? Smoking status: Every Day  ?  Packs/day: 1.00  ?  Years: 20.00  ?  Pack years: 20.00  ?  Types: Cigarettes  ? Smokeless tobacco: Never  ?Vaping Use  ? Vaping Use: Never used  ?Substance and Sexual Activity  ? Alcohol use: Yes  ?  Alcohol/week: 2.0 standard drinks  ?  Types: 2 Glasses of wine per week  ?  Comment: two glasses wine/beer every other day  ? Drug use: No  ? Sexual activity: Yes  ?Other Topics Concern  ? Not on file  ?Social History Narrative  ? Married.  ? 1 child.   ? Working at [employer] at [employer].    ? Enjoys swimming, camping  ? ?Social Determinants of Health  ? ?Financial Resource Strain: Not on file  ?Food Insecurity: Not on file  ?Transportation Needs: Not on file  ?Physical Activity: Not on file  ?Stress: Not on file   ?Social Connections: Not on file  ? ?Review of Systems: A 12 point ROS discussed and pertinent positives are indicated in the HPI above.  All other systems are negative. ? ?Review of Systems ? ?Vital Signs: ?BP (!) 117/57 (BP Location: Left Arm)   Pulse 95   Temp 98.3 ?F (36.8 ?C) (Oral)   Resp 18   Ht '5\' 2"'$  (1.575 m)   Wt 116 lb 10 oz (52.9 kg)   LMP 02/24/2018 (Approximate) Comment: spotting   SpO2 97%   BMI 21.33 kg/m?  ? ?Physical Exam ?Constitutional:   ?   Appearance: Normal appearance.  ?HENT:  ?   Head: Normocephalic and atraumatic.  ?Cardiovascular:  ?   Rate and Rhythm: Normal  rate and regular rhythm.  ?Pulmonary:  ?   Effort: Pulmonary effort is normal. No respiratory distress.  ?Abdominal:  ?   Palpations: Abdomen is soft.  ?   Comments: Surgical incision intact with surgical drains in place  ?Neurological:  ?   Mental Status: She is alert.  ? ? ?Imaging: ?CT ABDOMEN PELVIS WO CONTRAST ? ?Result Date: 09/13/2021 ?CLINICAL DATA:  Acute abdominal pain EXAM: CT ABDOMEN AND PELVIS WITHOUT CONTRAST TECHNIQUE: Multidetector CT imaging of the abdomen and pelvis was performed following the standard protocol without IV contrast. RADIATION DOSE REDUCTION: This exam was performed according to the departmental dose-optimization program which includes automated exposure control, adjustment of the mA and/or kV according to patient size and/or use of iterative reconstruction technique. COMPARISON:  09/07/2021 FINDINGS: Lower chest: Mild atelectasis along the right hemidiaphragm. Hepatobiliary: Prominent lateral segment left hepatic lobe with morphology suspicious for hepatic cirrhosis. High density in the gallbladder favoring vicarious excretion of prior contrast administration. Pancreas: Dilated dorsal pancreatic duct with punctate calcifications along the pancreatic head compatible with chronic calcific pancreatitis. Spleen: The spleen measures 11.3 by 7.1 by 13.2 cm (volume = 550 cm^3), compatible with  splenomegaly. Adrenals/Urinary Tract: Unremarkable Stomach/Bowel: There is a large perforated ulcer of the anterior gastric antrum in direct communication with free intraperitoneal gas on image 41 of series 2. There

## 2021-09-26 NOTE — Progress Notes (Signed)
Nutrition Follow-up ? ?DOCUMENTATION CODES:  ? ?Non-severe (moderate) malnutrition in context of social or environmental circumstances ? ?INTERVENTION:  ? ?Continue TPN per pharmacy- currently at goal rate  ?  ?Daily weights  ?  ?Once appropriate for tube feeds, recommend: ?  ?Osmolite 1.5'@50ml' /hr- Initiate at 102m/hr and increase by 198mhr q 8 hours until goal rate is reached.  ?  ?Pro-Source 4585maily via tube, provides 40kcal and 11g of protein per serving  ?  ?Free water flushes 85m39m hours to maintain tube patency  ?  ?Regimen provides 1840kcal/day, 86g/day protein and 1094ml102m of free water  ?  ?Supplement vitamin D once pt can resume enteral nutrition  ? ?NUTRITION DIAGNOSIS:  ? 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 ? ?Ongoing ? ?GOAL:  ? ?Patient will meet greater than or equal to 90% of their needs ? ?Met with TPN ? ?MONITOR:  ? ?Diet advancement, Labs, Weight trends, Skin, I & O's ? ?REASON FOR ASSESSMENT:  ? ?Malnutrition Screening Tool ?  ? ?ASSESSMENT:  ? ?51 y/45female with h/o GERD, anxiety, depression, etoh abuse and HTN who is admitted with perforated duodenal ulcer s/p Graham patch repair 3/15. ? ?3/27- CT shows persistent leak ?3/28- s/p drain for perihepatic abscess ? ?Reviewed I/O's: +227 ml x 24 hours and +16.8 L since admission ? ?UOP: 900 ml x 24 hours ? ?NGT output: 100 ml x 24 hours ? ?Perihepatic abscess drain output: 10 ml x 24 hours ? ?Per MD notes, plan for continued bowel rest for another week (NPO, TPN, and IV antibiotics) secondary to continued leak. [facility]; [facility] recommends continuing further management.  ? ?Pt remains on TPN at goal rate at 65 ml/hr, which provides 1676 kcals and 90 grams protein, meeting 100% of needs.  ? ?Labs reviewed: CBGS: 113-139  (inpatient orders for glycemic control are 0-9 units insulin aspart every 6 hours).   ? ?Diet Order:   ?Diet Order   ? ?       ?  Diet NPO time specified  Diet effective now       ?  ? ?  ?  ? ?  ? ? ?EDUCATION NEEDS:  ? ?No education needs have been identified at this time ? ?Skin:  Skin Assessment: Skin Integrity Issues: ?Skin Integrity Issues:: Incisions ?Incisions: closed abdomen ? ?Last BM:  09/24/21 ? ?Height:  ? ?Ht Readings from Last 1 Encounters:  ?09/14/21 '5\' 2"'  (1.575 m)  ? ? ?Weight:  ? ?Wt Readings from Last 1 Encounters:  ?09/25/21 52.9 kg  ? ? ?Ideal Body Weight:  50 kg ? ?BMI:  Body mass index is 21.33 kg/m?. ? ?Estimated Nutritional Needs:  ? ?Kcal:  1600-1800kcal/day ? ?Protein:  80-90g/day ? ?Fluid:  1.6-1.8L/day ? ? ? ?JenifLoistine Chance LDN, CDCES ?Registered Dietitian II ?Certified Diabetes Care and Education Specialist ?Please refer to [facility] and/or RD on-call/weekend/after hours pager  ?

## 2021-09-26 NOTE — Progress Notes (Signed)
?Progress Note ? ? ?Patient: Maria [name] ONG:[id number] DOB: 03-Sep-1970 DOA: 09/13/2021     12 ?DOS: the patient was seen and examined on 09/26/2021 ?  ?Brief hospital course: ?Taken from prior notes. ? ?Maria [name] is a 51 y.o. female with PMH of HTN, pancreatitis, GERD/gastritis, anxiety, depression, alcohol abuse presented at [facility] ED with abdominal pain.  Patient was admitted on 09/13/2021 under general surgery due to pneumoperitoneum secondary to perforated peptic ulcer, patient was taken to the OR s/p ex lap with primary closure of perforated peptic ulcer with Phillip Heal patch repair with intra-abdominal abscess drainage.  Postop patient became unresponsive so code stroke was called but work-up was negative.  Patient was in the ICU, downgraded on the medical floor on 09/16/2021. ? ?Patient underwent another exploratory laparotomy yesterday on 09/17/2021 when peptic ulcer perforation repair was redone along with G and J-tube placement, intra-abdominal abscess was also drained. ? ?NG tube in place. ?Patient was started on TPN today, 09/18/2021 as surgery would like to give her a bowel rest for at least for 1 week. ? ?Foley catheter in place-surgery wants to keep for another 1 to 2 days. ?PICC line was placed on 09/18/2021 to start TPN ? ?3/21: Patient overall stable, labs with hypernatremia with sodium of 150.  She was not on any IV fluid except TPN, giving some D5. ?Surgery wants to give bowel rest for 1 week. ?Foley can be removed today. ? ?3/22: Overnight NG tube was accidentally fallen off and was replaced by nursing staff.  KUB with some air under the diaphragm, per surgery that is normal but they are recommending not to replace NG tube without notifying them first as she is prone to more perforation. ? ?3/23: No acute concern.  Patient remained on TPN.  Hopefully GI series earlier next week and if there is no leak then slowly start enteric feeding. ?IV hydralazine was made as schedule due to elevated blood  pressure. ? ?3/24: No concerns overnight.  Patient remained on TPN.  Pain improving.  Passing flatus and had a small bowel movement yesterday. ?Will remain n.p.o. till early next week-most likely GI series by Monday or Tuesday. ? ?3/25: Mild worsening of alkaline phosphatase and T. bili today.  Discussed with surgery and they are thinking might be some acalculus cholecystitis as there was some biliary sludge noted on prior CT abdomen.  No other specific instructions except continue to monitor. ?Downward trending hemoglobin, at 7.5 now.  Ordered 2 more unit of PRBC ? ?3/26: No acute concern today.  Hopefully will get her GI series early next week. ?Hemoglobin improved to 10.4 after getting 2 unit of PRBC yesterday. ?Pain manageable. ? ?3/27: Alkaline phosphatase and T. bili slowly increasing, hemoglobin at 9.8, decreased from 10.4. ?Rest of the labs stable. ?Surgery ordered a repeat CT abdomen with oral contrast to rule out any further leak. ? ?3/28: Repeat CT abdomen with a small persistent leak, there was also concern of subcapsular hematoma.  Surgery is trying to transfer her to a tertiary care center.  They also consulted IR for drain placement. ?Patient remained stable from medical standpoint.  Surgery offered to take over and [facility] service will signed off. ? ? ? ?Assessment and Plan: ?* Perforated peptic ulcer ([place]) ?With pneumoperitoneum and intra-abdominal abscesses s/p exploratory laparotomy twice as first time she was continued to have leak.  First surgery was on 09/13/2021 and second was on 09/17/2021. ?Slight increase in alkaline phosphatase and T. bili today. ?Surgery ordered a repeat CT  abdomen with oral contrast today. ?-Patient will remain n.p.o. for bowel rest for 7 days and started on TPN. ?-NG tube in place. ?-Continue TPN ?-Monitor liver function ?-Continue with Zosyn and Diflucan-being managed by surgery, message sent to them for reevaluation as she is on antibiotics for more than 10 days  now. ?-Continue with pain management and supportive care ? ?Malnutrition of moderate degree ?Estimated body mass index is 16.77 kg/m? as calculated from the following: ?  Height as of this encounter: '5\' 2"'$  (1.575 m). ?  Weight as of this encounter: 41.6 kg.  ? ?-Patient is being started on TPN ? ?Essential hypertension ?Blood pressure now within goal after making hydralazine as scheduled.   Echocardiogram normal. ?-Continue with scheduled 10 mg of IV hydralazine every 8 hourly ?-Monitor blood pressure ? ?AKI (acute kidney injury) ([place]) ?Resolved.  Creatinine normalized ?-Monitor renal function ?-Avoid nephrotoxins ? ?Vitamin D deficiency ?Vitamin D level at 11.5. ?-We will start vitamin D 50,000 units p.o. weekly once able to take p.o. ? ?Alcohol abuse ?No withdrawal symptoms. ?-Continue with IV folic acid and thiamine.  We will switch to p.o. once able to take  Oral. ?-Continue with CIWA protocol ? ?Acute postoperative anemia due to expected blood loss ?Patient received 2 unit of PRBC when her hemoglobin dropped to 6.4 after second surgery.  She also received 1 unit of FFP due to INR of 1.7, followed by 2 more units of FFP by surgery. ?Due to slowly declining hemoglobin she received another 2 unit of PRBC.  Total of 4 unit given so far.  Hemoglobin slowly decreasing again, at 9.8 today. ?Anemia panel consistent with anemia of chronic disease.  No deficiencies. ?-Monitor hemoglobin ?-Transfuse if below 7 ? ? ?Hypernatremia ?Resolved with D5. ?-Monitor sodium ? ?  ? ?Subjective: Patient with no new complaints.  Son and mother at bedside. ? ?Physical Exam: ?Vitals:  ? 09/26/21 1425 09/26/21 1430 09/26/21 1435 09/26/21 1440  ?BP: 131/71 130/68 131/69 130/74  ?Pulse: 88 87 85 86  ?Resp: '16 16 18 16  '$ ?Temp:      ?TempSrc:      ?SpO2: 100% 100% 100% 100%  ?Weight:      ?Height:      ? ?General.     In no acute distress.  NG tube in place ?Pulmonary.  Lungs clear bilaterally, normal respiratory effort. ?CV.  Regular rate  and rhythm, no JVD, rub or murmur. ?Abdomen.  Soft, nontender, nondistended, BS positive. ?CNS.  Alert and oriented x3.  No focal neurologic deficit. ?Extremities.  No edema, no cyanosis, pulses intact and symmetrical. ?Psychiatry.  Judgment and insight appears normal. ? ?Data Reviewed: ?Prior notes and labs reviewed ? ?Family Communication: Discussed with mother and son at bedside. ? ?Disposition: ?Status is: Inpatient ?Remains inpatient appropriate because: Severity of illness ? ? Planned Discharge Destination: Home ? ?Time spent: 42 minutes ? ?This record has been created using Systems analyst. Errors have been sought and corrected,but may not always be located. Such creation errors do not reflect on the standard of care. ? ?Author: ?Lorella Nimrod, MD ?09/26/2021 2:51 PM ? ?For on call review [email or web address].  ?

## 2021-09-26 NOTE — Progress Notes (Signed)
Physical Therapy Treatment ?Patient Details ?Name: Maria [name] ?MRN: [id number] ?DOB: Aug 07, 1970 ?Today's Date: 09/26/2021 ? ? ?History of Present Illness Pt is a 51 year old female admitted on on 09/13/2021 under general surgery due to pneumoperitoneum secondary to perforated peptic ulcer, patient was taken to the OR s/p ex lap with primary closure of perforated peptic ulcer with Phillip Heal patch repair with intra-abdominal abscess drainage.  Postop patient became unresponsive so code stroke was called but work-up was negative. Patient underwent another exploratory laparotomy on 09/17/2021 when peptic ulcer perforation repair was redone along with G and J-tube placement, intra-abdominal abscess was also drained. TPN started 09/18/21. ? ?  ?PT Comments  ? ? Pt is making gradual progress towards goals, limiting factors include NG tube to suction. Slightly unsteady due to sedentary hospital stay. Very pleasant and motivated to participate, able to perform B LE there-ex. Will continue to progress as able.  ?Recommendations for follow up therapy are one component of a multi-disciplinary discharge planning process, led by the attending physician.  Recommendations may be updated based on patient status, additional functional criteria and insurance authorization. ? ?Follow Up Recommendations ? Home health PT ?  ?  ?Assistance Recommended at Discharge PRN  ?Patient can return home with the following A little help with walking and/or transfers;A little help with bathing/dressing/bathroom;Help with stairs or ramp for entrance ?  ?Equipment Recommendations ? Rolling walker (2 wheels)  ?  ?Recommendations for Other Services   ? ? ?  ?Precautions / Restrictions Precautions ?Precautions: Fall ?Precaution Comments: ng tube, jp drain ?Restrictions ?Weight Bearing Restrictions: No  ?  ? ?Mobility ? Bed Mobility ?Overal bed mobility: Needs Assistance ?Bed Mobility: Supine to Sit ?  ?  ?Supine to sit: HOB elevated, Min guard ?  ?  ?General  bed mobility comments: reaches out for therapist hand to perform transfer. Once seated, upright posture noted ?  ? ?Transfers ?Overall transfer level: Needs assistance ?Equipment used: 1 person hand held assist ?Transfers: Sit to/from Stand ?Sit to Stand: Min assist ?  ?  ?  ?  ?  ?General transfer comment: needs slight assistance with standing with post bias, braces against bed. No dizziness noted with upright posture ?  ? ?Ambulation/Gait ?Ambulation/Gait assistance: Min guard ?Gait Distance (Feet): 25 Feet ?Assistive device: 1 person hand held assist ?Gait Pattern/deviations: Step-to pattern ?  ?  ?  ?General Gait Details: ambulated in forward/backward and with side steps. 1 LOB noted with min assist for correction. HHA given for assist ? ? ?Stairs ?  ?  ?  ?  ?  ? ? ?Wheelchair Mobility ?  ? ?Modified Rankin (Stroke Patients Only) ?  ? ? ?  ?Balance Overall balance assessment: Needs assistance ?Sitting-balance support: Feet supported ?Sitting balance-Leahy Scale: Normal ?  ?  ?Standing balance support: Single extremity supported, During functional activity ?Standing balance-Leahy Scale: Fair ?  ?  ?  ?  ?  ?  ?  ?  ?  ?  ?  ?  ?  ? ?  ?Cognition Arousal/Alertness: Awake/alert ?Behavior During Therapy: [facility] for tasks assessed/performed ?Overall Cognitive Status: Within Functional Limits for tasks assessed ?  ?  ?  ?  ?  ?  ?  ?  ?  ?  ?  ?  ?  ?  ?  ?  ?General Comments: alert and oriented x4 ?  ?  ? ?  ?Exercises Other Exercises ?Other Exercises: supine ther-ex performed on B LE including AP, resisted heel  slides, and hip abd/add. 12 reps with supervision ? ?  ?General Comments   ?  ?  ? ?Pertinent Vitals/Pain Pain Assessment ?Pain Assessment: Faces ?Faces Pain Scale: Hurts little more ?Pain Location: abdomen ?Pain Descriptors / Indicators: Discomfort ?Pain Intervention(s): Limited activity within patient's tolerance, Repositioned  ? ? ?Home Living   ?  ?  ?  ?  ?  ?  ?  ?  ?  ?   ?  ?Prior Function    ?  ?  ?    ? ?PT Goals (current goals can now be found in the care plan section) Acute Rehab PT Goals ?Patient Stated Goal: to get better and go home. ?PT Goal Formulation: With patient ?Time For Goal Achievement: 10/06/21 ?Potential to Achieve Goals: Good ?Progress towards PT goals: Progressing toward goals ? ?  ?Frequency ? ? ? Min 2X/week ? ? ? ?  ?PT Plan Current plan remains appropriate  ? ? ?Co-evaluation   ?  ?  ?  ?  ? ?  ?AM-PAC PT "6 Clicks" Mobility   ?Outcome Measure ? Help needed turning from your back to your side while in a flat bed without using bedrails?: A Little ?Help needed moving from lying on your back to sitting on the side of a flat bed without using bedrails?: A Little ?Help needed moving to and from a bed to a chair (including a wheelchair)?: A Little ?Help needed standing up from a chair using your arms (e.g., wheelchair or bedside chair)?: A Little ?Help needed to walk in hospital room?: A Little ?Help needed climbing 3-5 steps with a railing? : A Little ?6 Click Score: 18 ? ?  ?End of Session   ?Activity Tolerance: Patient tolerated treatment well ?Patient left: in bed;with bed alarm set;with family/visitor present ?Nurse Communication: Mobility status ?PT Visit Diagnosis: Unsteadiness on feet (R26.81);Muscle weakness (generalized) (M62.81);Difficulty in walking, not elsewhere classified (R26.2) ?  ? ? ?Time: 6378-5885 ?PT Time Calculation (min) (ACUTE ONLY): 14 min ? ?Charges:  $Therapeutic Exercise: 8-22 mins          ?          ? ?Greggory Stallion, PT, DPT, GCS ?[phone] ? ? ? ?Maria [name] ?09/26/2021, 1:27 PM ? ?

## 2021-09-26 NOTE — Progress Notes (Signed)
Patient  clinically stable post 12 FR abscess perihepatic drain placement per Dr Kathlene Cote, tolerated well emptied 220 ml brownish/red thick purulent drainage with drain placement. Received Versed  2 mg along with Fentanyl 100 mcg IV for procedure. Report given to [facility] post procedure on 2C at bedside. ?

## 2021-09-26 NOTE — Progress Notes (Addendum)
Subjective:  ?CC: ?Maria [name] is a 51 y.o. female  Hospital stay day 12, 9 Days Post-Op exploratory laparotomy, redo peptic ulcer perforation repair, G and J tube placement. ? ?HPI: ?No acute issues overnight reported.   ? ?ROS:  ?General: Denies weight loss, weight gain, fatigue, fevers, chills, and night sweats. ?Heart: Denies chest pain, palpitations, racing heart, irregular heartbeat, leg pain or swelling, and decreased activity tolerance. ?Respiratory: Denies breathing difficulty, shortness of breath, wheezing, cough, and sputum. ?GI: Denies change in appetite, heartburn, nausea, vomiting, constipation, diarrhea, and blood in stool. ?GU: Denies difficulty urinating, pain with urinating, urgency, frequency, blood in urine. ? ? ?Objective:  ? ?Temp:  [98.3 ?F (36.8 ?C)-98.9 ?F (37.2 ?C)] 98.3 ?F (36.8 ?C) (03/28 0751) ?Pulse Rate:  [81-95] 84 (03/28 1451) ?Resp:  [16-20] 16 (03/28 1451) ?BP: (113-137)/(53-93) 132/68 (03/28 1451) ?SpO2:  [97 %-100 %] 100 % (03/28 1451)     Height: '5\' 2"'$  (157.5 cm) Weight: 52.9 kg BMI (Calculated): 21.33  ? ?Intake/Output this shift:  ? ?Intake/Output Summary (Last 24 hours) at 09/26/2021 1557 ?Last data filed at 09/26/2021 1205 ?Gross per 24 hour  ?Intake 1237.05 ml  ?Output 1410 ml  ?Net -172.95 ml  ? ? ?Constitutional :  alert, cooperative, appears stated age, and no distress  ?Respiratory:  clear to auscultation bilaterally  ?Cardiovascular:  regular rate and rhythm  ?Gastrointestinal: Soft, some TTP around incision, no guarding. JP with 88m of serosanguinous output past 24hrs. NG with bilious output .  G and J tube remains clamped  ?Skin: Cool and moist. Staples c/d/i  ?Psychiatric: Normal affect, non-agitated, not confused  ?   ?  ?LABS:  ? ?  Latest Ref Rng & Units 09/25/2021  ?  4:57 AM 09/24/2021  ?  4:08 AM 09/23/2021  ?  5:03 AM  ?CMP  ?Glucose 70 - 99 mg/dL 133   135   116    ?BUN 6 - 20 mg/dL '27   24   27    '$ ?Creatinine 0.44 - 1.00 mg/dL 0.83   0.93   0.99    ?Sodium  135 - 145 mmol/L 137   137   138    ?Potassium 3.5 - 5.1 mmol/L 4.5   4.7   4.4    ?Chloride 98 - 111 mmol/L 107   108   107    ?CO2 22 - 32 mmol/L '22   22   23    '$ ?Calcium 8.9 - 10.3 mg/dL 8.7   8.7   8.4    ?Total Protein 6.5 - 8.1 g/dL 6.4    5.9    ?Total Bilirubin 0.3 - 1.2 mg/dL 1.9    1.6    ?Alkaline Phos 38 - 126 U/L 209    185    ?AST 15 - 41 U/L 22    26    ?ALT 0 - 44 U/L 17    20    ? ? ?  Latest Ref Rng & Units 09/25/2021  ?  4:57 AM 09/24/2021  ?  4:08 AM 09/23/2021  ? 11:45 PM  ?CBC  ?WBC 4.0 - 10.5 K/uL 10.7   11.0   12.8    ?Hemoglobin 12.0 - 15.0 g/dL 9.8   10.4   10.3    ?Hematocrit 36.0 - 46.0 % 30.4   32.2   31.6    ?Platelets 150 - 400 K/uL 121   122   123    ? ? ?RADS: ? ?Assessment:  ? ?  S/p exploratory laparotomy, redo peptic ulcer perforation repair, G and J tube placement, due to persistent leak after intial graham patch repair ? ? Vitals remain stable.  Hgb drop not as severe the past few days.  Leukocytosis actually improving as well, but CT shows persistent leak, with likely fascial dehiscence.  No change in physical exam since most recent CT.   ? ?Due to persistent leak, will continue with bowel rest, TPN, IV ABX and antifungal for intra-abdominal infection coverage for another week (end date 10/03/21) ? ?[facility] recommended continuing current management, no additional surgical interventions or care available nor recommended at this time.  Will reach out to [facility] next to confirm no additional treatments available.  ? ?IR successfully placed drained fluid in abdomen.  Will monitor ? ? ? ?Swish and spit ok but strict NPO otherwise. ? ?labs/images/medications/previous chart entries reviewed personally and relevant changes/updates noted above. ? ? ?

## 2021-09-26 NOTE — Procedures (Signed)
Interventional Radiology Procedure Note ? ?Procedure: CT Guided Drainage of perihepatic abscess ? ?Complications: None ? ?Estimated Blood Loss: < 10 mL ? ?Findings: ?12 Fr drain placed in lateral perihepatic fluid collection with return of bloody and turbid fluid. Fluid sample sent for culture analysis. Drain attached to suction bulb drainage. ? ?Will follow. ? ?Maria [name], M.D ?Pager:  [phone] ? ?  ?

## 2021-09-27 LAB — CBC
HCT: 29.1 % — ABNORMAL LOW (ref 36.0–46.0)
Hemoglobin: 9 g/dL — ABNORMAL LOW (ref 12.0–15.0)
MCH: 32.7 pg (ref 26.0–34.0)
MCHC: 30.9 g/dL (ref 30.0–36.0)
MCV: 105.8 fL — ABNORMAL HIGH (ref 80.0–100.0)
Platelets: 145 10*3/uL — ABNORMAL LOW (ref 150–400)
RBC: 2.75 MIL/uL — ABNORMAL LOW (ref 3.87–5.11)
RDW: 17.1 % — ABNORMAL HIGH (ref 11.5–15.5)
WBC: 9.6 10*3/uL (ref 4.0–10.5)
nRBC: 0 % (ref 0.0–0.2)

## 2021-09-27 LAB — GLUCOSE, CAPILLARY
Glucose-Capillary: 127 mg/dL — ABNORMAL HIGH (ref 70–99)
Glucose-Capillary: 128 mg/dL — ABNORMAL HIGH (ref 70–99)
Glucose-Capillary: 137 mg/dL — ABNORMAL HIGH (ref 70–99)
Glucose-Capillary: 137 mg/dL — ABNORMAL HIGH (ref 70–99)

## 2021-09-27 MED ORDER — TRAVASOL 10 % IV SOLN
INTRAVENOUS | Status: AC
  Filled 2021-09-27: qty 900.1

## 2021-09-27 NOTE — Progress Notes (Signed)
Subjective:  ?CC: ?Maria [name] is a 51 y.o. female  Hospital stay day 13, 10 Days Post-Op exploratory laparotomy, redo peptic ulcer perforation repair, G and J tube placement. ? ?HPI: ?No acute issues overnight reported.   ? ?ROS:  ?General: Denies weight loss, weight gain, fatigue, fevers, chills, and night sweats. ?Heart: Denies chest pain, palpitations, racing heart, irregular heartbeat, leg pain or swelling, and decreased activity tolerance. ?Respiratory: Denies breathing difficulty, shortness of breath, wheezing, cough, and sputum. ?GI: Denies change in appetite, heartburn, nausea, vomiting, constipation, diarrhea, and blood in stool. ?GU: Denies difficulty urinating, pain with urinating, urgency, frequency, blood in urine. ? ? ?Objective:  ? ?Temp:  [98.2 ?F (36.8 ?C)-98.9 ?F (37.2 ?C)] 98.2 ?F (36.8 ?C) (03/29 0734) ?Pulse Rate:  [84-96] 92 (03/29 0734) ?Resp:  [15-20] 15 (03/29 0734) ?BP: (121-142)/(58-93) 135/60 (03/29 0734) ?SpO2:  [96 %-100 %] 96 % (03/29 0734)     Height: '5\' 2"'$  (157.5 cm) Weight: 52.9 kg BMI (Calculated): 21.33  ? ?Intake/Output this shift:  ? ?Intake/Output Summary (Last 24 hours) at 09/27/2021 1314 ?Last data filed at 09/27/2021 0800 ?Gross per 24 hour  ?Intake 606.45 ml  ?Output 875 ml  ?Net -268.55 ml  ? ? ?Constitutional :  alert, cooperative, appears stated age, and no distress  ?Respiratory:  clear to auscultation bilaterally  ?Cardiovascular:  regular rate and rhythm  ?Gastrointestinal: Soft, some TTP around incision, no guarding. JP with 58m of serosanguinous output past 24hrs. NG with bilious output .  G and J tube remains clamped. IR drain with purulent/sanguinous drainage.  ?Skin: Cool and moist. Staples c/d/i  ?Psychiatric: Normal affect, non-agitated, not confused  ?   ?  ?LABS:  ? ?  Latest Ref Rng & Units 09/25/2021  ?  4:57 AM 09/24/2021  ?  4:08 AM 09/23/2021  ?  5:03 AM  ?CMP  ?Glucose 70 - 99 mg/dL 133   135   116    ?BUN 6 - 20 mg/dL '27   24   27    '$ ?Creatinine 0.44 -  1.00 mg/dL 0.83   0.93   0.99    ?Sodium 135 - 145 mmol/L 137   137   138    ?Potassium 3.5 - 5.1 mmol/L 4.5   4.7   4.4    ?Chloride 98 - 111 mmol/L 107   108   107    ?CO2 22 - 32 mmol/L '22   22   23    '$ ?Calcium 8.9 - 10.3 mg/dL 8.7   8.7   8.4    ?Total Protein 6.5 - 8.1 g/dL 6.4    5.9    ?Total Bilirubin 0.3 - 1.2 mg/dL 1.9    1.6    ?Alkaline Phos 38 - 126 U/L 209    185    ?AST 15 - 41 U/L 22    26    ?ALT 0 - 44 U/L 17    20    ? ? ?  Latest Ref Rng & Units 09/27/2021  ?  3:42 AM 09/25/2021  ?  4:57 AM 09/24/2021  ?  4:08 AM  ?CBC  ?WBC 4.0 - 10.5 K/uL 9.6   10.7   11.0    ?Hemoglobin 12.0 - 15.0 g/dL 9.0   9.8   10.4    ?Hematocrit 36.0 - 46.0 % 29.1   30.4   32.2    ?Platelets 150 - 400 K/uL 145   121   122    ? ? ?  RADS: ? ?Assessment:  ? ?S/p exploratory laparotomy, redo peptic ulcer perforation repair, G and J tube placement, due to persistent leak after intial graham patch repair ? ? Vitals remain stable.  Hgb drop not as severe the past few days.  Leukocytosis resolved. No change in physical exam. ? ?Due to persistent leak, will continue with bowel rest, TPN, IV ABX and antifungal for intra-abdominal infection coverage for another week (end date 10/03/21) ? ?[facility] recommended continuing current management, no additional surgical interventions or care available nor recommended at this time.  Will reach out to [facility] next to confirm no additional treatments available.  ? ?Swish and spit ok but strict NPO otherwise. ? ?labs/images/medications/previous chart entries reviewed personally and relevant changes/updates noted above. ? ? ?

## 2021-09-27 NOTE — Progress Notes (Signed)
PT Cancellation Note ? ?Patient Details ?Name: Maria [name] ?MRN: [id number] ?DOB: 1970/09/17 ? ? ?Cancelled Treatment:     PT attempt. Pt politely requested to hold PT this afternoon due to fatigue. She was easily and safely able to ambulate with OT earlier in the day. Can you come back tomorrow?" As requested, Pryor Curia will return tomorrow and continue to follow per current POC.   ? ? ?Willette Pa ?09/27/2021, 2:37 PM ?

## 2021-09-27 NOTE — Progress Notes (Signed)
Occupational Therapy Treatment ?Patient Details ?Name: Maria [name] ?MRN: [id number] ?DOB: 1971/05/10 ?Today's Date: 09/27/2021 ? ? ?History of present illness Pt is a 51 year old female admitted on on 09/13/2021 under general surgery due to pneumoperitoneum secondary to perforated peptic ulcer, patient was taken to the OR s/p ex lap with primary closure of perforated peptic ulcer with Phillip Heal patch repair with intra-abdominal abscess drainage.  Postop patient became unresponsive so code stroke was called but work-up was negative. Patient underwent another exploratory laparotomy on 09/17/2021 when peptic ulcer perforation repair was redone along with G and J-tube placement, intra-abdominal abscess was also drained. TPN started 09/18/21. ?  ?OT comments ? Pt seen for OT treatment on this date. Upon arrival to room, pt returning to bed following toilet transfer with RN. Pt agreeable to OT tx and vocalizing goal to walk around RN station. Pt placed off suction for session. Pt required SUPERVISION/SET-UP to don slip on shoes while seated EOB and SUPERVISION to walk 122f with single UE supported by IV pole. Following return to room, pt reporting 8/10 on RPE scale. Following seated rest break, pt performed x2 standing grooming tasks and UB bathing while standing at sink, requiring SUPERVISION d/t decreased balance and activity tolerance. Pt is making good progress toward goals and continues to benefit from skilled OT services to maximize return to PLOF and minimize risk of future falls, injury, caregiver burden, and readmission. Will continue to follow POC. Discharge recommendation remains appropriate.    ? ?Recommendations for follow up therapy are one component of a multi-disciplinary discharge planning process, led by the attending physician.  Recommendations may be updated based on patient status, additional functional criteria and insurance authorization. ?   ?Follow Up Recommendations ? Home health OT  ?  ?Assistance  Recommended at Discharge Intermittent Supervision/Assistance  ?Patient can return home with the following ? A little help with walking and/or transfers;A little help with bathing/dressing/bathroom;Assistance with cooking/housework ?  ?Equipment Recommendations ? None recommended by OT  ?  ?   ?Precautions / Restrictions Precautions ?Precautions: Fall ?Precaution Comments: ng tube, jp drain ?Restrictions ?Weight Bearing Restrictions: No  ? ? ?  ? ?Mobility Bed Mobility ?Overal bed mobility: Needs Assistance ?Bed Mobility: Sit to Supine ?  ?  ?  ?Sit to supine: Supervision, HOB elevated ?  ?  ?  ? ?Transfers ?Overall transfer level: Needs assistance ?Equipment used: None ?Transfers: Sit to/from Stand ?Sit to Stand: Min guard ?  ?  ?  ?  ?  ?  ?  ?  ?Balance Overall balance assessment: Needs assistance ?Sitting-balance support: No upper extremity supported, Feet supported ?Sitting balance-Leahy Scale: Good ?  ?  ?Standing balance support: Single extremity supported, During functional activity ?Standing balance-Leahy Scale: Fair ?Standing balance comment: Requires MIN GUARD for functional mobility (1859f with single UE supported by IV pole ?  ?  ?  ?  ?  ?  ?  ?  ?  ?  ?  ?   ? ?ADL either performed or assessed with clinical judgement  ? ?ADL Overall ADL's : Needs assistance/impaired ?  ?  ?Grooming: Wash/dry hands;Wash/dry face;Supervision/safety;Standing ?  ?Upper Body Bathing: Supervision/ safety;Set up;Standing ?  ?  ?  ?  ?  ?Lower Body Dressing: Supervision/safety;Set up;Sitting/lateral leans ?Lower Body Dressing Details (indicate cue type and reason): to don slip on shoes ?  ?  ?  ?  ?  ?  ?Functional mobility during ADLs: Min guard (to walk 18061fith IV pole) ?  ?  ? ? ? ?  Cognition Arousal/Alertness: Awake/alert ?Behavior During Therapy: [facility] for tasks assessed/performed ?Overall Cognitive Status: Within Functional Limits for tasks assessed ?  ?  ?  ?  ?  ?  ?  ?  ?  ?  ?  ?  ?  ?  ?  ?  ?  ?  ?  ?   ?   ?   ?    ? ? ?Pertinent Vitals/ Pain       Pain Assessment ?Pain Assessment: No/denies pain ? ?   ?   ? ?Frequency ? Min 2X/week  ? ? ? ? ?  ?Progress Toward Goals ? ?OT Goals(current goals can now be found in the care plan section) ? Progress towards OT goals: Progressing toward goals ? ?Acute Rehab OT Goals ?Patient Stated Goal: to get stronger ?OT Goal Formulation: With patient ?Time For Goal Achievement: 10/06/21 ?Potential to Achieve Goals: Good  ?Plan Discharge plan remains appropriate;Frequency remains appropriate   ? ?   ?AM-PAC OT "6 Clicks" Daily Activity     ?Outcome Measure ? ? Help from another person eating meals?: Total ?Help from another person taking care of personal grooming?: A Little ?Help from another person toileting, which includes using toliet, bedpan, or urinal?: A Little ?Help from another person bathing (including washing, rinsing, drying)?: A Little ?Help from another person to put on and taking off regular upper body clothing?: None ?Help from another person to put on and taking off regular lower body clothing?: A Little ?6 Click Score: 17 ? ?  ?End of Session Equipment Utilized During Treatment: Other (comment) (IV pole) ? ?OT Visit Diagnosis: Unsteadiness on feet (R26.81);Muscle weakness (generalized) (M62.81) ?  ?Activity Tolerance Patient tolerated treatment well ?  ?Patient Left in bed;with call bell/phone within reach;with bed alarm set;with family/visitor present ?  ?Nurse Communication Mobility status ?  ? ?   ? ?Time: 0354-6568 ?OT Time Calculation (min): 23 min ? ?Charges: OT General Charges ?$OT Visit: 1 Visit ?OT Treatments ?$Self Care/Home Management : 23-37 mins ? ?Fredirick Maudlin, OTR/L ?[place] ? ?

## 2021-09-27 NOTE — Consult Note (Signed)
PHARMACY - TOTAL PARENTERAL NUTRITION CONSULT NOTE  ? ?Indication: Prolonged ileus ? ?Patient Measurements: ?Height: '5\' 2"'  (157.5 cm) ?Weight: 52.9 kg (116 lb 10 oz) ?IBW/kg (Calculated) : 50.1 ?TPN AdjBW (KG): 53.5 ?Body mass index is 21.33 kg/m?. ?Usual Weight: 55kg ? ?Assessment: [name] s/p [place] Field of non-healing perforated peptic ulcer c/b prolonged ileus. GJ tube placed 3/19 and Pharmacy consulted for mgmt of TPN and electrolytes. ? ?Glucose / Insulin: BG <180 ?--3 units SSI required past 24 hours ?Electrolytes: WNL ?Renal: Scr <1, stable ?Hepatic: Alk Phos 209, T bili 1.9 ?Intake / Output; MIVF: no MIVF ?GI Imaging: ?3/19 CT abd: Anterior antral gastric wall perforation with associated moderate volume pneumoperitoneum, small volume ascites, extravasated PO contrast. Colonic diverticulosis & Chronic pancreatitis. ? ?3/16 CT abd/pelvis:  Decreased pneumoperitoneum, Phillip Heal patch repair of the perforated ?distal stomach, postoperative drain at the greater omentum. Small volume mostly simple density fluid in the abdomen is primarily around the liver. No bowel obstruction. Chronic calcific pancreatitis ? ?GI Surgeries / Procedures: 03/19 exploratory laparotomy ? ?Central access: 09/18/21 ?TPN start date: 09/18/21 ? ?Nutritional Goals: ?Goal TPN rate is 65 mL/hr (provides 90.1 g of protein and 1676.7 kcals per day) ? ?RD Assessment: ?Estimated Needs ?Total Energy Estimated Needs: 1600-1800kcal/day ?Total Protein Estimated Needs: 80-90g/day ?Total Fluid Estimated Needs: 1.6-1.8L/day ? ?Current Nutrition:  ?NPO ? ?Plan:  ?Continue TPN at goal rate of 65 mL/hr at 1800 (total volume including overfill 1660 mL) ?Nutritional Components ?Amino acids (using 10% Travasol): 90.1 grams ?Dextrose: 249.6 grams ?Lipids (using 20% SMOFlipids): 46.8 grams ?Electrolytes in TPN: Na 457mq/L, K 447m/L, Ca 57m11mL, Mg 36m79m, and Phos 157mm28m. Cl:Ac 1:2 ?A small amount of sodium added back as of 03/24 ?Add standard MVI,  thiamine 100 mg and trace elements to TPN ?Continue Sensitive SSI to q6h and adjust as needed  ?Monitor TPN labs on Mon/Thurs ? ?Michalene Debruler O Biagio Snelson ?09/27/2021,7:40 AM ? ?

## 2021-09-27 NOTE — Progress Notes (Signed)
? ? ?Referring Physician(s): ?[name], DO ? ?Supervising Physician: Ruthann Cancer ? ?Patient Status:  [facility] - In-pt ? ?Reason for Consult: ?Perforated gastric ulcer s/p graham patch repair 3/15 with persistent leak seen and s/p repeat ulcer graham patch repair and G and J tube placement 3/19 ?CT done 3/27 with findings of perihepatic fluid collection s/p successful 20F percutaneous drain placed 3/28 by IR ? ?Subjective: ?Patient states overall she feels the same no real improvement, she denies any complications or issues at IR drain site. She denies any new fevers.  ? ?Allergies: ?Anesthesia s-i-40 [propofol], Bee venom, Codeine, and Bupropion ? ?Medications: ?Prior to Admission medications   ?Medication Sig Start Date End Date Taking? Authorizing Provider  ?meloxicam (MOBIC) 15 MG tablet Take 15 mg by mouth daily. 08/30/21  Yes [provider]  ?omeprazole (PRILOSEC) 20 MG capsule Take 1 capsule (20 mg total) by mouth daily. 09/06/21  Yes [name], FNP  ?ondansetron (ZOFRAN) 4 MG tablet Take 1 tablet (4 mg total) by mouth every 8 (eight) hours as needed for nausea or vomiting. 09/06/21  Yes [name], FNP  ?cyanocobalamin ([employer] VITAMIN B12) 2000 MCG tablet Take 1 tablet (2,000 mcg total) by mouth daily. ?Patient not taking: Reported on 09/14/2021 02/02/21   [name], MD  ?dicyclomine (BENTYL) 10 MG capsule Take 1 capsule (10 mg total) by mouth 4 (four) times daily -  before meals and at bedtime for 3 days. 09/07/21 09/10/21  [name], MD  ?EPINEPHrine 0.3 mg/0.3 mL IJ SOAJ injection Inject 0.3 mLs (0.3 mg total) into the muscle as needed for anaphylaxis. 11/11/18   [name], NP  ?fluticasone (FLONASE) 50 MCG/ACT nasal spray Place 2 sprays into both nostrils daily. ?Patient not taking: Reported on 09/14/2021 07/20/21   [name], MD  ?folic acid (FOLVITE) 1 MG tablet Take 1 tablet (1 mg total) by mouth daily. ?Patient not taking: Reported on 09/14/2021 02/02/21   [name], MD  ?hydrOXYzine (ATARAX/VISTARIL) 10 MG tablet Take 1-2 tablets (10-20 mg total) by mouth 3 (three) times daily as needed for itching. ?Patient not taking: Reported on 09/14/2021 11/13/19   [name], NP  ?Multiple Vitamin (MULTIVITAMIN) tablet Take 1 tablet by mouth daily. ?Patient not taking: Reported on 09/14/2021    [provider]  ?thiamine (VITAMIN B-1) 50 MG tablet Take 1 tablet (50 mg total) by mouth daily. ?Patient not taking: Reported on 09/14/2021 05/29/21   [name], MD  ? ?Vital Signs: ?BP 135/60 (BP Location: Left Arm)   Pulse 92   Temp 98.2 ?F (36.8 ?C) (Oral)   Resp 15   Ht '5\' 2"'$  (1.575 m)   Wt 116 lb 10 oz (52.9 kg)   LMP 02/24/2018 (Approximate) Comment: spotting   SpO2 96%   BMI 21.33 kg/m?  ? ?Physical Exam ? ?General: A&O, NAD, lying in bed ?Abd: Surgical incision intact and dry, surgical drains intact  ?RUQ 20F IR drain dressing C/D/I with 5 cc of turbid bloody output in JP, minimal TTP ? ?Output by Drain (mL) 09/25/21 0701 - 09/25/21 1900 09/25/21 1901 - 09/26/21 0700 09/26/21 0701 - 09/26/21 1900 09/26/21 1901 - 09/27/21 0700 09/27/21 0701 - 09/27/21 1535  ?Closed System Drain 1 Left Abdomen Bulb (JP) 15 Fr.  10  5   ?Closed System Drain RLQ Bulb (JP) 12 Fr.   100 140 30  ?Gastrostomy/Enterostomy Jejunostomy 18 Fr. LUQ       ?Gastrostomy/Enterostomy Gastrostomy 14 Fr. LUQ       ? ? ?  Imaging: ?DG Abd 1 View ? ?Result Date: 09/25/2021 ?CLINICAL DATA:  Concern for free air EXAM: ABDOMEN - 1 VIEW COMPARISON:  Abdominal radiograph dated September 19, 2021 FINDINGS: ET tube tip and side port project over the expected area of the stomach. Gastrostomy and jejunostomy tubes noted. Nonobstructive bowel gas pattern. A few bubbly lucencies are seen in the left upper quadrant which are decreased compared with prior exam. Partially visualized lung bases demonstrate bibasilar atelectasis. IMPRESSION: A few bubbly lucencies are seen in the left upper quadrant which are  decreased compared with prior exam, likely due to resolving postsurgical air. Electronically Signed   By: Yetta Glassman M.D.   On: 09/25/2021 09:41  ? ?CT ABDOMEN PELVIS W CONTRAST ? ?Result Date: 09/25/2021 ?CLINICAL DATA:  Perforated peptic ulcer with persistent leak, post laparotomy and repair perforated ulcer on 09/17/2021 EXAM: CT ABDOMEN AND PELVIS WITH CONTRAST TECHNIQUE: Multidetector CT imaging of the abdomen and pelvis was performed using the standard protocol following bolus administration of intravenous contrast. RADIATION DOSE REDUCTION: This exam was performed according to the departmental dose-optimization program which includes automated exposure control, adjustment of the mA and/or kV according to patient size and/or use of iterative reconstruction technique. CONTRAST:  155m OMNIPAQUE IOHEXOL 300 MG/ML SOLN IV. Dilute oral contrast. COMPARISON:  09/20/2021 FINDINGS: Lower chest: Roof small bibasilar pleural effusions and dependent lower lobe atelectasis Hepatobiliary: Subcapsular fluid collection lateral liver, mixed high and low attenuation, question subcapsular hematoma. Few tiny foci of gas within the subcapsular collection, could be related to prior surgery or breakdown of blood products but subcapsular abscess not excluded. Additional small subcapsular collection at lateral segment LEFT lobe liver. Liver otherwise unremarkable. High attenuation material within gallbladder question vicarious excretion of prior contrast material versus sludge. No biliary dilatation. Pancreas: Atrophic pancreas with ductal dilatation and numerous calcifications consistent with chronic calcific pancreatitis and atrophy. No mass. Spleen: Mildly prominent in size 13.2 cm length with calculated volume of 433 mL. No focal lesions. Adrenals/Urinary Tract: Adrenal glands, kidneys, ureters, and bladder normal appearance Stomach/Bowel: Gastrostomy tube within stomach. Nasogastric tube within stomach. Wall thickening of  gastric antrum to pylorus. Remainder of stomach normal appearance. Normal appendix. Large and small bowel loops normal appearance. Vascular/Lymphatic: Atherosclerotic calcifications aorta without aneurysm. Scattered normal sized lymph nodes perigastric, para-aortic. Reproductive: Uterus surgically absent.  Ovaries unremarkable. Other: Small amount of free fluid in pelvis. Free fluid adjacent to liver, stomach, RIGHT pericolic gutter, and extending into an incisional hernia. Free air throughout upper abdomen and extending into the hernia. Tiny fluid collection mid abdomen anteriorly 22 x 12 mm. Surgical drain upper abdomen. No additional fluid collections identified. Musculoskeletal: No acute osseous findings. IMPRESSION: Subcapsular fluid collection lateral liver, mixed high and low attenuation, question subcapsular hematoma. Few tiny foci of gas within the subcapsular collection, could be related to prior surgery or breakdown of blood products but subcapsular abscess not excluded. Additional small subcapsular collection at lateral segment LEFT lobe liver. Free intraperitoneal air and fluid, including extending into an incisional hernia; the amount of free air is prominent for 8 days post surgery though patient has an indwelling anterior upper abdominal surgical drain which could account for free intraperitoneal air. The lack of GI contrast extravasation makes persistent leak less likely, though the leak could be from the anti dependent wall and may not have been demonstrated by CT. Wall thickening of gastric antrum to pylorus. Tiny focal fluid collection in mid abdomen 22 x 12 mm question sterile postoperative versus tiny abscess.  Small bibasilar pleural effusions and dependent lower lobe atelectasis. Chronic calcific pancreatitis. Aortic Atherosclerosis (ICD10-I70.0). Electronically Signed   By: Lavonia Dana M.D.   On: 09/25/2021 15:35  ? ?CT IMAGE GUIDED DRAINAGE BY PERCUTANEOUS CATHETER ? ?Result Date:  09/26/2021 ?CLINICAL DATA:  Status post repair of perforated gastric ulcer with development perihepatic hematoma and fluid collection requiring drainage. EXAM: CT GUIDED CATHETER DRAINAGE OF PERITONEAL PERIHEPATIC ABSCESS ANES

## 2021-09-28 LAB — CBC
HCT: 28.4 % — ABNORMAL LOW (ref 36.0–46.0)
Hemoglobin: 8.8 g/dL — ABNORMAL LOW (ref 12.0–15.0)
MCH: 34 pg (ref 26.0–34.0)
MCHC: 31 g/dL (ref 30.0–36.0)
MCV: 109.7 fL — ABNORMAL HIGH (ref 80.0–100.0)
Platelets: 149 10*3/uL — ABNORMAL LOW (ref 150–400)
RBC: 2.59 MIL/uL — ABNORMAL LOW (ref 3.87–5.11)
RDW: 16.8 % — ABNORMAL HIGH (ref 11.5–15.5)
WBC: 9.1 10*3/uL (ref 4.0–10.5)
nRBC: 0 % (ref 0.0–0.2)

## 2021-09-28 LAB — COMPREHENSIVE METABOLIC PANEL
ALT: 15 U/L (ref 0–44)
AST: 22 U/L (ref 15–41)
Albumin: 2 g/dL — ABNORMAL LOW (ref 3.5–5.0)
Alkaline Phosphatase: 198 U/L — ABNORMAL HIGH (ref 38–126)
Anion gap: 6 (ref 5–15)
BUN: 22 mg/dL — ABNORMAL HIGH (ref 6–20)
CO2: 22 mmol/L (ref 22–32)
Calcium: 8.6 mg/dL — ABNORMAL LOW (ref 8.9–10.3)
Chloride: 106 mmol/L (ref 98–111)
Creatinine, Ser: 0.79 mg/dL (ref 0.44–1.00)
GFR, Estimated: >60 mL/min (ref >60)
Glucose, Bld: 137 mg/dL — ABNORMAL HIGH (ref 70–99)
Potassium: 4.1 mmol/L (ref 3.5–5.1)
Sodium: 134 mmol/L — ABNORMAL LOW (ref 135–145)
Total Bilirubin: 1.4 mg/dL — ABNORMAL HIGH (ref 0.3–1.2)
Total Protein: 7.2 g/dL (ref 6.5–8.1)

## 2021-09-28 LAB — GLUCOSE, CAPILLARY
Glucose-Capillary: 125 mg/dL — ABNORMAL HIGH (ref 70–99)
Glucose-Capillary: 132 mg/dL — ABNORMAL HIGH (ref 70–99)
Glucose-Capillary: 105 mg/dL — ABNORMAL HIGH (ref 70–99)

## 2021-09-28 LAB — MAGNESIUM: Magnesium: 1.8 mg/dL (ref 1.7–2.4)

## 2021-09-28 LAB — PHOSPHORUS: Phosphorus: 3.5 mg/dL (ref 2.5–4.6)

## 2021-09-28 MED ORDER — TRAVASOL 10 % IV SOLN
INTRAVENOUS | Status: AC
  Filled 2021-09-28: qty 900.1

## 2021-09-28 NOTE — Progress Notes (Signed)
Occupational Therapy Treatment ?Patient Details ?Name: Maria [name] ?MRN: [id number] ?DOB: 1971-02-09 ?Today's Date: 09/28/2021 ? ? ?History of present illness Pt is a 51 year old female admitted on on 09/13/2021 under general surgery due to pneumoperitoneum secondary to perforated peptic ulcer, patient was taken to the OR s/p ex lap with primary closure of perforated peptic ulcer with Phillip Heal patch repair with intra-abdominal abscess drainage.  Postop patient became unresponsive so code stroke was called but work-up was negative. Patient underwent another exploratory laparotomy on 09/17/2021 when peptic ulcer perforation repair was redone along with G and J-tube placement, intra-abdominal abscess was also drained. TPN started 09/18/21. ?  ?OT comments ? Chart reviewed, RN cleared pt for participation in OT tx session. Pt agreeable to session. Tx session targeted improving functional strength and endurance to facilitate return to independent PLOF. Pt performs all ADL tasks including toileting with supervision. Functional mobility completed approx 180 feet with supervision-CGA. Pt continues to perform below prior level of functioning however is making progress towards goal acquisition. Pt is left as received, NAD, all needs met. OT will continue to follow.   ? ?Recommendations for follow up therapy are one component of a multi-disciplinary discharge planning process, led by the attending physician.  Recommendations may be updated based on patient status, additional functional criteria and insurance authorization. ?   ?Follow Up Recommendations ? Home health OT  ?  ?Assistance Recommended at Discharge Intermittent Supervision/Assistance  ?Patient can return home with the following ? A little help with walking and/or transfers;A little help with bathing/dressing/bathroom;Assistance with cooking/housework ?  ?Equipment Recommendations ? None recommended by OT  ?  ?Recommendations for Other Services   ? ?  ?Precautions /  Restrictions Precautions ?Precautions: Fall ?Precaution Comments: ng tube, jp drains ?Restrictions ?Weight Bearing Restrictions: No  ? ? ?  ? ?Mobility Bed Mobility ?  ?Bed Mobility: Supine to Sit, Sit to Supine ?  ?  ?Supine to sit: Supervision, HOB elevated ?  ?  ?  ?  ? ?Transfers ?Overall transfer level: Needs assistance ?Equipment used: None ?Transfers: Sit to/from Stand ?Sit to Stand: Supervision ?  ?  ?  ?  ?  ?  ?  ?  ?Balance Overall balance assessment: Needs assistance ?Sitting-balance support: No upper extremity supported, Feet supported ?Sitting balance-Leahy Scale: Good ?  ?  ?Standing balance support: Single extremity supported, During functional activity ?Standing balance-Leahy Scale: Good ?  ?  ?  ?  ?  ?  ?  ?  ?  ?  ?  ?  ?   ? ?ADL either performed or assessed with clinical judgement  ? ?ADL Overall ADL's : Needs assistance/impaired ?  ?Eating/Feeding Details (indicate cue type and reason): NPO ?  ?  ?  ?  ?  ?  ?  ?  ?Lower Body Dressing: Supervision/safety;Sitting/lateral leans ?Lower Body Dressing Details (indicate cue type and reason): shoes, pull up/down pants during toileting ?Toilet Transfer: Supervision/safety ?  ?Toileting- Clothing Manipulation and Hygiene: Supervision/safety ?Toileting - Clothing Manipulation Details (indicate cue type and reason): 3 in 1 commode over toilet ?  ?  ?Functional mobility during ADLs: Supervision/safety;Min guard (approx 100 feet with use of IV pole) ?  ?  ? ?Extremity/Trunk Assessment   ?  ?  ?  ?  ?  ? ?Vision   ?  ?  ?Perception   ?  ?Praxis   ?  ? ?Cognition Arousal/Alertness: Awake/alert ?Behavior During Therapy: [facility] for tasks assessed/performed ?Overall Cognitive Status: Within  Functional Limits for tasks assessed ?  ?  ?  ?  ?  ?  ?  ?  ?  ?  ?  ?  ?  ?  ?  ?  ?  ?  ?  ?   ?Exercises   ? ?  ?Shoulder Instructions   ? ? ?  ?General Comments    ? ? ?Pertinent Vitals/ Pain       Pain Assessment ?Pain Assessment: No/denies pain ? ?Home Living   ?  ?  ?   ?  ?  ?  ?  ?  ?  ?  ?  ?  ?  ?  ?  ?  ?  ?  ? ?  ?Prior Functioning/Environment    ?  ?  ?  ?   ? ?Frequency ? Min 2X/week  ? ? ? ? ?  ?Progress Toward Goals ? ?OT Goals(current goals can now be found in the care plan section) ? Progress towards OT goals: Progressing toward goals ? ?Acute Rehab OT Goals ?Patient Stated Goal: get stronger ?OT Goal Formulation: With patient ?Time For Goal Achievement: 10/12/21 ?Potential to Achieve Goals: Good  ?Plan Discharge plan remains appropriate;Frequency remains appropriate   ? ?Co-evaluation ? ? ?   ?  ?  ?  ?  ? ?  ?AM-PAC OT "6 Clicks" Daily Activity     ?Outcome Measure ? ? Help from another person eating meals?: Total ?Help from another person taking care of personal grooming?: A Little ?Help from another person toileting, which includes using toliet, bedpan, or urinal?: A Little ?Help from another person bathing (including washing, rinsing, drying)?: A Little ?Help from another person to put on and taking off regular upper body clothing?: None ?Help from another person to put on and taking off regular lower body clothing?: A Little ?6 Click Score: 17 ? ?  ?End of Session Equipment Utilized During Treatment: Other (comment) (IV pole) ? ?OT Visit Diagnosis: Unsteadiness on feet (R26.81);Muscle weakness (generalized) (M62.81) ?  ?Activity Tolerance Patient tolerated treatment well ?  ?Patient Left in bed;with call bell/phone within reach;with bed alarm set;with nursing/sitter in room ?  ?Nurse Communication Mobility status ?  ? ?   ? ?Time: 3614-4315 ?OT Time Calculation (min): 17 min ? ?Charges: OT General Charges ?$OT Visit: 1 Visit ?OT Treatments ?$Therapeutic Activity: 8-22 mins ? ?Shanon Payor, OTD OTR/L  ?09/28/21, 3:40 PM  ?

## 2021-09-28 NOTE — Progress Notes (Signed)
Subjective:  ?CC: ?Maria [name] is a 51 y.o. female  Hospital stay day 14, 11 Days Post-Op exploratory laparotomy, redo peptic ulcer perforation repair, G and J tube placement. ? ?HPI: ?No acute issues overnight reported.  NG clamped since last night, G tube to gravity. ? ?ROS:  ?General: Denies weight loss, weight gain, fatigue, fevers, chills, and night sweats. ?Heart: Denies chest pain, palpitations, racing heart, irregular heartbeat, leg pain or swelling, and decreased activity tolerance. ?Respiratory: Denies breathing difficulty, shortness of breath, wheezing, cough, and sputum. ?GI: Denies change in appetite, heartburn, nausea, vomiting, constipation, diarrhea, and blood in stool. ?GU: Denies difficulty urinating, pain with urinating, urgency, frequency, blood in urine. ? ? ?Objective:  ? ?Temp:  [98.3 ?F (36.8 ?C)-99.4 ?F (37.4 ?C)] 98.7 ?F (37.1 ?C) (03/30 4665) ?Pulse Rate:  [85-90] 88 (03/30 0817) ?Resp:  [16-18] 16 (03/30 0817) ?BP: (122-136)/(57-64) 122/57 (03/30 0817) ?SpO2:  [97 %-98 %] 97 % (03/30 0817)     Height: '5\' 2"'$  (157.5 cm) Weight: 52.9 kg BMI (Calculated): 21.33  ? ?Intake/Output this shift:  ? ?Intake/Output Summary (Last 24 hours) at 09/28/2021 1800 ?Last data filed at 09/28/2021 1151 ?Gross per 24 hour  ?Intake 1878.47 ml  ?Output 190 ml  ?Net 1688.47 ml  ? ? ?Constitutional :  alert, cooperative, appears stated age, and no distress  ?Respiratory:  clear to auscultation bilaterally  ?Cardiovascular:  regular rate and rhythm  ?Gastrointestinal: Soft, TTP resolved.. JP with scant serosanguinous output. NG with scant bilious output after connected back to suction to check residual .  J tube remains clamped. IR drain with old bloody drainage.  ?Skin: Cool and moist. Staples c/d/i  ?Psychiatric: Normal affect, non-agitated, not confused  ?   ?  ?LABS:  ? ?  Latest Ref Rng & Units 09/28/2021  ?  5:30 AM 09/25/2021  ?  4:57 AM 09/24/2021  ?  4:08 AM  ?CMP  ?Glucose 70 - 99 mg/dL 137   133   135     ?BUN 6 - 20 mg/dL '22   27   24    '$ ?Creatinine 0.44 - 1.00 mg/dL 0.79   0.83   0.93    ?Sodium 135 - 145 mmol/L 134   137   137    ?Potassium 3.5 - 5.1 mmol/L 4.1   4.5   4.7    ?Chloride 98 - 111 mmol/L 106   107   108    ?CO2 22 - 32 mmol/L '22   22   22    '$ ?Calcium 8.9 - 10.3 mg/dL 8.6   8.7   8.7    ?Total Protein 6.5 - 8.1 g/dL 7.2   6.4     ?Total Bilirubin 0.3 - 1.2 mg/dL 1.4   1.9     ?Alkaline Phos 38 - 126 U/L 198   209     ?AST 15 - 41 U/L 22   22     ?ALT 0 - 44 U/L 15   17     ? ? ?  Latest Ref Rng & Units 09/28/2021  ?  3:41 AM 09/27/2021  ?  3:42 AM 09/25/2021  ?  4:57 AM  ?CBC  ?WBC 4.0 - 10.5 K/uL 9.1   9.6   10.7    ?Hemoglobin 12.0 - 15.0 g/dL 8.8   9.0   9.8    ?Hematocrit 36.0 - 46.0 % 28.4   29.1   30.4    ?Platelets 150 - 400 K/uL 149  145   121    ? ? ?RADS: ? ?Assessment:  ? ?S/p exploratory laparotomy, redo peptic ulcer perforation repair, G and J tube placement, due to persistent leak after intial graham patch repair ? ? Vitals remain stable.  Hgb drop slowing down. Leukocytosis resolved. No change in physical exam. ? ?Due to persistent leak, will continue with bowel rest, TPN, IV ABX and antifungal for intra-abdominal infection coverage for another week (end date 10/03/21) ? ?[facility] recommended continuing current management, no additional surgical interventions or care available nor recommended at this time.  Will reach out to [facility] next to confirm no additional treatments available.  ? ?Swish and spit ok but strict NPO otherwise.  NG removed today since no residual, will continue with g tube to gravity. If remains stable for another 24hrs, can consider initiating enteral feeding through J tube tomorrow to try to wean off TPN. ? ?labs/images/medications/previous chart entries reviewed personally and relevant changes/updates noted above. ? ? ?

## 2021-09-28 NOTE — Consult Note (Signed)
PHARMACY - TOTAL PARENTERAL NUTRITION CONSULT NOTE  ? ?Indication: Prolonged ileus ? ?Patient Measurements: ?Height: '5\' 2"'  (157.5 cm) ?Weight: 52.9 kg (116 lb 10 oz) ?IBW/kg (Calculated) : 50.1 ?TPN AdjBW (KG): 53.5 ?Body mass index is 21.33 kg/m?. ?Usual Weight: 55kg ? ?Assessment: [name] s/p [place] Field of non-healing perforated peptic ulcer c/b prolonged ileus. GJ tube placed 3/19 and Pharmacy consulted for mgmt of TPN and electrolytes. ? ?Glucose / Insulin: BG <180 ?--4 units SSI required past 24 hours ?Electrolytes: WNL ?Renal: Scr <1, stable ?Hepatic: Alk Phos 209>>198, T bili 1.9 >>1.4 ?Intake / Output; MIVF: no MIVF ?GI Imaging: ?3/19 CT abd: Anterior antral gastric wall perforation with associated moderate volume pneumoperitoneum, small volume ascites, extravasated PO contrast. Colonic diverticulosis & Chronic pancreatitis. ?3/16 CT abd/pelvis:  Decreased pneumoperitoneum, Phillip Heal patch repair of the perforated ?distal stomach, postoperative drain at the greater omentum. Small volume mostly simple density fluid in the abdomen is primarily around the liver. No bowel obstruction. Chronic calcific pancreatitis ?3/27 Abd Xray A few bubbly lucencies are seen in the left upper quadrant which are decreased compared with prior exam, likely due to resolving ?postsurgical air ? ?GI Surgeries / Procedures:  ?03/15 graham patch repair  ?03/19 exploratory laparotomy, repeat ulcer graham patch repair and G and J tube placement  ?03/28 drain placed by IR ? ?Central access: 09/18/21 ?TPN start date: 09/18/21 ? ?Nutritional Goals: ?Goal TPN rate is 65 mL/hr (provides 90.1 g of protein and 1676.7 kcals per day) ? ?RD Assessment: ?Estimated Needs ?Total Energy Estimated Needs: 1600-1800kcal/day ?Total Protein Estimated Needs: 80-90g/day ?Total Fluid Estimated Needs: 1.6-1.8L/day ? ?Current Nutrition:  ?NPO ? ?Plan:  ?Continue TPN at goal rate of 65 mL/hr at 1800 (total volume including overfill 1660 mL) ?Nutritional  Components ?Amino acids (using 10% Travasol): 90.1 grams ?Dextrose: 249.6 grams ?Lipids (using 20% SMOFlipids): 46.8 grams ?Electrolytes in TPN: Na 92mq/L, K 481m/L, Ca 38m63mL, Mg 138m73m, and Phos 138mm438m. Cl:Ac 1:2 ?Add standard MVI, thiamine 100 mg and 1/2 trace elements to TPN ?Continue Sensitive SSI to q6h and adjust as needed  ?Monitor TPN labs on Mon/Thurs ? ?RodneDallie Piles0/2023,7:34 AM ? ?

## 2021-09-28 NOTE — Progress Notes (Signed)
Physical Therapy Treatment ?Patient Details ?Name: Maria [name] ?MRN: [id number] ?DOB: 10/08/1970 ?Today's Date: 09/28/2021 ? ? ?History of Present Illness Pt is a 51 year old female admitted on on 09/13/2021 under general surgery due to pneumoperitoneum secondary to perforated peptic ulcer, patient was taken to the OR s/p ex lap with primary closure of perforated peptic ulcer with Phillip Heal patch repair with intra-abdominal abscess drainage.  Postop patient became unresponsive so code stroke was called but work-up was negative. Patient underwent another exploratory laparotomy on 09/17/2021 when peptic ulcer perforation repair was redone along with G and J-tube placement, intra-abdominal abscess was also drained. TPN started 09/18/21. ? ?  ?PT Comments  ? ? Pt was long sitting in bed upon arriving. She agrees to session and remains very pleasant and cooperative. Easily able to exit bed, stand, and ambulate with pushing IV pole only. Pt does endorse fatigue after walking and requested pain medication due to increase in pain. Pt is overall doing well from a PT standpoint. Updated recs to outpatient PT post acute admission.  ?  ?Recommendations for follow up therapy are one component of a multi-disciplinary discharge planning process, led by the attending physician.  Recommendations may be updated based on patient status, additional functional criteria and insurance authorization. ? ?Follow Up Recommendations ? Outpatient PT ?  ?  ?Assistance Recommended at Discharge PRN  ?Patient can return home with the following A little help with walking and/or transfers;A little help with bathing/dressing/bathroom;Help with stairs or ramp for entrance ?  ?Equipment Recommendations ? Rolling walker (2 wheels)  ?  ?   ?Precautions / Restrictions Precautions ?Precautions: Fall ?Precaution Comments: ng tube, jp drains ?Restrictions ?Weight Bearing Restrictions: No  ?  ? ?Mobility ? Bed Mobility ?Overal bed mobility: Needs Assistance ?Bed  Mobility: Supine to Sit, Sit to Supine ?Supine to sit: Supervision, HOB elevated ?Sit to supine: Supervision, HOB elevated ?  ?  ?  ? ?Transfers ?Overall transfer level: Needs assistance ?Equipment used: None ?Transfers: Sit to/from Stand ?Sit to Stand: Supervision ?  ?Ambulation/Gait ?Ambulation/Gait assistance: Supervision ?Gait Distance (Feet): 600 Feet ?Assistive device: IV Pole ?Gait Pattern/deviations: Step-to pattern ?Gait velocity: decreased ?  ?  ?General Gait Details: pt was able to ambulate without LOB or safety concern. she ambulated throughout hospital ~ 600 ft. does endorse fatigue afterwards. ? ?  ?Balance Overall balance assessment: Needs assistance ?Sitting-balance support: No upper extremity supported, Feet supported ?Sitting balance-Leahy Scale: Good ?  ?  ?Standing balance support: Single extremity supported, During functional activity ?Standing balance-Leahy Scale: Good ?  ?  ?  ?Cognition Arousal/Alertness: Awake/alert ?Behavior During Therapy: [facility] for tasks assessed/performed ?Overall Cognitive Status: Within Functional Limits for tasks assessed ?  ?  ?General Comments: alert and oriented x4 ?  ?  ? ?  ? ? ?PT Goals (current goals can now be found in the care plan section) Acute Rehab PT Goals ?Patient Stated Goal: to get better and go home. ?Progress towards PT goals: Progressing toward goals ? ?  ?Frequency ? ? ? Min 2X/week ? ? ? ?  ?PT Plan Discharge plan needs to be updated  ? ? ?   ?AM-PAC PT "6 Clicks" Mobility   ?Outcome Measure ? Help needed turning from your back to your side while in a flat bed without using bedrails?: A Little ?Help needed moving from lying on your back to sitting on the side of a flat bed without using bedrails?: A Little ?Help needed moving to and from a bed  to a chair (including a wheelchair)?: A Little ?Help needed standing up from a chair using your arms (e.g., wheelchair or bedside chair)?: A Little ?Help needed to walk in hospital room?: A Little ?Help  needed climbing 3-5 steps with a railing? : A Little ?6 Click Score: 18 ? ?  ?End of Session Equipment Utilized During Treatment: Gait belt ?Activity Tolerance: Patient tolerated treatment well ?Patient left: in bed;with bed alarm set;with family/visitor present ?Nurse Communication: Mobility status ?PT Visit Diagnosis: Unsteadiness on feet (R26.81);Muscle weakness (generalized) (M62.81);Difficulty in walking, not elsewhere classified (R26.2) ?  ? ? ?Time: 3779-3968 ?PT Time Calculation (min) (ACUTE ONLY): 18 min ? ?Charges:  $Gait Training: 8-22 mins          ?          ?Julaine Fusi PTA ?09/28/21, 12:34 PM  ? ?

## 2021-09-29 ENCOUNTER — Inpatient Hospital Stay: Payer: BC Managed Care – PPO | Admitting: Radiology

## 2021-09-29 ENCOUNTER — Other Ambulatory Visit: Payer: Self-pay | Admitting: Family

## 2021-09-29 DIAGNOSIS — R12 Heartburn: Secondary | ICD-10-CM

## 2021-09-29 HISTORY — PX: IR CM INJ ANY COLONIC TUBE W/FLUORO: IMG2336

## 2021-09-29 LAB — GLUCOSE, CAPILLARY
Glucose-Capillary: 134 mg/dL — ABNORMAL HIGH (ref 70–99)
Glucose-Capillary: 134 mg/dL — ABNORMAL HIGH (ref 70–99)
Glucose-Capillary: 134 mg/dL — ABNORMAL HIGH (ref 70–99)
Glucose-Capillary: 135 mg/dL — ABNORMAL HIGH (ref 70–99)
Glucose-Capillary: 144 mg/dL — ABNORMAL HIGH (ref 70–99)

## 2021-09-29 LAB — BASIC METABOLIC PANEL
Anion gap: 8 (ref 5–15)
BUN: 25 mg/dL — ABNORMAL HIGH (ref 6–20)
CO2: 22 mmol/L (ref 22–32)
Calcium: 9 mg/dL (ref 8.9–10.3)
Chloride: 106 mmol/L (ref 98–111)
Creatinine, Ser: 0.74 mg/dL (ref 0.44–1.00)
GFR, Estimated: >60 mL/min (ref >60)
Glucose, Bld: 136 mg/dL — ABNORMAL HIGH (ref 70–99)
Potassium: 4 mmol/L (ref 3.5–5.1)
Sodium: 136 mmol/L (ref 135–145)

## 2021-09-29 LAB — CBC
HCT: 29.6 % — ABNORMAL LOW (ref 36.0–46.0)
Hemoglobin: 9.5 g/dL — ABNORMAL LOW (ref 12.0–15.0)
MCH: 32.1 pg (ref 26.0–34.0)
MCHC: 32.1 g/dL (ref 30.0–36.0)
MCV: 100 fL (ref 80.0–100.0)
Platelets: 176 10*3/uL (ref 150–400)
RBC: 2.96 MIL/uL — ABNORMAL LOW (ref 3.87–5.11)
RDW: 15.7 % — ABNORMAL HIGH (ref 11.5–15.5)
WBC: 10 10*3/uL (ref 4.0–10.5)
nRBC: 0 % (ref 0.0–0.2)

## 2021-09-29 LAB — MAGNESIUM: Magnesium: 2 mg/dL (ref 1.7–2.4)

## 2021-09-29 MED ORDER — IOHEXOL 350 MG/ML SOLN
5.0000 mL | Freq: Once | INTRAVENOUS | Status: AC | PRN
  Administered 2021-09-29: 5 mL

## 2021-09-29 MED ORDER — NICOTINE 14 MG/24HR TD PT24
14.0000 mg | MEDICATED_PATCH | Freq: Every day | TRANSDERMAL | Status: DC
  Administered 2021-10-02 – 2021-10-10 (×9): 14 mg via TRANSDERMAL
  Filled 2021-09-29 (×11): qty 1

## 2021-09-29 MED ORDER — CHLORHEXIDINE GLUCONATE CLOTH 2 % EX PADS
6.0000 | MEDICATED_PAD | Freq: Every day | CUTANEOUS | Status: DC
  Administered 2021-09-29 – 2021-10-11 (×12): 6 via TOPICAL

## 2021-09-29 MED ORDER — TRAVASOL 10 % IV SOLN
INTRAVENOUS | Status: AC
  Filled 2021-09-29: qty 900.1

## 2021-09-29 NOTE — TOC Progression Note (Signed)
Transition of Care (TOC) - Progression Note  ? ? ?Patient Details  ?Name: Renelle Stegenga ?MRN: [id number] ?Date of Birth: 12-03-1970 ? ?Transition of Care (TOC) CM/SW Contact  ?Beverly Sessions, RN ?Phone Number: ?09/29/2021, 2:31 PM ? ?Clinical Narrative:    ? ?Patient remains on TPN, PT recommendations have been upgraded to outpatient PT ? ?Expected Discharge Plan: Home/Self Care ?Barriers to Discharge: Continued Medical Work up ? ?Expected Discharge Plan and Services ?Expected Discharge Plan: Home/Self Care ?  ?Discharge Planning Services: CM Consult ?  ?Living arrangements for the past 2 months: [place] ?                ?DME Arranged: N/A ?DME Agency: NA ?  ?  ?  ?HH Arranged: NA ?[place] Agency: NA ?  ?  ?  ? ? ?Social Determinants of Health (SDOH) Interventions ?  ? ?Readmission Risk Interventions ?   ? View : No data to display.  ?  ?  ?  ? ? ?

## 2021-09-29 NOTE — Progress Notes (Signed)
Subjective:  ?CC: ?Maria [name] is a 51 y.o. female  Hospital stay day 15, 12 Days Post-Op exploratory laparotomy, redo peptic ulcer perforation repair, G and J tube placement. ? ?HPI: ?No acute issues overnight reported.  G tube to gravity. ? ?ROS:  ?General: Denies weight loss, weight gain, fatigue, fevers, chills, and night sweats. ?Heart: Denies chest pain, palpitations, racing heart, irregular heartbeat, leg pain or swelling, and decreased activity tolerance. ?Respiratory: Denies breathing difficulty, shortness of breath, wheezing, cough, and sputum. ?GI: Denies change in appetite, heartburn, nausea, vomiting, constipation, diarrhea, and blood in stool. ?GU: Denies difficulty urinating, pain with urinating, urgency, frequency, blood in urine. ? ? ?Objective:  ? ?Temp:  [98 ?F (36.7 ?C)-99.3 ?F (37.4 ?C)] 98.5 ?F (36.9 ?C) (03/31 0830) ?Pulse Rate:  [94-105] 94 (03/31 0830) ?Resp:  [20] 20 (03/31 0401) ?BP: (117-140)/(58-65) 117/58 (03/31 0830) ?SpO2:  [97 %-100 %] 100 % (03/31 0830)     Height: '5\' 2"'$  (157.5 cm) Weight: 52.9 kg BMI (Calculated): 21.33  ? ?Intake/Output this shift:  ? ?Intake/Output Summary (Last 24 hours) at 09/29/2021 1232 ?Last data filed at 09/29/2021 4098 ?Gross per 24 hour  ?Intake 928.85 ml  ?Output 25 ml  ?Net 903.85 ml  ? ? ?Constitutional :  alert, cooperative, appears stated age, and no distress  ?Respiratory:  clear to auscultation bilaterally  ?Cardiovascular:  regular rate and rhythm  ?Gastrointestinal: Soft, no guarding, JP with scant drainage. IR drain with old bloody drainage. G tube with bilious output. J tube remains clamped  ?Skin: Cool and moist. Staples c/d/i  ?Psychiatric: Normal affect, non-agitated, not confused  ?   ?  ?LABS:  ? ?  Latest Ref Rng & Units 09/29/2021  ?  6:30 AM 09/28/2021  ?  5:30 AM 09/25/2021  ?  4:57 AM  ?CMP  ?Glucose 70 - 99 mg/dL 136   137   133    ?BUN 6 - 20 mg/dL '25   22   27    '$ ?Creatinine 0.44 - 1.00 mg/dL 0.74   0.79   0.83    ?Sodium 135 - 145  mmol/L 136   134   137    ?Potassium 3.5 - 5.1 mmol/L 4.0   4.1   4.5    ?Chloride 98 - 111 mmol/L 106   106   107    ?CO2 22 - 32 mmol/L '22   22   22    '$ ?Calcium 8.9 - 10.3 mg/dL 9.0   8.6   8.7    ?Total Protein 6.5 - 8.1 g/dL  7.2   6.4    ?Total Bilirubin 0.3 - 1.2 mg/dL  1.4   1.9    ?Alkaline Phos 38 - 126 U/L  198   209    ?AST 15 - 41 U/L  22   22    ?ALT 0 - 44 U/L  15   17    ? ? ?  Latest Ref Rng & Units 09/29/2021  ?  6:30 AM 09/28/2021  ?  3:41 AM 09/27/2021  ?  3:42 AM  ?CBC  ?WBC 4.0 - 10.5 K/uL 10.0   9.1   9.6    ?Hemoglobin 12.0 - 15.0 g/dL 9.5   8.8   9.0    ?Hematocrit 36.0 - 46.0 % 29.6   28.4   29.1    ?Platelets 150 - 400 K/uL 176   149   145    ? ? ?RADS: ? ?Assessment:  ? ?S/p  exploratory laparotomy, redo peptic ulcer perforation repair, G and J tube placement, due to persistent leak after intial graham patch repair ? ? Stable with gtube to gravity.  Will try to transition to tube feeds via J tube after confirming J tube still in proper location.  Report of some issues with capping it reported by family member, so will proceed with IR tube study. ? ?If tube feeds started, can start weaning TPN as long as she is tolerating tube feeds. ? ? ?Due to persistent leak, will continue with bowel rest, TPN, IV ABX and antifungal for intra-abdominal infection coverage for another week (end date 10/03/21) ? ?[facility] recommended continuing current management, no additional surgical interventions or care available nor recommended at this time.  Will reach out to [facility] next to confirm no additional treatments available.  ? ?labs/images/medications/previous chart entries reviewed personally and relevant changes/updates noted above. ? ? ?

## 2021-09-29 NOTE — Plan of Care (Signed)
?  Problem: Clinical Measurements: ?Goal: Will remain free from infection ?Outcome: Progressing ?  ?Problem: Nutrition: ?Goal: Adequate nutrition will be maintained ?Outcome: Progressing ?  ?Problem: Coping: ?Goal: Level of anxiety will decrease ?Outcome: Progressing ?  ?Problem: Pain Managment: ?Goal: General experience of comfort will improve ?Outcome: Progressing ?  ?

## 2021-09-29 NOTE — Progress Notes (Signed)
Physical Therapy Treatment ?Patient Details ?Name: Maria [name] ?MRN: [id number] ?DOB: [birth year] ?Today's Date: 09/29/2021 ? ? ?History of Present Illness Pt is a 51 year old female admitted on on 09/13/2021 under general surgery due to pneumoperitoneum secondary to perforated peptic ulcer, patient was taken to the OR s/p ex lap with primary closure of perforated peptic ulcer with Phillip Heal patch repair with intra-abdominal abscess drainage.  Postop patient became unresponsive so code stroke was called but work-up was negative. Patient underwent another exploratory laparotomy on 09/17/2021 when peptic ulcer perforation repair was redone along with G and J-tube placement, intra-abdominal abscess was also drained. TPN started 09/18/21. ? ?  ?PT Comments  ? ? Pt was long sitting in bed awake and oriented. Supportive mother at bedside. She is agreeable to session and cooperative throughout. Easily and safely able to exit bed, stand, and ambulate without supervision. Ambulated pushing IV pole without safety concern. Will have pt perform stair training once able. Unable currently due to medications/ nutrition running. Outpatient PT recommended at DC to advance pt to PLOF.  ?  ?Recommendations for follow up therapy are one component of a multi-disciplinary discharge planning process, led by the attending physician.  Recommendations may be updated based on patient status, additional functional criteria and insurance authorization. ? ?Follow Up Recommendations ? Outpatient PT ?  ?  ?Assistance Recommended at Discharge PRN  ?Patient can return home with the following A little help with walking and/or transfers;A little help with bathing/dressing/bathroom;Help with stairs or ramp for entrance ?  ?Equipment Recommendations ? None recommended by PT  ?  ?   ?Precautions / Restrictions Precautions ?Precautions: Fall ?Precaution Comments: ng tube, jp drains ?Restrictions ?Weight Bearing Restrictions: No  ?  ? ?Mobility ? Bed  Mobility ?Overal bed mobility: Needs Assistance ?Bed Mobility: Supine to Sit, Sit to Supine ?  ?  ?Supine to sit: Supervision, HOB elevated ?Sit to supine: Supervision, HOB elevated ?  ?  ?  ? ?Transfers ?Overall transfer level: Needs assistance ?Equipment used: None ?Transfers: Sit to/from Stand ?Sit to Stand: Supervision ?  ?  ?  ?  ?  ?  ?  ? ?Ambulation/Gait ?Ambulation/Gait assistance: Supervision ?Gait Distance (Feet): 600 Feet ?Assistive device: IV Pole ?Gait Pattern/deviations: Step-through pattern, WFL(Within Functional Limits) ?  ?  ?  ?General Gait Details: pt continues to demonstrate safe ambulation without AD. did push IV pole but is not reliant on it for balance ? ?  ?Balance Overall balance assessment: Needs assistance ?Sitting-balance support: No upper extremity supported, Feet supported ?Sitting balance-Leahy Scale: Good ?  ?  ?Standing balance support: Single extremity supported, During functional activity ?Standing balance-Leahy Scale: Good ?  ?   ?Cognition Arousal/Alertness: Awake/alert ?Behavior During Therapy: [facility] for tasks assessed/performed ?Overall Cognitive Status: Within Functional Limits for tasks assessed ?  ?   ?General Comments: alert and oriented x4 ?  ?  ? ?  ?   ?   ? ?Pertinent Vitals/Pain Pain Assessment ?Pain Assessment: 0-10 ?Pain Score: 4  ?Faces Pain Scale: Hurts little more ?Pain Location: abdomen ?Pain Descriptors / Indicators: Discomfort ?Pain Intervention(s): Limited activity within patient's tolerance, Monitored during session, Repositioned  ? ? ? ?PT Goals (current goals can now be found in the care plan section) Acute Rehab PT Goals ?Patient Stated Goal: to get better and go home. ?Progress towards PT goals: Progressing toward goals ? ?  ?Frequency ? ? ? Min 2X/week ? ? ? ?  ?PT Plan Current plan remains appropriate  ? ? ?   ?  AM-PAC PT "6 Clicks" Mobility   ?Outcome Measure ? Help needed turning from your back to your side while in a flat bed without using bedrails?: A  Little ?Help needed moving from lying on your back to sitting on the side of a flat bed without using bedrails?: A Little ?Help needed moving to and from a bed to a chair (including a wheelchair)?: A Little ?Help needed standing up from a chair using your arms (e.g., wheelchair or bedside chair)?: A Little ?Help needed to walk in hospital room?: A Little ?Help needed climbing 3-5 steps with a railing? : A Little ?6 Click Score: 18 ? ?  ?End of Session   ?Activity Tolerance: Patient tolerated treatment well ?Patient left: in bed;with bed alarm set;with family/visitor present ?Nurse Communication: Mobility status ?PT Visit Diagnosis: Unsteadiness on feet (R26.81);Muscle weakness (generalized) (M62.81);Difficulty in walking, not elsewhere classified (R26.2) ?  ? ? ?Time: 4503-8882 ?PT Time Calculation (min) (ACUTE ONLY): 13 min ? ?Charges:  $Gait Training: 8-22 mins          ?          ? ?Julaine Fusi PTA ?09/29/21, 8:54 AM  ? ?

## 2021-09-29 NOTE — Consult Note (Addendum)
PHARMACY - TOTAL PARENTERAL NUTRITION CONSULT NOTE  ? ?Indication: Prolonged ileus ? ?Patient Measurements: ?Height: '5\' 2"'  (157.5 cm) ?Weight: 52.9 kg (116 lb 10 oz) ?IBW/kg (Calculated) : 50.1 ?TPN AdjBW (KG): 53.5 ?Body mass index is 21.33 kg/m?. ?Usual Weight: 55kg ? ?Assessment: [name] s/p [place] of non-healing perforated peptic ulcer c/b prolonged ileus. GJ tube placed 3/19 and Pharmacy consulted for mgmt of TPN and electrolytes. ? ?Glucose / Insulin: BG <180 ?--3 units SSI required past 24 hours ?Electrolytes: WNL ?Renal: Scr <1, stable ?Hepatic: Alk Phos 209>>198, T bili 1.9 >>1.4 ?Intake / Output; MIVF: no MIVF ?GI Imaging: ?3/19 CT abd: Anterior antral gastric wall perforation with associated moderate volume pneumoperitoneum, small volume ascites, extravasated PO contrast. Colonic diverticulosis & Chronic pancreatitis. ?3/16 CT abd/pelvis:  Decreased pneumoperitoneum, Phillip Heal patch repair of the perforated ?distal stomach, postoperative drain at the greater omentum. Small volume mostly simple density fluid in the abdomen is primarily around the liver. No bowel obstruction. Chronic calcific pancreatitis ?3/27 Abd Xray A few bubbly lucencies are seen in the left upper quadrant which are decreased compared with prior exam, likely due to resolving ?postsurgical air ? ?GI Surgeries / Procedures:  ?03/15 graham patch repair  ?03/19 exploratory laparotomy, repeat ulcer graham patch repair and G and J tube placement  ?03/28 drain placed by IR ? ?Central access: 09/18/21 ?TPN start date: 09/18/21 ? ?Nutritional Goals: ?Goal TPN rate is 65 mL/hr (provides 90.1 g of protein and 1676.7 kcals per day) ? ?RD Assessment: ?Estimated Needs ?Total Energy Estimated Needs: 1600-1800kcal/day ?Total Protein Estimated Needs: 80-90g/day ?Total Fluid Estimated Needs: 1.6-1.8L/day ? ?Current Nutrition:  ?NPO ? ?Plan:  ?Continue TPN at goal rate of 65 mL/hr (total volume including overfill 1660 mL) ?Nutritional  Components ?Amino acids (using 10% Travasol): 90.1 grams ?Dextrose: 249.6 grams ?Lipids (using 20% SMOFlipids): 46.8 grams ?Electrolytes in TPN: Na 74mq/L, K 4246m/L, Ca 46m646mL, Mg 146m446m, and Phos 146mm74m. Cl:Ac 1:2 ?Add standard MVI, thiamine 100 mg and 1/2 trace elements to TPN ?Continue Sensitive SSI to q6h and adjust as needed  ?Monitor TPN labs on Mon/Thurs ? ?RodneDallie Piles1/2023,7:29 AM ? ?

## 2021-09-29 NOTE — Progress Notes (Signed)
Nutrition Follow-up ? ?DOCUMENTATION CODES:  ? ?Non-severe (moderate) malnutrition in context of social or environmental circumstances ? ?INTERVENTION:  ? ?Continue TPN per pharmacy- currently at goal rate  ?  ?Daily weights  ?  ?Once appropriate for tube feeds, recommend: ?  ?Osmolite 1.5'@50ml' /hr- Initiate at 66m/hr and increase by 169mhr q 8 hours until goal rate is reached.  ?  ?Pro-Source 4549maily via tube, provides 40kcal and 11g of protein per serving  ?  ?Free water flushes 23m60m hours to maintain tube patency  ?  ?Regimen provides 1840kcal/day, 86g/day protein and 1094ml60m of free water  ?  ?Supplement vitamin D once pt can resume enteral nutrition  ? ?NUTRITION DIAGNOSIS:  ? 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 ? ?Ongoing ? ?GOAL:  ? ?Patient will meet greater than or equal to 90% of their needs ? ?Met with TPN ? ?MONITOR:  ? ?Diet advancement, Labs, Weight trends, Skin, I & O's ? ?REASON FOR ASSESSMENT:  ? ?Malnutrition Screening Tool ?  ? ?ASSESSMENT:  ? ?51 y/77female with h/o GERD, anxiety, depression, etoh abuse and HTN who is admitted with perforated duodenal ulcer s/p Graham patch repair 3/15. ? ?3/27- CT shows persistent leak ?3/28- s/p drain for perihepatic abscess ? ?Reviewed I/O's: +1.4 L x 24 hours and +13.9 L since 09/15/21 ? ?Drain output: 25 ml x 24 hours  ? ?Per MD notes, considering starting enteral nutrition today. Case discussed with MD; pt awaiting IR study to confirm tube is in proper position. If so, will plans to start TF and start TPN ? ?Pt remains on TPN at goal rate at 65 ml/hr, which provides 1676 kcals and 90 grams protein, meeting 100% of needs.  ? ?Medications reviewed and include folic acid.  ? ?Labs reviewed: CBGS: 105-144 (inpatient orders for glycemic control are 0-9 units insulin aspart every 6 hours).    ? ?Diet Order:   ?Diet Order   ? ?       ?  Diet NPO time specified  Diet effective now       ?  ? ?  ?  ? ?  ? ? ?EDUCATION NEEDS:  ? ?No education needs have been identified at this time ? ?Skin:  Skin Assessment: Skin Integrity Issues: ?Skin Integrity Issues:: Incisions ?Incisions: closed abdomen ? ?Last BM:  09/24/21 ? ?Height:  ? ?Ht Readings from Last 1 Encounters:  ?09/14/21 '5\' 2"'  (1.575 m)  ? ? ?Weight:  ? ?Wt Readings from Last 1 Encounters:  ?09/25/21 52.9 kg  ? ? ?Ideal Body Weight:  50 kg ? ?BMI:  Body mass index is 21.33 kg/m?. ? ?Estimated Nutritional Needs:  ? ?Kcal:  1600-1800kcal/day ? ?Protein:  80-90g/day ? ?Fluid:  1.6-1.8L/day ? ? ? ?JenifLoistine Chance LDN, CDCES ?Registered Dietitian II ?Certified Diabetes Care and Education Specialist ?Please refer to [facility] and/or RD on-call/weekend/after hours pager  ?

## 2021-09-29 NOTE — Progress Notes (Signed)
Occupational Therapy Treatment ?Patient Details ?Name: Maria [name] ?MRN: [id number] ?DOB: 10-19-1970 ?Today's Date: 09/29/2021 ? ? ?History of present illness Pt is a 51 year old female admitted on on 09/13/2021 under general surgery due to pneumoperitoneum secondary to perforated peptic ulcer, patient was taken to the OR s/p ex lap with primary closure of perforated peptic ulcer with Phillip Heal patch repair with intra-abdominal abscess drainage.  Postop patient became unresponsive so code stroke was called but work-up was negative. Patient underwent another exploratory laparotomy on 09/17/2021 when peptic ulcer perforation repair was redone along with G and J-tube placement, intra-abdominal abscess was also drained. TPN started 09/18/21. ?  ?OT comments ? Chart reviewed, pt greeted in bed agreeable to OT tx session. Tx session targeted improving strenght/endurance in setting of ADL tasks, education re: energy conservation during ADL/IADL. Hand out provided with pt demonstrating good carry over throughout ADL tasks.  All ADL/mobility completed with supervision. Improved performance noted throughout. OT will continue to follow acutely.   ? ?Recommendations for follow up therapy are one component of a multi-disciplinary discharge planning process, led by the attending physician.  Recommendations may be updated based on patient status, additional functional criteria and insurance authorization. ?   ?Follow Up Recommendations ? Home health OT  ?  ?Assistance Recommended at Discharge Intermittent Supervision/Assistance  ?Patient can return home with the following ? A little help with walking and/or transfers;A little help with bathing/dressing/bathroom;Assistance with cooking/housework ?  ?Equipment Recommendations ? None recommended by OT  ?  ?Recommendations for Other Services   ? ?  ?Precautions / Restrictions Precautions ?Precautions: Fall ?Precaution Comments: ng tube, jp drains ?Restrictions ?Weight Bearing Restrictions:  No  ? ? ?  ? ?Mobility Bed Mobility ?Overal bed mobility: Needs Assistance ?Bed Mobility: Supine to Sit, Sit to Supine ?  ?  ?Supine to sit: Supervision, HOB elevated ?Sit to supine: Supervision, HOB elevated ?  ?  ?  ? ?Transfers ?Overall transfer level: Needs assistance ?Equipment used: None ?Transfers: Sit to/from Stand ?Sit to Stand: Supervision ?  ?  ?  ?  ?  ?  ?  ?  ?Balance Overall balance assessment: Needs assistance ?Sitting-balance support: No upper extremity supported, Feet supported ?Sitting balance-Leahy Scale: Good ?  ?  ?Standing balance support: Single extremity supported, During functional activity ?Standing balance-Leahy Scale: Good ?  ?  ?  ?  ?  ?  ?  ?  ?  ?  ?  ?  ?   ? ?ADL either performed or assessed with clinical judgement  ? ?ADL Overall ADL's : Needs assistance/impaired ?  ?  ?  ?  ?  ?  ?  ?  ?  ?  ?  ?  ?  ?  ?  ?  ?  ?  ?  ?General ADL Comments: supervision standing at sink level grooming tasks, supervision for LB dressing; close supervision household distances in room with IV pole ?  ? ?Extremity/Trunk Assessment   ?  ?  ?  ?  ?  ? ?Vision   ?  ?  ?Perception   ?  ?Praxis   ?  ? ?Cognition Arousal/Alertness: Awake/alert ?Behavior During Therapy: [facility] for tasks assessed/performed ?Overall Cognitive Status: Within Functional Limits for tasks assessed ?  ?  ?  ?  ?  ?  ?  ?  ?  ?  ?  ?  ?  ?  ?  ?  ?  ?  ?  ?   ?  Exercises Other Exercises ?Other Exercises: edu re: energy conservation during ADL/IADLs ? ?  ?Shoulder Instructions   ? ? ?  ?General Comments    ? ? ?Pertinent Vitals/ Pain       Pain Assessment ?Pain Assessment: Faces ?Faces Pain Scale: Hurts a little bit ?Pain Location: abdomen ?Pain Descriptors / Indicators: Discomfort ?Pain Intervention(s): Limited activity within patient's tolerance, Monitored during session, Repositioned ? ?Home Living   ?  ?  ?  ?  ?  ?  ?  ?  ?  ?  ?  ?  ?  ?  ?  ?  ?  ?  ? ?  ?Prior Functioning/Environment    ?  ?  ?  ?   ? ?Frequency ? Min 2X/week   ? ? ? ? ?  ?Progress Toward Goals ? ?OT Goals(current goals can now be found in the care plan section) ? Progress towards OT goals: Progressing toward goals ? ?Acute Rehab OT Goals ?Patient Stated Goal: get stronger ?OT Goal Formulation: With patient ?Time For Goal Achievement: 10/13/21 ?Potential to Achieve Goals: Good  ?Plan Frequency remains appropriate;Discharge plan needs to be updated   ? ?Co-evaluation ? ? ?   ?  ?  ?  ?  ? ?  ?AM-PAC OT "6 Clicks" Daily Activity     ?Outcome Measure ? ? Help from another person eating meals?: Total ?Help from another person taking care of personal grooming?: None ?Help from another person toileting, which includes using toliet, bedpan, or urinal?: None ?Help from another person bathing (including washing, rinsing, drying)?: A Little ?Help from another person to put on and taking off regular upper body clothing?: None ?Help from another person to put on and taking off regular lower body clothing?: A Little ?6 Click Score: 19 ? ?  ?End of Session Equipment Utilized During Treatment: Other (comment) (IV pole) ? ?OT Visit Diagnosis: Unsteadiness on feet (R26.81);Muscle weakness (generalized) (M62.81) ?  ?Activity Tolerance Patient tolerated treatment well ?  ?Patient Left in bed;with call bell/phone within reach;with bed alarm set;with nursing/sitter in room ?  ?Nurse Communication Mobility status ?  ? ?   ? ?Time: 1224-8250 ?OT Time Calculation (min): 15 min ? ?Charges: OT General Charges ?$OT Visit: 1 Visit ?OT Treatments ?$Self Care/Home Management : 8-22 mins ?Shanon Payor, OTD OTR/L  ?09/29/21, 1:50 PM  ?

## 2021-09-30 LAB — GLUCOSE, CAPILLARY
Glucose-Capillary: 124 mg/dL — ABNORMAL HIGH (ref 70–99)
Glucose-Capillary: 145 mg/dL — ABNORMAL HIGH (ref 70–99)
Glucose-Capillary: 149 mg/dL — ABNORMAL HIGH (ref 70–99)

## 2021-09-30 LAB — CBC
HCT: 26.2 % — ABNORMAL LOW (ref 36.0–46.0)
Hemoglobin: 8.3 g/dL — ABNORMAL LOW (ref 12.0–15.0)
MCH: 31.7 pg (ref 26.0–34.0)
MCHC: 31.7 g/dL (ref 30.0–36.0)
MCV: 100 fL (ref 80.0–100.0)
Platelets: 168 10*3/uL (ref 150–400)
RBC: 2.62 MIL/uL — ABNORMAL LOW (ref 3.87–5.11)
RDW: 15.7 % — ABNORMAL HIGH (ref 11.5–15.5)
WBC: 8.5 10*3/uL (ref 4.0–10.5)
nRBC: 0 % (ref 0.0–0.2)

## 2021-09-30 MED ORDER — TRAVASOL 10 % IV SOLN
INTRAVENOUS | Status: AC
  Filled 2021-09-30: qty 900.1

## 2021-09-30 MED ORDER — VITAL 1.0 CAL PO LIQD
1000.0000 mL | ORAL | Status: DC
  Administered 2021-09-30 – 2021-10-01 (×2): 1000 mL
  Filled 2021-09-30: qty 1500

## 2021-09-30 MED ORDER — ACETAMINOPHEN 10 MG/ML IV SOLN
1000.0000 mg | Freq: Four times a day (QID) | INTRAVENOUS | Status: AC
  Administered 2021-09-30 – 2021-10-01 (×4): 1000 mg via INTRAVENOUS
  Filled 2021-09-30 (×4): qty 100

## 2021-09-30 MED ORDER — SODIUM CHLORIDE 0.9 % IV SOLN
500.0000 mg | Freq: Once | INTRAVENOUS | Status: AC
  Administered 2021-09-30: 500 mg via INTRAVENOUS
  Filled 2021-09-30: qty 2

## 2021-09-30 NOTE — Progress Notes (Signed)
Mobility Specialist - Progress Note ? ? 09/30/21 1500  ?Mobility  ?Activity Contraindicated/medical hold  ? ? ?Per RN pt having a headache at this moment, limiting mobility session. Will attempt at another date and time. ? ?Merrily Brittle ?Mobility Specialist ?09/30/21, 3:49 PM ? ? ? ? ?

## 2021-09-30 NOTE — Consult Note (Signed)
PHARMACY - TOTAL PARENTERAL NUTRITION CONSULT NOTE  ? ?Indication: Prolonged ileus ? ?Patient Measurements: ?Height: _0  (157.5 cm) ?Weight: 52.9 kg (116 lb 10 oz) ?IBW/kg (Calculated) : 50.1 ?TPN AdjBW (KG): 53.5 ?Body mass index is 21.33 kg/m?. ?Usual Weight: 55kg ? ?Assessment: [name] s/p Delford Field of non-healing perforated peptic ulcer c/b prolonged ileus. GJ tube placed 3/19 and Pharmacy consulted for mgmt of TPN and electrolytes. ? ?Glucose / Insulin: BG <180 ?-- 4 units SSI required past 24 hours ?Electrolytes: WNL ?Renal: Scr <1, stable ?Hepatic: Alk Phos 209>>198, T bili 1.9 >>1.4 ?Intake / Output; MIVF: no MIVF ?GI Imaging: ?3/19 CT abd: Anterior antral gastric wall perforation with associated moderate volume pneumoperitoneum, small volume ascites, extravasated PO contrast. Colonic diverticulosis & Chronic pancreatitis. ?3/16 CT abd/pelvis:  Decreased pneumoperitoneum, Phillip Heal patch repair of the perforated ?distal stomach, postoperative drain at the greater omentum. Small volume mostly simple density fluid in the abdomen is primarily around the liver. No bowel obstruction. Chronic calcific pancreatitis ?3/27 Abd Xray A few bubbly lucencies are seen in the left upper quadrant which are decreased compared with prior exam, likely due to resolving ?postsurgical air ? ?GI Surgeries / Procedures:  ?03/15 graham patch repair  ?03/19 exploratory laparotomy, repeat ulcer graham patch repair and G and J tube placement  ?03/28 drain placed by IR ? ?Central access: 09/18/21 ?TPN start date: 09/18/21 ? ?Nutritional Goals: ?Goal TPN rate is 65 mL/hr (provides 90.1 g of protein and 1676.7 kcals per day) ? ?RD Assessment: ?Estimated Needs ?Total Energy Estimated Needs: 1600-1800kcal/day ?Total Protein Estimated Needs: 80-90g/day ?Total Fluid Estimated Needs: 1.6-1.8L/day ? ?Current Nutrition:  ?NPO ? ?Plan:  ?Continue TPN at goal rate of 65 mL/hr (total volume including overfill 1660 mL) ?Nutritional  Components ?Amino acids (using 10% Travasol): 90.1 grams ?Dextrose: 249.6 grams ?Lipids (using 20% SMOFlipids): 46.8 grams ?Electrolytes in TPN: Na 136mq/L, K 477m/L, Ca 36m36mL, Mg 136m76m, and Phos 136mm86m. Cl:Ac 1:2 ?Add standard MVI, thiamine 100 mg and 1/2 trace elements to TPN ?Continue Sensitive SSI to q6h and adjust as needed  ?Monitor TPN labs on Mon/Thurs ? ?KishaOswald HillockrmD, BCPS ?09/30/2021,9:30 AM ? ?

## 2021-09-30 NOTE — Progress Notes (Signed)
CC: 12 Days Post-Op exploratory laparotomy, redo peptic ulcer perforation repair, G and J tube placement. ?Subjective: ?Feels okay today.  Her main complaint is headache.  She may think him is withdrawing from caffeine and nicotine. ?Jejunostomy study personally reviewed and showing adequate position of the tube.  Tube feeds to start early this morning. ?Otherwise doing okay.  No worsening abdominal pain ?Nml WBC and Nml Creatinine ? ?Objective: ?Vital signs in last 24 hours: ?Temp:  [98.1 ?F (36.7 ?C)-99 ?F (37.2 ?C)] 98.8 ?F (37.1 ?C) (04/01 4235) ?Pulse Rate:  [88-91] 88 (04/01 0852) ?Resp:  [16-17] 17 (04/01 3614) ?BP: (126-131)/(55-68) 126/62 (04/01 4315) ?SpO2:  [95 %-98 %] 98 % (04/01 0852) ?Last BM Date : 09/28/21 ? ?Intake/Output from previous day: ?03/31 0701 - 04/01 0700 ?In: 998.1 [I.V.:808.2; IV Piggyback:179.9] ?Out: 26 [Drains:75] ?Intake/Output this shift: ?Total I/O ?In: 979.8 [I.V.:929.8; IV Piggyback:50] ?Out: -  ? ?Physical exam: ? ?NAD alert ?Abd: soft, Jejunostomy tube in place, there is two JP w some old scant sanguinous output. G tube in place. No peritonitis. Staples intact, no infection ? ?Lab Results: ?CBC  ?Recent Labs  ?  09/29/21 ?0630 09/30/21 ?4008  ?WBC 10.0 8.5  ?HGB 9.5* 8.3*  ?HCT 29.6* 26.2*  ?PLT 176 168  ? ?BMET ?Recent Labs  ?  09/28/21 ?0530 09/29/21 ?0630  ?NA 134* 136  ?K 4.1 4.0  ?CL 106 106  ?CO2 22 22  ?GLUCOSE 137* 136*  ?BUN 22* 25*  ?CREATININE 0.79 0.74  ?CALCIUM 8.6* 9.0  ? ?PT/INR ?No results for input(s): LABPROT, INR in the last 72 hours. ?ABG ?No results for input(s): PHART, HCO3 in the last 72 hours. ? ?Invalid input(s): PCO2, PO2 ? ?Studies/Results: ?IR Cm Inj Any Colonic Tube W/Fluoro ? ?Result Date: 09/29/2021 ?CLINICAL DATA:  History of peptic ulcer perforation repair with gastrostomy and jejunostomy catheter placement. Confirmation of jejunostomy catheter placement requested before converting from TPN to small-bowel feeds. EXAM: CATHETER INJECTION UNDER  FLUOROSCOPY TECHNIQUE: The procedure, risks (including but not limited to bleeding, infection, organ damage), benefits, and alternatives were explained to the patient. Questions regarding the procedure were encouraged and answered. The patient understands and consents to the procedure. Survey fluoroscopic inspection reveals stable position of the jejunostomy catheter compared to previous CT 09/25/2021. Injection demonstrates contrast flowing easily through the tube into the lumen of the small bowel which is decompressed. No extravasation identified. IMPRESSION: 1. Good position and patency of jejunostomy catheter Electronically Signed   By: Lucrezia Europe M.D.   On: 09/29/2021 15:11   ? ?Anti-infectives: ?Anti-infectives (From admission, onward)  ? ? Start     Dose/Rate Route Frequency Ordered Stop  ? 09/20/21 1200  piperacillin-tazobactam (ZOSYN) IVPB 3.375 g       ? 3.375 g ?12.5 mL/hr over 240 Minutes Intravenous Every 8 hours 09/20/21 1023 10/03/21 2359  ? 09/18/21 2130  fluconazole (DIFLUCAN) IVPB 200 mg       ? 200 mg ?100 mL/hr over 60 Minutes Intravenous  Once 09/18/21 1816 09/18/21 2300  ? 09/17/21 1935  piperacillin-tazobactam (ZOSYN) 3.375 GM/50ML IVPB       ?Note to Pharmacy: Ronnell Freshwater D: cabinet override  ?    09/17/21 1935 09/17/21 2015  ? 09/15/21 1000  fluconazole (DIFLUCAN) IVPB 200 mg       ? 200 mg ?100 mL/hr over 60 Minutes Intravenous Every 24 hours 09/14/21 0821 10/03/21 2359  ? 09/14/21 1000  fluconazole (DIFLUCAN) IVPB 400 mg       ?  400 mg ?100 mL/hr over 120 Minutes Intravenous  Once 09/14/21 0802 09/14/21 1107  ? 09/14/21 0330  piperacillin-tazobactam (ZOSYN) IVPB 3.375 g       ? 3.375 g ?12.5 mL/hr over 240 Minutes Intravenous Every 8 hours 09/14/21 0244 09/19/21 0559  ? 09/13/21 2015  piperacillin-tazobactam (ZOSYN) IVPB 3.375 g       ? 3.375 g ?100 mL/hr over 30 Minutes Intravenous  Once 09/13/21 2009 09/13/21 2118  ? ?  ? ? ?Assessment/Plan: ?S/p exploratory laparotomy, redo peptic  ulcer perforation repair, G and J tube placement, due to persistent leak after intial graham patch repair ?Do iv acetaminophen and IV caffeine bolus x 1 ?Start TF today ?No worsening and no peritonitis ?No need for surgical intervention ?Due to persistent leak, will continue with bowel rest, TPN, IV ABX and antifungal for intra-abdominal infection coverage for another week (end date 10/03/21) ? ? ?Caroleen Hamman, MD, FACS ? ?09/30/2021 ? ? ? ?  ?

## 2021-10-01 LAB — CBC
HCT: 24.3 % — ABNORMAL LOW (ref 36.0–46.0)
Hemoglobin: 7.8 g/dL — ABNORMAL LOW (ref 12.0–15.0)
MCH: 31.7 pg (ref 26.0–34.0)
MCHC: 32.1 g/dL (ref 30.0–36.0)
MCV: 98.8 fL (ref 80.0–100.0)
Platelets: 163 10*3/uL (ref 150–400)
RBC: 2.46 MIL/uL — ABNORMAL LOW (ref 3.87–5.11)
RDW: 15.4 % (ref 11.5–15.5)
WBC: 6.5 10*3/uL (ref 4.0–10.5)
nRBC: 0 % (ref 0.0–0.2)

## 2021-10-01 LAB — GLUCOSE, CAPILLARY
Glucose-Capillary: 141 mg/dL — ABNORMAL HIGH (ref 70–99)
Glucose-Capillary: 132 mg/dL — ABNORMAL HIGH (ref 70–99)
Glucose-Capillary: 131 mg/dL — ABNORMAL HIGH (ref 70–99)
Glucose-Capillary: 134 mg/dL — ABNORMAL HIGH (ref 70–99)

## 2021-10-01 LAB — AEROBIC/ANAEROBIC CULTURE W GRAM STAIN (SURGICAL/DEEP WOUND)

## 2021-10-01 MED ORDER — ACETAMINOPHEN 10 MG/ML IV SOLN
1000.0000 mg | Freq: Four times a day (QID) | INTRAVENOUS | Status: AC
  Administered 2021-10-01 – 2021-10-02 (×4): 1000 mg via INTRAVENOUS
  Filled 2021-10-01 (×4): qty 100

## 2021-10-01 MED ORDER — TRAVASOL 10 % IV SOLN
INTRAVENOUS | Status: AC
  Filled 2021-10-01: qty 900.1

## 2021-10-01 NOTE — Consult Note (Signed)
PHARMACY - TOTAL PARENTERAL NUTRITION CONSULT NOTE  ? ?Indication: Prolonged ileus ? ?Patient Measurements: ?Height: '5\' 2"'  (157.5 cm) ?Weight: 52.9 kg (116 lb 10 oz) ?IBW/kg (Calculated) : 50.1 ?TPN AdjBW (KG): 53.5 ?Body mass index is 21.33 kg/m?. ?Usual Weight: 55kg ? ?Assessment: [name] s/p Delford Field of non-healing perforated peptic ulcer c/b prolonged ileus. GJ tube placed 3/19 and Pharmacy consulted for mgmt of TPN and electrolytes. Started TF 4/1.  ? ?Glucose / Insulin: BG 141 ?-- 4 units SSI required past 24 hours ?Electrolytes: WNL ?Renal: Scr <1, stable ?Hepatic: Alk Phos 209>>198, T bili 1.9 >>1.4 ?Intake / Output; MIVF: no MIVF ?GI Imaging: ?3/19 CT abd: Anterior antral gastric wall perforation with associated moderate volume pneumoperitoneum, small volume ascites, extravasated PO contrast. Colonic diverticulosis & Chronic pancreatitis. ?3/16 CT abd/pelvis:  Decreased pneumoperitoneum, Phillip Heal patch repair of the perforated ?distal stomach, postoperative drain at the greater omentum. Small volume mostly simple density fluid in the abdomen is primarily around the liver. No bowel obstruction. Chronic calcific pancreatitis ?3/27 Abd Xray A few bubbly lucencies are seen in the left upper quadrant which are decreased compared with prior exam, likely due to resolving ?postsurgical air ? ?GI Surgeries / Procedures:  ?03/15 graham patch repair  ?03/19 exploratory laparotomy, repeat ulcer graham patch repair and G and J tube placement  ?03/28 drain placed by IR ? ?Central access: 09/18/21 ?TPN start date: 09/18/21 ? ?Nutritional Goals: ?Goal TPN rate is 65 mL/hr (provides 90.1 g of protein and 1676.7 kcals per day) ? ?RD Assessment: ?Estimated Needs ?Total Energy Estimated Needs: 1600-1800kcal/day ?Total Protein Estimated Needs: 80-90g/day ?Total Fluid Estimated Needs: 1.6-1.8L/day ? ?Current Nutrition:  ?NPO ? ?Plan:  ?Continue TPN at goal rate of 65 mL/hr (total volume including overfill 1660 mL).  Started tube feed. After discussion with MD will continue TPN at current rate as pt may not be tolerating TF.  ?Nutritional Components ?Amino acids (using 10% Travasol): 90.1 grams ?Dextrose: 249.6 grams ?Lipids (using 20% SMOFlipids): 46.8 grams ?Electrolytes in TPN: Na 41mq/L, K 470m/L, Ca 31m66mL, Mg 131m22m, and Phos 131mm59m. Cl:Ac 1:2 ?Add standard MVI, thiamine 100 mg and 1/2 trace elements to TPN ?Continue Sensitive SSI to q6h and adjust as needed  ?Monitor TPN labs on Mon/Thurs ? ?KishaOswald HillockrmD, BCPS ?10/01/2021,9:34 AM ? ?

## 2021-10-01 NOTE — Progress Notes (Signed)
CC: 13 Days Post-Op exploratory laparotomy, redo peptic ulcer perforation repair, G and J tube  ?Subjective: ?Had some headache. No major abdominal issues. Chronic ache. Unable to tolerate TF went down to 10 cc but still felt full and asked to hold it ? Tylenol and caffeine IV helped ?Labs ok ?Objective: ?Vital signs in last 24 hours: ?Temp:  [97.9 ?F (36.6 ?C)-98 ?F (36.7 ?C)] 97.9 ?F (36.6 ?C) (04/02 4627) ?Pulse Rate:  [75-81] 75 (04/02 0743) ?Resp:  [16-18] 18 (04/02 0743) ?BP: (116-123)/(48-64) 123/60 (04/02 0743) ?SpO2:  [99 %-100 %] 99 % (04/02 0743) ?Last BM Date : 09/30/21 ? ?Intake/Output from previous day: ?04/01 0701 - 04/02 0700 ?In: 2660.8 [I.V.:1381; NG/GT:98; IV Piggyback:1176.8] ?Out: 20 [Drains:20] ?Intake/Output this shift: ?No intake/output data recorded. ? ?Physical exam: ?NAD alert ?Abd: soft, Jejunostomy tube in place, there is two JP w some old scant sanguinous output. G tube in place. No peritonitis. Staples intact, no infection ? ? ?Lab Results: ?CBC  ?Recent Labs  ?  09/30/21 ?0350 10/01/21 ?0543  ?WBC 8.5 6.5  ?HGB 8.3* 7.8*  ?HCT 26.2* 24.3*  ?PLT 168 163  ? ?BMET ?Recent Labs  ?  09/29/21 ?0630  ?NA 136  ?K 4.0  ?CL 106  ?CO2 22  ?GLUCOSE 136*  ?BUN 25*  ?CREATININE 0.74  ?CALCIUM 9.0  ? ?PT/INR ?No results for input(s): LABPROT, INR in the last 72 hours. ?ABG ?No results for input(s): PHART, HCO3 in the last 72 hours. ? ?Invalid input(s): PCO2, PO2 ? ?Studies/Results: ?IR Cm Inj Any Colonic Tube W/Fluoro ? ?Result Date: 09/29/2021 ?CLINICAL DATA:  History of peptic ulcer perforation repair with gastrostomy and jejunostomy catheter placement. Confirmation of jejunostomy catheter placement requested before converting from TPN to small-bowel feeds. EXAM: CATHETER INJECTION UNDER FLUOROSCOPY TECHNIQUE: The procedure, risks (including but not limited to bleeding, infection, organ damage), benefits, and alternatives were explained to the patient. Questions regarding the procedure were  encouraged and answered. The patient understands and consents to the procedure. Survey fluoroscopic inspection reveals stable position of the jejunostomy catheter compared to previous CT 09/25/2021. Injection demonstrates contrast flowing easily through the tube into the lumen of the small bowel which is decompressed. No extravasation identified. IMPRESSION: 1. Good position and patency of jejunostomy catheter Electronically Signed   By: Lucrezia Europe M.D.   On: 09/29/2021 15:11   ? ?Anti-infectives: ?Anti-infectives (From admission, onward)  ? ? Start     Dose/Rate Route Frequency Ordered Stop  ? 09/20/21 1200  piperacillin-tazobactam (ZOSYN) IVPB 3.375 g       ? 3.375 g ?12.5 mL/hr over 240 Minutes Intravenous Every 8 hours 09/20/21 1023 10/03/21 2359  ? 09/18/21 2130  fluconazole (DIFLUCAN) IVPB 200 mg       ? 200 mg ?100 mL/hr over 60 Minutes Intravenous  Once 09/18/21 1816 09/18/21 2300  ? 09/17/21 1935  piperacillin-tazobactam (ZOSYN) 3.375 GM/50ML IVPB       ?Note to Pharmacy: Ronnell Freshwater D: cabinet override  ?    09/17/21 1935 09/17/21 2015  ? 09/15/21 1000  fluconazole (DIFLUCAN) IVPB 200 mg       ? 200 mg ?100 mL/hr over 60 Minutes Intravenous Every 24 hours 09/14/21 0821 10/03/21 2359  ? 09/14/21 1000  fluconazole (DIFLUCAN) IVPB 400 mg       ? 400 mg ?100 mL/hr over 120 Minutes Intravenous  Once 09/14/21 0802 09/14/21 1107  ? 09/14/21 0330  piperacillin-tazobactam (ZOSYN) IVPB 3.375 g       ? 3.375  g ?12.5 mL/hr over 240 Minutes Intravenous Every 8 hours 09/14/21 0244 09/19/21 0559  ? 09/13/21 2015  piperacillin-tazobactam (ZOSYN) IVPB 3.375 g       ? 3.375 g ?100 mL/hr over 30 Minutes Intravenous  Once 09/13/21 2009 09/13/21 2118  ? ?  ? ? ?Assessment/Plan: ?S/p exploratory laparotomy, redo peptic ulcer perforation repair, G and J tube placement, due to persistent leak after intial graham patch repair ?Pt wishes to have holiday from TF today. WE will reattemtp tomorrow, for now tpn ?No worsening and no  peritonitis ?No need for surgical intervention ?Due to persistent leak, will continue with bowel rest, TPN, IV ABX and antifungal for intra-abdominal infection coverage for another week (end date 10/03/21) ?  ?  ? ? ?Caroleen Hamman, MD, FACS ? ?10/01/2021 ? ? ? ?  ?

## 2021-10-02 LAB — GLUCOSE, CAPILLARY
Glucose-Capillary: 118 mg/dL — ABNORMAL HIGH (ref 70–99)
Glucose-Capillary: 123 mg/dL — ABNORMAL HIGH (ref 70–99)
Glucose-Capillary: 141 mg/dL — ABNORMAL HIGH (ref 70–99)
Glucose-Capillary: 71 mg/dL (ref 70–99)

## 2021-10-02 LAB — COMPREHENSIVE METABOLIC PANEL
ALT: 13 U/L (ref 0–44)
AST: 19 U/L (ref 15–41)
Albumin: 2.1 g/dL — ABNORMAL LOW (ref 3.5–5.0)
Alkaline Phosphatase: 192 U/L — ABNORMAL HIGH (ref 38–126)
Anion gap: 4 — ABNORMAL LOW (ref 5–15)
BUN: 23 mg/dL — ABNORMAL HIGH (ref 6–20)
CO2: 24 mmol/L (ref 22–32)
Calcium: 8.7 mg/dL — ABNORMAL LOW (ref 8.9–10.3)
Chloride: 107 mmol/L (ref 98–111)
Creatinine, Ser: 0.72 mg/dL (ref 0.44–1.00)
GFR, Estimated: >60 mL/min (ref >60)
Glucose, Bld: 125 mg/dL — ABNORMAL HIGH (ref 70–99)
Potassium: 3.6 mmol/L (ref 3.5–5.1)
Sodium: 135 mmol/L (ref 135–145)
Total Bilirubin: 0.9 mg/dL (ref 0.3–1.2)
Total Protein: 7.1 g/dL (ref 6.5–8.1)

## 2021-10-02 LAB — MAGNESIUM
Magnesium: 2 mg/dL (ref 1.7–2.4)
Magnesium: 2 mg/dL (ref 1.7–2.4)
Magnesium: 1.9 mg/dL (ref 1.7–2.4)

## 2021-10-02 LAB — PHOSPHORUS
Phosphorus: 3.5 mg/dL (ref 2.5–4.6)
Phosphorus: 3.3 mg/dL (ref 2.5–4.6)
Phosphorus: 3.2 mg/dL (ref 2.5–4.6)

## 2021-10-02 LAB — CBC
HCT: 24.5 % — ABNORMAL LOW (ref 36.0–46.0)
Hemoglobin: 7.7 g/dL — ABNORMAL LOW (ref 12.0–15.0)
MCH: 31 pg (ref 26.0–34.0)
MCHC: 31.4 g/dL (ref 30.0–36.0)
MCV: 98.8 fL (ref 80.0–100.0)
Platelets: 173 10*3/uL (ref 150–400)
RBC: 2.48 MIL/uL — ABNORMAL LOW (ref 3.87–5.11)
RDW: 15.4 % (ref 11.5–15.5)
WBC: 5.9 10*3/uL (ref 4.0–10.5)
nRBC: 0 % (ref 0.0–0.2)

## 2021-10-02 LAB — TRIGLYCERIDES: Triglycerides: 76 mg/dL (ref <150)

## 2021-10-02 MED ORDER — FREE WATER
30.0000 mL | Status: DC
  Administered 2021-10-02 – 2021-10-09 (×42): 30 mL

## 2021-10-02 MED ORDER — VITAL 1.0 CAL PO LIQD
1000.0000 mL | ORAL | Status: DC
  Filled 2021-10-02: qty 1500

## 2021-10-02 MED ORDER — TRAVASOL 10 % IV SOLN
INTRAVENOUS | Status: AC
  Filled 2021-10-02: qty 900.1

## 2021-10-02 MED ORDER — PROSOURCE TF PO LIQD
45.0000 mL | Freq: Every day | ORAL | Status: DC
  Administered 2021-10-03 – 2021-10-09 (×7): 45 mL
  Filled 2021-10-02 (×7): qty 45

## 2021-10-02 MED ORDER — SODIUM CHLORIDE 0.9% FLUSH
5.0000 mL | Freq: Three times a day (TID) | INTRAVENOUS | Status: DC
  Administered 2021-10-02 – 2021-10-10 (×23): 5 mL

## 2021-10-02 MED ORDER — OSMOLITE 1.5 CAL PO LIQD
1000.0000 mL | ORAL | Status: DC
  Administered 2021-10-02 – 2021-10-03 (×2): 1000 mL

## 2021-10-02 MED ORDER — VITAL HIGH PROTEIN PO LIQD: 1000.0000 mL | ORAL | Status: DC

## 2021-10-02 NOTE — Progress Notes (Signed)
Physical Therapy Treatment ?Patient Details ?Name: Maria [name] ?MRN: [id number] ?DOB: 22-Mar-1971 ?Today's Date: 10/02/2021 ? ? ?History of Present Illness Pt is a 51 year old female admitted on on 09/13/2021 under general surgery due to pneumoperitoneum secondary to perforated peptic ulcer, patient was taken to the OR s/p ex lap with primary closure of perforated peptic ulcer with Phillip Heal patch repair with intra-abdominal abscess drainage.  Postop patient became unresponsive so code stroke was called but work-up was negative. Patient underwent another exploratory laparotomy on 09/17/2021 when peptic ulcer perforation repair was redone along with G and J-tube placement, intra-abdominal abscess was also drained. TPN started 09/18/21. ? ?  ?PT Comments  ? ? Pt resting in bed upon PT arrival; pt's mother present.  Pt reports not walking in hallway since Saturday (pt slept a lot on Sunday d/t not feeling well) but has been walking to the bathroom for toileting with staff assist.  Pt able to walk short distance in bathroom without UE support (pt steady and safe) but pt preferring to hold onto IV pole for support ambulating in hallway (pt able to ambulate 700 feet).  Deferred stairs trial d/t lines/drains/IV pole limitations.  Will continue to focus on strengthening and progressive functional mobility during hospitalization. ?  ?Recommendations for follow up therapy are one component of a multi-disciplinary discharge planning process, led by the attending physician.  Recommendations may be updated based on patient status, additional functional criteria and insurance authorization. ? ?Follow Up Recommendations ? Outpatient PT ?  ?  ?Assistance Recommended at Discharge PRN  ?Patient can return home with the following A little help with walking and/or transfers;A little help with bathing/dressing/bathroom;Help with stairs or ramp for entrance ?  ?Equipment Recommendations ? None recommended by PT  ?  ?Recommendations for Other  Services   ? ? ?  ?Precautions / Restrictions Precautions ?Precautions: Fall ?Precaution Comments: feeding tube, jp drains ?Restrictions ?Weight Bearing Restrictions: No  ?  ? ?Mobility ? Bed Mobility ?Overal bed mobility: Needs Assistance ?Bed Mobility: Supine to Sit, Sit to Supine ?  ?  ?Supine to sit: Supervision, HOB elevated ?Sit to supine: Supervision, HOB elevated ?  ?General bed mobility comments: SBA for lines ?  ? ?Transfers ?Overall transfer level: Needs assistance ?Equipment used: None ?Transfers: Sit to/from Stand ?Sit to Stand: Supervision ?  ?  ?  ?  ?  ?General transfer comment: steady transfers (from bed x1 trial and from toilet x1 trial) ?  ? ?Ambulation/Gait ?Ambulation/Gait assistance: Supervision ?Gait Distance (Feet): 700 Feet ?Assistive device: IV Pole ?Gait Pattern/deviations: Step-through pattern, WFL(Within Functional Limits) ?Gait velocity: decreased ?  ?  ?General Gait Details: steady safe ambulation with single UE support on IV pole; pt requesting to hold onto IV pole d/t feeling safer but pt was able to ambulate short distance in bathroom without UE support and no balance concerns noted ? ? ?Stairs ?  ?  ?  ?  ?  ? ? ?Wheelchair Mobility ?  ? ?Modified Rankin (Stroke Patients Only) ?  ? ? ?  ?Balance Overall balance assessment: Needs assistance ?Sitting-balance support: No upper extremity supported, Feet supported ?Sitting balance-Leahy Scale: Good ?Sitting balance - Comments: steady sitting reaching within BOS ?  ?Standing balance support: No upper extremity supported ?Standing balance-Leahy Scale: Good ?Standing balance comment: steady washing hands at sink ?  ?  ?  ?  ?  ?  ?  ?  ?  ?  ?  ?  ? ?  ?Cognition Arousal/Alertness:  Awake/alert ?Behavior During Therapy: [facility] for tasks assessed/performed ?Overall Cognitive Status: Within Functional Limits for tasks assessed ?  ?  ?  ?  ?  ?  ?  ?  ?  ?  ?  ?  ?  ?  ?  ?  ?General Comments: alert and oriented x4 ?  ?  ? ?  ?Exercises   ? ?   ?General Comments  Nursing cleared pt for participation in physical therapy.  Pt agreeable to PT session. ?  ?  ? ?Pertinent Vitals/Pain Pain Assessment ?Pain Assessment: Faces ?Faces Pain Scale: Hurts a little bit ?Pain Location: headache and tender abdomen ?Pain Descriptors / Indicators: Headache, Tender ?Pain Intervention(s): Limited activity within patient's tolerance, Monitored during session, Repositioned, Other (comment) (RN present end of session for pain meds) ?HR 98-102 bpm and O2 sats 98% or greater on room air during sessions activities.  ? ? ?Home Living   ?  ?  ?  ?  ?  ?  ?  ?  ?  ?   ?  ?Prior Function    ?  ?  ?   ? ?PT Goals (current goals can now be found in the care plan section) Acute Rehab PT Goals ?Patient Stated Goal: to get better and go home. ?PT Goal Formulation: With patient ?Time For Goal Achievement: 10/06/21 ?Potential to Achieve Goals: Good ?Progress towards PT goals: Progressing toward goals ? ?  ?Frequency ? ? ? Min 2X/week ? ? ? ?  ?PT Plan Current plan remains appropriate  ? ? ?Co-evaluation   ?  ?  ?  ?  ? ?  ?AM-PAC PT "6 Clicks" Mobility   ?Outcome Measure ? Help needed turning from your back to your side while in a flat bed without using bedrails?: None ?Help needed moving from lying on your back to sitting on the side of a flat bed without using bedrails?: A Little ?Help needed moving to and from a bed to a chair (including a wheelchair)?: A Little ?Help needed standing up from a chair using your arms (e.g., wheelchair or bedside chair)?: A Little ?Help needed to walk in hospital room?: A Little ?Help needed climbing 3-5 steps with a railing? : A Little ?6 Click Score: 19 ? ?  ?End of Session Equipment Utilized During Treatment: Gait belt ?Activity Tolerance: Patient tolerated treatment well ?Patient left: in bed;with family/visitor present;with call bell/phone within reach;with nursing/sitter in room ?Nurse Communication: Mobility status ?PT Visit Diagnosis: Unsteadiness on  feet (R26.81);Muscle weakness (generalized) (M62.81);Difficulty in walking, not elsewhere classified (R26.2) ?  ? ? ?Time: 3833-3832 ?PT Time Calculation (min) (ACUTE ONLY): 18 min ? ?Charges:  $Therapeutic Exercise: 8-22 mins          ?          ?Leitha Bleak, PT ?10/02/21, 2:43 PM ? ? ?

## 2021-10-02 NOTE — Progress Notes (Signed)
Nutrition Follow-up ? ?DOCUMENTATION CODES:  ? ?Non-severe (moderate) malnutrition in context of social or environmental circumstances ? ?INTERVENTION:  ? ?Continue TPN per pharmacy- currently at goal rate  ?  ?Daily weights  ?  ?Initiate TF via j-port: ?  ?Osmolite 1.5 @ 10 ml/hr- advance to goal rate of 50 ml/hr per MD discretion  ?  ?Pro-Source 17m daily via tube, provides 40kcal and 11g of protein per serving  ?  ?Free water flushes 376mq4 hours to maintain tube patency  ?  ?Regimen provides 1840kcal/day, 86g/day protein and [id number] of free water  ?  ?Supplement vitamin D once pt can resume enteral nutrition  ? ?NUTRITION DIAGNOSIS:  ? 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 ? ?Ongoing ? ?GOAL:  ? ?Patient will meet greater than or equal to 90% of their needs ? ?Met with TPN ? ?MONITOR:  ? ?Diet advancement, Labs, Weight trends, Skin, I & O's ? ?REASON FOR ASSESSMENT:  ? ?Malnutrition Screening Tool ?  ? ?ASSESSMENT:  ? ?[age]o female with h/o GERD, anxiety, depression, etoh abuse and HTN who is admitted with perforated duodenal ulcer s/p Graham patch repair 3/15. ? ?3/27- CT shows persistent leak ?3/28- s/p drain for perihepatic abscess ?4/1- TF initiated; j-tube study revealed proper positioning of tube  ?4/2- pt requesting TF holiday ? ?Reviewed I/O's: +1.3 L x 24 hours and +16.2 L since 09/18/21 ? ?Drain output: 40 ml x 24 hours ? ?Case discussed with MD, RN, and pharmacy. Pt did not tolerate TF, but will plan to resume TF at 10 ml/hr today. Per MD, plan to start TF at 10 ml/hr, but no plans for advancement today. Received permission to adjust TF formula as needed.  ? ?Pt remains on TPN at goal rate at 65 ml/hr, which provides 1676 kcals and 90 grams protein, meeting 100% of needs.  ? ?Labs reviewed: CBGS: 118-141 (inpatient orders for glycemic  control are 0-9 units inulin aspart every 6 hours).    ? ?Diet Order:   ?Diet Order   ? ?       ?  Diet NPO time specified  Diet effective now       ?  ? ?  ?  ? ?  ? ? ?EDUCATION NEEDS:  ? ?No education needs have been identified at this time ? ?Skin:  Skin Assessment: Skin Integrity Issues: ?Skin Integrity Issues:: Incisions ?Incisions: closed abdomen ? ?Last BM:  10/01/21 ? ?Height:  ? ?Ht Readings from Last 1 Encounters:  ?09/14/21 '5\' 2"'  (1.575 m)  ? ? ?Weight:  ? ?Wt Readings from Last 1 Encounters:  ?09/25/21 52.9 kg  ? ? ?Ideal Body Weight:  50 kg ? ?BMI:  Body mass index is 21.33 kg/m?. ? ?Estimated Nutritional Needs:  ? ?Kcal:  1600-1800kcal/day ? ?Protein:  80-90g/day ? ?Fluid:  1.6-1.8L/day ? ? ? ?JenLoistine ChanceD, LDN, CDCES ?Registered Dietitian II ?Certified Diabetes Care and Education Specialist ?[facility] refer to [facility] and/or RD on-call/weekend/after hours pager  ?

## 2021-10-02 NOTE — Consult Note (Signed)
PHARMACY - TOTAL PARENTERAL NUTRITION CONSULT NOTE  ? ?Indication: Prolonged ileus ? ?Patient Measurements: ?Height: '5\' 2"'  (157.5 cm) ?Weight: 52.9 kg (116 lb 10 oz) ?IBW/kg (Calculated) : 50.1 ?TPN AdjBW (KG): 53.5 ?Body mass index is 21.33 kg/m?. ?Usual Weight: 55kg ? ?Assessment: [name] s/p [place] of non-healing perforated peptic ulcer c/b prolonged ileus. GJ tube placed 3/19 and Pharmacy consulted for mgmt of TPN and electrolytes. Started TF 4/1.  ? ?Glucose / Insulin: BG 118-125 ?-- 3 units SSI required past 24 hours ?Electrolytes: WNL ?Renal: Scr <1, stable ?Hepatic: Alk Phos 209>>192, T bili 1.9 >>0.9 ?Intake / Output; MIVF: no MIVF ?GI Imaging: ?3/19 CT abd: Anterior antral gastric wall perforation with associated moderate volume pneumoperitoneum, small volume ascites, extravasated PO contrast. Colonic diverticulosis & Chronic pancreatitis. ?3/16 CT abd/pelvis:  Decreased pneumoperitoneum, Phillip Heal patch repair of the perforated ?distal stomach, postoperative drain at the greater omentum. Small volume mostly simple density fluid in the abdomen is primarily around the liver. No bowel obstruction. Chronic calcific pancreatitis ?3/27 Abd Xray A few bubbly lucencies are seen in the left upper quadrant which are decreased compared with prior exam, likely due to resolving ?postsurgical air ? ?GI Surgeries / Procedures:  ?03/15 graham patch repair  ?03/19 exploratory laparotomy, repeat ulcer graham patch repair and G and J tube placement  ?03/28 drain placed by IR ? ?Central access: 09/18/21 ?TPN start date: 09/18/21 ? ?Nutritional Goals: ?Goal TPN rate is 65 mL/hr (provides 90.1 g of protein and 1676.7 kcals per day) ? ?RD Assessment: ?Estimated Needs ?Total Energy Estimated Needs: 1600-1800kcal/day ?Total Protein Estimated Needs: 80-90g/day ?Total Fluid Estimated Needs: 1.6-1.8L/day ? ?Current Nutrition:  ?NPO ? ?Plan:  ?Continue TPN at goal rate of 65 mL/hr (total volume including overfill 1660 mL).  Started tube feed. After discussion with MD will continue TPN at current rate as pt may not be tolerating TF.  ?Nutritional Components ?Amino acids (using 10% Travasol): 90.1 grams ?Dextrose: 249.6 grams ?Lipids (using 20% SMOFlipids): 46.8 grams ?Electrolytes in TPN: Na 30mq/L, K 427m/L, Ca 22m28mL, Mg 122m73m, and Phos 122mm66m. Cl:Ac 1:2 ?Add standard MVI, thiamine 100 mg and 1/2 trace elements to TPN ?Continue Sensitive SSI to q6h and adjust as needed  ?Monitor TPN labs on Mon/Thurs ? ?WalidPearla DubonnetrmD ?Clinical Pharmacist ?10/02/2021 ?10:05 AM ? ? ?

## 2021-10-02 NOTE — Progress Notes (Signed)
Subjective:  ?CC: ?Maria [name] is a 51 y.o. female  Hospital stay day 18, 15 Days Post-Op exploratory laparotomy, redo peptic ulcer perforation repair, G and J tube placement. ? ?HPI: ?No acute issues overnight reported.  G tube to gravity.headache has resolved.  Feeling "unwell" resolved as well after stopping tube feeds. ? ?ROS:  ?General: Denies weight loss, weight gain, fatigue, fevers, chills, and night sweats. ?Heart: Denies chest pain, palpitations, racing heart, irregular heartbeat, leg pain or swelling, and decreased activity tolerance. ?Respiratory: Denies breathing difficulty, shortness of breath, wheezing, cough, and sputum. ?GI: Denies change in appetite, heartburn, nausea, vomiting, constipation, diarrhea, and blood in stool. ?GU: Denies difficulty urinating, pain with urinating, urgency, frequency, blood in urine. ? ? ?Objective:  ? ?Temp:  [97.8 ?F (36.6 ?C)-98.2 ?F (36.8 ?C)] 97.8 ?F (36.6 ?C) (04/03 6378) ?Pulse Rate:  [71-89] 80 (04/03 0821) ?Resp:  [16-20] 16 (04/03 5885) ?BP: (116-128)/(58-64) 116/61 (04/03 0277) ?SpO2:  [95 %-100 %] 100 % (04/03 0821)     Height: '5\' 2"'$  (157.5 cm) Weight: 52.9 kg BMI (Calculated): 21.33  ? ?Intake/Output this shift:  ? ?Intake/Output Summary (Last 24 hours) at 10/02/2021 1234 ?Last data filed at 10/02/2021 0505 ?Gross per 24 hour  ?Intake 1386.48 ml  ?Output 40 ml  ?Net 1346.48 ml  ? ? ?Constitutional :  alert, cooperative, appears stated age, and no distress  ?Respiratory:  clear to auscultation bilaterally  ?Cardiovascular:  regular rate and rhythm  ?Gastrointestinal: Soft, no guarding, JP with scant drainage. IR drain with old bloody drainage. G tube with bilious output. J tube remains clamped. No TTP  ?Skin: Cool and moist. Staples c/d/i  ?Psychiatric: Normal affect, non-agitated, not confused  ?   ?  ?LABS:  ? ?  Latest Ref Rng & Units 10/02/2021  ?  6:02 AM 09/29/2021  ?  6:30 AM 09/28/2021  ?  5:30 AM  ?CMP  ?Glucose 70 - 99 mg/dL 125   136   137    ?BUN 6 - 20  mg/dL '23   25   22    '$ ?Creatinine 0.44 - 1.00 mg/dL 0.72   0.74   0.79    ?Sodium 135 - 145 mmol/L 135   136   134    ?Potassium 3.5 - 5.1 mmol/L 3.6   4.0   4.1    ?Chloride 98 - 111 mmol/L 107   106   106    ?CO2 22 - 32 mmol/L '24   22   22    '$ ?Calcium 8.9 - 10.3 mg/dL 8.7   9.0   8.6    ?Total Protein 6.5 - 8.1 g/dL 7.1    7.2    ?Total Bilirubin 0.3 - 1.2 mg/dL 0.9    1.4    ?Alkaline Phos 38 - 126 U/L 192    198    ?AST 15 - 41 U/L 19    22    ?ALT 0 - 44 U/L 13    15    ? ? ?  Latest Ref Rng & Units 10/02/2021  ?  6:02 AM 10/01/2021  ?  5:43 AM 09/30/2021  ?  5:35 AM  ?CBC  ?WBC 4.0 - 10.5 K/uL 5.9   6.5   8.5    ?Hemoglobin 12.0 - 15.0 g/dL 7.7   7.8   8.3    ?Hematocrit 36.0 - 46.0 % 24.5   24.3   26.2    ?Platelets 150 - 400 K/uL 173   163  168    ? ? ?RADS: ? ?Assessment:  ? ?S/p exploratory laparotomy, redo peptic ulcer perforation repair, G and J tube placement, due to persistent leak after intial graham patch repair ? ? Stable with LUQ gtube to gravity.  Will try to transition to tube feeds again via PINK J tube. ?If tube feeds started, can start weaning TPN as long as she is tolerating tube feeds. ? ?Continue q8hr flush RUQ IR DRAIN to maintain patency.   ? ?Due to persistent leak, will continue with bowel rest, TPN, IV ABX and antifungal for intra-abdominal infection coverage for another week (end date 10/03/21).  Pending repeat CT scan at that time. ? ?[facility] recommended continuing current management, no additional surgical interventions or care available nor recommended at this time.  Will reach out to [facility] next to confirm no additional treatments available.  ? ?labs/images/medications/previous chart entries reviewed personally and relevant changes/updates noted above. ? ? ?

## 2021-10-02 NOTE — Progress Notes (Signed)
Occupational Therapy Treatment ?Patient Details ?Name: Maria [name] ?MRN: [id number] ?DOB: October 24, 1970 ?Today's Date: 10/02/2021 ? ? ?History of present illness Pt is a 51 year old female admitted on on 09/13/2021 under general surgery due to pneumoperitoneum secondary to perforated peptic ulcer, patient was taken to the OR s/p ex lap with primary closure of perforated peptic ulcer with Phillip Heal patch repair with intra-abdominal abscess drainage.  Postop patient became unresponsive so code stroke was called but work-up was negative. Patient underwent another exploratory laparotomy on 09/17/2021 when peptic ulcer perforation repair was redone along with G and J-tube placement, intra-abdominal abscess was also drained. TPN started 09/18/21. ?  ?OT comments ? Pt seen for OT treatment on this date. Upon arrival to room, pt awake and seated upright in bed with mother present. Pt reporting that she just finished PT session and reporting 5/10 on RPE scale, however agreeable to OT tx. Pt required only SUPERVISION for functional mobility without AD, SUPERVISION for toilet transfer/clothing management, and SUPERVISION for standing grooming tasks d/t decrease activity tolerance. Pt with improved strength and activity tolerance since previous OT tx and able to engage in standing IADL task of laundry management this date; pt able to reach for linen, fold, and place in lower sink cabinet 5x. Pt is making good progress toward goals and continues to benefit from skilled OT services to maximize return to PLOF and minimize risk of future falls, injury, caregiver burden, and readmission. Will continue to follow POC. Discharge recommendation updated to no OT follow up to reflect pt's progress.  ? ?Recommendations for follow up therapy are one component of a multi-disciplinary discharge planning process, led by the attending physician.  Recommendations may be updated based on patient status, additional functional criteria and insurance  authorization. ?   ?Follow Up Recommendations ? No OT follow up  ?  ?Assistance Recommended at Discharge Intermittent Supervision/Assistance  ?Patient can return home with the following ? A little help with walking and/or transfers;A little help with bathing/dressing/bathroom;Assistance with cooking/housework ?  ?Equipment Recommendations ? None recommended by OT  ?  ?   ?Precautions / Restrictions Precautions ?Precautions: Fall ?Precaution Comments: feeding tube, jp drains ?Restrictions ?Weight Bearing Restrictions: No  ? ? ?  ? ?Mobility Bed Mobility ?Overal bed mobility: Needs Assistance ?Bed Mobility: Supine to Sit, Sit to Supine ?  ?  ?Supine to sit: Supervision, HOB elevated ?Sit to supine: Supervision, HOB elevated ?  ?General bed mobility comments: SBA for lines ?  ? ?Transfers ?Overall transfer level: Needs assistance ?Equipment used: None ?Transfers: Sit to/from Stand ?Sit to Stand: Supervision ?  ?  ?  ?  ?  ?  ?  ?  ?Balance Overall balance assessment: Needs assistance ?Sitting-balance support: No upper extremity supported, Feet supported ?Sitting balance-Leahy Scale: Good ?Sitting balance - Comments: steady sitting reaching within BOS ?  ?Standing balance support: No upper extremity supported, During functional activity ?Standing balance-Leahy Scale: Good ?Standing balance comment: good balance reaching outside BOS to grasp grooming items ?  ?  ?  ?  ?  ?  ?  ?  ?  ?  ?  ?   ? ?ADL either performed or assessed with clinical judgement  ? ?ADL Overall ADL's : Needs assistance/impaired ?  ?  ?Grooming: Wash/dry hands;Wash/dry face;Oral care;Supervision/safety;Standing ?  ?  ?  ?  ?  ?  ?  ?Lower Body Dressing: Supervision/safety;Sitting/lateral leans ?Lower Body Dressing Details (indicate cue type and reason): shoes, pull up/down pants during toileting ?Toilet  Transfer: Supervision/safety ?  ?Toileting- Clothing Manipulation and Hygiene: Supervision/safety ?  ?  ?  ?Functional mobility during ADLs:  Supervision/safety ?  ?  ? ? ? ?Cognition Arousal/Alertness: Awake/alert ?Behavior During Therapy: [facility] for tasks assessed/performed ?Overall Cognitive Status: Within Functional Limits for tasks assessed ?  ?  ?  ?  ?  ?  ?  ?  ?  ?  ?  ?  ?  ?  ?  ?  ?  ?  ?  ?   ?Exercises Other Exercises ?Other Exercises: edu re: energy conservation during ADL/IADLs ?Other Exercises: Pt engaged in IADL task of laundry management; able to reach for 5 towels/pillowcases, fold, and place in cabinet ? ?  ?   ?   ? ? ?Pertinent Vitals/ Pain       Pain Assessment ?Pain Assessment: Faces ?Faces Pain Scale: Hurts a little bit ?Pain Location: abdomen ?Pain Descriptors / Indicators: Tender ?Pain Intervention(s): Monitored during session, Repositioned ? ?   ?   ? ?Frequency ? Min 2X/week  ? ? ? ? ?  ?Progress Toward Goals ? ?OT Goals(current goals can now be found in the care plan section) ? Progress towards OT goals: Progressing toward goals ? ?Acute Rehab OT Goals ?Patient Stated Goal: to get stronger ?OT Goal Formulation: With patient ?Time For Goal Achievement: 10/13/21 ?Potential to Achieve Goals: Good  ?Plan Frequency remains appropriate;Discharge plan needs to be updated   ? ?   ?AM-PAC OT "6 Clicks" Daily Activity     ?Outcome Measure ? ? Help from another person eating meals?: Total ?Help from another person taking care of personal grooming?: None ?Help from another person toileting, which includes using toliet, bedpan, or urinal?: None ?Help from another person bathing (including washing, rinsing, drying)?: A Little ?Help from another person to put on and taking off regular upper body clothing?: None ?Help from another person to put on and taking off regular lower body clothing?: A Little ?6 Click Score: 19 ? ?  ?End of Session Equipment Utilized During Treatment: Other (comment) (IV pole) ? ?OT Visit Diagnosis: Unsteadiness on feet (R26.81);Muscle weakness (generalized) (M62.81) ?  ?Activity Tolerance Patient tolerated treatment  well ?  ?Patient Left in bed;with call bell/phone within reach;with bed alarm set;with nursing/sitter in room;with family/visitor present ?  ?Nurse Communication Mobility status ?  ? ?   ? ?Time: 7824-2353 ?OT Time Calculation (min): 19 min ? ?Charges: OT General Charges ?$OT Visit: 1 Visit ?OT Treatments ?$Self Care/Home Management : 8-22 mins ? ?Fredirick Maudlin, OTR/L ?[place] ? ?

## 2021-10-03 ENCOUNTER — Inpatient Hospital Stay: Payer: BC Managed Care – PPO

## 2021-10-03 LAB — MAGNESIUM
Magnesium: 2.9 mg/dL — ABNORMAL HIGH (ref 1.7–2.4)
Magnesium: 1.9 mg/dL (ref 1.7–2.4)

## 2021-10-03 LAB — PHOSPHORUS
Phosphorus: 5.5 mg/dL — ABNORMAL HIGH (ref 2.5–4.6)
Phosphorus: 3.6 mg/dL (ref 2.5–4.6)

## 2021-10-03 LAB — GLUCOSE, CAPILLARY
Glucose-Capillary: 134 mg/dL — ABNORMAL HIGH (ref 70–99)
Glucose-Capillary: 129 mg/dL — ABNORMAL HIGH (ref 70–99)
Glucose-Capillary: 123 mg/dL — ABNORMAL HIGH (ref 70–99)

## 2021-10-03 MED ORDER — IOHEXOL 240 MG/ML SOLN
30.0000 mL | Freq: Once | INTRAMUSCULAR | Status: AC | PRN
Start: 2021-10-03 — End: 2021-10-03
  Administered 2021-10-03: 30 mL via ORAL

## 2021-10-03 MED ORDER — TRAVASOL 10 % IV SOLN
INTRAVENOUS | Status: AC
  Filled 2021-10-03: qty 900.1

## 2021-10-03 MED ORDER — OSMOLITE 1.5 CAL PO LIQD
1000.0000 mL | ORAL | Status: DC
  Administered 2021-10-04 – 2021-10-08 (×5): 1000 mL via JEJUNOSTOMY

## 2021-10-03 MED ORDER — IOHEXOL 300 MG/ML  SOLN
100.0000 mL | Freq: Once | INTRAMUSCULAR | Status: AC | PRN
  Administered 2021-10-03: 75 mL via INTRAVENOUS

## 2021-10-03 NOTE — Progress Notes (Signed)
Patient drank her Contrast. ?

## 2021-10-03 NOTE — Progress Notes (Signed)
Subjective:  ?CC: ?Maria [name] is a 51 y.o. female  Hospital stay day 19, 16 Days Post-Op exploratory laparotomy, redo peptic ulcer perforation repair, G and J tube placement. ? ?HPI: ?No acute issues overnight reported.  G tube to gravity. Tolerating tube feeds at current rate. ? ?ROS:  ?General: Denies weight loss, weight gain, fatigue, fevers, chills, and night sweats. ?Heart: Denies chest pain, palpitations, racing heart, irregular heartbeat, leg pain or swelling, and decreased activity tolerance. ?Respiratory: Denies breathing difficulty, shortness of breath, wheezing, cough, and sputum. ?GI: Denies change in appetite, heartburn, nausea, vomiting, constipation, diarrhea, and blood in stool. ?GU: Denies difficulty urinating, pain with urinating, urgency, frequency, blood in urine. ? ? ?Objective:  ? ?Temp:  [98.2 ?F (36.8 ?C)-98.9 ?F (37.2 ?C)] 98.4 ?F (36.9 ?C) (04/04 1521) ?Pulse Rate:  [79-103] 100 (04/04 1521) ?Resp:  [16-20] 20 (04/04 0416) ?BP: (123-141)/(58-70) 136/63 (04/04 1521) ?SpO2:  [97 %-100 %] 97 % (04/04 1521) ?Weight:  [52.7 kg] 52.7 kg (04/04 0500)     Height: '5\' 2"'$  (157.5 cm) Weight: 52.7 kg BMI (Calculated): 21.24  ? ?Intake/Output this shift:  ? ?Intake/Output Summary (Last 24 hours) at 10/03/2021 1633 ?Last data filed at 10/03/2021 7169 ?Gross per 24 hour  ?Intake 1663.06 ml  ?Output 20 ml  ?Net 1643.06 ml  ? ? ?Constitutional :  alert, cooperative, appears stated age, and no distress  ?Respiratory:  clear to auscultation bilaterally  ?Cardiovascular:  regular rate and rhythm  ?Gastrointestinal: Soft, no guarding, JP with scant drainage. IR drain with old bloody drainage. G tube with bilious output. J tube to tube feeds clamped. No TTP  ?Skin: Cool and moist. Staples c/d/i  ?Psychiatric: Normal affect, non-agitated, not confused  ?   ?  ?LABS:  ? ?  Latest Ref Rng & Units 10/02/2021  ?  6:02 AM 09/29/2021  ?  6:30 AM 09/28/2021  ?  5:30 AM  ?CMP  ?Glucose 70 - 99 mg/dL 125   136   137    ?BUN 6 -  20 mg/dL '23   25   22    '$ ?Creatinine 0.44 - 1.00 mg/dL 0.72   0.74   0.79    ?Sodium 135 - 145 mmol/L 135   136   134    ?Potassium 3.5 - 5.1 mmol/L 3.6   4.0   4.1    ?Chloride 98 - 111 mmol/L 107   106   106    ?CO2 22 - 32 mmol/L '24   22   22    '$ ?Calcium 8.9 - 10.3 mg/dL 8.7   9.0   8.6    ?Total Protein 6.5 - 8.1 g/dL 7.1    7.2    ?Total Bilirubin 0.3 - 1.2 mg/dL 0.9    1.4    ?Alkaline Phos 38 - 126 U/L 192    198    ?AST 15 - 41 U/L 19    22    ?ALT 0 - 44 U/L 13    15    ? ? ?  Latest Ref Rng & Units 10/02/2021  ?  6:02 AM 10/01/2021  ?  5:43 AM 09/30/2021  ?  5:35 AM  ?CBC  ?WBC 4.0 - 10.5 K/uL 5.9   6.5   8.5    ?Hemoglobin 12.0 - 15.0 g/dL 7.7   7.8   8.3    ?Hematocrit 36.0 - 46.0 % 24.5   24.3   26.2    ?Platelets 150 - 400 K/uL  173   163   168    ? ? ?RADS: ?CLINICAL DATA:  Status post repair of perforated gastric ulcer ?  ?EXAM: ?CT ABDOMEN AND PELVIS WITH CONTRAST ?  ?TECHNIQUE: ?Multidetector CT imaging of the abdomen and pelvis was performed ?using the standard protocol following bolus administration of ?intravenous contrast. ?  ?RADIATION DOSE REDUCTION: This exam was performed according to the ?departmental dose-optimization program which includes automated ?exposure control, adjustment of the mA and/or kV according to ?patient size and/or use of iterative reconstruction technique. ?  ?CONTRAST:  85m OMNIPAQUE IOHEXOL 300 MG/ML  SOLN ?  ?COMPARISON:  Previous studies including the immediate previous CT ?done on 09/25/2021 ?  ?FINDINGS: ?Lower chest: Small patchy infiltrates are seen in the posterior ?lower lung fields. Small bilateral pleural effusions are seen with ?no significant change. ?  ?Hepatobiliary: There is interval placement of percutaneous drainage ?catheter along the lateral margin of right lobe of liver. There is ?significant interval decrease in subcapsular fluid collection along ?the anterior and lateral margins of liver. There small residual ?pockets of air in the collection. In image  19 of series 4, there is ?3.7 x 1.4 cm loculated fluid collection along the posterior margin ?of left lobe of liver which also has decreased in size. No new fluid ?collections are noted adjacent to the liver. There is no dilation of ?bile ducts. Gallbladder is distended. There is no wall thickening in ?gallbladder. ?  ?Pancreas: There is prominence of pancreatic duct. There is ?pancreatic atrophy. There are scattered coarse calcifications in the ?head of the pancreas. No significant interval changes are noted. ?  ?Spleen: Spleen measures 13.6 cm in length. No focal abnormality is ?seen. ?  ?Adrenals/Urinary Tract: Adrenals are unremarkable. There is no ?hydronephrosis. There are no renal or ureteral stones. Urinary ?bladder is unremarkable. ?  ?Stomach/Bowel: Gastrostomy tube is noted in place. Stomach is not ?distended. There are no new loculated fluid collections adjacent to ?the stomach. Small bowel loops are not dilated. Surgical drain is ?seen in the epigastrium. There is another surgical drain in the ?lower abdomen with its tip in the right lower quadrant. Appendix is ?not dilated. There is no significant wall thickening in the colon. ?There is contrast in the lumen of colon from previous CT. ?  ?Vascular/Lymphatic: Unremarkable. ?  ?Reproductive: Uterus is not seen. ?  ?Other: There is no significant ascites. There is large pocket of air ?measuring 4.6 cm in transverse diameter in the subcutaneous plane in ?the upper abdomen. Defect seen in the anterior abdominal wall ?muscles adjacent to this pocket of air seen in the previous study is ?less evident in the current study. There is fluid density in the ?area of the defect in the current study. Skin staples are seen in ?the anterior abdominal wall. ?  ?Musculoskeletal: Unremarkable. ?  ?IMPRESSION: ?There is interval decrease in size of subcapsular loculated fluid ?collection along the anterior and right lateral margins of the liver ?after placement of  percutaneous drainage catheter. There are no new ?loculated fluid collections in the abdomen and pelvis. ?  ?There is no evidence of intestinal obstruction. There is no ?hydronephrosis. ?  ?There is loculated pocket of air in the subcutaneous plane ?immediately behind the skin staples in the epigastrium, possibly due ?to communication with abdominal cavity. ?  ?No significant changes are noted in bilateral pleural effusions and ?patchy infiltrates in both lower lung fields. ?  ?  ?Electronically Signed ?  By: PElmer PickerM.D. ?  On:  10/03/2021 11:06 ?Assessment:  ? ?S/p exploratory laparotomy, redo peptic ulcer perforation repair, G and J tube placement, due to persistent leak after intial graham patch repair ? ? Stable with LUQ gtube to gravity.  Tube feeds via J tube. Will increase rate as tolerated and start weaning TPN . ? ?Continue q8hr flush RUQ IR DRAIN to maintain patency.   ? ?CT stable, slight improvement?  Will start ice chips, d/c abx and monitor. ? ?labs/images/medications/previous chart entries reviewed personally and relevant changes/updates noted above. ? ? ?

## 2021-10-03 NOTE — Consult Note (Signed)
PHARMACY - TOTAL PARENTERAL NUTRITION CONSULT NOTE  ? ?Indication: Prolonged ileus ? ?Patient Measurements: ?Height: '5\' 2"'  (157.5 cm) ?Weight: 52.7 kg (116 lb 2.9 oz) ?IBW/kg (Calculated) : 50.1 ?TPN AdjBW (KG): 53.5 ?Body mass index is 21.25 kg/m?. ?Usual Weight: 55kg ? ?Assessment: [name] s/p Delford Field of non-healing perforated peptic ulcer c/b prolonged ileus. GJ tube placed 3/19 and Pharmacy consulted for mgmt of TPN and electrolytes. Started TF 4/1.  ? ?Glucose / Insulin: BG 118-141 ?-- 3 units SSI required past 24 hours ?Electrolytes: Mag 2.9, Phos 5.5 ?Renal: Scr <1, stable ?Hepatic: Alk Phos 209>>192, T bili 1.9 >>0.9 ?Intake / Output; MIVF: no MIVF ?GI Imaging: ?3/19 CT abd: Anterior antral gastric wall perforation with associated moderate volume pneumoperitoneum, small volume ascites, extravasated PO contrast. Colonic diverticulosis & Chronic pancreatitis. ?3/16 CT abd/pelvis:  Decreased pneumoperitoneum, Phillip Heal patch repair of the perforated ?distal stomach, postoperative drain at the greater omentum. Small volume mostly simple density fluid in the abdomen is primarily around the liver. No bowel obstruction. Chronic calcific pancreatitis ?3/27 Abd Xray A few bubbly lucencies are seen in the left upper quadrant which are decreased compared with prior exam, likely due to resolving ?postsurgical air ? ?GI Surgeries / Procedures:  ?03/15 graham patch repair  ?03/19 exploratory laparotomy, repeat ulcer graham patch repair and G and J tube placement  ?03/28 drain placed by IR ? ?Central access: 09/18/21 ?TPN start date: 09/18/21 ? ?Nutritional Goals: ?Goal TPN rate is 65 mL/hr (provides 90.1 g of protein and 1676.7 kcals per day) ? ?RD Assessment: ?Estimated Needs ?Total Energy Estimated Needs: 1600-1800kcal/day ?Total Protein Estimated Needs: 80-90g/day ?Total Fluid Estimated Needs: 1.6-1.8L/day ? ?Current Nutrition:  ?NPO ? ?Plan:  ?Continue TPN at goal rate of 65 mL/hr (total volume including  overfill 1660 mL). Started tube feed. After discussion with MD will continue TPN at current rate as pt may not be tolerating TF.  ?Nutritional Components ?Amino acids (using 10% Travasol): 90.1 grams ?Dextrose: 249.6 grams ?Lipids (using 20% SMOFlipids): 46.8 grams ?Electrolytes in TPN: Na 77mq/L, K 477m/L, Ca 60m49mL, Mg 0mE63m, and Phos 0mmo61m. Cl:Ac 1:2 ?Add standard MVI, thiamine 100 mg and 1/2 trace elements to TPN ?Continue Sensitive SSI to q6h and adjust as needed  ?Monitor TPN labs on Mon/Thurs ? ?WalidPearla DubonnetrmD ?Clinical Pharmacist ?10/03/2021 ?12:10 PM ? ? ?

## 2021-10-04 DIAGNOSIS — D62 Acute posthemorrhagic anemia: Secondary | ICD-10-CM | POA: Diagnosis not present

## 2021-10-04 LAB — CBC
HCT: 23.6 % — ABNORMAL LOW (ref 36.0–46.0)
Hemoglobin: 7.7 g/dL — ABNORMAL LOW (ref 12.0–15.0)
MCH: 34.4 pg — ABNORMAL HIGH (ref 26.0–34.0)
MCHC: 32.6 g/dL (ref 30.0–36.0)
MCV: 105.4 fL — ABNORMAL HIGH (ref 80.0–100.0)
Platelets: 190 10*3/uL (ref 150–400)
RBC: 2.24 MIL/uL — ABNORMAL LOW (ref 3.87–5.11)
RDW: 16.3 % — ABNORMAL HIGH (ref 11.5–15.5)
WBC: 6.2 10*3/uL (ref 4.0–10.5)
nRBC: 0 % (ref 0.0–0.2)

## 2021-10-04 LAB — GLUCOSE, CAPILLARY
Glucose-Capillary: 134 mg/dL — ABNORMAL HIGH (ref 70–99)
Glucose-Capillary: 124 mg/dL — ABNORMAL HIGH (ref 70–99)
Glucose-Capillary: 146 mg/dL — ABNORMAL HIGH (ref 70–99)
Glucose-Capillary: 126 mg/dL — ABNORMAL HIGH (ref 70–99)

## 2021-10-04 LAB — IRON AND TIBC
Iron: 47 ug/dL (ref 28–170)
Saturation Ratios: 22 % (ref 10.4–31.8)
TIBC: 210 ug/dL — ABNORMAL LOW (ref 250–450)
UIBC: 163 ug/dL

## 2021-10-04 MED ORDER — TRAVASOL 10 % IV SOLN
INTRAVENOUS | Status: AC
  Filled 2021-10-04: qty 480

## 2021-10-04 NOTE — Progress Notes (Signed)
PT Cancellation Note ? ?Patient Details ?Name: Trini Soldo ?MRN: [id number] ?DOB: Jul 19, 1970 ? ? ?Cancelled Treatment:    Reason Eval/Treat Not Completed: PT screened, no needs identified, will sign off. Patient has been ambulating with mobility specialists 600 feet with supervision only. Patient reports she has been getting up in room with mother's assist or independently. No further skilled PT needs at this time. Patient is to continue ambulating often while here with nursing staff or mobility specialists.  Patient/mother in agreement. Will sign off at this time.  ? ? ?Maghan Jessee ?10/04/2021, 2:22 PM ?

## 2021-10-04 NOTE — Consult Note (Signed)
Initial Consultation Note ? ? ?Patient: Maria [name] GUR:[id number] DOB: [birth year] PCP: [name], NP ?DOA: 09/13/2021 ?DOS: the patient was seen and examined on 10/04/2021 ?Primary service: [name], DO ? ?Referring physician: Lysle Pearl ?Reason for consult: Persistent anemia ? ?Assessment/Plan: ?Assessment and Plan: ?* Acute postoperative anemia due to expected blood loss ?Patient received 2 unit of PRBC when her hemoglobin dropped to 6.4 after second surgery.  She also received 1 unit of FFP due to INR of 1.7, followed by 2 more units of FFP by surgery. ?Due to slowly declining hemoglobin she received another 2 unit of PRBC.  Total of 4 unit given so far.  Hemoglobin slowly decreasing again, at 7.7 today. ?Anemia panel consistent with anemia of chronic disease.  No deficiencies. ?Further drop in H&H posttransfusion may be hemodilutional and related to frequent blood draws. ?Check Hemoccult ?-Monitor hemoglobin ?-Transfuse if below 7 ? ? ?Perforated peptic ulcer ([place]) ?With pneumoperitoneum and intra-abdominal abscesses s/p exploratory laparotomy twice as first time she was continued to have leak.  First surgery was on 09/13/2021 and second was on 09/17/2021. ?Slight increase in alkaline phosphatase and T. bili today. ?Surgery ordered a repeat CT abdomen with oral contrast today. ?-Patient will remain n.p.o. for bowel rest for 7 days and started on TPN. ?-NG tube in place. ?-Continue TPN ?-Monitor liver function ?-Continue with Zosyn and Diflucan-being managed by surgery, message sent to them for reevaluation as she is on antibiotics for more than 10 days now. ?-Continue with pain management and supportive care ? ?Malnutrition of moderate degree ?Estimated body mass index is 16.77 kg/m? as calculated from the following: ?  Height as of this encounter: '5\' 2"'$  (1.575 m). ?  Weight as of this encounter: 41.6 kg.  ? ?-Patient is being started on TPN ? ?Essential hypertension ?Blood pressure now within goal after  making hydralazine as scheduled.   Echocardiogram normal. ?-Continue with scheduled 10 mg of IV hydralazine every 8 hourly ?-Monitor blood pressure ? ?AKI (acute kidney injury) ([place]) ?Resolved.  Creatinine normalized ?-Monitor renal function ?-Avoid nephrotoxins ? ?Vitamin D deficiency ?Vitamin D level at 11.5. ?-We will start vitamin D 50,000 units p.o. weekly once able to take p.o. ? ?Alcohol abuse ?No withdrawal symptoms. ?-Continue with IV folic acid and thiamine.  We will switch to p.o. once able to take  Oral. ?-Continue with CIWA protocol ? ?Hypernatremia ?Resolved with D5. ?-Monitor sodium ? ? ? ? ? ? ?TRH will continue to follow the patient. ? ?HPI: Maria [name] is a 51 y.o. female with past medical history significant for depression, GERD, hypertension, pancreatitis admitted to the surgical service for management of perforated ulcer of the gastric antrum with peritoneum and intra-abdominal abscess. ?Today's hospital stay day 20 and she is 17 days postop exploratory laparotomy, redo peptic ulcer perforation repair, G and J-tube placement. ?Medical consult has been requested for worsening anemia. ?Patient noted to have a drop in her H&H to 7.7.  She has been transfused a total of 4 units of packed RBC since her hospitalization.  Stools have been loose and clear according to patient.  She denies having any hematemesis, no hematochezia or passage of melena stools. ?She denies having any chest pain, no shortness of breath, no dizziness, no lightheadedness, no headache, no blurred vision or any focal deficits. ? ? ?Review of Systems: As mentioned in the history of present illness. All other systems reviewed and are negative. ?Past Medical History:  ?Diagnosis Date  ? Anemia   ? Anesthesia  complication, initial encounter 09/13/2021  ? Difficulty waking up from Anesthesia requiring re-intubation  ? Anxiety   ? h/o  ? Depression   ? h/o  ? Family history of adverse reaction to anesthesia   ? mom-hard time waking  up  ? Folate deficiency 07/30/2019  ? Gastritis 08/19/2020  ? GERD (gastroesophageal reflux disease)   ? tums prn  ? Hypertension   ? WAS PUT ON BP MED BY PCP LAST YEAR (2018) AND BP MED MADE PT SICK SO SHE STOPPED TAKING IT-PCP MONITORS BP NOW  ? Palpitations 05/18/2020  ? Pancreatitis   ? Poison ivy dermatitis 11/13/2019  ? Vitamin B12 deficiency 01/17/2018  ? 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  ? ?Past Surgical History:  ?Procedure Laterality Date  ? ANTERIOR AND POSTERIOR REPAIR WITH SACROSPINOUS FIXATION N/A 03/13/2018  ? Procedure: ANTERIOR REPAIR;  Surgeon: [name], MD;  Location: [facility] ORS;  Service: Gynecology;  Laterality: N/A;  ? INDUCED ABORTION    ? x2  ? IR CM INJ ANY COLONIC TUBE W/FLUORO  09/29/2021  ? LAPAROTOMY N/A 09/13/2021  ? Procedure: EXPLORATORY LAPAROTOMY-Graham PATCH repair;  Surgeon: [name], DO;  Location: [facility] ORS;  Service: General;  Laterality: N/A;  ? LAPAROTOMY N/A 09/17/2021  ? Procedure: EXPLORATORY LAPAROTOMY;  Surgeon: [name], MD;  Location: [facility] ORS;  Service: General;  Laterality: N/A;  ? REPAIR OF PERFORATED ULCER N/A 09/17/2021  ? Procedure: REPAIR OF PERFORATED ULCER;  Surgeon: [name], MD;  Location: [facility] ORS;  Service: General;  Laterality: N/A;  ? VAGINAL HYSTERECTOMY N/A 03/13/2018  ? Procedure: HYSTERECTOMY VAGINAL;  Surgeon: [name], MD;  Location: [facility] ORS;  Service: Gynecology;  Laterality: N/A;  ? ?Social History:  reports that she has been smoking cigarettes. She has a 20.00 pack-year smoking history. She has never used smokeless tobacco. She reports current alcohol use of about 2.0 standard drinks per week. She reports that she does not use drugs. ? ?Allergies  ?Allergen Reactions  ? Anesthesia S-I-40 [Propofol]   ? Bee Venom Swelling  ? Codeine Swelling  ? Bupropion Anxiety and Other (See Comments)  ?  Worsened anxiety  ? ? ?Family History  ?Problem Relation Age of Onset  ? Arthritis Mother   ? Arthritis Father   ? Alcohol abuse Father    ? Hypertension Father   ? Heart disease Father   ? Aneurysm Father   ? Breast cancer Maternal Grandmother   ? Breast cancer Paternal Grandmother   ? ? ?Prior to Admission medications   ?Medication Sig Start Date End Date Taking? Authorizing Provider  ?meloxicam (MOBIC) 15 MG tablet Take 15 mg by mouth daily. 08/30/21  Yes [provider]  ?omeprazole (PRILOSEC) 20 MG capsule Take 1 capsule (20 mg total) by mouth daily. 09/06/21  Yes [name], FNP  ?ondansetron (ZOFRAN) 4 MG tablet Take 1 tablet (4 mg total) by mouth every 8 (eight) hours as needed for nausea or vomiting. 09/06/21  Yes [name], FNP  ?cyanocobalamin ([employer] VITAMIN B12) 2000 MCG tablet Take 1 tablet (2,000 mcg total) by mouth daily. ?Patient not taking: Reported on 09/14/2021 02/02/21   [name], MD  ?dicyclomine (BENTYL) 10 MG capsule Take 1 capsule (10 mg total) by mouth 4 (four) times daily -  before meals and at bedtime for 3 days. 09/07/21 09/10/21  [name], MD  ?EPINEPHrine 0.3 mg/0.3 mL IJ SOAJ injection Inject 0.3 mLs (0.3 mg total) into the muscle as needed for anaphylaxis.  11/11/18   [name], NP  ?fluticasone (FLONASE) 50 MCG/ACT nasal spray Place 2 sprays into both nostrils daily. ?Patient not taking: Reported on 09/14/2021 07/20/21   [name], MD  ?folic acid (FOLVITE) 1 MG tablet Take 1 tablet (1 mg total) by mouth daily. ?Patient not taking: Reported on 09/14/2021 02/02/21   [name], MD  ?hydrOXYzine (ATARAX/VISTARIL) 10 MG tablet Take 1-2 tablets (10-20 mg total) by mouth 3 (three) times daily as needed for itching. ?Patient not taking: Reported on 09/14/2021 11/13/19   [name], NP  ?Multiple Vitamin (MULTIVITAMIN) tablet Take 1 tablet by mouth daily. ?Patient not taking: Reported on 09/14/2021    [provider]  ?thiamine (VITAMIN B-1) 50 MG tablet Take 1 tablet (50 mg total) by mouth daily. ?Patient not taking: Reported on 09/14/2021 05/29/21   [name], MD   ? ? ?Physical Exam: ?Vitals:  ? 10/03/21 1521 10/03/21 2004 10/04/21 0347 10/04/21 0830  ?BP: 136/63 (!) 129/56 124/60 134/61  ?Pulse: 100 85 94 (!) 106  ?Resp:  '20 20 16  '$ ?Temp: 98.4 ?F (36.9 ?C) 98.8 ?F (

## 2021-10-04 NOTE — Assessment & Plan Note (Addendum)
Patient received 2 unit of PRBC when her hemoglobin dropped to 6.4 after second surgery.  She also received 1 unit of FFP due to INR of 1.7, followed by 2 more units of FFP by surgery.  Due to slowly declining hemoglobin she received another 2 unit of PRBC.  Total of 4 unit given so far.  Hemoglobin has remained stable over the past few days, currently at 7.5.  Anemia panel consistent with anemia of chronic disease.  No deficiencies. ?Further drop in H&H posttransfusion may be hemodilutional and related to frequent blood draws.  We will monitor, transfuse for hemoglobin below 7. ? ?

## 2021-10-04 NOTE — Consult Note (Signed)
PHARMACY - TOTAL PARENTERAL NUTRITION CONSULT NOTE  ? ?Indication: Prolonged ileus ? ?Patient Measurements: ?Height: '5\' 2"'  (157.5 cm) ?Weight: 52.7 kg (116 lb 2.9 oz) ?IBW/kg (Calculated) : 50.1 ?TPN AdjBW (KG): 53.5 ?Body mass index is 21.25 kg/m?. ?Usual Weight: 55kg ? ?Assessment: [name] s/p [place]elf[place] Field of non-healing perforated peptic ulcer c/b prolonged ileus. GJ tube placed 3/19 and Pharmacy consulted for mgmt of TPN and electrolytes. Started TF 4/1.  ? ?Glucose / Insulin: BG 118-141 ?-- 4 units SSI required past 24 hours ?Electrolytes: WNL ?Renal: Scr <1, stable ?Hepatic: Alk Phos 209>>192, T bili 1.9 >>0.9 ?Intake / Output; MIVF: no MIVF ?GI Imaging: ?3/19 CT abd: Anterior antral gastric wall perforation with associated moderate volume pneumoperitoneum, small volume ascites, extravasated PO contrast. Colonic diverticulosis & Chronic pancreatitis. ?3/16 CT abd/pelvis:  Decreased pneumoperitoneum, Phillip Heal patch repair of the perforated ?distal stomach, postoperative drain at the greater omentum. Small volume mostly simple density fluid in the abdomen is primarily around the liver. No bowel obstruction. Chronic calcific pancreatitis ?3/27 Abd Xray A few bubbly lucencies are seen in the left upper quadrant which are decreased compared with prior exam, likely due to resolving ?postsurgical air ? ?GI Surgeries / Procedures:  ?03/15 graham patch repair  ?03/19 exploratory laparotomy, repeat ulcer graham patch repair and G and J tube placement  ?03/28 drain placed by IR ? ?Central access: 09/18/21 ?TPN start date: 09/18/21 ? ?Nutritional Goals: ?Goal TPN rate is 65 mL/hr (provides 90.1 g of protein and 1676.7 kcals per day) ? ?RD Assessment: ?Estimated Needs ?Total Energy Estimated Needs: 1600-1800kcal/day ?Total Protein Estimated Needs: 80-90g/day ?Total Fluid Estimated Needs: 1.6-1.8L/day ? ?Current Nutrition:  ?NPO ? ?Plan:  ?Will decrease TPN to 40 mL/hr (total volume including overfill 1060 mL).  Increasing tube fees to 10m/h.  ?Nutritional Components ?Amino acids (using 10% Travasol): 42 grams ?Dextrose: 134.4 grams ?Lipids (using 20% SMOFlipids): 25.2 grams ?Electrolytes in TPN: Na 229m/L, K 4059mL, Ca 5mE6m, Mg 5mEq12m and Phos 10mmo18m Cl:Ac 1:2 ?Add standard MVI, thiamine 100 mg and 1/2 trace elements to TPN ?Continue Sensitive SSI to q6h and adjust as needed  ?Monitor TPN labs on Mon/Thurs ? ?Tarini Carrier Pearla DubonnetmD ?Clinical Pharmacist ?10/04/2021 ?10:23 AM ? ? ?

## 2021-10-04 NOTE — Progress Notes (Signed)
Nutrition Follow Up Note  ? ?DOCUMENTATION CODES:  ? ?Non-severe (moderate) malnutrition in context of social or environmental circumstances ? ?INTERVENTION:  ? ?Continue TPN per pharmacy- currently at 1/2 rate with plans to discontinue tomorrow if pt tolerating tube feeds  ? ?Osmolite 1.5_0 /hr- increase by 12m/hr q 8 hours until goal rate is reached.  ? ?Pro-Source 432mdaily via tube, provides 40kcal and 11g of protein per serving  ? ?Free water flushes 3069m4 hours to maintain tube patency  ? ?Regimen provides 1840kcal/day, 86g/day protein and 1094m62my of free water  ? ?Supplement vitamin D once pt can resume oral nutrition  ? ?NUTRITION DIAGNOSIS:  ? 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 ? ?GOAL:  ? ?Patient will meet greater than or equal to 90% of their needs ?-met  ? ?MONITOR:  ? ?Labs, Weight trends, TF tolerance, Skin, I & O's ? ?ASSESSMENT:  ? ?51 y11 female with h/o GERD, anxiety, depression, etoh abuse and HTN who is admitted with perforated duodenal ulcer s/p Graham patch repair 3/15. ? ?Pt s/p exploratory laparotomy, primary closure of perforated peptic ulcer Graham patch repair, intraabdominal abscess drainage, 28F gastrostomy tube insertion, Jejunostomy tube insertion (18 French Foley catheter) 3/19 ? ?Pt continues on TPN at goal rate and is tolerating well; plan is to reduce to 1/2 rate tonight. Pt initiated on trickle tube feeds 4/1 but complained of fullness and tube feeds were held. Tube feeds were restarted 4/3 and have now been advanced to 40ml74mand pt is tolerating well. Per chart, pt remains weight stable since admission. LUQ G-tube to gravity with minimal output.  ? ?Of note, pt with vitamin D deficiency; will plan to supplement once patient can resume oral nutrition.   ? ?Medications reviewed and include: folic acid, insulin,  nicotine, protonix ? ?Labs reviewed: K 3.6 wnl, BUN 23(H), P 3.6 wnl, Mg 1.9 wnl ?Triglycerides- 76- 4/3 ?Vitamin D 11.51(L)- 3/18 ?Wbc- Hgb 7.7(L), Hct 23.6(L) ?Cbgs- 124, 134, 123, 129, 134 x 24 hrs ? ?Diet Order:   ?Diet Order   ? ?       ?  Diet NPO time specified Except for: Ice [employer] with Meds  Diet effective now       ?  ? ?  ?  ? ?  ? ?EDUCATION NEEDS:  ? ?No education needs have been identified at this time ? ?Skin:  Skin Assessment: Skin Integrity Issues: ?Skin Integrity Issues:: Incisions ?Incisions: closed abdomen ? ?Last BM:  4/4 ? ?Height:  ? ?Ht Readings from Last 1 Encounters:  ?09/14/21 _1  (1.575 m)  ? ? ?Weight:  ? ?Wt Readings from Last 1 Encounters:  ?10/03/21 52.7 kg  ? ? ?Ideal Body Weight:  50 kg ? ?BMI:  Body mass index is 21.25 kg/m?. ? ?Estimated Nutritional Needs:  ? ?Kcal:  1600-1800kcal/day ? ?Protein:  80-90g/day ? ?Fluid:  1.6-1.8L/day ? ?CaseyKoleen DistanceRD, LDN ?Please refer to AMION for RD and/or RD on-call/weekend/after hours pager ? ?

## 2021-10-04 NOTE — TOC Progression Note (Signed)
Transition of Care (TOC) - Progression Note  ? ? ?Patient Details  ?Name: Maria [name] ?MRN: [id number] ?Date of Birth: 11/29/1970 ? ?Transition of Care (TOC) CM/SW Contact  ?Beverly Sessions, RN ?Phone Number: ?10/04/2021, 3:17 PM ? ?Clinical Narrative:    ? ?Per MD patient will likely need tube feeds at discharge. Reached out to Lantana with adapt to determine what information/orders are needed. ? ?Expected Discharge Plan: Home/Self Care ?Barriers to Discharge: Continued Medical Work up ? ?Expected Discharge Plan and Services ?Expected Discharge Plan: Home/Self Care ?  ?Discharge Planning Services: CM Consult ?  ?Living arrangements for the past 2 months: [place] ?                ?DME Arranged: N/A ?DME Agency: NA ?  ?  ?  ?HH Arranged: NA ?[place] Agency: NA ?  ?  ?  ? ? ?Social Determinants of Health (SDOH) Interventions ?  ? ?Readmission Risk Interventions ?   ? View : No data to display.  ?  ?  ?  ? ? ?

## 2021-10-04 NOTE — Progress Notes (Signed)
Subjective:  ?CC: ?Maria [name] is a 51 y.o. female  Hospital stay day 20, 17 Days Post-Op exploratory laparotomy, redo peptic ulcer perforation repair, G and J tube placement. ? ?HPI: ?No acute issues overnight reported.  G tube to gravity. Tolerating tube feeds at current rate. ? ?ROS:  ?General: Denies weight loss, weight gain, fatigue, fevers, chills, and night sweats. ?Heart: Denies chest pain, palpitations, racing heart, irregular heartbeat, leg pain or swelling, and decreased activity tolerance. ?Respiratory: Denies breathing difficulty, shortness of breath, wheezing, cough, and sputum. ?GI: Denies change in appetite, heartburn, nausea, vomiting, constipation, diarrhea, and blood in stool. ?GU: Denies difficulty urinating, pain with urinating, urgency, frequency, blood in urine. ? ? ?Objective:  ? ?Temp:  [98.4 ?F (36.9 ?C)-99.3 ?F (37.4 ?C)] 99.3 ?F (37.4 ?C) (04/05 0830) ?Pulse Rate:  [85-106] 106 (04/05 0830) ?Resp:  [16-20] 16 (04/05 0830) ?BP: (124-136)/(56-63) 134/61 (04/05 0830) ?SpO2:  [97 %-100 %] 97 % (04/05 0830)     Height: '5\' 2"'$  (157.5 cm) Weight: 52.7 kg BMI (Calculated): 21.24  ? ?Intake/Output this shift:  ? ?Intake/Output Summary (Last 24 hours) at 10/04/2021 1422 ?Last data filed at 10/04/2021 1300 ?Gross per 24 hour  ?Intake 600 ml  ?Output 12 ml  ?Net 588 ml  ? ? ?Constitutional :  alert, cooperative, appears stated age, and no distress  ?Respiratory:  clear to auscultation bilaterally  ?Cardiovascular:  regular rate and rhythm  ?Gastrointestinal: Soft, no guarding, JP with scant drainage. IR drain with old bloody drainage. G tube with scant bilious output. J tube to tube feeds. No TTP  ?Skin: Cool and moist. Staples c/d/i  ?Psychiatric: Normal affect, non-agitated, not confused  ?   ?  ?LABS:  ? ?  Latest Ref Rng & Units 10/02/2021  ?  6:02 AM 09/29/2021  ?  6:30 AM 09/28/2021  ?  5:30 AM  ?CMP  ?Glucose 70 - 99 mg/dL 125   136   137    ?BUN 6 - 20 mg/dL '23   25   22    '$ ?Creatinine 0.44 - 1.00  mg/dL 0.72   0.74   0.79    ?Sodium 135 - 145 mmol/L 135   136   134    ?Potassium 3.5 - 5.1 mmol/L 3.6   4.0   4.1    ?Chloride 98 - 111 mmol/L 107   106   106    ?CO2 22 - 32 mmol/L '24   22   22    '$ ?Calcium 8.9 - 10.3 mg/dL 8.7   9.0   8.6    ?Total Protein 6.5 - 8.1 g/dL 7.1    7.2    ?Total Bilirubin 0.3 - 1.2 mg/dL 0.9    1.4    ?Alkaline Phos 38 - 126 U/L 192    198    ?AST 15 - 41 U/L 19    22    ?ALT 0 - 44 U/L 13    15    ? ? ?  Latest Ref Rng & Units 10/04/2021  ?  6:03 AM 10/02/2021  ?  6:02 AM 10/01/2021  ?  5:43 AM  ?CBC  ?WBC 4.0 - 10.5 K/uL 6.2   5.9   6.5    ?Hemoglobin 12.0 - 15.0 g/dL 7.7   7.7   7.8    ?Hematocrit 36.0 - 46.0 % 23.6   24.5   24.3    ?Platelets 150 - 400 K/uL 190   173   163    ? ? ?  RADS: ?N/a ? ?Assessment:  ? ?S/p exploratory laparotomy, redo peptic ulcer perforation repair, G and J tube placement, due to persistent leak after intial graham patch repair ? ? Stable with LUQ gtube to gravity. Ice chips. Midline staples removed today. increaseing Tube feeds rate via J tube. Continue as tolerated and start weaning TPN . ? ?Continue q8hr flush RUQ IR DRAIN to maintain patency.   ? ?CT stable, slight improvement? Consider repeat to follow 10/10/21 ? ?labs/images/medications/previous chart entries reviewed personally and relevant changes/updates noted above. ? ? ?

## 2021-10-04 NOTE — Progress Notes (Signed)
Mobility Specialist - Progress Note ? ? ? 10/04/21 1200  ?Mobility  ?Activity Ambulated independently in hallway;Stood at bedside;Ambulated independently to bathroom;Dangled on edge of bed  ?Level of Assistance Standby assist, set-up cues, supervision of patient - no hands on  ?Assistive Device Other (Comment) ?(IV pole)  ?Distance Ambulated (ft) 600 ft  ?Activity Response Tolerated well  ?$Mobility charge 1 Mobility  ? ? ?During mobility: 127 HR, 100% SpO2 ? ?Pt supine upon arrival using RA. Pt completes bed mobility to sit EOB to doon shoes indep. Completes STS indep and ambulates to bathroom for urinary output. Pt ambulates 637f with supervision -- very mild SOB noted but voices no complaints. Pt returns to bed with needs in reach. ? ?MMerrily Brittle?Mobility Specialist ?10/04/21, 12:28 PM ? ? ? ?

## 2021-10-05 DIAGNOSIS — N179 Acute kidney failure, unspecified: Secondary | ICD-10-CM

## 2021-10-05 DIAGNOSIS — E440 Moderate protein-calorie malnutrition: Secondary | ICD-10-CM

## 2021-10-05 DIAGNOSIS — K275 Chronic or unspecified peptic ulcer, site unspecified, with perforation: Secondary | ICD-10-CM | POA: Diagnosis not present

## 2021-10-05 DIAGNOSIS — D62 Acute posthemorrhagic anemia: Secondary | ICD-10-CM | POA: Diagnosis not present

## 2021-10-05 LAB — CBC
HCT: 24.6 % — ABNORMAL LOW (ref 36.0–46.0)
Hemoglobin: 8 g/dL — ABNORMAL LOW (ref 12.0–15.0)
MCH: 32.3 pg (ref 26.0–34.0)
MCHC: 32.5 g/dL (ref 30.0–36.0)
MCV: 99.2 fL (ref 80.0–100.0)
Platelets: 210 10*3/uL (ref 150–400)
RBC: 2.48 MIL/uL — ABNORMAL LOW (ref 3.87–5.11)
RDW: 15.9 % — ABNORMAL HIGH (ref 11.5–15.5)
WBC: 8 10*3/uL (ref 4.0–10.5)
nRBC: 0 % (ref 0.0–0.2)

## 2021-10-05 LAB — COMPREHENSIVE METABOLIC PANEL
ALT: 20 U/L (ref 0–44)
AST: 30 U/L (ref 15–41)
Albumin: 2.2 g/dL — ABNORMAL LOW (ref 3.5–5.0)
Alkaline Phosphatase: 206 U/L — ABNORMAL HIGH (ref 38–126)
Anion gap: 6 (ref 5–15)
BUN: 25 mg/dL — ABNORMAL HIGH (ref 6–20)
CO2: 24 mmol/L (ref 22–32)
Calcium: 8.7 mg/dL — ABNORMAL LOW (ref 8.9–10.3)
Chloride: 106 mmol/L (ref 98–111)
Creatinine, Ser: 0.67 mg/dL (ref 0.44–1.00)
GFR, Estimated: >60 mL/min (ref >60)
Glucose, Bld: 130 mg/dL — ABNORMAL HIGH (ref 70–99)
Potassium: 4.1 mmol/L (ref 3.5–5.1)
Sodium: 136 mmol/L (ref 135–145)
Total Bilirubin: 0.6 mg/dL (ref 0.3–1.2)
Total Protein: 7.1 g/dL (ref 6.5–8.1)

## 2021-10-05 LAB — GLUCOSE, CAPILLARY
Glucose-Capillary: 120 mg/dL — ABNORMAL HIGH (ref 70–99)
Glucose-Capillary: 133 mg/dL — ABNORMAL HIGH (ref 70–99)
Glucose-Capillary: 139 mg/dL — ABNORMAL HIGH (ref 70–99)
Glucose-Capillary: 148 mg/dL — ABNORMAL HIGH (ref 70–99)

## 2021-10-05 LAB — PHOSPHORUS: Phosphorus: 2.9 mg/dL (ref 2.5–4.6)

## 2021-10-05 LAB — MAGNESIUM: Magnesium: 1.5 mg/dL — ABNORMAL LOW (ref 1.7–2.4)

## 2021-10-05 MED ORDER — MAGNESIUM SULFATE 2 GM/50ML IV SOLN
2.0000 g | Freq: Once | INTRAVENOUS | Status: AC
  Administered 2021-10-05: 2 g via INTRAVENOUS
  Filled 2021-10-05: qty 50

## 2021-10-05 NOTE — Assessment & Plan Note (Signed)
No withdrawal symptoms.  Out of window now.  Continue vitamin supplementation ?

## 2021-10-05 NOTE — Progress Notes (Signed)
Mobility Specialist - Progress Note ? ? ? 10/05/21 1400  ?Mobility  ?Activity Ambulated independently in hallway;Stood at bedside;Dangled on edge of bed  ?Level of Assistance Independent  ?Assistive Device Other (Comment) ?(IV Pole)  ?Distance Ambulated (ft) 500 ft  ?Activity Response Tolerated well  ?$Mobility charge 1 Mobility  ? ? ?Post-mobility: 122 HR,  98% SPO2 ? ?Pt supine upon arrival using RA with family at bedside. Pt completes bed mobility to sit EOB ModI and STS indep. Pt ambulates to bathroom and performs peri care indep. Completes ambulation indep using IV pole for stability. Pt returns EOB with needs in reach and family at bedside. ? ?Maria [name] ?Mobility Specialist ?10/05/21, 3:02 PM ? ? ?

## 2021-10-05 NOTE — Progress Notes (Signed)
[facility] Progress Note ? ?Patient: Maria [name]    FVC:[id number]  DOA: 09/13/2021    ?Date of Service: the patient was seen and examined on 10/05/2021 ? ?Brief hospital course: ?51 y.o. female with past medical history of hypertension, depression, alcohol abuse with secondary pancreatitis admitted on 09/13/2021 under general surgery due to pneumoperitoneum secondary to perforated peptic ulcer.  Patient was taken to the OR s/p ex lap with primary closure of perforated peptic ulcer with Phillip Heal patch repair with intra-abdominal abscess drainage.  Postop, patient became unresponsive so code stroke was called but work-up was negative.  Patient was in the ICU, downgraded to the medical floor on 09/16/2021 to hospitalist service. ? ?Patient underwent another exploratory laparotomy on 09/17/2021 when peptic ulcer perforation repair was redone along with G and J-tube placement, intra-abdominal abscess was also drained.  Started on TPN.  By 3/25, alkaline phosphatase and total bili is starting to trend upward and hemoglobin trending downward.  Patient transfused blood.  CT of abdomen on 3/28 notes small persistent leak with concern of subcapsular hematoma.  IR placed a percutaneous drain on 3/28.  General surgery assumed care.  On 4/1, patient transitioned from TPN to tube feeds.  Hospitalists were reconsulted for persistent anemia on 4/5. ? ? ? ?Assessment and Plan: ?Assessment and Plan: ?* Acute postoperative anemia due to expected blood loss ?Patient received 2 unit of PRBC when her hemoglobin dropped to 6.4 after second surgery.  She also received 1 unit of FFP due to INR of 1.7, followed by 2 more units of FFP by surgery.  Due to slowly declining hemoglobin she received another 2 unit of PRBC.  Total of 4 unit given so far.  Hemoglobin 7.7 on 4/5, but improved to 8.0 today. ?Anemia panel consistent with anemia of chronic disease.  No deficiencies. ?Further drop in H&H posttransfusion may be hemodilutional and  related to frequent blood draws.  We will monitor, transfuse for hemoglobin below 7. ? ? ?Perforated peptic ulcer ([place]) ?With pneumoperitoneum and intra-abdominal abscesses s/p exploratory laparotomy twice as first time she was continued to have leak.  First surgery was on 09/13/2021 and second was on 09/17/2021.  Status post percutaneous drain for persistent leak.  Now off of antibiotics. ? ?Essential hypertension ?Blood pressure now within goal after making hydralazine as scheduled.   Echocardiogram normal. ? ?Vitamin D deficiency ?Vitamin D level at 11.5. ?-We will start vitamin D 50,000 units p.o. weekly once able to take p.o. ? ?Alcohol abuse ?No withdrawal symptoms.  Out of window now.  Continue vitamin supplementation ? ?Malnutrition of moderate degree ?Nutrition Status: ?Nutrition Problem: Moderate Malnutrition ?Etiology: social / environmental circumstances (etoh abuse) ?Signs/Symptoms: mild fat depletion, moderate fat depletion, moderate muscle depletion, severe muscle depletion ?  ? ?Finally transitioning from TPN to tube feeds ? ? ?AKI (acute kidney injury) ([place]) ?Resolved with IV fluids ? ?Hypernatremia ?Resolved with D5.  Following sodium ? ? ? ? ? ? ?Body mass index is 21.25 kg/m?Marland Kitchen  ?Nutrition Problem: Moderate Malnutrition ?Etiology: social / environmental circumstances (etoh abuse) ?   ? ?Consultants: ?Hospitalists ?Interventional radiology ? ?Procedures: ?Surgical repair of perforated ulcer ?Redo repair with G and J-tube placement ?Percutaneous drainage tube by IR ? ?Antimicrobials: ?Completed 10-day course of Zosyn ? ?Code Status: Full code ? ? ?Subjective: Patient states headache is better.  Tolerating tube feedings ? ?Objective: ?Mild tachycardia noted ?Vitals:  ? 10/05/21 0630 10/05/21 0734  ?BP: (!) 142/65 135/64  ?Pulse: (!) 103 (!) 110  ?Resp:  18  ?Temp: 99.1 ?F (37.3 ?C) 98.7 ?F (37.1 ?C)  ?SpO2: 96% 100%  ? ? ?Intake/Output Summary (Last 24 hours) at 10/05/2021 1453 ?Last data filed at  10/05/2021 1402 ?Gross per 24 hour  ?Intake 4838.37 ml  ?Output 25 ml  ?Net 4813.37 ml  ? ?Filed Weights  ? 09/25/21 0514 10/02/21 0745 10/03/21 0500  ?Weight: 52.9 kg 52.8 kg 52.7 kg  ? ?Body mass index is 21.25 kg/m?. ? ?Exam: ? ?General: Alert and oriented x3, no acute distress ?HEENT: Normocephalic, atraumatic, mucous membranes slightly dry ?Cardiovascular: Regular rhythm, borderline tachycardia ?Respiratory: Moderate inspiratory effort ?Abdomen: G J-tube noted, hypoactive bowel sounds ?Musculoskeletal: No clubbing or cyanosis, 1+ pitting edema ?Psychiatry: Appropriate, no evidence of psychoses ?Neurology: No focal deficits ? ?Data Reviewed: ?Hemoglobin improved ? ?Disposition:  ?Status is: Inpatient ?Remains inpatient appropriate because: Setting up home health and tube feeds ?  ? ?Anticipated discharge date: 4/7 - 4/9 ? ?Remaining issues to be resolved so that patient can be discharged: Setting up at home ? ? ?Family Communication: Left message for family ?DVT Prophylaxis: ?SCD's Start: 09/18/21 0216 ? ? ? ?Author: ?Annita Brod ,MD ?10/05/2021 2:53 PM ? ?To reach On-call, see care teams to locate the attending and reach out via [email or web address]. ?Between 7PM-7AM, please contact night-coverage ?If you still have difficulty reaching the attending provider, please page the [facility] (Director on Call) for [facility] on amion for assistance. ? ?

## 2021-10-05 NOTE — Assessment & Plan Note (Signed)
Blood pressure now within goal after making hydralazine as scheduled.   Echocardiogram normal. ?

## 2021-10-05 NOTE — Progress Notes (Signed)
Occupational Therapy Treatment ?Patient Details ?Name: Maria [name] ?MRN: [id number] ?DOB: [birth year] ?Today's Date: 10/05/2021 ? ? ?History of present illness Pt is a 51 year old female admitted on on 09/13/2021 under general surgery due to pneumoperitoneum secondary to perforated peptic ulcer, patient was taken to the OR s/p ex lap with primary closure of perforated peptic ulcer with Phillip Heal patch repair with intra-abdominal abscess drainage.  Postop patient became unresponsive so code stroke was called but work-up was negative. Patient underwent another exploratory laparotomy on 09/17/2021 when peptic ulcer perforation repair was redone along with G and J-tube placement, intra-abdominal abscess was also drained. TPN started 09/18/21. ?  ?OT comments ? Upon entering the room, pt seated in bed with mother present. Pt just finished ambulation with mobility tech of 500' independently. OT reviewing goals and pt reports performing self care tasks within room without assistance. OT encouraged pt to continue ambulation on her own and even with her mother to increase strength and endurance. Pt educated on energy conservation strategies for home and pt reports engaging in several of these while in hospital. Pt is at goal level and no longer needs skilled OT intervention. She agrees and has no further concerns at this time. OT to SIGN OFF.   ? ?Recommendations for follow up therapy are one component of a multi-disciplinary discharge planning process, led by the attending physician.  Recommendations may be updated based on patient status, additional functional criteria and insurance authorization. ?   ?Follow Up Recommendations ? No OT follow up  ?  ?Assistance Recommended at Discharge Intermittent Supervision/Assistance  ?Patient can return home with the following ? A little help with walking and/or transfers;A little help with bathing/dressing/bathroom;Assistance with cooking/housework ?  ?Equipment Recommendations ? None  recommended by OT  ?  ?   ?Precautions / Restrictions Precautions ?Precautions: Fall ?Precaution Comments: feeding tube, jp drains  ? ? ?  ? ?Mobility Bed Mobility ?Overal bed mobility: Modified Independent ?  ?  ?  ?  ?  ?  ?  ?  ? ?Transfers ?Overall transfer level: Modified independent ?  ?  ?  ?  ?  ?  ?  ?  ?  ?  ?  ?Balance Overall balance assessment: Needs assistance ?Sitting-balance support: No upper extremity supported, Feet supported ?Sitting balance-Leahy Scale: Normal ?Sitting balance - Comments: steady sitting reaching within BOS ?  ?Standing balance support: No upper extremity supported, During functional activity ?Standing balance-Leahy Scale: Good ?Standing balance comment: good balance reaching outside BOS to grasp grooming items ?  ?  ?  ?  ?  ?  ?  ?  ?  ?  ?  ?   ? ?ADL either performed or assessed with clinical judgement  ? ?ADL   ?  ?  ?  ?  ?  ?  ?  ?  ?  ?  ?  ?  ?  ?  ?  ?  ?  ?  ?  ?General ADL Comments: mod I with self care tasks ?  ? ?Extremity/Trunk Assessment Upper Extremity Assessment ?Upper Extremity Assessment: Generalized weakness ?  ?Lower Extremity Assessment ?Lower Extremity Assessment: Generalized weakness ?  ?Cervical / Trunk Assessment ?Cervical / Trunk Assessment: Normal ?  ? ?Vision Patient Visual Report: No change from baseline ?  ?  ?   ?   ? ?Cognition Arousal/Alertness: Awake/alert ?Behavior During Therapy: [facility] for tasks assessed/performed ?Overall Cognitive Status: Within Functional Limits for tasks assessed ?  ?  ?  ?  ?  ?  ?  ?  ?  ?  ?  ?  ?  ?  ?  ?  ?  General Comments: alert and oriented x4 ?  ?  ?   ?   ?   ?   ? ? ?Pertinent Vitals/ Pain       Pain Assessment ?Pain Assessment: No/denies pain ? ?   ?   ? ?Frequency ? Min 2X/week  ? ? ? ? ?  ?Progress Toward Goals ? ?OT Goals(current goals can now be found in the care plan section) ? Progress towards OT goals: Progressing toward goals ? ?Acute Rehab OT Goals ?Patient Stated Goal: to get stronger ?OT Goal  Formulation: With patient ?Time For Goal Achievement: 10/13/21 ?Potential to Achieve Goals: Good  ?Plan Frequency remains appropriate;Discharge plan needs to be updated   ? ?   ?AM-PAC OT "6 Clicks" Daily Activity     ?Outcome Measure ? ? Help from another person eating meals?: Total ?Help from another person taking care of personal grooming?: None ?Help from another person toileting, which includes using toliet, bedpan, or urinal?: None ?Help from another person bathing (including washing, rinsing, drying)?: A Little ?Help from another person to put on and taking off regular upper body clothing?: None ?Help from another person to put on and taking off regular lower body clothing?: A Little ?6 Click Score: 19 ? ?  ?End of Session Equipment Utilized During Treatment: Other (comment) (IV pole) ? ?OT Visit Diagnosis: Unsteadiness on feet (R26.81);Muscle weakness (generalized) (M62.81) ?  ?Activity Tolerance Patient tolerated treatment well ?  ?Patient Left in bed;with call bell/phone within reach;with family/visitor present ?  ?Nurse Communication Mobility status ?  ? ?   ? ?Time: 7169-6789 ?OT Time Calculation (min): 11 min ? ?Charges: OT General Charges ?$OT Visit: 1 Visit ?OT Treatments ?$Self Care/Home Management : 8-22 mins ? ?Darleen Crocker, MS, OTR/L , CBIS ?ascom [phone]  ?10/05/21, 3:21 PM  ?

## 2021-10-05 NOTE — Assessment & Plan Note (Signed)
Resolved with D5.  Following sodium ?

## 2021-10-05 NOTE — Assessment & Plan Note (Signed)
Resolved with IV fluids 

## 2021-10-05 NOTE — TOC Progression Note (Signed)
Transition of Care (TOC) - Progression Note  ? ? ?Patient Details  ?Name: Maria [name] ?MRN: [id number] ?Date of Birth: 14-Dec-1970 ? ?Transition of Care (TOC) CM/SW Contact  ?Beverly Sessions, RN ?Phone Number: ?10/05/2021, 9:38 AM ? ?Clinical Narrative:    ?Reached out out via secure chat to MD and dietician "If/when determined patient will need tube feeds for home. Just let us know and we will start the process of arranging that"  ? ? ?Expected Discharge Plan: Home/Self Care ?Barriers to Discharge: Continued Medical Work up ? ?Expected Discharge Plan and Services ?Expected Discharge Plan: Home/Self Care ?  ?Discharge Planning Services: CM Consult ?  ?Living arrangements for the past 2 months: [place] ?                ?DME Arranged: N/A ?DME Agency: NA ?  ?  ?  ?HH Arranged: NA ?[place] Agency: NA ?  ?  ?  ? ? ?Social Determinants of Health (SDOH) Interventions ?  ? ?Readmission Risk Interventions ?   ? View : No data to display.  ?  ?  ?  ? ? ?

## 2021-10-05 NOTE — Assessment & Plan Note (Addendum)
With pneumoperitoneum and intra-abdominal abscesses s/p exploratory laparotomy twice as first time she was continued to have leak.  First surgery was on 09/13/2021 and second was on 09/17/2021.  Status post percutaneous drain for persistent leak.  Now off of antibiotics.  Follow-up CT on 4/11 planned and if improved, drain to be removed. ?

## 2021-10-05 NOTE — Assessment & Plan Note (Signed)
Nutrition Status: ?Nutrition Problem: Moderate Malnutrition ?Etiology: social / environmental circumstances (etoh abuse) ?Signs/Symptoms: mild fat depletion, moderate fat depletion, moderate muscle depletion, severe muscle depletion ?  ? ?Finally transitioning from TPN to tube feeds ? ?

## 2021-10-05 NOTE — Progress Notes (Signed)
Subjective:  ?CC: ?Maria [name] is a 51 y.o. female  Hospital stay day 21, 18 Days Post-Op exploratory laparotomy, redo peptic ulcer perforation repair, G and J tube placement. ? ?HPI: ?No acute issues overnight reported.  G tube to gravity. Tolerating tube feeds at current rate. ? ?ROS:  ?General: Denies weight loss, weight gain, fatigue, fevers, chills, and night sweats. ?Heart: Denies chest pain, palpitations, racing heart, irregular heartbeat, leg pain or swelling, and decreased activity tolerance. ?Respiratory: Denies breathing difficulty, shortness of breath, wheezing, cough, and sputum. ?GI: Denies change in appetite, heartburn, nausea, vomiting, constipation, diarrhea, and blood in stool. ?GU: Denies difficulty urinating, pain with urinating, urgency, frequency, blood in urine. ? ? ?Objective:  ? ?Temp:  [98.7 ?F (37.1 ?C)-99.1 ?F (37.3 ?C)] 98.7 ?F (37.1 ?C) (04/06 0734) ?Pulse Rate:  [99-110] 110 (04/06 0734) ?Resp:  [18] 18 (04/06 0734) ?BP: (134-142)/(62-65) 135/64 (04/06 0734) ?SpO2:  [96 %-100 %] 100 % (04/06 0734)     Height: '5\' 2"'$  (157.5 cm) Weight: 52.7 kg BMI (Calculated): 21.24  ? ?Intake/Output this shift:  ? ?Intake/Output Summary (Last 24 hours) at 10/05/2021 1120 ?Last data filed at 10/05/2021 0300 ?Gross per 24 hour  ?Intake 3958.41 ml  ?Output 12 ml  ?Net 3946.41 ml  ? ? ?Constitutional :  alert, cooperative, appears stated age, and no distress  ?Respiratory:  clear to auscultation bilaterally  ?Cardiovascular:  regular rate and rhythm  ?Gastrointestinal: Soft, no guarding, JP with scant drainage. IR drain with old bloody drainage. G tube with scant bilious output. J tube to tube feeds. No TTP  ?Skin: Cool and moist. Staples c/d/i  ?Psychiatric: Normal affect, non-agitated, not confused  ?   ?  ?LABS:  ? ?  Latest Ref Rng & Units 10/05/2021  ?  5:38 AM 10/02/2021  ?  6:02 AM 09/29/2021  ?  6:30 AM  ?CMP  ?Glucose 70 - 99 mg/dL 130   125   136    ?BUN 6 - 20 mg/dL '25   23   25    '$ ?Creatinine 0.44 -  1.00 mg/dL 0.67   0.72   0.74    ?Sodium 135 - 145 mmol/L 136   135   136    ?Potassium 3.5 - 5.1 mmol/L 4.1   3.6   4.0    ?Chloride 98 - 111 mmol/L 106   107   106    ?CO2 22 - 32 mmol/L '24   24   22    '$ ?Calcium 8.9 - 10.3 mg/dL 8.7   8.7   9.0    ?Total Protein 6.5 - 8.1 g/dL 7.1   7.1     ?Total Bilirubin 0.3 - 1.2 mg/dL 0.6   0.9     ?Alkaline Phos 38 - 126 U/L 206   192     ?AST 15 - 41 U/L 30   19     ?ALT 0 - 44 U/L 20   13     ? ? ?  Latest Ref Rng & Units 10/05/2021  ?  5:38 AM 10/04/2021  ?  6:03 AM 10/02/2021  ?  6:02 AM  ?CBC  ?WBC 4.0 - 10.5 K/uL 8.0   6.2   5.9    ?Hemoglobin 12.0 - 15.0 g/dL 8.0   7.7   7.7    ?Hematocrit 36.0 - 46.0 % 24.6   23.6   24.5    ?Platelets 150 - 400 K/uL 210   190   173    ? ? ?  RADS: ?N/a ? ?Assessment:  ? ?S/p exploratory laparotomy, redo peptic ulcer perforation repair, G and J tube placement, due to persistent leak after intial graham patch repair ? ? Stable with LUQ gtube to gravity. Ice chips. Increaseing Tube feeds rate via J tube. Continue as tolerated and stop TPN .  Having BM.  May consider switching to nocturnal feeds in preparation for d/c and continuing tube feeds at home. Appreciate pharmacy and dietary recs. ? ?Continue q8hr flush RUQ IR DRAIN to maintain patency.  Can consider removal in the next few days if output remains minimal. ? ?CT stable, slight improvement? Consider repeat to follow 10/10/21 ? ?labs/images/medications/previous chart entries reviewed personally and relevant changes/updates noted above. ? ? ?

## 2021-10-06 DIAGNOSIS — I10 Essential (primary) hypertension: Secondary | ICD-10-CM | POA: Diagnosis not present

## 2021-10-06 DIAGNOSIS — K668 Other specified disorders of peritoneum: Secondary | ICD-10-CM

## 2021-10-06 DIAGNOSIS — D62 Acute posthemorrhagic anemia: Secondary | ICD-10-CM | POA: Diagnosis not present

## 2021-10-06 DIAGNOSIS — N179 Acute kidney failure, unspecified: Secondary | ICD-10-CM | POA: Diagnosis not present

## 2021-10-06 LAB — CBC
HCT: 24.6 % — ABNORMAL LOW (ref 36.0–46.0)
Hemoglobin: 7.7 g/dL — ABNORMAL LOW (ref 12.0–15.0)
MCH: 31 pg (ref 26.0–34.0)
MCHC: 31.3 g/dL (ref 30.0–36.0)
MCV: 99.2 fL (ref 80.0–100.0)
Platelets: 209 10*3/uL (ref 150–400)
RBC: 2.48 MIL/uL — ABNORMAL LOW (ref 3.87–5.11)
RDW: 15.9 % — ABNORMAL HIGH (ref 11.5–15.5)
WBC: 8.7 10*3/uL (ref 4.0–10.5)
nRBC: 0 % (ref 0.0–0.2)

## 2021-10-06 LAB — GLUCOSE, CAPILLARY
Glucose-Capillary: 114 mg/dL — ABNORMAL HIGH (ref 70–99)
Glucose-Capillary: 126 mg/dL — ABNORMAL HIGH (ref 70–99)
Glucose-Capillary: 135 mg/dL — ABNORMAL HIGH (ref 70–99)
Glucose-Capillary: 134 mg/dL — ABNORMAL HIGH (ref 70–99)

## 2021-10-06 LAB — BASIC METABOLIC PANEL
Anion gap: 9 (ref 5–15)
BUN: 21 mg/dL — ABNORMAL HIGH (ref 6–20)
CO2: 23 mmol/L (ref 22–32)
Calcium: 8.5 mg/dL — ABNORMAL LOW (ref 8.9–10.3)
Chloride: 102 mmol/L (ref 98–111)
Creatinine, Ser: 0.66 mg/dL (ref 0.44–1.00)
GFR, Estimated: >60 mL/min (ref >60)
Glucose, Bld: 128 mg/dL — ABNORMAL HIGH (ref 70–99)
Potassium: 3.9 mmol/L (ref 3.5–5.1)
Sodium: 134 mmol/L — ABNORMAL LOW (ref 135–145)

## 2021-10-06 NOTE — Progress Notes (Signed)
[facility] Progress Note ? ?Patient: Maria [name]    BBC:[id number]  DOA: 09/13/2021    ?Date of Service: the patient was seen and examined on 10/06/2021 ? ?Brief hospital course: ?51 y.o. female with past medical history of hypertension, depression, alcohol abuse with secondary pancreatitis admitted on 09/13/2021 under general surgery due to pneumoperitoneum secondary to perforated peptic ulcer.  Patient was taken to the OR s/p ex lap with primary closure of perforated peptic ulcer with Phillip Heal patch repair with intra-abdominal abscess drainage.  Postop, patient became unresponsive so code stroke was called but work-up was negative.  Patient was in the ICU, downgraded to the medical floor on 09/16/2021 to hospitalist service. ? ?Patient underwent another exploratory laparotomy on 09/17/2021 when peptic ulcer perforation repair was redone along with G and J-tube placement, intra-abdominal abscess was also drained.  Started on TPN.  By 3/25, alkaline phosphatase and total bili is starting to trend upward and hemoglobin trending downward.  Patient transfused blood.  CT of abdomen on 3/28 notes small persistent leak with concern of subcapsular hematoma.  IR placed a percutaneous drain on 3/28.  General surgery assumed care.  On 4/1, patient transitioned from TPN to tube feeds.  Hospitalists were reconsulted for persistent anemia on 4/5. ? ? ? ?Assessment and Plan: ?Assessment and Plan: ?* Acute postoperative anemia due to expected blood loss ?Patient received 2 unit of PRBC when her hemoglobin dropped to 6.4 after second surgery.  She also received 1 unit of FFP due to INR of 1.7, followed by 2 more units of FFP by surgery.  Due to slowly declining hemoglobin she received another 2 unit of PRBC.  Total of 4 unit given so far.  Hemoglobin was minimal decreased from previous day. ?Anemia panel consistent with anemia of chronic disease.  No deficiencies. ?Further drop in H&H posttransfusion may be hemodilutional and  related to frequent blood draws.  We will monitor, transfuse for hemoglobin below 7. ? ? ?Perforated peptic ulcer ([place]) ?With pneumoperitoneum and intra-abdominal abscesses s/p exploratory laparotomy twice as first time she was continued to have leak.  First surgery was on 09/13/2021 and second was on 09/17/2021.  Status post percutaneous drain for persistent leak.  Now off of antibiotics.  Follow-up CT on 4/11 planned and if improved, drain to be removed. ? ?Essential hypertension ?Blood pressure now within goal after making hydralazine as scheduled.   Echocardiogram normal. ? ?Vitamin D deficiency ?Vitamin D level at 11.5. ?-We will start vitamin D 50,000 units p.o. weekly once able to take p.o. ? ?Alcohol abuse ?No withdrawal symptoms.  Out of window now.  Continue vitamin supplementation ? ?Malnutrition of moderate degree ?Nutrition Status: ?Nutrition Problem: Moderate Malnutrition ?Etiology: social / environmental circumstances (etoh abuse) ?Signs/Symptoms: mild fat depletion, moderate fat depletion, moderate muscle depletion, severe muscle depletion ?  ? ?Finally transitioning from TPN to tube feeds ? ? ?AKI (acute kidney injury) ([place]) ?Resolved with IV fluids ? ?Hypernatremia ?Resolved with D5.  Following sodium ? ? ? ? ? ? ?Body mass index is 21.69 kg/m?Marland Kitchen  ?Nutrition Problem: Moderate Malnutrition ?Etiology: social / environmental circumstances (etoh abuse) ?   ? ?Consultants: ?Hospitalists ?Interventional radiology ? ?Procedures: ?Surgical repair of perforated ulcer ?Redo repair with G and J-tube placement ?Percutaneous drainage tube by IR ? ?Antimicrobials: ?Completed 10-day course of Zosyn ? ?Code Status: Full code ? ? ?Subjective: Patient doing okay, no complaints ? ?Objective: ?Mild tachycardia noted ?Vitals:  ? 10/06/21 0212 10/06/21 0801  ?BP: (!) 133/54 120/62  ?Pulse: Marland Kitchen)  106 (!) 104  ?Resp: 20 16  ?Temp: 98.8 ?F (37.1 ?C) 99.7 ?F (37.6 ?C)  ?SpO2: 99% 98%  ? ? ?Intake/Output Summary (Last 24 hours)  at 10/06/2021 1243 ?Last data filed at 10/06/2021 0536 ?Gross per 24 hour  ?Intake 1922.55 ml  ?Output 50 ml  ?Net 1872.55 ml  ? ?Filed Weights  ? 10/02/21 0745 10/03/21 0500 10/06/21 0349  ?Weight: 52.8 kg 52.7 kg 53.8 kg  ? ?Body mass index is 21.69 kg/m?. ? ?Exam: ? ?General: Alert and oriented x3, no acute distress ?HEENT: Normocephalic, atraumatic, mucous membranes slightly dry ?Cardiovascular: Regular rhythm, borderline tachycardia ?Respiratory: Moderate inspiratory effort ?Abdomen: G J-tube noted, hypoactive bowel sounds ?Musculoskeletal: No clubbing or cyanosis, 1+ pitting edema ?Psychiatry: Appropriate, no evidence of psychoses ?Neurology: No focal deficits ? ?Data Reviewed: ?Hemoglobin with slight decrease from previous day ? ?Disposition:  ?Status is: Inpatient ?Remains inpatient appropriate because: Follow-up CT planned to see if drain can be removed ?  ? ?Anticipated discharge date: 4/12 ? ?Remaining issues to be resolved so that patient can be discharged: Removal of drain ? ? ?Family Communication: Left message for family ?DVT Prophylaxis: ?SCD's Start: 09/18/21 0216 ? ? ? ?Author: ?Annita Brod ,MD ?10/06/2021 12:43 PM ? ?To reach On-call, see care teams to locate the attending and reach out via [email or web address]. ?Between 7PM-7AM, please contact night-coverage ?If you still have difficulty reaching the attending provider, please page the [facility] (Director on Call) for [facility] on amion for assistance. ? ?

## 2021-10-06 NOTE — Progress Notes (Signed)
Mobility Specialist - Progress Note ? ? 10/06/21 1200  ?Mobility  ?Activity Ambulated independently in hallway;Dangled on edge of bed  ?Level of Assistance Independent  ?Assistive Device Other (Comment) ?(IV Pole)  ?Distance Ambulated (ft) 600 ft  ?Activity Response Tolerated well  ?$Mobility charge 1 Mobility  ? ? ? ?Pt standing in bathroom upon arrival using RA. Pt completes bathroom pericare indep and ambulates 655f indep voicing no complaints --- mild SOB noted. Pt returned to bed with needs in reach. ? ?MMerrily Brittle?Mobility Specialist ?10/06/21, 12:59 PM ? ? ? ? ?

## 2021-10-06 NOTE — TOC Progression Note (Signed)
Transition of Care (TOC) - Progression Note  ? ? ?Patient Details  ?Name: Maria [name] ?MRN: [id number] ?Date of Birth: 10/28/1970 ? ?Transition of Care (TOC) CM/SW Contact  ?Laurena Slimmer, RN ?Phone Number: ?10/06/2021, 10:18 AM ? ?Clinical Narrative:    ?Spoke with patient about HHRN for tube feedings and co-pays depending on insurance coverage. Patient states her husband is an EMT and she would speak with him first prior to making a decision. Provided her with a list of [facility] agencies in her area.  ? ? ? ?Expected Discharge Plan: Home/Self Care ?Barriers to Discharge: Continued Medical Work up ? ?Expected Discharge Plan and Services ?Expected Discharge Plan: Home/Self Care ?  ?Discharge Planning Services: CM Consult ?  ?Living arrangements for the past 2 months: [place] ?                ?DME Arranged: N/A ?DME Agency: NA ?  ?  ?  ?HH Arranged: NA ?[place] Agency: NA ?  ?  ?  ? ? ?Social Determinants of Health (SDOH) Interventions ?  ? ?Readmission Risk Interventions ?   ? View : No data to display.  ?  ?  ?  ? ? ?

## 2021-10-06 NOTE — Progress Notes (Addendum)
Subjective:  ?CC: ?Maria [name] is a 51 y.o. female  Hospital stay day 22, 19 Days Post-Op exploratory laparotomy, redo peptic ulcer perforation repair, G and J tube placement. ? ?HPI: ?No acute issues overnight reported.  G tube to gravity. Tolerating tube feeds at 98m/hr. ? ?ROS:  ?General: Denies weight loss, weight gain, fatigue, fevers, chills, and night sweats. ?Heart: Denies chest pain, palpitations, racing heart, irregular heartbeat, leg pain or swelling, and decreased activity tolerance. ?Respiratory: Denies breathing difficulty, shortness of breath, wheezing, cough, and sputum. ?GI: Denies change in appetite, heartburn, nausea, vomiting, constipation, diarrhea, and blood in stool. ?GU: Denies difficulty urinating, pain with urinating, urgency, frequency, blood in urine. ? ? ?Objective:  ? ?Temp:  [98.2 ?F (36.8 ?C)-99.7 ?F (37.6 ?C)] 99.7 ?F (37.6 ?C) (04/07 0801) ?Pulse Rate:  [102-110] 104 (04/07 0801) ?Resp:  [16-20] 16 (04/07 0801) ?BP: (120-138)/(54-64) 120/62 (04/07 0801) ?SpO2:  [97 %-99 %] 98 % (04/07 0801) ?Weight:  [53.8 kg] 53.8 kg (04/07 0349)     Height: '5\' 2"'$  (157.5 cm) Weight: 53.8 kg BMI (Calculated): 21.69  ? ?Intake/Output this shift:  ? ?Intake/Output Summary (Last 24 hours) at 10/06/2021 1205 ?Last data filed at 10/06/2021 0536 ?Gross per 24 hour  ?Intake 1922.55 ml  ?Output 50 ml  ?Net 1872.55 ml  ? ? ?Constitutional :  alert, cooperative, appears stated age, and no distress  ?Respiratory:  clear to auscultation bilaterally  ?Cardiovascular:  regular rate and rhythm  ?Gastrointestinal: Soft, no guarding, JP with scant drainage. IR drain with old bloody drainage. G tube with scant bilious output. J tube to tube feeds. No TTP  ?Skin: Cool and moist. Staples c/d/I. Bulge over superior portion of wound stable.  Size of golf ball.  ?Psychiatric: Normal affect, non-agitated, not confused  ?   ?  ?LABS:  ? ?  Latest Ref Rng & Units 10/06/2021  ?  6:00 AM 10/05/2021  ?  5:38 AM 10/02/2021  ?  6:02 AM   ?CMP  ?Glucose 70 - 99 mg/dL 128   130   125    ?BUN 6 - 20 mg/dL '21   25   23    '$ ?Creatinine 0.44 - 1.00 mg/dL 0.66   0.67   0.72    ?Sodium 135 - 145 mmol/L 134   136   135    ?Potassium 3.5 - 5.1 mmol/L 3.9   4.1   3.6    ?Chloride 98 - 111 mmol/L 102   106   107    ?CO2 22 - 32 mmol/L '23   24   24    '$ ?Calcium 8.9 - 10.3 mg/dL 8.5   8.7   8.7    ?Total Protein 6.5 - 8.1 g/dL  7.1   7.1    ?Total Bilirubin 0.3 - 1.2 mg/dL  0.6   0.9    ?Alkaline Phos 38 - 126 U/L  206   192    ?AST 15 - 41 U/L  30   19    ?ALT 0 - 44 U/L  20   13    ? ? ?  Latest Ref Rng & Units 10/06/2021  ?  6:00 AM 10/05/2021  ?  5:38 AM 10/04/2021  ?  6:03 AM  ?CBC  ?WBC 4.0 - 10.5 K/uL 8.7   8.0   6.2    ?Hemoglobin 12.0 - 15.0 g/dL 7.7   8.0   7.7    ?Hematocrit 36.0 - 46.0 % 24.6   24.6  23.6    ?Platelets 150 - 400 K/uL 209   210   190    ? ? ?RADS: ?N/a ? ?Assessment:  ? ?S/p exploratory laparotomy, redo peptic ulcer perforation repair, G and J tube placement, due to persistent leak after intial graham patch repair ? ? Stable with LUQ gtube to gravity. Will clamp to see if she tolerates over weekend. continue Ice chips only for now. Tube feeds at goal rate.  TPN stopped yesterday. Will continue for few more days prior to considering nocturnal feeds.  Appreciate pharmacy and dietary recs.  Can consider PICC removal in next day or two if no issues. ? ?Continue q8hr flush RUQ IR DRAIN to maintain patency.  Can consider removal in the next few days if output remains minimal and if f/u C/T shows decreasing fluid collection. ? ?Last CT stable, slight improvement? Consider repeat to follow 10/10/21 ? ?labs/images/medications/previous chart entries reviewed personally and relevant changes/updates noted above. ? ? ?

## 2021-10-07 LAB — GLUCOSE, CAPILLARY
Glucose-Capillary: 121 mg/dL — ABNORMAL HIGH (ref 70–99)
Glucose-Capillary: 129 mg/dL — ABNORMAL HIGH (ref 70–99)
Glucose-Capillary: 132 mg/dL — ABNORMAL HIGH (ref 70–99)
Glucose-Capillary: 144 mg/dL — ABNORMAL HIGH (ref 70–99)
Glucose-Capillary: 150 mg/dL — ABNORMAL HIGH (ref 70–99)

## 2021-10-07 LAB — CBC
HCT: 24.2 % — ABNORMAL LOW (ref 36.0–46.0)
Hemoglobin: 7.8 g/dL — ABNORMAL LOW (ref 12.0–15.0)
MCH: 32 pg (ref 26.0–34.0)
MCHC: 32.2 g/dL (ref 30.0–36.0)
MCV: 99.2 fL (ref 80.0–100.0)
Platelets: 199 10*3/uL (ref 150–400)
RBC: 2.44 MIL/uL — ABNORMAL LOW (ref 3.87–5.11)
RDW: 15.8 % — ABNORMAL HIGH (ref 11.5–15.5)
WBC: 8.9 10*3/uL (ref 4.0–10.5)
nRBC: 0 % (ref 0.0–0.2)

## 2021-10-07 NOTE — Progress Notes (Signed)
Mobility Specialist - Progress Note ? ? ? 10/07/21 1200  ?Mobility  ?Activity Ambulated independently in hallway;Stood at bedside;Dangled on edge of bed  ?Level of Assistance Independent  ?Assistive Device Other (Comment) ?(IV Pole)  ?Distance Ambulated (ft) 400 ft  ?Activity Response Tolerated well  ?$Mobility charge 1 Mobility  ? ? ?Pt supine upon arrival using RA. Pt completes bed mobility and STS indep and ambulates to bathroom for pericare. Pt continues ambulates indep and voices no complaints. Pt returns to bed with needs in reach. ? ?Maria [name] ?Mobility Specialist ?10/07/21, 1:02 PM ? ? ? ? ?

## 2021-10-07 NOTE — Progress Notes (Signed)
Patient ID: Maria [name], female   DOB: Sep 07, 1970, 51 y.o.   MRN: [id number] ?    SURGICAL PROGRESS NOTE  ? ?Hospital Day(s): 23.  ? ?Interval History: Patient seen and examined, no acute events or new complaints overnight. Patient reports no new complaints.  She denies any abdominal pain.  She feels well with the tube feedings.  She denies any bloating or any pain from the tube feedings.  She endorses passing gas and having bowel movement.  She denies any nausea or vomiting.  She felt comfortable with the shift.  She has not had any fevers. ? ?Vital signs in last 24 hours: [min-max] current  ?Temp:  [98.5 ?F (36.9 ?C)-99.2 ?F (37.3 ?C)] 98.8 ?F (37.1 ?C) (04/08 9937) ?Pulse Rate:  [93-107] 107 (04/08 0825) ?Resp:  [16-20] 20 (04/08 1696) ?BP: (125-142)/(61-71) 142/71 (04/08 7893) ?SpO2:  [98 %-99 %] 98 % (04/08 0825) ?Weight:  [55.7 kg] 55.7 kg (04/08 0500)     Height: '5\' 2"'$  (157.5 cm) Weight: 55.7 kg BMI (Calculated): 22.45  ? ?Physical Exam:  ?Constitutional: alert, cooperative and no distress  ?Respiratory: breathing non-labored at rest  ?Cardiovascular: regular rate and sinus rhythm  ?Gastrointestinal: soft, non-tender, and non-distended.  Soft bulge in the upper part of the incision, no skin changes. ? ?Labs:  ? ?  Latest Ref Rng & Units 10/07/2021  ?  5:00 AM 10/06/2021  ?  6:00 AM 10/05/2021  ?  5:38 AM  ?CBC  ?WBC 4.0 - 10.5 K/uL 8.9   8.7   8.0    ?Hemoglobin 12.0 - 15.0 g/dL 7.8   7.7   8.0    ?Hematocrit 36.0 - 46.0 % 24.2   24.6   24.6    ?Platelets 150 - 400 K/uL 199   209   210    ? ? ?  Latest Ref Rng & Units 10/06/2021  ?  6:00 AM 10/05/2021  ?  5:38 AM 10/02/2021  ?  6:02 AM  ?CMP  ?Glucose 70 - 99 mg/dL 128   130   125    ?BUN 6 - 20 mg/dL '21   25   23    '$ ?Creatinine 0.44 - 1.00 mg/dL 0.66   0.67   0.72    ?Sodium 135 - 145 mmol/L 134   136   135    ?Potassium 3.5 - 5.1 mmol/L 3.9   4.1   3.6    ?Chloride 98 - 111 mmol/L 102   106   107    ?CO2 22 - 32 mmol/L '23   24   24    '$ ?Calcium 8.9 - 10.3 mg/dL 8.5    8.7   8.7    ?Total Protein 6.5 - 8.1 g/dL  7.1   7.1    ?Total Bilirubin 0.3 - 1.2 mg/dL  0.6   0.9    ?Alkaline Phos 38 - 126 U/L  206   192    ?AST 15 - 41 U/L  30   19    ?ALT 0 - 44 U/L  20   13    ? ? ?Imaging studies: No new pertinent imaging studies ? ? ?Assessment/Plan:  ?51 y.o. female with duodenal perforation s/p repair complicated with leak from the repair with reopening of laparotomy and the repair of the duodenal perforation with persistently. ? ?-Patient without any clinical deterioration ?-No fever and stable vital signs ?-No new complaints, no abdominal pain tolerating tube feedings ?-We will continue with current management with  enteral feedings to goal, physical therapy, drains in place. ?-Patient might need repeating CT on 10/10/2021 for reevaluation of fluid collections ?-Patient encouraged to ambulate ? ? ? ?Gwendolyn Grant, MD ? ? ? ?

## 2021-10-08 DIAGNOSIS — E440 Moderate protein-calorie malnutrition: Secondary | ICD-10-CM | POA: Diagnosis not present

## 2021-10-08 DIAGNOSIS — D62 Acute posthemorrhagic anemia: Secondary | ICD-10-CM | POA: Diagnosis not present

## 2021-10-08 DIAGNOSIS — K275 Chronic or unspecified peptic ulcer, site unspecified, with perforation: Secondary | ICD-10-CM | POA: Diagnosis not present

## 2021-10-08 LAB — CBC
HCT: 23.4 % — ABNORMAL LOW (ref 36.0–46.0)
Hemoglobin: 7.5 g/dL — ABNORMAL LOW (ref 12.0–15.0)
MCH: 31.3 pg (ref 26.0–34.0)
MCHC: 32.1 g/dL (ref 30.0–36.0)
MCV: 97.5 fL (ref 80.0–100.0)
Platelets: 194 10*3/uL (ref 150–400)
RBC: 2.4 MIL/uL — ABNORMAL LOW (ref 3.87–5.11)
RDW: 15.7 % — ABNORMAL HIGH (ref 11.5–15.5)
WBC: 8.4 10*3/uL (ref 4.0–10.5)
nRBC: 0 % (ref 0.0–0.2)

## 2021-10-08 LAB — GLUCOSE, CAPILLARY
Glucose-Capillary: 128 mg/dL — ABNORMAL HIGH (ref 70–99)
Glucose-Capillary: 107 mg/dL — ABNORMAL HIGH (ref 70–99)
Glucose-Capillary: 158 mg/dL — ABNORMAL HIGH (ref 70–99)

## 2021-10-08 MED ORDER — HYDROMORPHONE HCL 1 MG/ML IJ SOLN
0.5000 mg | INTRAMUSCULAR | Status: DC | PRN
  Administered 2021-10-08 – 2021-10-09 (×7): 0.5 mg via INTRAVENOUS
  Filled 2021-10-08 (×7): qty 0.5

## 2021-10-08 MED ORDER — ACETAMINOPHEN 10 MG/ML IV SOLN
1000.0000 mg | Freq: Four times a day (QID) | INTRAVENOUS | Status: AC
  Administered 2021-10-08 (×2): 1000 mg via INTRAVENOUS
  Filled 2021-10-08 (×2): qty 100

## 2021-10-08 NOTE — Plan of Care (Signed)
?  Problem: Nutrition: ?Goal: Adequate nutrition will be maintained ?Outcome: Progressing ?  ?Problem: Elimination: ?Goal: Will not experience complications related to bowel motility ?Outcome: Progressing ?Goal: Will not experience complications related to urinary retention ?Outcome: Progressing ?  ?Problem: Safety: ?Goal: Ability to remain free from injury will improve ?Outcome: Progressing ?  ?Problem: Skin Integrity: ?Goal: Risk for impaired skin integrity will decrease ?Outcome: Progressing ?  ?Problem: Clinical Measurements: ?Goal: Postoperative complications will be avoided or minimized ?Outcome: Progressing ?  ?Problem: Skin Integrity: ?Goal: Demonstration of wound healing without infection will improve ?Outcome: Progressing ?  ?

## 2021-10-08 NOTE — Progress Notes (Signed)
[facility] Progress Note ? ?Patient: Maria [name]    FXT:[id number]  DOA: 09/13/2021    ?Date of Service: the patient was seen and examined on 10/08/2021 ? ?Brief hospital course: ?51 y.o. female with past medical history of hypertension, depression, alcohol abuse with secondary pancreatitis admitted on 09/13/2021 under general surgery due to pneumoperitoneum secondary to perforated peptic ulcer.  Patient was taken to the OR s/p ex lap with primary closure of perforated peptic ulcer with Phillip Heal patch repair with intra-abdominal abscess drainage.  Postop, patient became unresponsive so code stroke was called but work-up was negative.  Patient was in the ICU, downgraded to the medical floor on 09/16/2021 to hospitalist service. ? ?Patient underwent another exploratory laparotomy on 09/17/2021 when peptic ulcer perforation repair was redone along with G and J-tube placement, intra-abdominal abscess was also drained.  Started on TPN.  By 3/25, alkaline phosphatase and total bili is starting to trend upward and hemoglobin trending downward.  Patient transfused blood.  CT of abdomen on 3/28 notes small persistent leak with concern of subcapsular hematoma.  IR placed a percutaneous drain on 3/28.  General surgery assumed care.  On 4/1, patient transitioned from TPN to tube feeds.  Hospitalists were reconsulted for persistent anemia on 4/5. ? ? ? ?Assessment and Plan: ?Assessment and Plan: ?* Acute postoperative anemia due to expected blood loss ?Patient received 2 unit of PRBC when her hemoglobin dropped to 6.4 after second surgery.  She also received 1 unit of FFP due to INR of 1.7, followed by 2 more units of FFP by surgery.  Due to slowly declining hemoglobin she received another 2 unit of PRBC.  Total of 4 unit given so far.  Hemoglobin has remained stable over the past few days, currently at 7.5.  Anemia panel consistent with anemia of chronic disease.  No deficiencies. ?Further drop in H&H posttransfusion may be  hemodilutional and related to frequent blood draws.  We will monitor, transfuse for hemoglobin below 7. ? ? ?Perforated peptic ulcer ([place]) ?With pneumoperitoneum and intra-abdominal abscesses s/p exploratory laparotomy twice as first time she was continued to have leak.  First surgery was on 09/13/2021 and second was on 09/17/2021.  Status post percutaneous drain for persistent leak.  Now off of antibiotics.  Follow-up CT on 4/11 planned and if improved, drain to be removed. ? ?Essential hypertension ?Blood pressure now within goal after making hydralazine as scheduled.   Echocardiogram normal. ? ?Vitamin D deficiency ?Vitamin D level at 11.5. ?-We will start vitamin D 50,000 units p.o. weekly once able to take p.o. ? ?Alcohol abuse ?No withdrawal symptoms.  Out of window now.  Continue vitamin supplementation ? ?Malnutrition of moderate degree ?Nutrition Status: ?Nutrition Problem: Moderate Malnutrition ?Etiology: social / environmental circumstances (etoh abuse) ?Signs/Symptoms: mild fat depletion, moderate fat depletion, moderate muscle depletion, severe muscle depletion ?  ? ?Finally transitioning from TPN to tube feeds ? ? ?AKI (acute kidney injury) ([place]) ?Resolved with IV fluids ? ?Hypernatremia ?Resolved with D5.  Following sodium ? ? ? ? ? ? ?Body mass index is 21.41 kg/m?Marland Kitchen  ?Nutrition Problem: Moderate Malnutrition ?Etiology: social / environmental circumstances (etoh abuse) ?   ? ?Consultants: ?Hospitalists ?Interventional radiology ? ?Procedures: ?Surgical repair of perforated ulcer ?Redo repair with G and J-tube placement ?Percutaneous drainage tube by IR ? ?Antimicrobials: ?Completed 10-day course of Zosyn ? ?Code Status: Full code ? ? ?Subjective: Patient doing okay, complains of mild headache ? ?Objective: ?Mild tachycardia noted ?Vitals:  ? 10/08/21 0357 10/08/21  0745  ?BP: 120/60 (!) 127/57  ?Pulse: 90 (!) 103  ?Resp: 16 18  ?Temp: 98.2 ?F (36.8 ?C) 98.8 ?F (37.1 ?C)  ?SpO2: 100% 100%   ? ? ?Intake/Output Summary (Last 24 hours) at 10/08/2021 1348 ?Last data filed at 10/08/2021 1257 ?Gross per 24 hour  ?Intake 1759.74 ml  ?Output 40 ml  ?Net 1719.74 ml  ? ?Filed Weights  ? 10/06/21 0349 10/07/21 0500 10/08/21 0500  ?Weight: 53.8 kg 55.7 kg 53.1 kg  ? ?Body mass index is 21.41 kg/m?. ? ?Exam: ? ?General: Alert and oriented x3, no acute distress ?HEENT: Normocephalic, atraumatic, mucous membranes slightly dry ?Cardiovascular: Regular rhythm, borderline tachycardia ?Respiratory: Moderate inspiratory effort ?Abdomen: G J-tube noted, hypoactive bowel sounds ?Musculoskeletal: No clubbing or cyanosis, 1+ pitting edema ?Psychiatry: Appropriate, no evidence of psychoses ?Neurology: No focal deficits ? ?Data Reviewed: ?Hemoglobin with slight decrease from previous day ? ?Disposition:  ?Status is: Inpatient ?Remains inpatient appropriate because: Follow-up CT planned to see if drain can be removed ?  ? ?Anticipated discharge date: 4/12 ? ?Remaining issues to be resolved so that patient can be discharged: Removal of drain ? ? ?Family Communication: Left message for family ?DVT Prophylaxis: ?SCD's Start: 09/18/21 0216 ? ? ? ?Author: ?Annita Brod ,MD ?10/08/2021 1:48 PM ? ?To reach On-call, see care teams to locate the attending and reach out via [email or web address]. ?Between 7PM-7AM, please contact night-coverage ?If you still have difficulty reaching the attending provider, please page the [facility] (Director on Call) for [facility] on amion for assistance. ? ?

## 2021-10-08 NOTE — Progress Notes (Signed)
Patient ID: Maria [name], female   DOB: 26-Oct-1970, 51 y.o.   MRN: [id number] ?    SURGICAL PROGRESS NOTE  ? ?Hospital Day(s): 24.  ? ?Interval History: Patient seen and examined, no acute events or new complaints overnight. Patient reports having headaches.  She denies abdominal pain.  She denies any problem with enteral feedings.  She endorses passing gas. ? ?Vital signs in last 24 hours: [min-max] current  ?Temp:  [98.1 ?F (36.7 ?C)-98.8 ?F (37.1 ?C)] 98.8 ?F (37.1 ?C) (04/09 0745) ?Pulse Rate:  [90-106] 103 (04/09 0745) ?Resp:  [16-20] 18 (04/09 0745) ?BP: (120-132)/(57-71) 127/57 (04/09 0745) ?SpO2:  [96 %-100 %] 100 % (04/09 0745) ?Weight:  [53.1 kg] 53.1 kg (04/09 0500)     Height: '5\' 2"'$  (157.5 cm) Weight: 53.1 kg BMI (Calculated): 21.41  ? ?Physical Exam:  ?Constitutional: alert, cooperative and no distress  ?Respiratory: breathing non-labored at rest  ?Cardiovascular: regular rate and sinus rhythm  ?Gastrointestinal: soft, non-tender, and non-distended ? ?Labs:  ? ?  Latest Ref Rng & Units 10/08/2021  ?  5:45 AM 10/07/2021  ?  5:00 AM 10/06/2021  ?  6:00 AM  ?CBC  ?WBC 4.0 - 10.5 K/uL 8.4   8.9   8.7    ?Hemoglobin 12.0 - 15.0 g/dL 7.5   7.8   7.7    ?Hematocrit 36.0 - 46.0 % 23.4   24.2   24.6    ?Platelets 150 - 400 K/uL 194   199   209    ? ? ?  Latest Ref Rng & Units 10/06/2021  ?  6:00 AM 10/05/2021  ?  5:38 AM 10/02/2021  ?  6:02 AM  ?CMP  ?Glucose 70 - 99 mg/dL 128   130   125    ?BUN 6 - 20 mg/dL '21   25   23    '$ ?Creatinine 0.44 - 1.00 mg/dL 0.66   0.67   0.72    ?Sodium 135 - 145 mmol/L 134   136   135    ?Potassium 3.5 - 5.1 mmol/L 3.9   4.1   3.6    ?Chloride 98 - 111 mmol/L 102   106   107    ?CO2 22 - 32 mmol/L '23   24   24    '$ ?Calcium 8.9 - 10.3 mg/dL 8.5   8.7   8.7    ?Total Protein 6.5 - 8.1 g/dL  7.1   7.1    ?Total Bilirubin 0.3 - 1.2 mg/dL  0.6   0.9    ?Alkaline Phos 38 - 126 U/L  206   192    ?AST 15 - 41 U/L  30   19    ?ALT 0 - 44 U/L  20   13    ? ? ?Imaging studies: No new pertinent imaging  studies ? ? ?Assessment/Plan:  ?51 y.o. female with duodenal perforation s/p repair complicated with leak from the repair with reopening of laparotomy and the repair of the duodenal perforation with persistently. ? ?-There has been no clinical duration ?-Patient is still complaining of headaches.  She is strictly n.p.o. due to duodenal ulcer leak.  We will order IV Tylenol for headaches. ?-She is tolerating enteral feedings ?-I encouraged the patient to ambulate ?-We will continue with current management ? ?Gwendolyn Grant, MD ? ? ? ?

## 2021-10-09 LAB — GLUCOSE, CAPILLARY
Glucose-Capillary: 116 mg/dL — ABNORMAL HIGH (ref 70–99)
Glucose-Capillary: 101 mg/dL — ABNORMAL HIGH (ref 70–99)

## 2021-10-09 MED ORDER — OSMOLITE 1.5 CAL PO LIQD
910.0000 mL | ORAL | Status: AC
  Administered 2021-10-09: 910 mL

## 2021-10-09 MED ORDER — PROSOURCE TF PO LIQD
45.0000 mL | Freq: Every day | ORAL | Status: DC
  Administered 2021-10-10: 45 mL
  Filled 2021-10-09 (×2): qty 45

## 2021-10-09 MED ORDER — HYDROMORPHONE HCL 1 MG/ML IJ SOLN
0.5000 mg | INTRAMUSCULAR | Status: DC | PRN
  Administered 2021-10-09 – 2021-10-10 (×5): 0.5 mg via INTRAVENOUS
  Filled 2021-10-09 (×5): qty 0.5

## 2021-10-09 MED ORDER — ACETAMINOPHEN 10 MG/ML IV SOLN
1000.0000 mg | Freq: Once | INTRAVENOUS | Status: AC
  Administered 2021-10-09: 1000 mg via INTRAVENOUS
  Filled 2021-10-09: qty 100

## 2021-10-09 MED ORDER — FREE WATER
30.0000 mL | Freq: Every day | Status: DC
  Administered 2021-10-09 – 2021-10-11 (×11): 30 mL

## 2021-10-09 MED ORDER — OSMOLITE 1.5 CAL PO LIQD
1190.0000 mL | ORAL | Status: DC
Start: 2021-10-10 — End: 2021-10-11
  Administered 2021-10-10: 1190 mL

## 2021-10-09 MED ORDER — ORAL CARE MOUTH RINSE
15.0000 mL | Freq: Two times a day (BID) | OROMUCOSAL | Status: DC
  Administered 2021-10-10 (×2): 15 mL via OROMUCOSAL

## 2021-10-09 MED ORDER — CHLORHEXIDINE GLUCONATE 0.12 % MT SOLN
15.0000 mL | Freq: Two times a day (BID) | OROMUCOSAL | Status: DC
  Administered 2021-10-09 – 2021-10-11 (×5): 15 mL via OROMUCOSAL
  Filled 2021-10-09 (×5): qty 15

## 2021-10-09 NOTE — Progress Notes (Signed)
Subjective:  ?CC: ?Maria [name] is a 51 y.o. female  Hospital stay day 25, 22 Days Post-Op exploratory laparotomy, redo peptic ulcer perforation repair, G and J tube placement. ? ?HPI: ?Episode of nausea this pm, with some abdominal pain. Tube feeds have been stopped. ? ?ROS:  ?General: Denies weight loss, weight gain, fatigue, fevers, chills, and night sweats. ?Heart: Denies chest pain, palpitations, racing heart, irregular heartbeat, leg pain or swelling, and decreased activity tolerance. ?Respiratory: Denies breathing difficulty, shortness of breath, wheezing, cough, and sputum. ?GI: Denies change in appetite, heartburn, nausea, vomiting, constipation, diarrhea, and blood in stool. ?GU: Denies difficulty urinating, pain with urinating, urgency, frequency, blood in urine. ? ? ?Objective:  ? ?Temp:  [97.7 ?F (36.5 ?C)-99 ?F (37.2 ?C)] 99 ?F (37.2 ?C) (04/10 0845) ?Pulse Rate:  [85-102] 98 (04/10 1355) ?Resp:  [16-18] 18 (04/10 0845) ?BP: (114-135)/(59-70) 135/70 (04/10 1355) ?SpO2:  [98 %-100 %] 99 % (04/10 0845)     Height: '5\' 2"'$  (157.5 cm) Weight: 53.1 kg BMI (Calculated): 21.41  ? ?Intake/Output this shift:  ? ?Intake/Output Summary (Last 24 hours) at 10/09/2021 1416 ?Last data filed at 10/09/2021 0600 ?Gross per 24 hour  ?Intake --  ?Output 0 ml  ?Net 0 ml  ? ? ?Constitutional :  alert, cooperative, appears stated age, and no distress  ?Respiratory:  clear to auscultation bilaterally  ?Cardiovascular:  regular rate and rhythm  ?Gastrointestinal: Soft, no guarding, JP with scant drainage. IR drain with old bloody drainage. G tube and J tube clamped. No TTP.  ?Skin: Cool and moist. Staples c/d/I. Bulge over superior portion of wound slightly smaller than golf ball today.  ?Psychiatric: Normal affect, non-agitated, not confused  ?   ?  ?LABS:  ? ?  Latest Ref Rng & Units 10/06/2021  ?  6:00 AM 10/05/2021  ?  5:38 AM 10/02/2021  ?  6:02 AM  ?CMP  ?Glucose 70 - 99 mg/dL 128   130   125    ?BUN 6 - 20 mg/dL '21   25   23     '$ ?Creatinine 0.44 - 1.00 mg/dL 0.66   0.67   0.72    ?Sodium 135 - 145 mmol/L 134   136   135    ?Potassium 3.5 - 5.1 mmol/L 3.9   4.1   3.6    ?Chloride 98 - 111 mmol/L 102   106   107    ?CO2 22 - 32 mmol/L '23   24   24    '$ ?Calcium 8.9 - 10.3 mg/dL 8.5   8.7   8.7    ?Total Protein 6.5 - 8.1 g/dL  7.1   7.1    ?Total Bilirubin 0.3 - 1.2 mg/dL  0.6   0.9    ?Alkaline Phos 38 - 126 U/L  206   192    ?AST 15 - 41 U/L  30   19    ?ALT 0 - 44 U/L  20   13    ? ? ?  Latest Ref Rng & Units 10/08/2021  ?  5:45 AM 10/07/2021  ?  5:00 AM 10/06/2021  ?  6:00 AM  ?CBC  ?WBC 4.0 - 10.5 K/uL 8.4   8.9   8.7    ?Hemoglobin 12.0 - 15.0 g/dL 7.5   7.8   7.7    ?Hematocrit 36.0 - 46.0 % 23.4   24.2   24.6    ?Platelets 150 - 400 K/uL 194   199  209    ? ? ?RADS: ?N/a ? ?Assessment:  ? ?S/p exploratory laparotomy, redo peptic ulcer perforation repair, G and J tube placement, due to persistent leak after intial graham patch repair ? ?New onset abd pain and nausea, one time.  Exam remained unremarkable though, with no WBC either.  Will resume g tube to gravity.  Still try nocturnal feeds tonight. ? ?Continue q8hr flush RUQ IR DRAIN to maintain patency.  Can consider removal in the next few days if output remains minimal and if f/u C/T shows decreasing fluid collection. ? ?Last CT stable, slight improvement? repeat to follow 10/10/21 ? ?labs/images/medications/previous chart entries reviewed personally and relevant changes/updates noted above. ? ? ?

## 2021-10-09 NOTE — Progress Notes (Addendum)
Nutrition Follow Up Note  ? ?DOCUMENTATION CODES:  ? ?Non-severe (moderate) malnutrition in context of social or environmental circumstances ? ?INTERVENTION:  ? ?Change to nocturnal feeds of Osmolite 1.5@85ml/hr x 14 hrs (1190ml/day run from 1800-0800) ? ?Pro-Source 45ml daily via tube, provides 40kcal and 11g of protein per serving  ? ?Free water flushes 30ml q4 hours to maintain tube patency  ? ?Regimen provides 1825kcal/day, 86g/day protein and 1087ml/day of free water  ? ?Vitamin D 50,000 units po weekly x 8 weeks ? ?NUTRITION DIAGNOSIS:  ? 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 ? ?GOAL:  ? ?Patient will meet greater than or equal to 90% of their needs ?-met  ? ?MONITOR:  ? ?Labs, Weight trends, TF tolerance, Skin, I & O's ? ?ASSESSMENT:  ? ?51 y/o female with h/o GERD, anxiety, depression, etoh abuse and HTN who is admitted with perforated duodenal ulcer s/p Graham patch repair 3/15. ? ?Pt s/p exploratory laparotomy, primary closure of perforated peptic ulcer Graham patch repair, intraabdominal abscess drainage, 14F gastrostomy tube insertion, Jejunostomy tube insertion (18 French Foley catheter) 3/19 ? ?Met with pt in room today. Pt tolerating tube feeds well at goal rate; tube feeds were stopped this morning in preparation to start nocturnal feeds tonight. Pt reports nausea today that started after receiving ProSource TF; RD will change this to be given at night with her tube feeds as pt previously tolerating it. Plan for tonight will be to increase tube feed rate to 65ml/hr x 14 hrs and if pt tolerates, will plan to increase to goal rate of 85ml/hr tomorrow night. Repeat NFPE from today looks similar to initial presentation. Per chart, pt appears weight stable since admission. Spoke with MD, plan is for repeat CT scan tomorrow and possible initiation of  clear liquid diet if everything looks ok. Will plan to start vitamin D supplementation with oral diet. Last BM noted on 4/9. Drains with 10ml output.  ? ?Of note, pt with vitamin D deficiency; will plan to supplement once patient can resume oral nutrition.   ? ?Medications reviewed and include: folic acid, insulin, nicotine, protonix ? ?Labs reviewed: Na 134(L), K 3.9 wnl, BUN 21(H), P 2.9 wnl, Mg 1.5(L) ?Vitamin D 11.51(L)- 3/18 ?Wbc- Hgb 7.5(L), Hct 23.4(L) ?Cbgs- 158, 107, 128 x 48 hrs ? ?Diet Order:   ?Diet Order   ? ?       ?  Diet NPO time specified Except for: Ice Chips, Sips with Meds  Diet effective now       ?  ? ?  ?  ? ?  ? ?EDUCATION NEEDS:  ? ?No education needs have been identified at this time ? ?Skin:  Skin Assessment: Skin Integrity Issues: ?Skin Integrity Issues:: Incisions ?Incisions: closed abdomen ? ?Last BM:  4/9 ? ?Height:  ? ?Ht Readings from Last 1 Encounters:  ?09/14/21 5' 2" (1.575 m)  ? ? ?Weight:  ? ?Wt Readings from Last 1 Encounters:  ?10/08/21 53.1 kg  ? ? ?Ideal Body Weight:  50 kg ? ?BMI:  Body mass index is 21.41 kg/m?. ? ?Estimated Nutritional Needs:  ? ?Kcal:  1600-1800kcal/day ? ?Protein:  80-90g/day ? ?Fluid:  1.6-1.8L/day ? ?Casey Campbell MS, RD, LDN ?Please refer to AMION for RD and/or RD on-call/weekend/after hours pager ? ?

## 2021-10-09 NOTE — TOC Progression Note (Addendum)
Transition of Care (TOC) - Progression Note  ? ? ?Patient Details  ?Name: Maria [name] ?MRN: [id number] ?Date of Birth: [birth year] ? ?Transition of Care (TOC) CM/SW Contact  ?Candie Chroman, LCSW ?Phone Number: ?10/09/2021, 10:24 AM ? ?Clinical Narrative:  Met with patient and her mother to discuss potential for home health nurse at discharge. They asked if physician felt like she needed one at discharge. Sent secure chat. They understand that with commercial insurance, she will have high copays per visit. They want to be sure they have all the education needed to manage her needs at home prior to discharge. Once response obtained from physician, will start home health search if he is agreeable.  ? ?3:30 pm: [facility] and [facility] are unable to accept referral. [facility] is reviewing. ? ?Expected Discharge Plan: Home/Self Care ?Barriers to Discharge: Continued Medical Work up ? ?Expected Discharge Plan and Services ?Expected Discharge Plan: Home/Self Care ?  ?Discharge Planning Services: CM Consult ?  ?Living arrangements for the past 2 months: [place] ?                ?DME Arranged: N/A ?DME Agency: NA ?  ?  ?  ?HH Arranged: NA ?[place] Agency: NA ?  ?  ?  ? ? ?Social Determinants of Health (SDOH) Interventions ?  ? ?Readmission Risk Interventions ?   ? View : No data to display.  ?  ?  ?  ? ? ?

## 2021-10-10 ENCOUNTER — Inpatient Hospital Stay: Payer: BC Managed Care – PPO

## 2021-10-10 ENCOUNTER — Encounter: Payer: Self-pay | Admitting: Surgery

## 2021-10-10 DIAGNOSIS — K275 Chronic or unspecified peptic ulcer, site unspecified, with perforation: Secondary | ICD-10-CM | POA: Diagnosis not present

## 2021-10-10 DIAGNOSIS — E440 Moderate protein-calorie malnutrition: Secondary | ICD-10-CM | POA: Diagnosis not present

## 2021-10-10 DIAGNOSIS — D62 Acute posthemorrhagic anemia: Secondary | ICD-10-CM | POA: Diagnosis not present

## 2021-10-10 LAB — GLUCOSE, CAPILLARY
Glucose-Capillary: 109 mg/dL — ABNORMAL HIGH (ref 70–99)
Glucose-Capillary: 140 mg/dL — ABNORMAL HIGH (ref 70–99)
Glucose-Capillary: 92 mg/dL (ref 70–99)
Glucose-Capillary: 93 mg/dL (ref 70–99)

## 2021-10-10 LAB — CBC
HCT: 24.2 % — ABNORMAL LOW (ref 36.0–46.0)
Hemoglobin: 7.8 g/dL — ABNORMAL LOW (ref 12.0–15.0)
MCH: 32.1 pg (ref 26.0–34.0)
MCHC: 32.2 g/dL (ref 30.0–36.0)
MCV: 99.6 fL (ref 80.0–100.0)
Platelets: 194 10*3/uL (ref 150–400)
RBC: 2.43 MIL/uL — ABNORMAL LOW (ref 3.87–5.11)
RDW: 15.3 % (ref 11.5–15.5)
WBC: 6.7 10*3/uL (ref 4.0–10.5)
nRBC: 0 % (ref 0.0–0.2)

## 2021-10-10 LAB — BASIC METABOLIC PANEL
Anion gap: 6 (ref 5–15)
BUN: 22 mg/dL — ABNORMAL HIGH (ref 6–20)
CO2: 27 mmol/L (ref 22–32)
Calcium: 8.8 mg/dL — ABNORMAL LOW (ref 8.9–10.3)
Chloride: 103 mmol/L (ref 98–111)
Creatinine, Ser: 0.73 mg/dL (ref 0.44–1.00)
GFR, Estimated: >60 mL/min (ref >60)
Glucose, Bld: 123 mg/dL — ABNORMAL HIGH (ref 70–99)
Potassium: 4.2 mmol/L (ref 3.5–5.1)
Sodium: 136 mmol/L (ref 135–145)

## 2021-10-10 MED ORDER — TRAMADOL HCL 50 MG PO TABS
50.0000 mg | ORAL_TABLET | Freq: Four times a day (QID) | ORAL | Status: DC | PRN
  Administered 2021-10-10: 50 mg via ORAL
  Filled 2021-10-10 (×2): qty 1

## 2021-10-10 MED ORDER — GABAPENTIN 600 MG PO TABS
300.0000 mg | ORAL_TABLET | Freq: Two times a day (BID) | ORAL | Status: DC
  Administered 2021-10-10 – 2021-10-11 (×2): 300 mg via ORAL
  Filled 2021-10-10 (×2): qty 1

## 2021-10-10 MED ORDER — NICOTINE 7 MG/24HR TD PT24
7.0000 mg | MEDICATED_PATCH | Freq: Every day | TRANSDERMAL | Status: DC
  Administered 2021-10-11: 7 mg via TRANSDERMAL
  Filled 2021-10-10: qty 1

## 2021-10-10 MED ORDER — IOHEXOL 300 MG/ML  SOLN
100.0000 mL | Freq: Once | INTRAMUSCULAR | Status: AC | PRN
  Administered 2021-10-10: 100 mL via INTRAVENOUS

## 2021-10-10 MED ORDER — GABAPENTIN 600 MG PO TABS
300.0000 mg | ORAL_TABLET | Freq: Once | ORAL | Status: AC
  Administered 2021-10-10: 300 mg via ORAL
  Filled 2021-10-10: qty 1

## 2021-10-10 MED ORDER — PANTOPRAZOLE SODIUM 40 MG PO TBEC
40.0000 mg | DELAYED_RELEASE_TABLET | Freq: Every day | ORAL | Status: DC
  Administered 2021-10-11: 40 mg via ORAL
  Filled 2021-10-10 (×2): qty 1

## 2021-10-10 MED ORDER — BUTALBITAL-APAP-CAFFEINE 50-325-40 MG PO TABS
1.0000 | ORAL_TABLET | Freq: Four times a day (QID) | ORAL | Status: DC | PRN
  Administered 2021-10-10 – 2021-10-11 (×4): 1 via ORAL
  Filled 2021-10-10 (×4): qty 1

## 2021-10-10 MED ORDER — VITAMIN D (ERGOCALCIFEROL) 1.25 MG (50000 UNIT) PO CAPS
50000.0000 [IU] | ORAL_CAPSULE | ORAL | Status: DC
  Administered 2021-10-10: 50000 [IU] via ORAL
  Filled 2021-10-10: qty 1

## 2021-10-10 MED ORDER — SUCRALFATE 1 GM/10ML PO SUSP
1.0000 g | Freq: Three times a day (TID) | ORAL | Status: DC
  Administered 2021-10-10 – 2021-10-11 (×4): 1 g via ORAL
  Filled 2021-10-10 (×4): qty 10

## 2021-10-10 NOTE — TOC Progression Note (Signed)
Transition of Care (TOC) - Progression Note  ? ? ?Patient Details  ?Name: Maria [name] ?MRN: [id number] ?Date of Birth: 20-Feb-1971 ? ?Transition of Care (TOC) CM/SW Contact  ?Laurena Slimmer, RN ?Phone Number: ?10/10/2021, 4:39 PM ? ?Clinical Narrative:    ?Spoke with Patient mother to update about [place]. Advised patient would be sent home with 3 day of supplies and [employer] would ship the others. Will follow up with [facility].  ? ? ?Expected Discharge Plan: Home/Self Care ?Barriers to Discharge: Continued Medical Work up ? ?Expected Discharge Plan and Services ?Expected Discharge Plan: Home/Self Care ?  ?Discharge Planning Services: CM Consult ?  ?Living arrangements for the past 2 months: [place] ?                ?DME Arranged: N/A ?DME Agency: NA ?  ?  ?  ?HH Arranged: NA ?[place] Agency: NA ?  ?  ?  ? ? ?Social Determinants of Health (SDOH) Interventions ?  ? ?Readmission Risk Interventions ?   ? View : No data to display.  ?  ?  ?  ? ? ?

## 2021-10-10 NOTE — TOC Progression Note (Addendum)
Transition of Care (TOC) - Progression Note  ? ? ?Patient Details  ?Name: Maria [name] ?MRN: [id number] ?Date of Birth: 1970-07-12 ? ?Transition of Care (TOC) CM/SW Contact  ?Candie Chroman, LCSW ?Phone Number: ?10/10/2021, 9:13 AM ? ?Clinical Narrative: [facility], [facility], and [facility] are unable to accept referral. Left message for [facility] representative.   ? ?12:12 pm: [facility] is able to accept patient but will not be able to start care until next week. Dr. Lysle Pearl is aware. [employer] representative is aware of plan for discharge tomorrow. Dietician will send her home with 3 days worth of formula and supplies. ? ?12:55 pm: [facility] will run her insurance to see if she will have a copay or not. ? ?Expected Discharge Plan: Home/Self Care ?Barriers to Discharge: Continued Medical Work up ? ?Expected Discharge Plan and Services ?Expected Discharge Plan: Home/Self Care ?  ?Discharge Planning Services: CM Consult ?  ?Living arrangements for the past 2 months: [place] ?                ?DME Arranged: N/A ?DME Agency: NA ?  ?  ?  ?HH Arranged: NA ?[place] Agency: NA ?  ?  ?  ? ? ?Social Determinants of Health (SDOH) Interventions ?  ? ?Readmission Risk Interventions ?   ? View : No data to display.  ?  ?  ?  ? ? ?

## 2021-10-10 NOTE — Progress Notes (Signed)
*   Acute postoperative anemia due to expected blood loss in conjunction with chronic disease ?Patient initially received 2 unit of PRBC when her hemoglobin dropped to 6.4 after second surgery.  She also received 1 unit of FFP due to INR of 1.7, followed by 2 more units of FFP by surgery.  Due to slowly declining hemoglobin she received another 2 unit of PRBC.  Total of 4 unit given so far. ? ?Hemoglobin has remained stable over the past few days, currently at 7.5.  Anemia panel consistent with anemia of chronic disease.  No deficiencies.  In review of her iron studies, she has had a normal iron and ferritin with a decreased TIBC. ? ?Therefore, would not recommend iron supplementation and hopefully her hemoglobin will improve as her nutrition stays more consistent.  Repeat iron studies may be warranted in 4 to 6 weeks.  Would not transfuse unless hemoglobin below 7. ?

## 2021-10-10 NOTE — Progress Notes (Signed)
Subjective:  ?CC: ?Maria [name] is a 51 y.o. female  Hospital stay day 26, 23 Days Post-Op exploratory laparotomy, redo peptic ulcer perforation repair, G and J tube placement. ? ?HPI: ?No acute issues overnight.  Tolerated night feeds. Pain in abdomen is vague, but still present. ? ?ROS:  ?General: Denies weight loss, weight gain, fatigue, fevers, chills, and night sweats. ?Heart: Denies chest pain, palpitations, racing heart, irregular heartbeat, leg pain or swelling, and decreased activity tolerance. ?Respiratory: Denies breathing difficulty, shortness of breath, wheezing, cough, and sputum. ?GI: Denies change in appetite, heartburn, nausea, vomiting, constipation, diarrhea, and blood in stool. ?GU: Denies difficulty urinating, pain with urinating, urgency, frequency, blood in urine. ? ? ?Objective:  ? ?Temp:  [98.2 ?F (36.8 ?C)-99.1 ?F (37.3 ?C)] 98.6 ?F (37 ?C) (04/11 0805) ?Pulse Rate:  [90-101] 94 (04/11 0805) ?Resp:  [16-18] 16 (04/11 0805) ?BP: (113-135)/(44-70) 126/57 (04/11 0805) ?SpO2:  [96 %-100 %] 100 % (04/11 0805) ?Weight:  [57.4 kg] 57.4 kg (04/11 0500)     Height: '5\' 2"'$  (157.5 cm) Weight: 57.4 kg BMI (Calculated): 23.14  ? ?Intake/Output this shift:  ? ?Intake/Output Summary (Last 24 hours) at 10/10/2021 1133 ?Last data filed at 10/10/2021 0517 ?Gross per 24 hour  ?Intake 207.86 ml  ?Output 200 ml  ?Net 7.86 ml  ? ? ?Constitutional :  alert, cooperative, appears stated age, and no distress  ?Respiratory:  clear to auscultation bilaterally  ?Cardiovascular:  regular rate and rhythm  ?Gastrointestinal: Soft, no guarding, JP with scant drainage. IR drain with old bloody drainage. G tube and J tube clamped. No TTP.  ?Skin: Cool and moist. Staples c/d/I. Bulge over superior portion of wound slightly smaller  again.  3cm x 2cm tall.  ?Psychiatric: Normal affect, non-agitated, not confused  ?   ?  ?LABS:  ? ?  Latest Ref Rng & Units 10/10/2021  ?  4:50 AM 10/06/2021  ?  6:00 AM 10/05/2021  ?  5:38 AM  ?CMP   ?Glucose 70 - 99 mg/dL 123   128   130    ?BUN 6 - 20 mg/dL '22   21   25    '$ ?Creatinine 0.44 - 1.00 mg/dL 0.73   0.66   0.67    ?Sodium 135 - 145 mmol/L 136   134   136    ?Potassium 3.5 - 5.1 mmol/L 4.2   3.9   4.1    ?Chloride 98 - 111 mmol/L 103   102   106    ?CO2 22 - 32 mmol/L '27   23   24    '$ ?Calcium 8.9 - 10.3 mg/dL 8.8   8.5   8.7    ?Total Protein 6.5 - 8.1 g/dL   7.1    ?Total Bilirubin 0.3 - 1.2 mg/dL   0.6    ?Alkaline Phos 38 - 126 U/L   206    ?AST 15 - 41 U/L   30    ?ALT 0 - 44 U/L   20    ? ? ?  Latest Ref Rng & Units 10/10/2021  ?  4:50 AM 10/08/2021  ?  5:45 AM 10/07/2021  ?  5:00 AM  ?CBC  ?WBC 4.0 - 10.5 K/uL 6.7   8.4   8.9    ?Hemoglobin 12.0 - 15.0 g/dL 7.8   7.5   7.8    ?Hematocrit 36.0 - 46.0 % 24.2   23.4   24.2    ?Platelets 150 - 400 K/uL  194   194   199    ? ? ?RADS: ?N/a ? ?Assessment:  ? ?S/p exploratory laparotomy, redo peptic ulcer perforation repair, G and J tube placement, due to persistent leak after intial graham patch repair ? ?Nausea yesterday due to oral nutrition supplement.  Did ok overnight with minimal g tube output.  Will clamp again. ? ?RLQ drain removed due to no worsening of fluid collection around liver.  ? ?CT scan as above.  Improving. Will resume clears and continue to monitor.  Still continue tube feeds for nutritional support until tolerating enough oral feeds.  JP and G tube  to remain in place until then. ? ?Hopefully home tomorrow if tolerating clears and pain controlled with oral meds only.  HH and supplies set up in progress. ? ?Also f/u on hospitalist recs: anemia  ? ? ?labs/images/medications/previous chart entries reviewed personally and relevant changes/updates noted above. ? ? ?

## 2021-10-10 NOTE — Plan of Care (Signed)
?  Problem: Clinical Measurements: ?Goal: Ability to maintain clinical measurements within normal limits will improve ?Outcome: Progressing ?Goal: Will remain free from infection ?Outcome: Progressing ?Goal: Diagnostic test results will improve ?Outcome: Progressing ?Goal: Respiratory complications will improve ?Outcome: Progressing ?Goal: Cardiovascular complication will be avoided ?Outcome: Progressing ?  ?Problem: Activity: ?Goal: Risk for activity intolerance will decrease ?Outcome: Progressing ?  ?Problem: Elimination: ?Goal: Will not experience complications related to bowel motility ?Outcome: Progressing ?Goal: Will not experience complications related to urinary retention ?Outcome: Progressing ?  ?Problem: Pain Managment: ?Goal: General experience of comfort will improve ?Outcome: Progressing ?  ?Pt is involved in and agrees with the plan of care. V/S stable. Reports pain on her lower abdomen; IV Dilaudid given with some relief. Drainsx2, in place and output measured. J-Feeding ongoing; tolerating well. No complaints of nausea. I Independent in her room.  ?

## 2021-10-10 NOTE — TOC Progression Note (Signed)
Transition of Care (TOC) - Progression Note  ? ? ?Patient Details  ?Name: Maria [name] ?MRN: [id number] ?Date of Birth: 01/20/1971 ? ?Transition of Care (TOC) CM/SW Contact  ?Laurena Slimmer, RN ?Phone Number: ?10/10/2021, 2:51 PM ? ?Clinical Narrative:    ?Patient informed she had been accepted by [facility]. Advised services would start next week. Informed co-pay could be high cost and still being reviewed.  ? ? ?Expected Discharge Plan: Home/Self Care ?Barriers to Discharge: Continued Medical Work up ? ?Expected Discharge Plan and Services ?Expected Discharge Plan: Home/Self Care ?  ?Discharge Planning Services: CM Consult ?  ?Living arrangements for the past 2 months: [place] ?                ?DME Arranged: N/A ?DME Agency: NA ?  ?  ?  ?HH Arranged: NA ?[place] Agency: NA ?  ?  ?  ? ? ?Social Determinants of Health (SDOH) Interventions ?  ? ?Readmission Risk Interventions ?   ? View : No data to display.  ?  ?  ?  ? ? ?

## 2021-10-11 LAB — GLUCOSE, CAPILLARY
Glucose-Capillary: 133 mg/dL — ABNORMAL HIGH (ref 70–99)
Glucose-Capillary: 142 mg/dL — ABNORMAL HIGH (ref 70–99)

## 2021-10-11 MED ORDER — SUCRALFATE 1 GM/10ML PO SUSP: 1.0000 g | Freq: Three times a day (TID) | ORAL | 0 refills | Status: DC

## 2021-10-11 MED ORDER — BUTALBITAL-APAP-CAFFEINE 50-325-40 MG PO TABS: 1.0000 | ORAL_TABLET | Freq: Four times a day (QID) | ORAL | 0 refills | Status: DC | PRN

## 2021-10-11 MED ORDER — OSMOLITE 1.5 CAL PO LIQD: 1190.0000 mL | ORAL | 0 refills | Status: AC

## 2021-10-11 MED ORDER — PROSOURCE TF PO LIQD: 45.0000 mL | Freq: Every day | ORAL | 0 refills | Status: AC

## 2021-10-11 MED ORDER — FREE WATER: 30.0000 mL | 0 refills | Status: AC

## 2021-10-11 MED ORDER — VITAMIN D (ERGOCALCIFEROL) 1.25 MG (50000 UNIT) PO CAPS
50000.0000 [IU] | ORAL_CAPSULE | ORAL | 0 refills | Status: AC
Start: 2021-10-17 — End: 2021-12-12

## 2021-10-11 NOTE — Progress Notes (Signed)
[name] to be D/C'd Home per MD order.  Discussed prescriptions and follow up appointments with the patient and family. Prescriptions given to patient, medication list explained in detail. Pt verbalized understanding. Supplies sent with pt. ? ?Allergies as of 10/11/2021   ? ?   Reactions  ? Anesthesia S-i-40 [propofol]   ? Bee Venom Swelling  ? Codeine Swelling  ? Bupropion Anxiety, Other (See Comments)  ? Worsened anxiety  ? ?  ? ?  ?Medication List  ?  ? ?STOP taking these medications   ? ?cyanocobalamin 2000 MCG tablet ?Commonly known as: CVS VITAMIN B12 ?  ?fluticasone 50 MCG/ACT nasal spray ?Commonly known as: FLONASE ?  ?folic acid 1 MG tablet ?Commonly known as: FOLVITE ?  ?hydrOXYzine 10 MG tablet ?Commonly known as: ATARAX ?  ?meloxicam 15 MG tablet ?Commonly known as: MOBIC ?  ?multivitamin tablet ?  ?thiamine 50 MG tablet ?Commonly known as: VITAMIN B-1 ?  ? ?  ? ?TAKE these medications   ? ?butalbital-acetaminophen-caffeine 50-325-40 MG tablet ?Commonly known as: FIORICET ?Take 1 tablet by mouth every 6 (six) hours as needed for headache. ?  ?dicyclomine 10 MG capsule ?Commonly known as: BENTYL ?Take 1 capsule (10 mg total) by mouth 4 (four) times daily -  before meals and at bedtime for 3 days. ?  ?EPINEPHrine 0.3 mg/0.3 mL Soaj injection ?Commonly known as: EPI-PEN ?Inject 0.3 mLs (0.3 mg total) into the muscle as needed for anaphylaxis. ?  ?feeding supplement (OSMOLITE 1.5 CAL) Liqd ?1,190 mLs by Per J Tube route daily for 14 days. Nocturnal tube feeds- please run at 3m/hr x 14 hours overnight (1800-0800) through pink j tube ?  ?feeding supplement (PROSource TF) liquid ?Place 45 mLs into feeding tube daily for 14 days. ?  ?free water Soln ?Place 30 mLs into feeding tube every 4 (four) hours for 14 days. ?  ?omeprazole 20 MG capsule ?Commonly known as: PRILOSEC ?Take 1 capsule (20 mg total) by mouth daily. ?  ?ondansetron 4 MG tablet ?Commonly known as: Zofran ?Take 1 tablet (4 mg total) by  mouth every 8 (eight) hours as needed for nausea or vomiting. ?  ?sucralfate 1 GM/10ML suspension ?Commonly known as: CARAFATE ?Take 10 mLs (1 g total) by mouth 4 (four) times daily -  with meals and at bedtime. ?  ?Vitamin D (Ergocalciferol) 1.25 MG (50000 UNIT) Caps capsule ?Commonly known as: DRISDOL ?Take 1 capsule (50,000 Units total) by mouth every 7 (seven) days. ?Start taking on: October 17, 2021 ?  ? ?  ? ?  ?  ? ? ?  ?Durable Medical Equipment  ?(From admission, onward)  ?  ? ? ?  ? ?  Start     Ordered  ? 10/11/21 0928  For home use only DME Tube feeding  Once       ?Comments: to nocturnal feeds of Osmolite 1.5'@85ml'$ /hr x 14 hrs (1198mday run from 1800-0800) ?  ?Pro-Source 4578maily via tube, provides 40kcal and 11g of protein per serving  ?  ?Free water flushes 33m41m hours to maintain tube patency  ?  ?Vitamin D 50,000 units po weekly x 8 weeks  ? 10/11/21 [id number]?  ?  ? ?  ? ? ?Vitals:  ? 10/11/21 0810 10/11/21 1549  ?BP: (!) 113/57 121/65  ?Pulse: 88 84  ?Resp: 18 18  ?Temp: 98.3 ?F (36.8 ?C) 98.9 ?F (37.2 ?C)  ?SpO2: 99% 98%  ? ? ?Skin clean, dry and intact  without evidence of skin break down, no evidence of skin tears noted. IV catheter discontinued intact. Site without signs and symptoms of complications. Dressing and pressure applied. Pt denies pain at this time. No complaints noted. ? ?An After Visit Summary was printed and given to the patient. ?Patient escorted via [place], and D/C home via private auto. ? ?Rolley Sims  ?

## 2021-10-11 NOTE — TOC Progression Note (Signed)
Transition of Care (TOC) - Progression Note  ? ? ?Patient Details  ?Name: Lezlie Ritchey ?MRN: [id number] ?Date of Birth: 10-12-1970 ? ?Transition of Care (TOC) CM/SW Contact  ?Candie Chroman, LCSW ?Phone Number: ?10/11/2021, 9:34 AM ? ?Clinical Narrative:  [facility] representative said they will call patient to see if she prefers pump to be delivered to room or her house today. ? ?Expected Discharge Plan: Home/Self Care ?Barriers to Discharge: Continued Medical Work up ? ?Expected Discharge Plan and Services ?Expected Discharge Plan: Home/Self Care ?  ?Discharge Planning Services: CM Consult ?  ?Living arrangements for the past 2 months: [place] ?                ?DME Arranged: N/A ?DME Agency: NA ?  ?  ?  ?HH Arranged: NA ?[place] Agency: NA ?  ?  ?  ? ? ?Social Determinants of Health (SDOH) Interventions ?  ? ?Readmission Risk Interventions ?   ? View : No data to display.  ?  ?  ?  ? ? ?

## 2021-10-11 NOTE — TOC Transition Note (Signed)
Transition of Care (TOC) - CM/SW Discharge Note ? ? ?Patient Details  ?Name: Maria [name] ?MRN: [id number] ?Date of Birth: 1971-02-21 ? ?Transition of Care (TOC) CM/SW Contact:  ?Candie Chroman, LCSW ?Phone Number: ?10/11/2021, 4:07 PM ? ? ?Clinical Narrative:   Patient has orders to discharge home today. [place] representative is aware. [facility] will deliver tube feed pump to the room before she leaves. Dietician brought 5 days worth of tube feed formula to the room and RN is aware she will need to give supplies. No further concerns. CSW signing off. ? ?Final next level of care: [place] ?Barriers to Discharge: Barriers Resolved ? ? ?Patient Goals and CMS Choice ?Patient states their goals for this hospitalization and ongoing recovery are:: Patient would like some ice chips-  to be well and get back home ?  ?Choice offered to / list presented to : Patient ? ?Discharge Placement ?  ?           ?  ?  ?  ?Patient and family notified of of transfer: 10/11/21 ? ?Discharge Plan and Services ?  ?Discharge Planning Services: CM Consult ?           ?DME Arranged: Tube feeding, Tube feeding pump ?DME Agency: [facility] ?Date DME Agency Contacted: 10/11/21 ?  ?Representative spoke with at DME Agency: Andree Coss ?HH Arranged: RN ?[place] Agency: Other - See comment ([place]) ?Date HH Agency Contacted: 10/11/21 ?  ?Representative spoke with at [place]: Oliver Hum ? ?Social Determinants of Health (SDOH) Interventions ?  ? ? ?Readmission Risk Interventions ?   ? View : No data to display.  ?  ?  ?  ? ? ? ? ? ?

## 2021-10-12 NOTE — Discharge Summary (Addendum)
Physician Discharge Summary  ?Patient ID: ?Maria [name] ?MRN: [id number] ?DOB/AGE: July 16, 1970 51 y.o. ? ?Admit date: 09/13/2021 ?Discharge date: 10/11/21 ? ?Admission Diagnoses: Perforated gastric ulcer ? ?Discharge Diagnoses:  ?Same as above, anemia of chronic disease, malnutrition, coagulopathy, wound dehiscence ? ?Discharged Condition: good ? ?Hospital Course: admitted for above. Underwent surgery.  Please see op note for details.  Post op, upper GI study done on day 3 noted none contain leak of contrast throughout entire abdomen, therefore taken emergently back for redo repair.  Please see op note for details.  Required 4 units packed red blood cell transfusion due to increased INR likely secondary from undiagnosed cirrhosis, bleeding eventually stopped.  Repeat study showed persistent leak with dehiscence, but due to the overall contained leak compared to previous study as well as physical and clinical exam being benign, decision was made to forego a third operation and let this heal on its own.  Strict n.p.o. was continued through out most of her hospital stay as we allow this to heal.  TPN initially started, gradually transition to the jejunostomy tube feeds that was placed during the second operation.  The second follow-up imaging also showed a large fluid collection likely from the bleeding episode postop.  This was drained by interventional radiology.  Her hemoglobin remained stable but persistently low in the mid 7 range.  Hospitalist consulted for evaluation, noted likely to have anemia of chronic disease.  Follow-up labs were recommended along with transfusing only if hemoglobin drops below 7.  Prior to discharge, third CT scan showed decreasing size of contained leak. patient remained stable with toleration of small sips of clears.  Decision was made to continue tube feeds to supplement for adequate nutrition to allow continued healing at home while slowing advancing her diet.  Patient was discharged with  home health set up for nocturnal tube feeds.  Gastrostomy tube remained in place for need for decompression as needed.  JP drain in place as well to monitor for any recurrent leaks.  She will continue to follow closely as an outpatient with gradual advancement of diet and eventual removal of all tubes. ? ?Consults:  Hospitalist, interventional radiology ? ?Discharge Exam: ?Blood pressure 121/65, pulse 84, temperature 98.9 ?F (37.2 ?C), temperature source Oral, resp. rate 18, height '5\' 2"'$  (1.575 m), weight 57.2 kg, last menstrual period 02/24/2018, SpO2 98 %. ?General appearance: alert, cooperative, and no distress ?GI: soft, non-tender; bowel sounds normal; no masses,  no organomegaly gastrostomy tube jejunostomy tube in place.  Former IR drain site dressing clean dry and intact.  Midline incision healed, decreasing size of protuberance in the area of noted dehiscence. ? ?Disposition:  ?Discharge disposition: 01-Home or Self Care ? ? ? ? ? ? ? ?Allergies as of 10/11/2021   ? ?   Reactions  ? Anesthesia S-i-40 [propofol]   ? Bee Venom Swelling  ? Codeine Swelling  ? Bupropion Anxiety, Other (See Comments)  ? Worsened anxiety  ? ?  ? ?  ?Medication List  ?  ? ?STOP taking these medications   ? ?cyanocobalamin 2000 MCG tablet ?Commonly known as: CVS VITAMIN B12 ?  ?fluticasone 50 MCG/ACT nasal spray ?Commonly known as: FLONASE ?  ?folic acid 1 MG tablet ?Commonly known as: FOLVITE ?  ?hydrOXYzine 10 MG tablet ?Commonly known as: ATARAX ?  ?meloxicam 15 MG tablet ?Commonly known as: MOBIC ?  ?multivitamin tablet ?  ?thiamine 50 MG tablet ?Commonly known as: VITAMIN B-1 ?  ? ?  ? ?TAKE  these medications   ? ?butalbital-acetaminophen-caffeine 50-325-40 MG tablet ?Commonly known as: FIORICET ?Take 1 tablet by mouth every 6 (six) hours as needed for headache. ?  ?dicyclomine 10 MG capsule ?Commonly known as: BENTYL ?Take 1 capsule (10 mg total) by mouth 4 (four) times daily -  before meals and at bedtime for 3 days. ?   ?EPINEPHrine 0.3 mg/0.3 mL Soaj injection ?Commonly known as: EPI-PEN ?Inject 0.3 mLs (0.3 mg total) into the muscle as needed for anaphylaxis. ?  ?feeding supplement (OSMOLITE 1.5 CAL) Liqd ?1,190 mLs by Per J Tube route daily for 14 days. Nocturnal tube feeds- please run at 89m/hr x 14 hours overnight (1800-0800) through pink j tube ?  ?feeding supplement (PROSource TF) liquid ?Place 45 mLs into feeding tube daily for 14 days. ?  ?free water Soln ?Place 30 mLs into feeding tube every 4 (four) hours for 14 days. ?  ?omeprazole 20 MG capsule ?Commonly known as: PRILOSEC ?Take 1 capsule (20 mg total) by mouth daily. ?  ?ondansetron 4 MG tablet ?Commonly known as: Zofran ?Take 1 tablet (4 mg total) by mouth every 8 (eight) hours as needed for nausea or vomiting. ?  ?sucralfate 1 GM/10ML suspension ?Commonly known as: CARAFATE ?Take 10 mLs (1 g total) by mouth 4 (four) times daily -  with meals and at bedtime. ?  ?Vitamin D (Ergocalciferol) 1.25 MG (50000 UNIT) Caps capsule ?Commonly known as: DRISDOL ?Take 1 capsule (50,000 Units total) by mouth every 7 (seven) days. ?Start taking on: October 17, 2021 ?  ? ?  ? ? Follow-up Information   ? ? [facility]   ?Why: They will follow up with you for your home health needs: Nursing. Start of care next week. ? ?  ?  ? ? Glynda Soliday, DO. Go on 10/18/2021.   ?Specialty: Surgery ?Why: 11:00 a.m. followup perforated ulcer, please bring insurance card, list of medications. ?Contact information: ?[address] [phone]?[phone]? ? ?  ?  ? ?  ?  ? ?  ? ? ? ?Total time spent arranging discharge was >339m. ?Signed: ?IsBenjamine Sprague4/13/2023, 12:10 PM ? ?

## 2021-10-13 DIAGNOSIS — K275 Chronic or unspecified peptic ulcer, site unspecified, with perforation: Secondary | ICD-10-CM | POA: Diagnosis not present

## 2021-10-13 DIAGNOSIS — E440 Moderate protein-calorie malnutrition: Secondary | ICD-10-CM | POA: Diagnosis not present

## 2021-10-13 DIAGNOSIS — D62 Acute posthemorrhagic anemia: Secondary | ICD-10-CM | POA: Diagnosis not present

## 2021-10-13 DIAGNOSIS — Z931 Gastrostomy status: Secondary | ICD-10-CM | POA: Diagnosis not present

## 2021-10-14 ENCOUNTER — Other Ambulatory Visit: Payer: Self-pay | Admitting: Surgery

## 2021-10-14 DIAGNOSIS — R1114 Bilious vomiting: Secondary | ICD-10-CM

## 2021-10-14 MED ORDER — ONDANSETRON HCL 4 MG PO TABS: 4.0000 mg | ORAL_TABLET | Freq: Three times a day (TID) | ORAL | 0 refills | Status: DC | PRN

## 2021-10-17 ENCOUNTER — Other Ambulatory Visit: Payer: Self-pay | Admitting: Surgery

## 2021-10-17 DIAGNOSIS — K251 Acute gastric ulcer with perforation: Secondary | ICD-10-CM

## 2021-10-25 ENCOUNTER — Ambulatory Visit
Admission: RE | Admit: 2021-10-25 | Discharge: 2021-10-25 | Disposition: A | Discharge status: Home / Self Care | Payer: BC Managed Care – PPO | Source: Ambulatory Visit | Attending: Surgery | Admitting: Surgery

## 2021-10-25 DIAGNOSIS — K251 Acute gastric ulcer with perforation: Secondary | ICD-10-CM | POA: Insufficient documentation

## 2021-10-25 DIAGNOSIS — K2980 Duodenitis without bleeding: Secondary | ICD-10-CM | POA: Diagnosis not present

## 2021-10-25 MED ORDER — IOHEXOL 300 MG/ML  SOLN
150.0000 mL | Freq: Once | INTRAMUSCULAR | Status: AC | PRN
  Administered 2021-10-25: 50 mL via ORAL

## 2021-11-02 ENCOUNTER — Encounter: Payer: Self-pay | Admitting: Primary Care

## 2021-11-02 ENCOUNTER — Ambulatory Visit (INDEPENDENT_AMBULATORY_CARE_PROVIDER_SITE_OTHER): Payer: BC Managed Care – PPO | Admitting: Primary Care

## 2021-11-02 VITALS — BP 130/72 | HR 81 | Ht 62.0 in | Wt 119.0 lb

## 2021-11-02 DIAGNOSIS — K275 Chronic or unspecified peptic ulcer, site unspecified, with perforation: Secondary | ICD-10-CM | POA: Diagnosis not present

## 2021-11-02 DIAGNOSIS — D649 Anemia, unspecified: Secondary | ICD-10-CM

## 2021-11-02 DIAGNOSIS — D7589 Other specified diseases of blood and blood-forming organs: Secondary | ICD-10-CM | POA: Diagnosis not present

## 2021-11-02 DIAGNOSIS — D62 Acute posthemorrhagic anemia: Secondary | ICD-10-CM

## 2021-11-02 DIAGNOSIS — E559 Vitamin D deficiency, unspecified: Secondary | ICD-10-CM

## 2021-11-02 HISTORY — DX: Acute posthemorrhagic anemia: D62

## 2021-11-02 NOTE — Assessment & Plan Note (Signed)
Improved per labs from care everywhere through [place] in April 2023. ? ?Repeat CBC pending. ?

## 2021-11-02 NOTE — Assessment & Plan Note (Signed)
Recent hospitalization with two surgical repairs and interventional radiology evaluation. ? ?Today she appears to be doing very well.  She has been released from her Psychologist, sport and exercise.  Exam today stable. ? ?She will pick up antibiotics today as prescribed. ?Start omeprazole 20 mg daily. ? ?Repeat CBC and BMP once she completes antibiotics.  Need to closely follow her anemia. ? ?Hospital notes, labs, imaging reviewed. ?

## 2021-11-02 NOTE — Patient Instructions (Signed)
Schedule a lab appointment for once you complete your antibiotics to repeat blood counts and kidneys.  ? ?It was a pleasure to see you today! ? ? ?

## 2021-11-02 NOTE — Assessment & Plan Note (Signed)
Not currently managed on B12. ? ?Repeat CBC and B12 when she has completed antibiotic regimen. ?

## 2021-11-02 NOTE — Assessment & Plan Note (Signed)
Currently not taking vitamin D supplementation. ? ?Repeat vitamin D once antibiotics have been complete. ?

## 2021-11-02 NOTE — Progress Notes (Signed)
? ?Subjective:  ? ? Patient ID: Maria [name], female    DOB: 16-Mar-1971, 51 y.o.   MRN: [id number] ? ?HPI ? ?Maria [name] is a very pleasant 51 y.o. female with a history of hypertension, chronic pancreatitis, perforated peptic ulcer, AKI, thrombocytopenia, malnutrition, GERD, anemia who presents today for hospital follow-up.  Her mother joins [place] today. ? ?She recently presented to our office with me on 09/13/2021 for ED follow-up for symptoms of abdominal cramping, nausea vomiting, diarrhea.  During this visit she endorsed continued fatigue and did not appear well.  Lab results revealed leukocytosis, electrolyte imbalance, AKI so she was advised to return to the hospital for further evaluation. ? ?She was admitted to [facility] hospital from 09/13/2021 through 10/11/2021 for perforated gastric ulcer.  She underwent 2 operations for perforated gastric ulcer which was complicated by a leak. She now has a jejunostomy tube. She also received 4 units of packed red blood cells, TPN feeding through jejunostomy. She has a gastronomy tube in place for decompression. She was discharged home with home health for nocturnal tube feeds and monitoring of JP drain. Her jejunostomy tube was removed prior to discharge.  ? ?Since her discharge home she's doing well. Her mother has been staying with her over the last two months. Her husband is a paramedic. Her feeding tube came out on April 23rd after using the bathroom, no complications, was able to transition to oral food without difficulty.  ? ?She was evaluated by her surgeon on May 2nd who removed the JP drain and the gastronomy tube. She continues to do well with her diet, is trying to hydrate well, and is passing bowel movements without difficulty. She was prescribed bismuth-metronidazole-tetracycline per her surgeon to take 3 capsules four times daily for 10 days. She has also been instructed to take omeprazole 20 mg daily. She will pick up both medications today.  ? ?She underwent  upper GI on April 26th which revealed healing. She underwent CBC which showed hemoglobin of 9.8 which was improved. She cancelled home health nursing as she has plenty of support at home. She continues to smoke, about 1 PPD, she is contemplating using nicotine patches.  ? ?She will return to work part time next week and feels good about this. Her supervisor is supportive and understands that she may need to rest while at work.  She does continue to have mild abdominal tenderness from her surgery which has overall improved. ? ?Review of Systems  ?Constitutional:  Negative for fatigue and fever.  ?Respiratory:  Negative for shortness of breath.   ?Cardiovascular:  Negative for chest pain.  ?Gastrointestinal:  Negative for abdominal pain, blood in stool, constipation, diarrhea, nausea and vomiting.  ? ?   ? ? ?Past Medical History:  ?Diagnosis Date  ? Anemia   ? Anesthesia complication, initial encounter 09/13/2021  ? Difficulty waking up from Anesthesia requiring re-intubation  ? Anxiety   ? h/o  ? Depression   ? h/o  ? Family history of adverse reaction to anesthesia   ? mom-hard time waking up  ? Folate deficiency 07/30/2019  ? Gastritis 08/19/2020  ? GERD (gastroesophageal reflux disease)   ? tums prn  ? Hypertension   ? WAS PUT ON BP MED BY PCP LAST YEAR (2018) AND BP MED MADE PT SICK SO SHE STOPPED TAKING IT-PCP MONITORS BP NOW  ? Palpitations 05/18/2020  ? Pancreatitis   ? Poison ivy dermatitis 11/13/2019  ? Vitamin B12 deficiency 01/17/2018  ? Intrinsic factor Ab  negative 12/2017  ? ? ?Social History  ? ?Socioeconomic History  ? Marital status: Married  ?  Spouse name: Not on file  ? Number of children: Not on file  ? Years of education: Not on file  ? Highest education level: Not on file  ?Occupational History  ? Not on file  ?Tobacco Use  ? Smoking status: Every Day  ?  Packs/day: 1.00  ?  Years: 20.00  ?  Pack years: 20.00  ?  Types: Cigarettes  ? Smokeless tobacco: Never  ?Vaping Use  ? Vaping Use: Never used   ?Substance and Sexual Activity  ? Alcohol use: Yes  ?  Alcohol/week: 2.0 standard drinks  ?  Types: 2 Glasses of wine per week  ?  Comment: two glasses wine/beer every other day  ? Drug use: No  ? Sexual activity: Yes  ?Other Topics Concern  ? Not on file  ?Social History Narrative  ? Married.  ? 1 child.   ? Working at [employer] at [employer].    ? Enjoys swimming, camping  ? ?Social Determinants of Health  ? ?Financial Resource Strain: Not on file  ?Food Insecurity: Not on file  ?Transportation Needs: Not on file  ?Physical Activity: Not on file  ?Stress: Not on file  ?Social Connections: Not on file  ?Intimate Partner Violence: Not on file  ? ? ?Past Surgical History:  ?Procedure Laterality Date  ? ANTERIOR AND POSTERIOR REPAIR WITH SACROSPINOUS FIXATION N/A 03/13/2018  ? Procedure: ANTERIOR REPAIR;  Surgeon: [name], MD;  Location: [facility] ORS;  Service: Gynecology;  Laterality: N/A;  ? INDUCED ABORTION    ? x2  ? IR CM INJ ANY COLONIC TUBE W/FLUORO  09/29/2021  ? LAPAROTOMY N/A 09/13/2021  ? Procedure: EXPLORATORY LAPAROTOMY-[place] PATCH repair;  Surgeon: [name], DO;  Location: [facility] ORS;  Service: General;  Laterality: N/A;  ? LAPAROTOMY N/A 09/17/2021  ? Procedure: EXPLORATORY LAPAROTOMY;  Surgeon: [name], MD;  Location: [facility] ORS;  Service: General;  Laterality: N/A;  ? REPAIR OF PERFORATED ULCER N/A 09/17/2021  ? Procedure: REPAIR OF PERFORATED ULCER;  Surgeon: [name], MD;  Location: [facility] ORS;  Service: General;  Laterality: N/A;  ? VAGINAL HYSTERECTOMY N/A 03/13/2018  ? Procedure: HYSTERECTOMY VAGINAL;  Surgeon: [name], MD;  Location: [facility] ORS;  Service: Gynecology;  Laterality: N/A;  ? ? ?Family History  ?Problem Relation Age of Onset  ? Arthritis Mother   ? Arthritis Father   ? Alcohol abuse Father   ? Hypertension Father   ? Heart disease Father   ? Aneurysm Father   ? Breast cancer Maternal Grandmother   ? Breast cancer Paternal Grandmother   ? ? ?Allergies  ?Allergen Reactions  ?  Anesthesia S-I-40 [Propofol]   ? Bee Venom Swelling  ? Codeine Swelling  ? Bupropion Anxiety and Other (See Comments)  ?  Worsened anxiety  ? ? ?Current Outpatient Medications on File Prior to Visit  ?Medication Sig Dispense Refill  ? EPINEPHrine 0.3 mg/0.3 mL IJ SOAJ injection Inject 0.3 mLs (0.3 mg total) into the muscle as needed for anaphylaxis. 1 Device 0  ? omeprazole (PRILOSEC) 20 MG capsule Take 1 capsule (20 mg total) by mouth daily. 30 capsule 0  ? Vitamin D, Ergocalciferol, (DRISDOL) 1.25 MG (50000 UNIT) CAPS capsule Take 1 capsule (50,000 Units total) by mouth every 7 (seven) days. 8 capsule 0  ? butalbital-acetaminophen-caffeine (FIORICET) 50-325-40 MG tablet Take 1 tablet by mouth every 6 (six) hours  as needed for headache. (Patient not taking: Reported on 11/02/2021) 14 tablet 0  ? dicyclomine (BENTYL) 10 MG capsule Take 1 capsule (10 mg total) by mouth 4 (four) times daily -  before meals and at bedtime for 3 days. 12 capsule 0  ? ondansetron (ZOFRAN) 4 MG tablet Take 1 tablet (4 mg total) by mouth every 8 (eight) hours as needed for nausea or vomiting. (Patient not taking: Reported on 11/02/2021) 20 tablet 0  ? pantoprazole (PROTONIX) 20 MG tablet Take 1 tablet by mouth 2 (two) times daily. (Patient not taking: Reported on 11/02/2021)    ? sucralfate (CARAFATE) 1 GM/10ML suspension Take 10 mLs (1 g total) by mouth 4 (four) times daily -  with meals and at bedtime. (Patient not taking: Reported on 11/02/2021) 420 mL 0  ? ?No current facility-administered medications on file prior to visit.  ? ? ?BP 130/72   Pulse 81   Ht '5\' 2"'$  (1.575 m)   Wt 119 lb (54 kg)   LMP 02/24/2018 (Approximate) Comment: spotting   SpO2 99%   BMI 21.77 kg/m?  ?Objective:  ? Physical Exam ?Cardiovascular:  ?   Rate and Rhythm: Normal rate and regular rhythm.  ?Pulmonary:  ?   Effort: Pulmonary effort is normal.  ?   Breath sounds: Normal breath sounds.  ?Abdominal:  ?   General: Bowel sounds are normal.  ?   Palpations: Abdomen  is soft.  ?   Comments: Mild generalized tenderness to abdomen.  ?Musculoskeletal:  ?   Cervical back: Neck supple.  ?Skin: ?   General: Skin is warm and dry.  ?   Comments: Bandages intact to abdomen. No erythem

## 2021-11-03 ENCOUNTER — Telehealth: Payer: Self-pay | Admitting: Primary Care

## 2021-11-03 NOTE — Telephone Encounter (Signed)
Pt called because she had an appt yesterday and she is trying to return to work for 3 days a week like her and Alma Friendly discussed and that her work said she needs a note with that info from provider in order to return to work. Please advise. Call back is [phone].  ?

## 2021-11-03 NOTE — Telephone Encounter (Signed)
Work note printed and placed in [employer] ?

## 2021-11-03 NOTE — Telephone Encounter (Signed)
Called and spoke with patient she said that she just needs a note stating that she can only work 3 days week to get her stated. She said it didn't have said any about restrictions. ?

## 2021-11-06 NOTE — Telephone Encounter (Signed)
I got from [place] and placed in folder up front for pickup and pt said she wiill come today to pickup ?

## 2021-11-07 NOTE — Telephone Encounter (Signed)
Pt is requesting a call back from nurse, her work gave her a form she needs filled out. Callback is [phone] ?

## 2021-11-08 ENCOUNTER — Telehealth: Payer: Self-pay | Admitting: Primary Care

## 2021-11-08 NOTE — Telephone Encounter (Signed)
Pt called again and wanted an update on the revised letter for her to return back to work. She stated "I am ready to go back to work, I work 4/5 days a week between 10 am to 6 pm, times vary so it depends. ? ?Callback Number: [id number] ?

## 2021-11-08 NOTE — Telephone Encounter (Signed)
Left message to return call to our office.  

## 2021-11-08 NOTE — Telephone Encounter (Signed)
Pt called and stated she needs a revised letter from [name] to return back to work full time.  ? ?Callback Number: [id number] ?

## 2021-11-08 NOTE — Telephone Encounter (Signed)
Duplicate message. Added below to open message for documentation.  ?

## 2021-11-08 NOTE — Telephone Encounter (Signed)
Does she feel ready to return to work full-time? ? ?Also, what are her full-time days and hours? For example, does she work M-F, 8-5? ?

## 2021-11-08 NOTE — Telephone Encounter (Signed)
Below added from duplicate message that was started.  ? ? ? ?

## 2021-11-08 NOTE — Telephone Encounter (Signed)
Work note updated to reflect patient's request.  Unfortunately I cannot print the letter.  ? ?Joellen, can you print letter and advise patient that letter is ready for pickup? ? ? ?

## 2021-11-09 ENCOUNTER — Telehealth: Payer: Self-pay | Admitting: Primary Care

## 2021-11-09 NOTE — Telephone Encounter (Signed)
Called patient let know I am printing and leaving at reception for her to pick up.  ?

## 2021-11-09 NOTE — Telephone Encounter (Signed)
Pt dropped off paperwork ? ? ?Type of forms received:return to work ? ?Routed [id number] ?joellen ?Paperwork received by : terrill ? ? ?Individual made aware of 3-5 business day turn around (Y/N):y ? ?Form completed and patient made aware of charges(Y/N):y ? ? ?Faxed to :  ? ?Form location:  kate mailbox ?

## 2021-11-10 DIAGNOSIS — K275 Chronic or unspecified peptic ulcer, site unspecified, with perforation: Secondary | ICD-10-CM | POA: Diagnosis not present

## 2021-11-10 DIAGNOSIS — Z931 Gastrostomy status: Secondary | ICD-10-CM | POA: Diagnosis not present

## 2021-11-10 DIAGNOSIS — D62 Acute posthemorrhagic anemia: Secondary | ICD-10-CM | POA: Diagnosis not present

## 2021-11-10 DIAGNOSIS — E440 Moderate protein-calorie malnutrition: Secondary | ICD-10-CM | POA: Diagnosis not present

## 2021-11-13 ENCOUNTER — Other Ambulatory Visit: Payer: BC Managed Care – PPO

## 2021-11-13 NOTE — Telephone Encounter (Signed)
Patient called wanted to follow up on ppw. Advised that we will call as soon as done. She will come by office and pick up when done.  ?

## 2021-11-14 NOTE — Telephone Encounter (Signed)
Form completed called patient to let know copy at reception for pick up and one sent to scan in chart. Patient did not answer left message on voice mail with information. No further action needed.  ?

## 2021-11-15 ENCOUNTER — Encounter: Payer: Self-pay | Admitting: Primary Care

## 2021-11-15 ENCOUNTER — Other Ambulatory Visit (INDEPENDENT_AMBULATORY_CARE_PROVIDER_SITE_OTHER): Payer: BC Managed Care – PPO

## 2021-11-15 ENCOUNTER — Ambulatory Visit (INDEPENDENT_AMBULATORY_CARE_PROVIDER_SITE_OTHER): Payer: BC Managed Care – PPO | Admitting: Primary Care

## 2021-11-15 DIAGNOSIS — D7589 Other specified diseases of blood and blood-forming organs: Secondary | ICD-10-CM

## 2021-11-15 DIAGNOSIS — M25471 Effusion, right ankle: Secondary | ICD-10-CM | POA: Diagnosis not present

## 2021-11-15 DIAGNOSIS — M25472 Effusion, left ankle: Secondary | ICD-10-CM | POA: Insufficient documentation

## 2021-11-15 DIAGNOSIS — E559 Vitamin D deficiency, unspecified: Secondary | ICD-10-CM | POA: Diagnosis not present

## 2021-11-15 DIAGNOSIS — D649 Anemia, unspecified: Secondary | ICD-10-CM | POA: Diagnosis not present

## 2021-11-15 HISTORY — DX: Effusion, left ankle: M25.472

## 2021-11-15 LAB — BASIC METABOLIC PANEL
BUN: 9 mg/dL (ref 6–23)
CO2: 27 mEq/L (ref 19–32)
Calcium: 10 mg/dL (ref 8.4–10.5)
Chloride: 105 mEq/L (ref 96–112)
Creatinine, Ser: 0.71 mg/dL (ref 0.40–1.20)
GFR: 98.62 mL/min (ref >60.00)
Glucose, Bld: 121 mg/dL — ABNORMAL HIGH (ref 70–99)
Potassium: 4.4 mEq/L (ref 3.5–5.1)
Sodium: 139 mEq/L (ref 135–145)

## 2021-11-15 LAB — VITAMIN B12: Vitamin B-12: 482 pg/mL (ref 211–911)

## 2021-11-15 LAB — CBC
HCT: 34 % — ABNORMAL LOW (ref 36.0–46.0)
Hemoglobin: 11.3 g/dL — ABNORMAL LOW (ref 12.0–15.0)
MCHC: 33.3 g/dL (ref 30.0–36.0)
MCV: 98.8 fl (ref 78.0–100.0)
Platelets: 123 10*3/uL — ABNORMAL LOW (ref 150.0–400.0)
RBC: 3.44 Mil/uL — ABNORMAL LOW (ref 3.87–5.11)
RDW: 15.2 % (ref 11.5–15.5)
WBC: 4.2 10*3/uL (ref 4.0–10.5)

## 2021-11-15 LAB — VITAMIN D 25 HYDROXY (VIT D DEFICIENCY, FRACTURES): VITD: 36.84 ng/mL (ref 30.00–100.00)

## 2021-11-15 NOTE — Assessment & Plan Note (Signed)
Mildly noted on exam today. ? ?Suspect this is secondary to increased physical activity as she has been sedentary for nearly 6 weeks. ?Lower suspicion for DVT, especially as symptoms resolve in the morning when waking.  She is not managed on calcium channel blocker. ? ?Recommended elevation of legs when resting, compression socks daily. ? ?Return precautions provided. ?

## 2021-11-15 NOTE — Progress Notes (Signed)
? ?Subjective:  ? ? Patient ID: Maria [name], female    DOB: [birth year], 51 y.o.   MRN: [id number] ? ?HPI ? ?Maria [name] is a very pleasant 51 y.o. female with a history of hypertension, chronic pancreatitis, perforated peptic ulcer, AKI, thrombocytopenia, chronic anemia, depression who presents today to discuss pedal edema. ? ?She is recently status post 2 operations for perforated gastric ulcer with complications. Released by surgeon recently.  No longer with jejunostomy tube or gastrostomy tube. ? ?She noticed bilateral ankle edema over the last 3-4 days, resolved in the morning, worse throughout the day.  She has increased her activity level over the last few weeks as she is preparing to return to work. Her swelling is isolated to ankles only. Denies lower extremity edema, pedal edema, color changes, calf pain. ? ?She is hydrating well with water.  She has noticed some achiness to her toes.  She was provided a medication during her hospital stay to help with his symptoms.  She is unsure of the name, but is requesting a refill. ? ? ?Review of Systems  ?Respiratory:  Negative for shortness of breath.   ?Cardiovascular:  Negative for leg swelling.  ?     Ankle edema  ?Gastrointestinal:  Negative for abdominal pain.  ?Musculoskeletal:  Positive for arthralgias.  ? ?   ? ? ?Past Medical History:  ?Diagnosis Date  ? Acute postoperative anemia due to expected blood loss 09/19/2021  ? Anemia   ? Anesthesia complication, initial encounter 09/13/2021  ? Difficulty waking up from Anesthesia requiring re-intubation  ? Anxiety   ? h/o  ? Depression   ? h/o  ? Family history of adverse reaction to anesthesia   ? mom-hard time waking up  ? Folate deficiency 07/30/2019  ? Gastritis 08/19/2020  ? GERD (gastroesophageal reflux disease)   ? tums prn  ? Hypertension   ? WAS PUT ON BP MED BY PCP LAST YEAR (2018) AND BP MED MADE PT SICK SO SHE STOPPED TAKING IT-PCP MONITORS BP NOW  ? Palpitations 05/18/2020  ? Pancreatitis   ? Poison  ivy dermatitis 11/13/2019  ? Vitamin B12 deficiency 01/17/2018  ? 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  ? ? ?Social History  ? ?Socioeconomic History  ? Marital status: Married  ?  Spouse name: Not on file  ? Number of children: Not on file  ? Years of education: Not on file  ? Highest education level: Not on file  ?Occupational History  ? Not on file  ?Tobacco Use  ? Smoking status: Every Day  ?  Packs/day: 1.00  ?  Years: 20.00  ?  Pack years: 20.00  ?  Types: Cigarettes  ? Smokeless tobacco: Never  ?Vaping Use  ? Vaping Use: Never used  ?Substance and Sexual Activity  ? Alcohol use: Yes  ?  Alcohol/week: 2.0 standard drinks  ?  Types: 2 Glasses of wine per week  ?  Comment: two glasses wine/beer every other day  ? Drug use: No  ? Sexual activity: Yes  ?Other Topics Concern  ? Not on file  ?Social History Narrative  ? Married.  ? 1 child.   ? Working at [employer] at [employer].    ? Enjoys swimming, camping  ? ?Social Determinants of Health  ? ?Financial Resource Strain: Not on file  ?Food Insecurity: Not on file  ?Transportation Needs: Not on file  ?Physical Activity: Not on file  ?Stress: Not on file  ?Social Connections: Not on file  ?Intimate  Partner Violence: Not on file  ? ? ?Past Surgical History:  ?Procedure Laterality Date  ? ANTERIOR AND POSTERIOR REPAIR WITH SACROSPINOUS FIXATION N/A 03/13/2018  ? Procedure: ANTERIOR REPAIR;  Surgeon: [name], MD;  Location: [facility] ORS;  Service: Gynecology;  Laterality: N/A;  ? INDUCED ABORTION    ? x2  ? IR CM INJ ANY COLONIC TUBE W/FLUORO  09/29/2021  ? LAPAROTOMY N/A 09/13/2021  ? Procedure: EXPLORATORY LAPAROTOMY-Graham PATCH repair;  Surgeon: [name], DO;  Location: [facility];  Service: General;  Laterality: N/A;  ? LAPAROTOMY N/A 09/17/2021  ? Procedure: EXPLORATORY LAPAROTOMY;  Surgeon: [name], MD;  Location: [facility];  Service: General;  Laterality: N/A;  ? REPAIR OF PERFORATED ULCER N/A 09/17/2021  ? Procedure: REPAIR OF PERFORATED ULCER;  Surgeon: [name], MD;  Location: [facility];  Service: General;  Laterality: N/A;  ? VAGINAL HYSTERECTOMY N/A 03/13/2018  ? Procedure: HYSTERECTOMY VAGINAL;  Surgeon: [name], MD;  Location: [facility];  Service: Gynecology;  Laterality: N/A;  ? ? ?Family History  ?Problem Relation Age of Onset  ? Arthritis Mother   ? Arthritis Father   ? Alcohol abuse Father   ? Hypertension Father   ? Heart disease Father   ? Aneurysm Father   ? Breast cancer Maternal Grandmother   ? Breast cancer Paternal Grandmother   ? ? ?Allergies  ?Allergen Reactions  ? Anesthesia S-I-40 [Propofol]   ? Bee Venom Swelling  ? Codeine Swelling  ? Bupropion Anxiety and Other (See Comments)  ?  Worsened anxiety  ? ? ?Current Outpatient Medications on File Prior to Visit  ?Medication Sig Dispense Refill  ? EPINEPHrine 0.3 mg/0.3 mL IJ SOAJ injection Inject 0.3 mLs (0.3 mg total) into the muscle as needed for anaphylaxis. 1 Device 0  ? omeprazole (PRILOSEC) 20 MG capsule Take 1 capsule (20 mg total) by mouth daily. 30 capsule 0  ? butalbital-acetaminophen-caffeine (FIORICET) 50-325-40 MG tablet Take 1 tablet by mouth every 6 (six) hours as needed for headache. (Patient not taking: Reported on 11/02/2021) 14 tablet 0  ? Vitamin D, Ergocalciferol, (DRISDOL) 1.25 MG (50000 UNIT) CAPS capsule Take 1 capsule (50,000 Units total) by mouth every 7 (seven) days. (Patient not taking: Reported on 11/15/2021) 8 capsule 0  ? ?No current facility-administered medications on file prior to visit.  ? ? ?BP 132/64   Pulse (!) 58   Temp 98.6 ?F (37 ?C) (Oral)   Ht '5\' 2"'$  (1.575 m)   Wt 121 lb (54.9 kg)   LMP 02/24/2018 (Approximate) Comment: spotting   SpO2 100%   BMI 22.13 kg/m?  ?Objective:  ? Physical Exam ?Cardiovascular:  ?   Rate and Rhythm: Normal rate and regular rhythm.  ?   Pulses:     ?     Dorsalis pedis pulses are 2+ on the right side and 2+ on the left side.  ?     Posterior tibial pulses are 2+ on the right side and 2+ on the left side.  ?   Comments: Mild  bilateral ankle edema, left worse than right. ?No edema noted to legs or feet. ? ?Pulmonary:  ?   Effort: Pulmonary effort is normal.  ?Musculoskeletal:  ?   Cervical back: Neck supple.  ?Skin: ?   General: Skin is warm and dry.  ? ? ? ? ? ?   ?Assessment & Plan:  ? ? ? ? ?This visit occurred during the SARS-CoV-2 public health emergency.  Safety  protocols were in place, including screening questions prior to the visit, additional usage of staff PPE, and extensive cleaning of exam room while observing appropriate contact time as indicated for disinfecting solutions.  ?

## 2021-11-15 NOTE — Patient Instructions (Signed)
Elevate your legs when sitting. ? ?Gradually increase your activity level. ? ?Consider compression socks when at work. ? ?Please notify me if you develop swelling to your legs, pain to your calves. ? ?It was a pleasure to see you today! ? ? ?

## 2021-11-16 ENCOUNTER — Other Ambulatory Visit: Payer: Self-pay

## 2021-11-16 NOTE — Telephone Encounter (Signed)
Called patient to give lab notes. States she called office about refill of pended medication. Did not see open refill request. Patient informed that if any questions we will give her a call.

## 2021-11-17 NOTE — Telephone Encounter (Signed)
This medication is used short term for headaches, was a hospital medication, and is not refillable.    If she's experiencing frequent, daily headaches then we should meet to discuss. She didn't mention recurrent headaches during our most recent visit.

## 2021-12-11 DIAGNOSIS — D62 Acute posthemorrhagic anemia: Secondary | ICD-10-CM | POA: Diagnosis not present

## 2021-12-11 DIAGNOSIS — E440 Moderate protein-calorie malnutrition: Secondary | ICD-10-CM | POA: Diagnosis not present

## 2021-12-11 DIAGNOSIS — K275 Chronic or unspecified peptic ulcer, site unspecified, with perforation: Secondary | ICD-10-CM | POA: Diagnosis not present

## 2021-12-11 DIAGNOSIS — Z931 Gastrostomy status: Secondary | ICD-10-CM | POA: Diagnosis not present

## 2021-12-14 ENCOUNTER — Encounter: Payer: Self-pay | Admitting: Primary Care

## 2021-12-14 ENCOUNTER — Ambulatory Visit (INDEPENDENT_AMBULATORY_CARE_PROVIDER_SITE_OTHER): Payer: BC Managed Care – PPO | Admitting: Family Medicine

## 2021-12-14 ENCOUNTER — Encounter: Payer: Self-pay | Admitting: Family Medicine

## 2021-12-14 VITALS — BP 120/60 | HR 78 | Temp 98.9°F | Ht 62.0 in | Wt 118.4 lb

## 2021-12-14 DIAGNOSIS — G5601 Carpal tunnel syndrome, right upper limb: Secondary | ICD-10-CM | POA: Diagnosis not present

## 2021-12-14 DIAGNOSIS — M65331 Trigger finger, right middle finger: Secondary | ICD-10-CM

## 2021-12-14 MED ORDER — TRIAMCINOLONE ACETONIDE 40 MG/ML IJ SUSP
20.0000 mg | Freq: Once | INTRAMUSCULAR | Status: AC
  Administered 2021-12-14: 20 mg via INTRA_ARTICULAR

## 2021-12-14 NOTE — Addendum Note (Signed)
Addended by: Carter Kitten on: 12/14/2021 11:05 AM   Modules accepted: Orders

## 2021-12-14 NOTE — Progress Notes (Signed)
Emiah Pellicano T. Nefi Musich, MD, [place] at [facility] [place], [address]  Phone: [phone]  FAX: [phone]  Maria [name] - 51 y.o. female  MRN [id number]  Date of Birth: 02-03-1971  Date: 12/14/2021  PCP: [name], NP  Referral: [name], NP  Chief Complaint  Patient presents with   Trigger Finger    Right Middle Finger   Numbness    Right hand to wrist    Subjective:   Maria [name] is a 51 y.o. very pleasant female patient with Body mass index is 21.65 kg/m. who presents with the following:  R 3rd digit trigger finger and R CTS injection 01/2021.  She is a very pleasant lady, and she has had some persistent symptoms.  After injected her carpal tunnel as well as her trigger finger she had good relief of symptoms for roughly 3 months.  Right now she is bothered quite a bit by the numbness and tingling in her distal forearm as well as around the wrist.  She does wear her night splint sometimes, she is not really sure if this helps.  She also is having some significant triggering on the third digit on the right.  At times this will bother her a lot, and other times it will not bother her quite as much.  Inj trigger  Review of Systems is noted in the HPI, as appropriate  Objective:   BP 120/60   Pulse 78   Temp 98.9 F (37.2 C) (Oral)   Ht '5\' 2"'$  (1.575 m)   Wt 118 lb 6 oz (53.7 kg)   LMP 02/24/2018 (Approximate) Comment: spotting   SpO2 98%   BMI 21.65 kg/m   GEN: No acute distress; alert,appropriate. PULM: Breathing comfortably in no respiratory distress PSYCH: Normally interactive.   Right third digit with obvious palpable nodule and triggering with motion.  She has no significant flexor tendon tenderness. She has no significant basal joint tenderness.  She does have an inducible Phalen's and Tinel's on the right side.  Grossly, sensation is normal throughout the  hand.  Laboratory and Imaging Data:  Assessment and Plan:     ICD-10-CM   1. Trigger middle finger of right hand  M65.331     2. Right carpal tunnel syndrome  G56.01      Symptomatic trigger, we reviewed again.  W CTS, certainly at any point EMG/NCV would be appropriate, but she wants to hold and not consider intervention at this point.  Tendon Sheath Injection Procedure Note Maria [name] [birth year] Date of procedure: 12/14/2021  Procedure: Tendon Sheath Injection for Trigger Finger, R 3rd Indications: Pain  Procedure Details Verbal consent was obtained. Risks (including potential risk for skin lightening and potential atrophy), benefits and alternatives were discussed. Prepped with Chloraprep and Ethyl Chloride used for anesthesia. Under sterile conditions, patient injected at palmar crease aiming distally with 45 degree angle towards nodule; injected directly into tendon sheath. Medication flowed freely without resistance.  Needle size: 22 gauge 1 1/2 inch Injection: 1/2 cc of Lidocaine 1% and Kenalog 20 mg Medication: 1/2 cc of Kenalog 40 mg (equaling Kenalog 20 mg)   Medication Management during today's office visit: No orders of the defined types were placed in this encounter.  There are no discontinued medications.  Orders placed today for conditions managed today: No orders of the defined types were placed in this encounter.   Follow-up if needed: No follow-ups on file.  Dr[employer] One speech-to-text software was used for transcription in this dictation.  Possible transcriptional errors can occur using Editor, commissioning.   Signed,  Maria Deed. Benaiah Behan, MD   Outpatient Encounter Medications as of 12/14/2021  Medication Sig   diazepam (VALIUM) 5 MG tablet Take by mouth.   EPINEPHrine 0.3 mg/0.3 mL IJ SOAJ injection Inject 0.3 mLs (0.3 mg total) into the muscle as needed for anaphylaxis.   HYDROcodone-acetaminophen (NORCO/VICODIN) 5-325 MG tablet Take 1 tablet  by mouth every 6 (six) hours as needed.   omeprazole (PRILOSEC) 20 MG capsule Take 1 capsule (20 mg total) by mouth daily.   No facility-administered encounter medications on file as of 12/14/2021.

## 2022-01-03 ENCOUNTER — Ambulatory Visit: Payer: BC Managed Care – PPO | Admitting: Primary Care

## 2022-01-09 ENCOUNTER — Ambulatory Visit (INDEPENDENT_AMBULATORY_CARE_PROVIDER_SITE_OTHER): Payer: BC Managed Care – PPO | Admitting: Nurse Practitioner

## 2022-01-09 ENCOUNTER — Encounter: Payer: Self-pay | Admitting: Nurse Practitioner

## 2022-01-09 VITALS — BP 152/60 | HR 80 | Temp 97.6°F | Resp 12 | Ht 62.0 in | Wt 115.1 lb

## 2022-01-09 DIAGNOSIS — T148XXA Other injury of unspecified body region, initial encounter: Secondary | ICD-10-CM | POA: Diagnosis not present

## 2022-01-09 MED ORDER — CYCLOBENZAPRINE HCL 5 MG PO TABS: 5.0000 mg | ORAL_TABLET | Freq: Two times a day (BID) | ORAL | 0 refills | Status: DC | PRN

## 2022-01-09 NOTE — Assessment & Plan Note (Signed)
Over-the-counter treatment such as Tylenol as directed and over the counter lidocaine patches. Will send in cyclobenzaprine 5 mg BID prn. Sedation precautions given.

## 2022-01-09 NOTE — Progress Notes (Signed)
Acute Office Visit  Subjective:     Patient ID: Maria [name], female    DOB: [birth year], 51 y.o.   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 Noticed back pain on 01/07/22 located around mid shoulder blade going to the left side. Pain described as achy sensation almost constant. Discomfort during the day and when sleeping at night. Has used Icy hot, Ibuprofen and tylenol.    Back Pain Pertinent negatives include no chest pain, fever, tingling or weakness.   Patient is in today for Back pain   States that she thinks it started on Saturday or Sunday. States that she works in [employer] and bends over a lot. States that she is having trouble laying down.  States that sometimes certain movements and position can make it worse but cannot define and discrete movement that does it. Intermittent pain that is described as an aching throb Icy hot and tylenol that may have helped some. Unsure if it helps  Review of Systems  Constitutional:  Negative for chills and fever.  Respiratory:  Negative for shortness of breath.   Cardiovascular:  Negative for chest pain.  Musculoskeletal:  Positive for back pain.  Neurological:  Negative for tingling and weakness.        Objective:    BP (!) 152/60   Pulse 80   Temp 97.6 F (36.4 C)   Resp 12   Ht '5\' 2"'$  (1.575 m)   Wt 115 lb 2 oz (52.2 kg)   LMP 02/24/2018 (Approximate) Comment: spotting   SpO2 99%   BMI 21.06 kg/m    Physical Exam Vitals and nursing note reviewed.  Constitutional:      Appearance: Normal appearance.  Cardiovascular:     Rate and Rhythm: Normal rate and regular rhythm.     Pulses:          Radial pulses are 2+ on the right side and 2+ on the left side.  Pulmonary:     Effort: Pulmonary effort is normal.     Breath sounds: Normal breath sounds.  Musculoskeletal:        General:  Tenderness present. No signs of injury.     Left shoulder: No swelling. Normal range of motion. Normal strength. Normal pulse.       Arms:     Right lower leg: No edema.     Left lower leg: No edema.  Neurological:     General: No focal deficit present.     Mental Status: She is alert.     Deep Tendon Reflexes:     Reflex Scores:      Bicep reflexes are 2+ on the right side and 2+ on the left side.    Comments: Bilateral upper extremity strength 5/5     No results found for any visits on 01/09/22.      Assessment & Plan:   Problem List Items Addressed This Visit       Musculoskeletal and Integument   Muscle strain - Primary    Over-the-counter treatment such as Tylenol as directed and over the counter lidocaine patches. Will send in cyclobenzaprine 5 mg BID prn. Sedation precautions given.       Relevant Medications   cyclobenzaprine (FLEXERIL) 5 MG tablet    Meds ordered this encounter  Medications   cyclobenzaprine (FLEXERIL) 5 MG tablet  Sig: Take 1 tablet (5 mg total) by mouth 2 (two) times daily as needed for muscle spasms.    Dispense:  15 tablet    Refill:  0    Order Specific Question:   Supervising Provider    Answer:   TOWER, MARNE A [[id number]    Return if symptoms worsen or fail to improve.  Romilda Garret, NP

## 2022-01-09 NOTE — Patient Instructions (Signed)
Nice to see you today You can use an over the counter lidocaine patch through the day at work along with tylenol as directed on the bottle.  I sent in a muscle relaxer. It can make you sleepy so use caution  Follow up if no improvement

## 2022-01-10 ENCOUNTER — Encounter (HOSPITAL_COMMUNITY): Payer: Self-pay | Admitting: Emergency Medicine

## 2022-01-10 ENCOUNTER — Telehealth: Payer: Self-pay | Admitting: Nurse Practitioner

## 2022-01-10 ENCOUNTER — Other Ambulatory Visit: Payer: Self-pay

## 2022-01-10 ENCOUNTER — Emergency Department (HOSPITAL_COMMUNITY)
Admission: EM | Admit: 2022-01-10 | Discharge: 2022-01-10 | Discharge status: Against Medical Advice | Payer: BC Managed Care – PPO | Attending: Emergency Medicine | Admitting: Emergency Medicine

## 2022-01-10 DIAGNOSIS — Z5321 Procedure and treatment not carried out due to patient leaving prior to being seen by health care provider: Secondary | ICD-10-CM | POA: Insufficient documentation

## 2022-01-10 DIAGNOSIS — R112 Nausea with vomiting, unspecified: Secondary | ICD-10-CM | POA: Insufficient documentation

## 2022-01-10 DIAGNOSIS — Z931 Gastrostomy status: Secondary | ICD-10-CM | POA: Diagnosis not present

## 2022-01-10 DIAGNOSIS — M546 Pain in thoracic spine: Secondary | ICD-10-CM | POA: Diagnosis not present

## 2022-01-10 DIAGNOSIS — K275 Chronic or unspecified peptic ulcer, site unspecified, with perforation: Secondary | ICD-10-CM | POA: Diagnosis not present

## 2022-01-10 DIAGNOSIS — E440 Moderate protein-calorie malnutrition: Secondary | ICD-10-CM | POA: Diagnosis not present

## 2022-01-10 DIAGNOSIS — M545 Low back pain, unspecified: Secondary | ICD-10-CM | POA: Diagnosis not present

## 2022-01-10 DIAGNOSIS — D62 Acute posthemorrhagic anemia: Secondary | ICD-10-CM | POA: Diagnosis not present

## 2022-01-10 DIAGNOSIS — M25512 Pain in left shoulder: Secondary | ICD-10-CM | POA: Diagnosis not present

## 2022-01-10 LAB — COMPREHENSIVE METABOLIC PANEL
ALT: 15 U/L (ref 0–44)
AST: 21 U/L (ref 15–41)
Albumin: 4 g/dL (ref 3.5–5.0)
Alkaline Phosphatase: 104 U/L (ref 38–126)
Anion gap: 13 (ref 5–15)
BUN: 18 mg/dL (ref 6–20)
CO2: 24 mmol/L (ref 22–32)
Calcium: 9.9 mg/dL (ref 8.9–10.3)
Chloride: 105 mmol/L (ref 98–111)
Creatinine, Ser: 0.75 mg/dL (ref 0.44–1.00)
GFR, Estimated: >60 mL/min (ref >60)
Glucose, Bld: 115 mg/dL — ABNORMAL HIGH (ref 70–99)
Potassium: 3.6 mmol/L (ref 3.5–5.1)
Sodium: 142 mmol/L (ref 135–145)
Total Bilirubin: 1.7 mg/dL — ABNORMAL HIGH (ref 0.3–1.2)
Total Protein: 7 g/dL (ref 6.5–8.1)

## 2022-01-10 LAB — CBC WITH DIFFERENTIAL/PLATELET
Abs Immature Granulocytes: 0.02 10*3/uL (ref 0.00–0.07)
Basophils Absolute: 0 10*3/uL (ref 0.0–0.1)
Basophils Relative: 0 %
Eosinophils Absolute: 0 10*3/uL (ref 0.0–0.5)
Eosinophils Relative: 1 %
HCT: 39.4 % (ref 36.0–46.0)
Hemoglobin: 13.7 g/dL (ref 12.0–15.0)
Immature Granulocytes: 0 %
Lymphocytes Relative: 17 %
Lymphs Abs: 1.2 10*3/uL (ref 0.7–4.0)
MCH: 34.5 pg — ABNORMAL HIGH (ref 26.0–34.0)
MCHC: 34.8 g/dL (ref 30.0–36.0)
MCV: 99.2 fL (ref 80.0–100.0)
Monocytes Absolute: 0.4 10*3/uL (ref 0.1–1.0)
Monocytes Relative: 6 %
Neutro Abs: 5.6 10*3/uL (ref 1.7–7.7)
Neutrophils Relative %: 76 %
Platelets: 85 10*3/uL — ABNORMAL LOW (ref 150–400)
RBC: 3.97 MIL/uL (ref 3.87–5.11)
RDW: 13.2 % (ref 11.5–15.5)
WBC: 7.3 10*3/uL (ref 4.0–10.5)
nRBC: 0 % (ref 0.0–0.2)

## 2022-01-10 LAB — LIPASE, BLOOD: Lipase: 18 U/L (ref 11–51)

## 2022-01-10 MED ORDER — ONDANSETRON 4 MG PO TBDP
4.0000 mg | ORAL_TABLET | Freq: Once | ORAL | Status: AC
Start: 2022-01-10 — End: 2022-01-10
  Administered 2022-01-10: 4 mg via ORAL
  Filled 2022-01-10: qty 1

## 2022-01-10 NOTE — Telephone Encounter (Signed)
Spoke with patient. Patient will be picking up Flexeril today but wanted to go ahead and get set up with Dr Lorelei Pont for follow up on back pain issue. First available appointment made for 01/15/22. Patient was advised to tryt Flexeril as needed and Lidocaine patches in the meantime as recommended by [facility] during her visit and let us know if symptoms get worse and she needs advise before her follow up appointment with Dr Lorelei Pont.

## 2022-01-10 NOTE — ED Provider Triage Note (Signed)
Emergency Medicine Provider Triage Evaluation Note  Maria [name] , a 51 y.o. female  was evaluated in triage.  Pt complains of left shoulder/back pain and nausea/vomiting.  Patient reports that she started having left-sided thoracic back pain on 01/07/2022.  Pain is located around her left scapula.  Patient reports that she does a lot of movements at work lifting, pulling, and turning.  Patient went to her PCP yesterday and was diagnosed with a musculoskeletal strain.  Patient was given Flexeril.  Patient states that she took the Flexeril today and has vomited 3 times since then.  Patient describes emesis as stomach contents.  Patient has been able to keep down any p.o. intake.  Patient reports that 2 months prior she had surgery for gastric ulcers.  Denies any recent falls or injuries.  Review of Systems  Positive: Back pain, nausea, vomiting Negative: Fever, chills, chest pain, shortness of breath, numbness, weakness, saddle anesthesia, bowel/bladder dysfunction, constipation, diarrhea, blood in stool, melena, dysuria, hematuria, urinary urgency  Physical Exam  BP (!) 174/79   Pulse 68   Temp 98.2 F (36.8 C) (Oral)   Resp 15   LMP 02/24/2018 (Approximate) Comment: spotting   SpO2 100%  Gen:   Awake, no distress   Resp:  Normal effort,  MSK:   Moves extremities without difficulty  Other:  Tenderness to left thoracic back around her scapula.  No midline tenderness or deformity to cervical, thoracic, or lumbar spine.  Abdomen soft, nondistended, nontender with no guarding or rebound tenderness.  Medical Decision Making  Medically screening exam initiated at 8:13 PM.  Appropriate orders placed.  Maria [name] was informed that the remainder of the evaluation will be completed by another provider, this initial triage assessment does not replace that evaluation, and the importance of remaining in the ED until their evaluation is complete.     Loni Beckwith, [place] 01/10/22 2016

## 2022-01-10 NOTE — Telephone Encounter (Signed)
Patient called to follow up on yesterdays appt, she said she was prescribed something for sleep but said she is sleeping fine and its just when she wakes up is when she hurts. She wants to know what the next step is. Call back is [phone]

## 2022-01-10 NOTE — ED Notes (Signed)
Patient states they are leaving d/t wait time

## 2022-01-10 NOTE — ED Triage Notes (Signed)
Pt reported to ED with c/o left shoulder pain x3 days. Was given muscle relaxer by PCP yesterday for the same and states she started vomiting today.

## 2022-01-11 ENCOUNTER — Emergency Department: Payer: BC Managed Care – PPO

## 2022-01-11 ENCOUNTER — Other Ambulatory Visit: Payer: Self-pay

## 2022-01-11 ENCOUNTER — Encounter: Payer: Self-pay | Admitting: Emergency Medicine

## 2022-01-11 ENCOUNTER — Emergency Department
Admission: EM | Admit: 2022-01-11 | Discharge: 2022-01-11 | Disposition: A | Discharge status: Home / Self Care | Payer: BC Managed Care – PPO | Attending: Emergency Medicine | Admitting: Emergency Medicine

## 2022-01-11 DIAGNOSIS — R7989 Other specified abnormal findings of blood chemistry: Secondary | ICD-10-CM | POA: Insufficient documentation

## 2022-01-11 DIAGNOSIS — M546 Pain in thoracic spine: Secondary | ICD-10-CM | POA: Insufficient documentation

## 2022-01-11 DIAGNOSIS — R112 Nausea with vomiting, unspecified: Secondary | ICD-10-CM | POA: Insufficient documentation

## 2022-01-11 DIAGNOSIS — R1013 Epigastric pain: Secondary | ICD-10-CM | POA: Diagnosis not present

## 2022-01-11 DIAGNOSIS — R109 Unspecified abdominal pain: Secondary | ICD-10-CM | POA: Diagnosis not present

## 2022-01-11 DIAGNOSIS — R824 Acetonuria: Secondary | ICD-10-CM | POA: Diagnosis not present

## 2022-01-11 DIAGNOSIS — I10 Essential (primary) hypertension: Secondary | ICD-10-CM | POA: Insufficient documentation

## 2022-01-11 LAB — COMPREHENSIVE METABOLIC PANEL
ALT: 15 U/L (ref 0–44)
AST: 22 U/L (ref 15–41)
Albumin: 4.3 g/dL (ref 3.5–5.0)
Alkaline Phosphatase: 105 U/L (ref 38–126)
Anion gap: 13 (ref 5–15)
BUN: 24 mg/dL — ABNORMAL HIGH (ref 6–20)
CO2: 24 mmol/L (ref 22–32)
Calcium: 10 mg/dL (ref 8.9–10.3)
Chloride: 105 mmol/L (ref 98–111)
Creatinine, Ser: 0.69 mg/dL (ref 0.44–1.00)
GFR, Estimated: >60 mL/min (ref >60)
Glucose, Bld: 106 mg/dL — ABNORMAL HIGH (ref 70–99)
Potassium: 4.1 mmol/L (ref 3.5–5.1)
Sodium: 142 mmol/L (ref 135–145)
Total Bilirubin: 2.1 mg/dL — ABNORMAL HIGH (ref 0.3–1.2)
Total Protein: 7.7 g/dL (ref 6.5–8.1)

## 2022-01-11 LAB — CBC
HCT: 39.5 % (ref 36.0–46.0)
Hemoglobin: 13.5 g/dL (ref 12.0–15.0)
MCH: 33.8 pg (ref 26.0–34.0)
MCHC: 34.2 g/dL (ref 30.0–36.0)
MCV: 98.8 fL (ref 80.0–100.0)
Platelets: 94 10*3/uL — ABNORMAL LOW (ref 150–400)
RBC: 4 MIL/uL (ref 3.87–5.11)
RDW: 13.1 % (ref 11.5–15.5)
WBC: 8.9 10*3/uL (ref 4.0–10.5)
nRBC: 0 % (ref 0.0–0.2)

## 2022-01-11 LAB — URINALYSIS, ROUTINE W REFLEX MICROSCOPIC
Bilirubin Urine: NEGATIVE
Glucose, UA: NEGATIVE mg/dL
Ketones, ur: 80 mg/dL — AB
Leukocytes,Ua: NEGATIVE
Nitrite: NEGATIVE
Protein, ur: 30 mg/dL — AB
Specific Gravity, Urine: 1.023 (ref 1.005–1.030)
pH: 5 (ref 5.0–8.0)

## 2022-01-11 LAB — LIPASE, BLOOD: Lipase: 18 U/L (ref 11–51)

## 2022-01-11 MED ORDER — ONDANSETRON HCL 4 MG/2ML IJ SOLN
4.0000 mg | Freq: Once | INTRAMUSCULAR | Status: AC
Start: 2022-01-11 — End: 2022-01-11
  Administered 2022-01-11: 4 mg via INTRAVENOUS
  Filled 2022-01-11: qty 2

## 2022-01-11 MED ORDER — FAMOTIDINE IN NACL 20-0.9 MG/50ML-% IV SOLN
20.0000 mg | Freq: Once | INTRAVENOUS | Status: AC
  Administered 2022-01-11: 20 mg via INTRAVENOUS
  Filled 2022-01-11: qty 50

## 2022-01-11 MED ORDER — SODIUM CHLORIDE 0.9 % IV BOLUS
1000.0000 mL | Freq: Once | INTRAVENOUS | Status: AC
  Administered 2022-01-11: 1000 mL via INTRAVENOUS

## 2022-01-11 MED ORDER — ONDANSETRON HCL 4 MG PO TABS: 4.0000 mg | ORAL_TABLET | Freq: Every day | ORAL | 1 refills | Status: DC | PRN

## 2022-01-11 MED ORDER — PANTOPRAZOLE SODIUM 40 MG PO TBEC: 40.0000 mg | DELAYED_RELEASE_TABLET | Freq: Every day | ORAL | 0 refills | Status: DC

## 2022-01-11 NOTE — ED Triage Notes (Signed)
Pt via POV from home. Pt c/o emesis and epigastric pain after starting on cyclobenzaprine, states that she stopped taking them but still having these symptoms. Pt states she was recently had surgery in March for ulcers per patient.   States that they were seen at [facility] ED last night but LWBS due to the wait. Pt is A&Ox4 and NAD

## 2022-01-11 NOTE — ED Notes (Signed)
First Nurse Note: Pt to ED via POV for nausea and vomiting. Pt is in NAD.

## 2022-01-11 NOTE — ED Notes (Signed)
See triage note  Presents with some n/v  States she developed these sx's after taking Flexeril    States she has not taken any more but sx's cont.  Afebrile on arrival

## 2022-01-11 NOTE — Discharge Instructions (Signed)
Please start taking the Protonix daily for the stomach inflammation.  You can take the Zofran as needed for nausea and vomiting.  If your symptoms are worsening or you are unable to keep liquids down please return to the emergency department.  Please avoid all NSAIDs including ibuprofen Motrin or naproxen as this is very damaging to the stomach lining please limit alcohol as much as possible.  Please also follow-up with gastroenterology as you may need to have an endoscopy.

## 2022-01-11 NOTE — ED Provider Notes (Signed)
History of  [facility] Provider Note    Event Date/Time   First MD Initiated Contact with Patient 01/11/22 1214     (approximate)   History   Emesis   HPI  Maria [name] is a 51 y.o. female past medical history of anemia of chronic disease, perforated gastric ulcer, GERD, gastritis presenting with nausea and vomiting.  Patient has had some left upper back pain for about a week.  She was prescribed Flexeril for this and this seemed to exacerbate her epigastric discomfort and nausea vomiting.  She has a history of similar and history of gastritis and perforated ulcer.  Had been doing okay until medication was started now is really not able to tolerate p.o. that she vomits really pretty much immediately after having any oral intake.  Has some mild abdominal discomfort and really no significant pain no chest pain dyspnea.  Does drink alcohol daily but has been cutting back to about 1 drink per day.  Not taking NSAIDs currently.  She is not taking any antacid currently.  On.    Past Medical History:  Diagnosis Date   Acute postoperative anemia due to expected blood loss 09/19/2021   Anemia    Anesthesia complication, initial encounter 09/13/2021   Difficulty waking up from Anesthesia requiring re-intubation   Anxiety    h/o   Depression    h/o   Family history of adverse reaction to anesthesia    mom-hard time waking up   Folate deficiency 07/30/2019   Gastritis 08/19/2020   GERD (gastroesophageal reflux disease)    tums prn   Hypertension    WAS PUT ON BP MED BY PCP LAST YEAR (2018) AND BP MED MADE PT SICK SO SHE STOPPED TAKING IT-PCP MONITORS BP NOW   Palpitations 05/18/2020   Pancreatitis    Poison ivy dermatitis 11/13/2019   Vitamin B12 deficiency 01/17/2018   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    Patient Active Problem List   Diagnosis Date Noted   Muscle strain 01/09/2022   Ankle edema, bilateral 11/15/2021   Chronic anemia 11/02/2021    Hypernatremia 09/19/2021   AKI (acute kidney injury) ([place]) 09/18/2021   Alcohol abuse 09/18/2021   Malnutrition of moderate degree 09/15/2021   Perforated peptic ulcer ([place]) 09/14/2021   Chronic pancreatitis ([place]) 09/13/2021   Heartburn 09/06/2021   Bilious vomiting with nausea 09/06/2021   Back pain 07/23/2021   Vitamin B1 deficiency 05/29/2021   Macrocytosis 05/17/2021   Skin lump of arm, right 05/17/2021   Thrombocytopenia ([place]) 02/05/2021   History of seasonal allergies 05/18/2020   Fatigue 12/09/2019   Depression, recurrent ([place]) 11/13/2019   Vitamin D deficiency 07/30/2019   Hyperglycemia 07/30/2019   Encounter for annual general medical examination with abnormal findings in adult 07/30/2019   Bee sting allergy 11/11/2018   Uterine prolapse 02/28/2018   Right carpal tunnel syndrome 11/13/2017   Essential hypertension 12/24/2016   Peripheral neuropathy 12/24/2016     Physical Exam  Triage Vital Signs: ED Triage Vitals  Enc Vitals Group     BP 01/11/22 1143 (!) 154/90     Pulse Rate 01/11/22 1143 77     Resp 01/11/22 1143 18     Temp 01/11/22 1143 98.3 F (36.8 C)     Temp Source 01/11/22 1143 Oral     SpO2 01/11/22 1143 99 %     Weight 01/11/22 1147 127 lb (57.6 kg)     Height 01/11/22 1147 '5\' 1"'$  (1.549 m)  Head Circumference --      Peak Flow --      Pain Score 01/11/22 1146 9     Pain Loc --      Pain Edu? --      Excl. in [place]? --     Most recent vital signs: Vitals:   01/11/22 1143  BP: (!) 154/90  Pulse: 77  Resp: 18  Temp: 98.3 F (36.8 C)  SpO2: 99%     General: Awake, no distress.  CV:  Good peripheral perfusion.  Resp:  Normal effort.  Abd:  No distention.  Mild epigastric discomfort to palpation but abdomen is benign soft Neuro:             Awake, Alert, Oriented x 3  Other:     ED Results / Procedures / Treatments  Labs (all labs ordered are listed, but only abnormal results are displayed) Labs Reviewed  COMPREHENSIVE METABOLIC  PANEL - Abnormal; Notable for the following components:      Result Value   Glucose, Bld 106 (*)    BUN 24 (*)    Total Bilirubin 2.1 (*)    All other components within normal limits  CBC - Abnormal; Notable for the following components:   Platelets 94 (*)    All other components within normal limits  URINALYSIS, ROUTINE W REFLEX MICROSCOPIC - Abnormal; Notable for the following components:   Color, Urine YELLOW (*)    APPearance CLEAR (*)    Hgb urine dipstick SMALL (*)    Ketones, ur 80 (*)    Protein, ur 30 (*)    Bacteria, UA RARE (*)    All other components within normal limits  LIPASE, BLOOD     EKG  EKG reviewed interpreted by myself shows normal sinus rhythm normal axis T wave inversions in the anterior precordial leads   RADIOLOGY I reviewed the CT and interpreted which shows no free air   PROCEDURES:  Critical Care performed: No  Procedures  MEDICATIONS ORDERED IN ED: Medications  ondansetron (ZOFRAN) injection 4 mg (4 mg Intravenous Given 01/11/22 1321)  famotidine (PEPCID) IVPB 20 mg premix (20 mg Intravenous New Bag/Given 01/11/22 1322)  sodium chloride 0.9 % bolus 1,000 mL (1,000 mLs Intravenous New Bag/Given 01/11/22 1320)     IMPRESSION / MDM / ASSESSMENT AND PLAN / ED COURSE  I reviewed the triage vital signs and the nursing notes.                              Patient's presentation is most consistent with acute presentation with potential threat to life or bodily function.  Differential diagnosis includes, but is not limited to, gastritis, peptic ulcer, AKI, hypokalemia, dehydration  The patient is a 51 year old female history of gastritis and perforated gastric ulcer who presents with nausea and vomiting for several days inability tolerate p.o.  This seems to have started after she started Flexeril for left upper back pain.  She has minimal abdominal pain overall she looks well on exam she has tenderness in the epigastric region on exam but overall  abdominal exam is benign.  Her labs are notable for ketonuria, mildly elevated BUN to 24 but normal creatinine mildly elevated bili to 2.1, normal lipase.  Platelets are low at 94.  Suspect this is in the setting of chronic alcohol use.  Did obtain a CT given patient's abdominal discomfort with her history of recent perforated ulcer and this  shows some nonspecific thickening in the stomach wall but no perforation.  Given IV Zofran and fluids and Pepcid.  After medications patient felt significantly improved.  Nausea was resolved and she was able to tolerate p.o.  Will start patient on high-dose PPI I had a long discussion with her about GERD and gastritis precautions including avoiding any NSAIDs limiting alcohol starting a PPI and following up with GI.  We discussed advancing her diet slowly will provide antiemetic for home return precautions for intractable nausea vomiting or worsening pain.  If she is tolerating p.o. with improved symptoms no acute surgical process on CT and reassuring labs she is appropriate for discharge at this time.       FINAL CLINICAL IMPRESSION(S) / ED DIAGNOSES   Final diagnoses:  Nausea and vomiting, unspecified vomiting type     Rx / DC Orders   ED Discharge Orders          Ordered    pantoprazole (PROTONIX) 40 MG tablet  Daily        01/11/22 1419    ondansetron (ZOFRAN) 4 MG tablet  Daily PRN        01/11/22 1419             Note:  This document was prepared using Dragon voice recognition software and may include unintentional dictation errors.   Rada Hay, MD 01/11/22 [phone]

## 2022-01-15 ENCOUNTER — Encounter: Payer: Self-pay | Admitting: Family Medicine

## 2022-01-15 ENCOUNTER — Ambulatory Visit (INDEPENDENT_AMBULATORY_CARE_PROVIDER_SITE_OTHER): Payer: BC Managed Care – PPO | Admitting: Family Medicine

## 2022-01-15 VITALS — BP 132/70 | HR 62 | Temp 97.8°F | Ht 62.0 in | Wt 111.1 lb

## 2022-01-15 DIAGNOSIS — M7918 Myalgia, other site: Secondary | ICD-10-CM | POA: Diagnosis not present

## 2022-01-15 MED ORDER — DEXAMETHASONE SODIUM PHOSPHATE 100 MG/10ML IJ SOLN
10.0000 mg | Freq: Once | INTRAMUSCULAR | Status: AC
  Administered 2022-01-15: 10 mg via INTRAMUSCULAR

## 2022-01-15 NOTE — Progress Notes (Signed)
Maria Murfin T. Sharalee Witman, MD, [place] at [facility] [place], [address]  Phone: [phone]  FAX: [phone]  Maria [name] - 51 y.o. female  MRN [id number]  Date of Birth: [birth year]  Date: 01/15/2022  PCP: [name], NP  Referral: [name], NP  Chief Complaint  Patient presents with   Follow-up    Back Pain   Subjective:   Maria [name] is a 51 y.o. very pleasant female patient with Body mass index is 20.33 kg/m. who presents with the following:  Patient presents as a new consultation for back pain.  She presents with some acute upper back pain, onset is roughly 10 days.  Taking some Vicodin, also some flexeril.  Not a clear incident.  L side - parascapular.   She denies any neck pain, no low back pain.  No numbness, tingling, or radicular movement.  No weakness.  Pain isolates in the parascapular region on the left side.  She does not have a specific event that could have triggered this, she does do a lot of repetitive motion and lifting as well as twisting in the workplace as well as being active at home.  She has been taking some Tylenol, Flexeril, and sometimes Vicodin at nighttime.  She is unable to take NSAIDs, she had a perforated ulcer several months ago.  Review of Systems is noted in the HPI, as appropriate  Objective:   BP 132/70   Pulse 62   Temp 97.8 F (36.6 C) (Oral)   Ht '5\' 2"'$  (1.575 m)   Wt 111 lb 2 oz (50.4 kg)   LMP 02/24/2018 (Approximate) Comment: spotting   SpO2 98%   BMI 20.33 kg/m   GEN: No acute distress; alert,appropriate. PULM: Breathing comfortably in no respiratory distress PSYCH: Normally interactive.    GEN: alert,appropriate PSYCH: Normally interactive. Cooperative during the interview.   CERVICAL SPINE EXAM Range of motion: Flexion, extension, lateral bending, and rotation: Full minimal to none pain with terminal motion Spinous  Processes: NT SCM: NT Upper paracervical muscles: minimal tend Upper traps: Pain isolates to the left lower trap as well as the rhomboid region. C5-T1 intact, sensation and motor   Laboratory and Imaging Data:  Assessment and Plan:     ICD-10-CM   1. Rhomboid muscle pain  M79.18 dexamethasone (DECADRON) injection 10 mg     Acute rhomboid and lower trap strain.  All parascapular.  Reviewed rehab program with her regarding this region from Dr. Konrad Penta book.  Right now, I think bypassing the good and giving her some additional steroids make some sense.  We will do an intramuscular Decadron injection today. 10 mg.  Medications Discontinued During This Encounter  Medication Reason   meloxicam (MOBIC) 15 MG tablet    Follow-up as needed  Dr[employer] One speech-to-text software was used for transcription in this dictation.  Possible transcriptional errors can occur using Editor, commissioning.   Signed,  Maud Deed. Carrell Rahmani, MD   Outpatient Encounter Medications as of 01/15/2022  Medication Sig   Butalbital-APAP-Caff-Cod 50-300-40-30 MG CAPS Take by mouth. As needed   cyclobenzaprine (FLEXERIL) 5 MG tablet Take 1 tablet (5 mg total) by mouth 2 (two) times daily as needed for muscle spasms.   diazepam (VALIUM) 5 MG tablet Take by mouth.   EPINEPHrine 0.3 mg/0.3 mL IJ SOAJ injection Inject 0.3 mLs (0.3 mg total) into the muscle as needed for anaphylaxis.   HYDROcodone-acetaminophen (NORCO/VICODIN) 5-325  MG tablet Take 1 tablet by mouth every 6 (six) hours as needed.   ondansetron (ZOFRAN) 4 MG tablet Take 1 tablet (4 mg total) by mouth daily as needed for nausea or vomiting.   pantoprazole (PROTONIX) 40 MG tablet Take 1 tablet (40 mg total) by mouth daily.   [DISCONTINUED] meloxicam (MOBIC) 15 MG tablet Take 15 mg by mouth daily.   [EXPIRED] dexamethasone (DECADRON) injection 10 mg    No facility-administered encounter medications on file as of 01/15/2022.

## 2022-01-19 ENCOUNTER — Emergency Department: Payer: BC Managed Care – PPO

## 2022-01-19 ENCOUNTER — Other Ambulatory Visit: Payer: Self-pay

## 2022-01-19 ENCOUNTER — Emergency Department
Admission: EM | Admit: 2022-01-19 | Discharge: 2022-01-19 | Disposition: A | Discharge status: Home / Self Care | Payer: BC Managed Care – PPO | Attending: Emergency Medicine | Admitting: Emergency Medicine

## 2022-01-19 DIAGNOSIS — I700 Atherosclerosis of aorta: Secondary | ICD-10-CM | POA: Diagnosis not present

## 2022-01-19 DIAGNOSIS — E876 Hypokalemia: Secondary | ICD-10-CM

## 2022-01-19 DIAGNOSIS — R824 Acetonuria: Secondary | ICD-10-CM | POA: Insufficient documentation

## 2022-01-19 DIAGNOSIS — K279 Peptic ulcer, site unspecified, unspecified as acute or chronic, without hemorrhage or perforation: Secondary | ICD-10-CM

## 2022-01-19 DIAGNOSIS — R8289 Other abnormal findings on cytological and histological examination of urine: Secondary | ICD-10-CM | POA: Insufficient documentation

## 2022-01-19 DIAGNOSIS — Z720 Tobacco use: Secondary | ICD-10-CM

## 2022-01-19 DIAGNOSIS — R809 Proteinuria, unspecified: Secondary | ICD-10-CM | POA: Insufficient documentation

## 2022-01-19 DIAGNOSIS — R109 Unspecified abdominal pain: Secondary | ICD-10-CM | POA: Diagnosis not present

## 2022-01-19 DIAGNOSIS — F17200 Nicotine dependence, unspecified, uncomplicated: Secondary | ICD-10-CM | POA: Diagnosis not present

## 2022-01-19 DIAGNOSIS — R1012 Left upper quadrant pain: Secondary | ICD-10-CM | POA: Diagnosis not present

## 2022-01-19 DIAGNOSIS — R001 Bradycardia, unspecified: Secondary | ICD-10-CM | POA: Insufficient documentation

## 2022-01-19 DIAGNOSIS — I10 Essential (primary) hypertension: Secondary | ICD-10-CM | POA: Insufficient documentation

## 2022-01-19 LAB — COMPREHENSIVE METABOLIC PANEL
ALT: 14 U/L (ref 0–44)
AST: 20 U/L (ref 15–41)
Albumin: 4.1 g/dL (ref 3.5–5.0)
Alkaline Phosphatase: 90 U/L (ref 38–126)
Anion gap: 10 (ref 5–15)
BUN: 17 mg/dL (ref 6–20)
CO2: 27 mmol/L (ref 22–32)
Calcium: 9.8 mg/dL (ref 8.9–10.3)
Chloride: 100 mmol/L (ref 98–111)
Creatinine, Ser: 0.79 mg/dL (ref 0.44–1.00)
GFR, Estimated: >60 mL/min (ref >60)
Glucose, Bld: 106 mg/dL — ABNORMAL HIGH (ref 70–99)
Potassium: 3.2 mmol/L — ABNORMAL LOW (ref 3.5–5.1)
Sodium: 137 mmol/L (ref 135–145)
Total Bilirubin: 1.4 mg/dL — ABNORMAL HIGH (ref 0.3–1.2)
Total Protein: 7.7 g/dL (ref 6.5–8.1)

## 2022-01-19 LAB — CBC
HCT: 39.5 % (ref 36.0–46.0)
Hemoglobin: 13.8 g/dL (ref 12.0–15.0)
MCH: 34.1 pg — ABNORMAL HIGH (ref 26.0–34.0)
MCHC: 34.9 g/dL (ref 30.0–36.0)
MCV: 97.5 fL (ref 80.0–100.0)
Platelets: 173 10*3/uL (ref 150–400)
RBC: 4.05 MIL/uL (ref 3.87–5.11)
RDW: 12.9 % (ref 11.5–15.5)
WBC: 8.8 10*3/uL (ref 4.0–10.5)
nRBC: 0 % (ref 0.0–0.2)

## 2022-01-19 LAB — URINALYSIS, ROUTINE W REFLEX MICROSCOPIC
Bilirubin Urine: NEGATIVE
Glucose, UA: NEGATIVE mg/dL
Hgb urine dipstick: NEGATIVE
Ketones, ur: 80 mg/dL — AB
Leukocytes,Ua: NEGATIVE
Nitrite: NEGATIVE
Protein, ur: 100 mg/dL — AB
Specific Gravity, Urine: 1.025 (ref 1.005–1.030)
pH: 6 (ref 5.0–8.0)

## 2022-01-19 LAB — LIPASE, BLOOD: Lipase: 19 U/L (ref 11–51)

## 2022-01-19 MED ORDER — SUCRALFATE 1 G PO TABS
1.0000 g | ORAL_TABLET | Freq: Once | ORAL | Status: AC
  Administered 2022-01-19: 1 g via ORAL
  Filled 2022-01-19: qty 1

## 2022-01-19 MED ORDER — POTASSIUM CHLORIDE CRYS ER 20 MEQ PO TBCR
40.0000 meq | EXTENDED_RELEASE_TABLET | Freq: Once | ORAL | Status: AC
Start: 2022-01-19 — End: 2022-01-19
  Administered 2022-01-19: 40 meq via ORAL
  Filled 2022-01-19: qty 2

## 2022-01-19 MED ORDER — ONDANSETRON HCL 4 MG/2ML IJ SOLN
4.0000 mg | Freq: Once | INTRAMUSCULAR | Status: AC
  Administered 2022-01-19: 4 mg via INTRAVENOUS
  Filled 2022-01-19: qty 2

## 2022-01-19 MED ORDER — ALUM & MAG HYDROXIDE-SIMETH 200-200-20 MG/5ML PO SUSP
30.0000 mL | Freq: Once | ORAL | Status: AC
  Administered 2022-01-19: 30 mL via ORAL
  Filled 2022-01-19: qty 30

## 2022-01-19 MED ORDER — ONDANSETRON 4 MG PO TBDP: 4.0000 mg | ORAL_TABLET | Freq: Three times a day (TID) | ORAL | 0 refills | Status: AC | PRN

## 2022-01-19 MED ORDER — HYDROCODONE-ACETAMINOPHEN 5-325 MG PO TABS
1.0000 | ORAL_TABLET | Freq: Four times a day (QID) | ORAL | 0 refills | Status: AC | PRN
Start: 2022-01-19 — End: 2022-01-22

## 2022-01-19 MED ORDER — LACTATED RINGERS IV BOLUS
1000.0000 mL | Freq: Once | INTRAVENOUS | Status: AC
  Administered 2022-01-19: 1000 mL via INTRAVENOUS

## 2022-01-19 MED ORDER — PANTOPRAZOLE SODIUM 40 MG IV SOLR
40.0000 mg | Freq: Once | INTRAVENOUS | Status: AC
  Administered 2022-01-19: 40 mg via INTRAVENOUS
  Filled 2022-01-19: qty 10

## 2022-01-19 MED ORDER — IOHEXOL 300 MG/ML  SOLN
100.0000 mL | Freq: Once | INTRAMUSCULAR | Status: AC | PRN
  Administered 2022-01-19: 75 mL via INTRAVENOUS

## 2022-01-19 MED ORDER — MORPHINE SULFATE (PF) 4 MG/ML IV SOLN
4.0000 mg | Freq: Once | INTRAVENOUS | Status: AC
  Administered 2022-01-19: 4 mg via INTRAVENOUS
  Filled 2022-01-19: qty 1

## 2022-01-19 MED ORDER — SUCRALFATE 1 G PO TABS: 1.0000 g | ORAL_TABLET | Freq: Four times a day (QID) | ORAL | 0 refills | Status: DC

## 2022-01-19 MED ORDER — PANTOPRAZOLE SODIUM 40 MG PO TBEC: 40.0000 mg | DELAYED_RELEASE_TABLET | Freq: Every day | ORAL | 0 refills | Status: DC

## 2022-01-19 NOTE — Discharge Instructions (Addendum)
You are being provided a prescription for opiates (also known as narcotics) for pain control.  Opiates can be addictive and should only be used when absolutely necessary for pain control when other alternatives do not work.  We recommend you only use them for the recommended amount of time and only as prescribed.  Please do not take with other sedative medications or alcohol.  Please do not drive, operate machinery, make important decisions while taking opiates.  Please note that these medications can be addictive and have high abuse potential.  Patients can become addicted to narcotics after only taking them for a few days.  Please keep these medications locked away from children, teenagers or any family members with history of substance abuse.  Narcotic pain medicine may also make you constipated.  You may use over-the-counter medications such as MiraLAX, Colace to prevent constipation.  If you become constipated, you may use over-the-counter enemas as needed.  Itching and nausea are also common side effects of narcotic pain medication.  If you develop uncontrolled vomiting or a rash, please stop these medications and seek medical care.   Your CT today showed: IMPRESSION: 1. Ulcer along the distal anterior body of the stomach has increased in size since the prior study. Surrounding gastric wall thickening has worsened. No perforation. 2. Unchanged trace perihepatic fluid along the right liver, at least some of which appears to be subcapsular. 3. Sequela of chronic pancreatitis. 4. Aortic Atherosclerosis (ICD10-I70.0).

## 2022-01-19 NOTE — ED Provider Notes (Signed)
[facility] Provider Note    Event Date/Time   First MD Initiated Contact with Patient 01/19/22 1456     (approximate)   History   Abdominal Pain   HPI  Maria [name] is a 51 y.o. female who past medical history of depression, anxiety, palpitations, pancreatitis, GERD and peptic ulcer disease complicated by perforation surgically repaired earlier this year discharged on 4/12 who presents for evaluation of recurrence of some left upper quadrant abdominal pain associate with nausea vomiting.  No lower abdominal pain, back pain, chest pain, cough, shortness of breath, urinary symptoms, rash.  Patient denies any EtOH use or NSAID use.  She has no other acute concerns at this time.  She is worried that her perforation may have recurred.    Past Medical History:  Diagnosis Date   Acute postoperative anemia due to expected blood loss 09/19/2021   Anemia    Anesthesia complication, initial encounter 09/13/2021   Difficulty waking up from Anesthesia requiring re-intubation   Anxiety    h/o   Depression    h/o   Family history of adverse reaction to anesthesia    mom-hard time waking up   Folate deficiency 07/30/2019   Gastritis 08/19/2020   GERD (gastroesophageal reflux disease)    tums prn   Hypertension    WAS PUT ON BP MED BY PCP LAST YEAR (2018) AND BP MED MADE PT SICK SO SHE STOPPED TAKING IT-PCP MONITORS BP NOW   Palpitations 05/18/2020   Pancreatitis    Poison ivy dermatitis 11/13/2019   Vitamin B12 deficiency 01/17/2018   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     Physical Exam  Triage Vital Signs: ED Triage Vitals [01/19/22 1307]  Enc Vitals Group     BP (!) 167/77     Pulse Rate 73     Resp 18     Temp 98.6 F (37 C)     Temp Source Oral     SpO2 99 %     Weight 113 lb (51.3 kg)     Height '5\' 1"'$  (1.549 m)     Head Circumference      Peak Flow      Pain Score 8     Pain Loc      Pain Edu?      Excl. in [place]?     Most recent vital  signs: Vitals:   01/19/22 1307 01/19/22 1616  BP: (!) 167/77 (!) 153/69  Pulse: 73 (!) 57  Resp: 18 18  Temp: 98.6 F (37 C)   SpO2: 99% 98%    General: Awake, no distress.  CV:  Good peripheral perfusion.  2+ radial pulse. Resp:  Normal effort.  Clear bilaterally. Abd:  No distention. Left upper quadrant epigastrium.  Otherwise soft throughout. Other:     ED Results / Procedures / Treatments  Labs (all labs ordered are listed, but only abnormal results are displayed) Labs Reviewed  COMPREHENSIVE METABOLIC PANEL - Abnormal; Notable for the following components:      Result Value   Potassium 3.2 (*)    Glucose, Bld 106 (*)    Total Bilirubin 1.4 (*)    All other components within normal limits  CBC - Abnormal; Notable for the following components:   MCH 34.1 (*)    All other components within normal limits  URINALYSIS, ROUTINE W REFLEX MICROSCOPIC - Abnormal; Notable for the following components:   Color, Urine AMBER (*)    APPearance HAZY (*)  Ketones, ur 80 (*)    Protein, ur 100 (*)    Bacteria, UA RARE (*)    All other components within normal limits  LIPASE, BLOOD     EKG  EKG is remarkable for sinus bradycardia with ventricular rate of 55, left axis deviation with some artifact in V4 and V5 and V3 without other clear evidence of acute ischemia aside from some nonspecific change in V2.   RADIOLOGY  CT abdomen pelvis on my interpretation without evidence of gastric perforation, diverticulitis or kidney stone or acute pancreatitis although the patient's pancreas does appear fairly scarred.  I reviewed radiology interpretation and agree with the findings of ulcer in the distal anterior body of the stomach increased in size from prior with some surrounding gastric wall thickening without evidence of perforation.  There is also unchanged trace perihepatic fluid along the right liver and sequelae of chronic pancreatitis aortic  atherosclerosis.   PROCEDURES:  Critical Care performed: No  Procedures    MEDICATIONS ORDERED IN ED: Medications  potassium chloride SA (KLOR-CON M) CR tablet 40 mEq (has no administration in time range)  pantoprazole (PROTONIX) injection 40 mg (40 mg Intravenous Given 01/19/22 1531)  ondansetron (ZOFRAN) injection 4 mg (4 mg Intravenous Given 01/19/22 1529)  morphine (PF) 4 MG/ML injection 4 mg (4 mg Intravenous Given 01/19/22 1538)  alum & mag hydroxide-simeth (MAALOX/MYLANTA) 200-200-20 MG/5ML suspension 30 mL (30 mLs Oral Given 01/19/22 1536)  sucralfate (CARAFATE) tablet 1 g (1 g Oral Given 01/19/22 1537)  lactated ringers bolus 1,000 mL (1,000 mLs Intravenous New Bag/Given 01/19/22 1605)  iohexol (OMNIPAQUE) 300 MG/ML solution 100 mL (75 mLs Intravenous Contrast Given 01/19/22 1624)     IMPRESSION / MDM / ASSESSMENT AND PLAN / ED COURSE  I reviewed the triage vital signs and the nursing notes. Patient's presentation is most consistent with acute presentation with potential threat to life or bodily function.                               Differential diagnosis includes, but is not limited to recurrent peptic ulcer disease with possible perforation, gastritis, esophageal spasm or esophagitis, pancreatitis, infectious gastritis.  CMP is remarkable for K of 3.2 without any other significant electrolyte or metabolic derangements or evidence of acute hepatitis or cholestatic process.  BBC without leukocytosis or acute anemia and normal platelets.  Lipase WNL.  UA has some ketones and protein and rare bacteria but does not otherwise appear clinically infected.  CT abdomen pelvis on my interpretation without evidence of gastric perforation, diverticulitis or kidney stone or acute pancreatitis although the patient's pancreas does appear fairly scarred.  I reviewed radiology interpretation and agree with the findings of ulcer in the distal anterior body of the stomach increased in size from  prior with some surrounding gastric wall thickening without evidence of perforation.  There is also unchanged trace perihepatic fluid along the right liver and sequelae of chronic pancreatitis aortic atherosclerosis.  I suspect some progression and symptomatic peptic ulcer disease.  There is no evidence of perforation at this time.  Patient is feeling much better after above-noted analgesia.  She states she is not currently taking Protonix although on review of records seen as was previously prescribed.  I will refill this and add some Carafate.  We will also provide a couple doses of Norco to help her with severe pain over the weekend discussed returning for any new or sudden  worsening of pain.  Counseled on tobacco cessation.  Counseled on avoiding NSAIDs and alcohol.  She will call GI on Monday to arrange follow-up visit and otherwise to follow-up with her PCP.  Discharged in stable condition.  Strict and precautions advised and discussed.      FINAL CLINICAL IMPRESSION(S) / ED DIAGNOSES   Final diagnoses:  Peptic ulcer disease  Tobacco abuse  Aortic atherosclerosis (HCC)  Hypokalemia     Rx / DC Orders   ED Discharge Orders          Ordered    pantoprazole (PROTONIX) 40 MG tablet  Daily        01/19/22 1707    sucralfate (CARAFATE) 1 g tablet  4 times daily        01/19/22 1707    ondansetron (ZOFRAN-ODT) 4 MG disintegrating tablet  Every 8 hours PRN        01/19/22 1707    HYDROcodone-acetaminophen (NORCO) 5-325 MG tablet  Every 6 hours PRN        01/19/22 1708             Note:  This document was prepared using Dragon voice recognition software and may include unintentional dictation errors.   [name], MD 01/19/22 [phone]

## 2022-01-19 NOTE — ED Triage Notes (Signed)
Pt here with upper abd pain. Pt states she has a hx of epigastric ulcers and believes they may be perforated. Pt endorses N/V.

## 2022-01-30 ENCOUNTER — Emergency Department
Admission: EM | Admit: 2022-01-30 | Discharge: 2022-01-30 | Disposition: A | Discharge status: Home / Self Care | Payer: BC Managed Care – PPO | Attending: Student in an Organized Health Care Education/Training Program | Admitting: Student in an Organized Health Care Education/Training Program

## 2022-01-30 ENCOUNTER — Other Ambulatory Visit: Payer: Self-pay

## 2022-01-30 ENCOUNTER — Encounter: Payer: Self-pay | Admitting: Emergency Medicine

## 2022-01-30 ENCOUNTER — Emergency Department: Payer: BC Managed Care – PPO

## 2022-01-30 DIAGNOSIS — K861 Other chronic pancreatitis: Secondary | ICD-10-CM | POA: Diagnosis not present

## 2022-01-30 DIAGNOSIS — R1013 Epigastric pain: Secondary | ICD-10-CM | POA: Insufficient documentation

## 2022-01-30 DIAGNOSIS — K8689 Other specified diseases of pancreas: Secondary | ICD-10-CM | POA: Diagnosis not present

## 2022-01-30 DIAGNOSIS — R112 Nausea with vomiting, unspecified: Secondary | ICD-10-CM | POA: Diagnosis not present

## 2022-01-30 LAB — CBC
HCT: 40.2 % (ref 36.0–46.0)
Hemoglobin: 14 g/dL (ref 12.0–15.0)
MCH: 34.4 pg — ABNORMAL HIGH (ref 26.0–34.0)
MCHC: 34.8 g/dL (ref 30.0–36.0)
MCV: 98.8 fL (ref 80.0–100.0)
Platelets: 148 10*3/uL — ABNORMAL LOW (ref 150–400)
RBC: 4.07 MIL/uL (ref 3.87–5.11)
RDW: 12.5 % (ref 11.5–15.5)
WBC: 8.2 10*3/uL (ref 4.0–10.5)
nRBC: 0 % (ref 0.0–0.2)

## 2022-01-30 LAB — COMPREHENSIVE METABOLIC PANEL
ALT: 14 U/L (ref 0–44)
AST: 21 U/L (ref 15–41)
Albumin: 4.4 g/dL (ref 3.5–5.0)
Alkaline Phosphatase: 90 U/L (ref 38–126)
Anion gap: 10 (ref 5–15)
BUN: 16 mg/dL (ref 6–20)
CO2: 25 mmol/L (ref 22–32)
Calcium: 9.9 mg/dL (ref 8.9–10.3)
Chloride: 103 mmol/L (ref 98–111)
Creatinine, Ser: 0.78 mg/dL (ref 0.44–1.00)
GFR, Estimated: >60 mL/min (ref >60)
Glucose, Bld: 121 mg/dL — ABNORMAL HIGH (ref 70–99)
Potassium: 4.3 mmol/L (ref 3.5–5.1)
Sodium: 138 mmol/L (ref 135–145)
Total Bilirubin: 1.8 mg/dL — ABNORMAL HIGH (ref 0.3–1.2)
Total Protein: 7.7 g/dL (ref 6.5–8.1)

## 2022-01-30 LAB — LIPASE, BLOOD: Lipase: 21 U/L (ref 11–51)

## 2022-01-30 MED ORDER — ONDANSETRON HCL 4 MG/2ML IJ SOLN
4.0000 mg | Freq: Once | INTRAMUSCULAR | Status: AC
Start: 2022-01-30 — End: 2022-01-30
  Administered 2022-01-30: 4 mg via INTRAVENOUS
  Filled 2022-01-30: qty 2

## 2022-01-30 MED ORDER — SODIUM CHLORIDE 0.9 % IV BOLUS
1000.0000 mL | Freq: Once | INTRAVENOUS | Status: AC
  Administered 2022-01-30: 1000 mL via INTRAVENOUS

## 2022-01-30 MED ORDER — OXYCODONE-ACETAMINOPHEN 5-325 MG PO TABS: 1.0000 | ORAL_TABLET | ORAL | 0 refills | Status: DC | PRN

## 2022-01-30 MED ORDER — MORPHINE SULFATE (PF) 4 MG/ML IV SOLN
4.0000 mg | INTRAVENOUS | Status: DC | PRN
  Administered 2022-01-30: 4 mg via INTRAVENOUS
  Filled 2022-01-30: qty 1

## 2022-01-30 MED ORDER — SUCRALFATE 1 G PO TABS
1.0000 g | ORAL_TABLET | Freq: Once | ORAL | Status: AC
  Administered 2022-01-30: 1 g via ORAL
  Filled 2022-01-30: qty 1

## 2022-01-30 MED ORDER — PANTOPRAZOLE SODIUM 40 MG IV SOLR
40.0000 mg | Freq: Once | INTRAVENOUS | Status: AC
Start: 2022-01-30 — End: 2022-01-30
  Administered 2022-01-30: 40 mg via INTRAVENOUS
  Filled 2022-01-30: qty 10

## 2022-01-30 NOTE — ED Provider Notes (Signed)
[facility] Provider Note    Event Date/Time   First MD Initiated Contact with Patient 01/30/22 [phone]     (approximate)   History   Abdominal Pain and Emesis   HPI  Maria [name] is a 51 y.o. female with a history of PUD history of perforated gastric ulcer status postsurgery on PPI and treated for H. pylori presents to the ER for evaluation of epigastric discomfort nausea vomiting.  Symptoms similar to previous recent presentation to this ER.  Denies any melena no hematochezia no coffee-ground emesis.  States that she has been eating bland diet including soup, macaroni cheese and applesauce.  Denies any chest pain or shortness of breath.  No fevers.  Has been ongoing for little more than 3 days at this point.  There is no significant change in the symptoms that brought her to the ER today.  Just based on her history she wanted to be evaluated.     Physical Exam   Triage Vital Signs: ED Triage Vitals  Enc Vitals Group     BP 01/30/22 0317 (!) 150/73     Pulse Rate 01/30/22 0317 74     Resp 01/30/22 0317 18     Temp 01/30/22 0317 98.3 F (36.8 C)     Temp Source 01/30/22 0317 Oral     SpO2 01/30/22 0317 99 %     Weight 01/30/22 0317 103 lb (46.7 kg)     Height 01/30/22 0317 '5\' 1"'$  (1.549 m)     Head Circumference --      Peak Flow --      Pain Score 01/30/22 0321 8     Pain Loc --      Pain Edu? --      Excl. in [place]? --     Most recent vital signs: Vitals:   01/30/22 0520 01/30/22 0600  BP: (!) 175/77 (!) 155/71  Pulse: (!) 57 (!) 54  Resp: 17 18  Temp:    SpO2: 99% 100%     Constitutional: Alert  Eyes: Conjunctivae are normal.  Head: Atraumatic. Nose: No congestion/rhinnorhea. Mouth/Throat: Mucous membranes are moist.   Neck: Painless ROM.  Cardiovascular:   Good peripheral circulation. Respiratory: Normal respiratory effort.  No retractions.  Gastrointestinal: Soft and nontender in all four quadrants Musculoskeletal:  no  deformity Neurologic:  MAE spontaneously. No gross focal neurologic deficits are appreciated.  Skin:  Skin is warm, dry and intact. No rash noted. Psychiatric: Mood and affect are normal. Speech and behavior are normal.    ED Results / Procedures / Treatments   Labs (all labs ordered are listed, but only abnormal results are displayed) Labs Reviewed  COMPREHENSIVE METABOLIC PANEL - Abnormal; Notable for the following components:      Result Value   Glucose, Bld 121 (*)    Total Bilirubin 1.8 (*)    All other components within normal limits  CBC - Abnormal; Notable for the following components:   MCH 34.4 (*)    Platelets 148 (*)    All other components within normal limits  LIPASE, BLOOD  URINALYSIS, ROUTINE W REFLEX MICROSCOPIC     EKG  ED ECG REPORT I, Merlyn Lot, the attending physician, personally viewed and interpreted this ECG.   Date: 01/30/2022  EKG Time: 3:21  Rate: 70  Rhythm: sinus  Axis: normal  Intervals:normal  ST&T Change: nonspecific st abn, no stemi, no depressions    RADIOLOGY Please see ED Course for my review  and interpretation.  I personally reviewed all radiographic images ordered to evaluate for the above acute complaints and reviewed radiology reports and findings.  These findings were personally discussed with the patient.  Please see medical record for radiology report.    PROCEDURES:  Critical Care performed: No  Procedures   MEDICATIONS ORDERED IN ED: Medications  morphine (PF) 4 MG/ML injection 4 mg (4 mg Intravenous Given 01/30/22 0528)  pantoprazole (PROTONIX) injection 40 mg (40 mg Intravenous Given 01/30/22 0517)  ondansetron (ZOFRAN) injection 4 mg (4 mg Intravenous Given 01/30/22 0517)  sodium chloride 0.9 % bolus 1,000 mL (1,000 mLs Intravenous New Bag/Given 01/30/22 0523)  sucralfate (CARAFATE) tablet 1 g (1 g Oral Given 01/30/22 0517)     IMPRESSION / MDM / ASSESSMENT AND PLAN / ED COURSE  I reviewed the triage vital  signs and the nursing notes.                              Differential diagnosis includes, but is not limited to, gastritis, enteritis, perforation, SBO, dehydration, electrolyte abnormality  Patient presented to the ER for evaluation of symptoms as described above.  Based on her past surgical history this presenting complaint could reflect a potentially life-threatening illness therefore the patient will be placed on continuous pulse oximetry and telemetry for monitoring.  Laboratory evaluation will be sent to evaluate for the above complaints.  She is nontoxic however has a benign abdominal exam.  We will do her x-ray.  Given duration of symptoms of a lower suspicion for perforation and also given her benign exam more likely PUD.  Her EKG is nonischemic and her symptoms do not seem cardiac in nature.  Will give symptomatic management and observe and reassess.    Clinical Course as of 01/30/22 0655  Tue Jan 30, 2022  0429 X-ray on my review and interpretation without any evidence of perforation. [PR]  0456 Patient's blood work is reassuring.  No white count.  She does have history of PUD perforation but given 3 days of pain and symptoms with normal weight white count I have a very low suspicion that her presentation today reflects perforation or other acute intra-abdominal process. [PR]  0609 Patient reassessed and feeling improved.  Will p.o. challenge. [PR]  [phone] Patient tolerating p.o.  She feels well.  Feels comfortable plan for outpatient follow-up.  She has appointment scheduled with GI this coming week.  We discussed strict return precautions.  Patient agreeable to plan. [PR]    Clinical Course User Index [PR] Merlyn Lot, MD    FINAL CLINICAL IMPRESSION(S) / ED DIAGNOSES   Final diagnoses:  Epigastric pain     Rx / DC Orders   ED Discharge Orders          Ordered    oxyCODONE-acetaminophen (PERCOCET) 5-325 MG tablet  Every 4 hours PRN        01/30/22 4431              Note:  This document was prepared using Dragon voice recognition software and may include unintentional dictation errors.    Merlyn Lot, MD 01/30/22 [phone]

## 2022-01-30 NOTE — ED Triage Notes (Signed)
Pt to ED via POV with c/o epigastric abd pain. Pt states has been unable to tolerate PO at this time. Pt A&O x4. Pt states hx of epigastric ulcers that have ruptured in the past and states concerns regarding possible perforated ulcer.

## 2022-01-30 NOTE — Discharge Instructions (Signed)

## 2022-02-05 ENCOUNTER — Telehealth: Payer: Self-pay | Admitting: Primary Care

## 2022-02-05 NOTE — Telephone Encounter (Signed)
  Encourage patient to contact the pharmacy for refills or they can request refills through [facility]  Did the patient contact the pharmacy: No   LAST APPOINTMENT DATE: 11/15/21  NEXT APPOINTMENT DATE: N/A  MEDICATION: oxyCODONE-acetaminophen (PERCOCET) 5-325 MG tablet  Is the patient out of medication? No  If not, how much is left? 2 pills  PHARMACY: [id number] [place] Let patient know to contact pharmacy at the end of the day to make sure medication is ready.  Please notify patient to allow 48-72 hours to process

## 2022-02-05 NOTE — Telephone Encounter (Signed)
I am not able to refill her oxycodone-acetaminophen pain medication. I am more than willing to see her in the office at her convenience for further evaluation of her pain.

## 2022-02-06 NOTE — Telephone Encounter (Signed)
Called patient she has appointment this week. Had received call from office and appointment has been made.

## 2022-02-08 ENCOUNTER — Ambulatory Visit (INDEPENDENT_AMBULATORY_CARE_PROVIDER_SITE_OTHER): Payer: BC Managed Care – PPO | Admitting: Primary Care

## 2022-02-08 ENCOUNTER — Encounter: Payer: Self-pay | Admitting: Primary Care

## 2022-02-08 VITALS — BP 136/64 | HR 84 | Temp 98.6°F | Ht 61.0 in | Wt 106.0 lb

## 2022-02-08 DIAGNOSIS — N898 Other specified noninflammatory disorders of vagina: Secondary | ICD-10-CM | POA: Diagnosis not present

## 2022-02-08 DIAGNOSIS — K2970 Gastritis, unspecified, without bleeding: Secondary | ICD-10-CM | POA: Diagnosis not present

## 2022-02-08 DIAGNOSIS — K279 Peptic ulcer, site unspecified, unspecified as acute or chronic, without hemorrhage or perforation: Secondary | ICD-10-CM | POA: Diagnosis not present

## 2022-02-08 DIAGNOSIS — K861 Other chronic pancreatitis: Secondary | ICD-10-CM | POA: Diagnosis not present

## 2022-02-08 DIAGNOSIS — K275 Chronic or unspecified peptic ulcer, site unspecified, with perforation: Secondary | ICD-10-CM

## 2022-02-08 NOTE — Patient Instructions (Addendum)
Start Pantoprazole 40 mg everyday 30 mins prior to breakfast. Continue the Sucralfate 4 times a day before meals and bedtime.   Referral has been sent to GI. You should get a phone call in two weeks. Please call the office if you don't hear from them.   We will be in touch tomorrow regarding your vaginal swab  It was a pleasure to see you today!

## 2022-02-08 NOTE — Assessment & Plan Note (Addendum)
Swab sent today to rule out B.V. or Candida.   Will treat once results come back if applicable.  I evaluated patient, was consulted regarding treatment, and agree with assessment and plan per Tinnie Gens, RN, DNP student.   [name], NP-C

## 2022-02-08 NOTE — Progress Notes (Signed)
Established Patient Office Visit  Subjective   Patient ID: Maria [name], female    DOB: 05/25/1971  Age: 51 y.o. MRN: [id number]  Chief Complaint  Patient presents with   Pain    Can not get seen by GI for several months.     HPI:    Maria [name] is a 51 year old pleasant female with a history of hypertension, chronic pancreatitis, perforated peptic ulcer, AKI, thrombocytopenia, chronic anemia, depression who presents today for a hospital follow up.   She was seen in the Emergency Department on 01/30/22 for epigastric pain. She has a history of perforated gastric ulcer and had surgery in May, 2023. She was treated for H. Pylori with PPI. ED work up included IV Pantoprazole 40 mg; IV Zofran, Sucralfate and Morphine 4 mg. She was also seen in the Emergency Department on 01/11/22 & 01/19/22 for the same thing. She was treated with Pantoprazole and Zofran. She had a CT done on 01/19/22 which showed ulcer along the distal anterior body of the stomach increased in size since the prior study. Surrounding gastric wall thickening has worsened. No perforation.   Since her discharge, she is still having epigastric pain that has been intermittent since her surgery. Reports pain 6/10 today as dull and constant pain. She has not followed up with the GI. She denies any nausea and vomiting as long as she is watching her diet. She has not had an endoscopy. She would like a referral to [facility]. She endorses eating a bland diet. She is currently on Sucralfate with no difference in her pain. She has not been taking the PO Pantoprazole.   Vaginal itching: Symptoms started two days ago. She reports burning and itching. She has been using the Monistat cream with minimal relief. Denies any recent use of antibiotics.   Past Medical History:  Diagnosis Date   Acute postoperative anemia due to expected blood loss 09/19/2021   Anemia    Anesthesia complication, initial encounter 09/13/2021   Difficulty waking  up from Anesthesia requiring re-intubation   Anxiety    h/o   Depression    h/o   Family history of adverse reaction to anesthesia    mom-hard time waking up   Folate deficiency 07/30/2019   Gastritis 08/19/2020   GERD (gastroesophageal reflux disease)    tums prn   Hypertension    WAS PUT ON BP MED BY PCP LAST YEAR (2018) AND BP MED MADE PT SICK SO SHE STOPPED TAKING IT-PCP MONITORS BP NOW   Palpitations 05/18/2020   Pancreatitis    Poison ivy dermatitis 11/13/2019   Vitamin B12 deficiency 01/17/2018   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   Past Surgical History:  Procedure Laterality Date   ANTERIOR AND POSTERIOR REPAIR WITH SACROSPINOUS FIXATION N/A 03/13/2018   Procedure: ANTERIOR REPAIR;  Surgeon: [name], MD;  Location: [facility];  Service: Gynecology;  Laterality: N/A;   INDUCED ABORTION     x2   IR CM INJ ANY COLONIC TUBE W/FLUORO  09/29/2021   LAPAROTOMY N/A 09/13/2021   Procedure: EXPLORATORY LAPAROTOMY-Graham PATCH repair;  Surgeon: [name], DO;  Location: [facility];  Service: General;  Laterality: N/A;   LAPAROTOMY N/A 09/17/2021   Procedure: EXPLORATORY LAPAROTOMY;  Surgeon: [name], MD;  Location: [facility];  Service: General;  Laterality: N/A;   REPAIR OF PERFORATED ULCER N/A 09/17/2021   Procedure: REPAIR OF PERFORATED ULCER;  Surgeon: [name], MD;  Location: [facility];  Service: General;  Laterality: N/A;   VAGINAL HYSTERECTOMY N/A 03/13/2018   Procedure: HYSTERECTOMY VAGINAL;  Surgeon: [name], MD;  Location: [facility];  Service: Gynecology;  Laterality: N/A;   Social History   Socioeconomic History   Marital status: Married    Spouse name: Not on file   Number of children: Not on file   Years of education: Not on file   Highest education level: Not on file  Occupational History   Not on file  Tobacco Use   Smoking status: Every Day    Packs/day: 1.00    Years: 20.00    Total pack years: 20.00    Types: Cigarettes   Smokeless  tobacco: Never  Vaping Use   Vaping Use: Never used  Substance and Sexual Activity   Alcohol use: Yes    Alcohol/week: 2.0 standard drinks of alcohol    Types: 2 Glasses of wine per week    Comment: two glasses wine/beer every other day   Drug use: No   Sexual activity: Yes  Other Topics Concern   Not on file  Social History Narrative   Married.   1 child.    Working at [employer] at [employer].     Enjoys swimming, camping   Social Determinants of Radio broadcast assistant Strain: Not on file  Food Insecurity: Not on file  Transportation Needs: Not on file  Physical Activity: Not on file  Stress: Not on file  Social Connections: Not on file  Intimate Partner Violence: Not on file   Allergies  Allergen Reactions   Anesthesia S-I-40 [Propofol]    Bee Venom Swelling   Bupropion Anxiety and Other (See Comments)    Worsened anxiety      ROS    Objective:     BP 136/64   Pulse 84   Temp 98.6 F (37 C) (Oral)   Ht '5\' 1"'$  (1.549 m)   Wt 106 lb (48.1 kg)   LMP 02/24/2018 (Approximate) Comment: spotting   SpO2 99%   BMI 20.03 kg/m  BP Readings from Last 3 Encounters:  02/08/22 136/64  01/30/22 139/78  01/19/22 (!) 153/69      Physical Exam   No results found for any visits on 02/08/22.  Last CBC Lab Results  Component Value Date   WBC 8.2 01/30/2022   HGB 14.0 01/30/2022   HCT 40.2 01/30/2022   MCV 98.8 01/30/2022   MCH 34.4 (H) 01/30/2022   RDW 12.5 01/30/2022   PLT 148 (L) 56/38/7564   Last metabolic panel Lab Results  Component Value Date   GLUCOSE 121 (H) 01/30/2022   NA 138 01/30/2022   K 4.3 01/30/2022   CL 103 01/30/2022   CO2 25 01/30/2022   BUN 16 01/30/2022   CREATININE 0.78 01/30/2022   GFRNONAA >60 01/30/2022   CALCIUM 9.9 01/30/2022   PHOS 2.9 10/05/2021   PROT 7.7 01/30/2022   ALBUMIN 4.4 01/30/2022   BILITOT 1.8 (H) 01/30/2022   ALKPHOS 90 01/30/2022   AST 21 01/30/2022   ALT 14 01/30/2022   ANIONGAP 10 01/30/2022      The  ASCVD Risk score (Arnett DK, et al., 2019) failed to calculate for the following reasons:   The valid HDL cholesterol range is 20 to 100 mg/dL   The valid total cholesterol range is 130 to 320 mg/dL    Assessment & Plan:   Problem List Items Addressed This Visit       Digestive   Perforated peptic ulcer (  [place])    Multiple recent hospitalizations related to epigastric pain.   Referral to GI for managed for the ulcer that has increased in size and worsening gastric wall thickening.   Hospital notes, labs and imaging reviewed.        No follow-ups on file.    Tinnie Gens, BSN-RN, DNP STUDENT

## 2022-02-08 NOTE — Assessment & Plan Note (Addendum)
Multiple recent ED visits related to epigastric pain. Encouraged patient to take medication as prescribed. start Pantoprazole 40 mg once daily and Sucralfate four times a day with meals.  Referral to GI placed for management of increasing peptic ulcer and worsening gastric wall thickening.  Hopefully they can get her in sooner for evaluation.  Hospital notes, labs and imaging reviewed.  We will not refill narcotics as this is not best practice for pain management given her symptoms and hospital findings.  I evaluated patient, was consulted regarding treatment, and agree with assessment and plan per Tinnie Gens, RN, DNP student.   [name], NP-C

## 2022-02-08 NOTE — Assessment & Plan Note (Deleted)
Multiple recent hospitalizations related to epigastric pain. Encouraged patient to take medication as prescribed. Continue Pantoprazole 40 mg once daily and Sucralfate four times a day.   Referral to GI for managed for the ulcer that has increased in size and worsening gastric wall thickening.   Hospital notes, labs and imaging reviewed.

## 2022-02-08 NOTE — Assessment & Plan Note (Signed)
Reviewed lipase levels from multiple ED visits. At this point pancreatitis is low on the differentials list.

## 2022-02-08 NOTE — Progress Notes (Signed)
Subjective:    Patient ID: Maria [name], female    DOB: 08/02/1970, 51 y.o.   MRN: [id number]  HPI  Maria [name] is a very pleasant 51 y.o. female with a history of peptic ulcer disease, perforated peptic ulcer, chronic pancreatitis, H. pylori, thrombocytopenia, AKI, who presents today to discuss continued abdominal pain and vaginal itching.  1) Abdominal Pain/PUD: She is status post 2 operations for perforated gastric ulcer since April 2023.  Today she endorses overall her pain has continued since surgery.  She has followed up with her surgeon since surgery and was released a few months ago.  She is presented to the emergency department 3 times since mid July 2023 for ongoing symptoms.  Her most recent visit was on 01/30/2022 for epigastric pain, nausea with vomiting. She was provided with Rx for oxycodone and told to follow up with GI.   During her ED visit on 01/19/2022 she underwent CT abdomen pelvis which showed increased gastric ulcer with gastric wall thickening, no perforation.  She was prescribed pantoprazole 40 mg daily, Carafate 1 g tablet as needed and hydrocodone.  She was advised to follow-up with GI.  She has not followed up with GI, does have an appointment scheduled for December 2023.  She is asking for refill of her oxycodone today due to her continued pain.  She is not taking pantoprazole or Carafate as prescribed.  She denies constipation, diarrhea, fevers.  She is eating a bland diet.  2) Vaginal Itching:  Symptoms include vaginal itching and burning that began two days ago. She's been using Monostat cream OTC for a the last two days with temporary improvement.  She suspects she has a vaginal yeast infection.  She denies vaginal discharge, recent antibiotic use, urinary symptoms.   Review of Systems  Gastrointestinal:  Positive for abdominal pain, nausea and vomiting. Negative for constipation and diarrhea.  Genitourinary:  Negative for dysuria, frequency and  vaginal discharge.       Vaginal itching         Past Medical History:  Diagnosis Date   Acute postoperative anemia due to expected blood loss 09/19/2021   Anemia    Anesthesia complication, initial encounter 09/13/2021   Difficulty waking up from Anesthesia requiring re-intubation   Anxiety    h/o   Depression    h/o   Family history of adverse reaction to anesthesia    mom-hard time waking up   Folate deficiency 07/30/2019   Gastritis 08/19/2020   GERD (gastroesophageal reflux disease)    tums prn   Hypertension    WAS PUT ON BP MED BY PCP LAST YEAR (2018) AND BP MED MADE PT SICK SO SHE STOPPED TAKING IT-PCP MONITORS BP NOW   Palpitations 05/18/2020   Pancreatitis    Perforated peptic ulcer ([place]) 09/14/2021   Poison ivy dermatitis 11/13/2019   Vitamin B12 deficiency 01/17/2018   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    Social History   Socioeconomic History   Marital status: Married    Spouse name: Not on file   Number of children: Not on file   Years of education: Not on file   Highest education level: Not on file  Occupational History   Not on file  Tobacco Use   Smoking status: Every Day    Packs/day: 1.00    Years: 20.00    Total pack years: 20.00    Types: Cigarettes   Smokeless tobacco: Never  Vaping Use   Vaping Use: Never  used  Substance and Sexual Activity   Alcohol use: Yes    Alcohol/week: 2.0 standard drinks of alcohol    Types: 2 Glasses of wine per week    Comment: two glasses wine/beer every other day   Drug use: No   Sexual activity: Yes  Other Topics Concern   Not on file  Social History Narrative   Married.   1 child.    Working at [employer] at [employer].     Enjoys swimming, camping   Social Determinants of Health   Financial Resource Strain: Not on file  Food Insecurity: Not on file  Transportation Needs: Not on file  Physical Activity: Not on file  Stress: Not on file  Social Connections: Not on file  Intimate Partner Violence: Not on  file    Past Surgical History:  Procedure Laterality Date   ANTERIOR AND POSTERIOR REPAIR WITH SACROSPINOUS FIXATION N/A 03/13/2018   Procedure: ANTERIOR REPAIR;  Surgeon: [name], MD;  Location: [facility];  Service: Gynecology;  Laterality: N/A;   INDUCED ABORTION     x2   IR CM INJ ANY COLONIC TUBE W/FLUORO  09/29/2021   LAPAROTOMY N/A 09/13/2021   Procedure: EXPLORATORY LAPAROTOMY-Graham PATCH repair;  Surgeon: [name], DO;  Location: [facility];  Service: General;  Laterality: N/A;   LAPAROTOMY N/A 09/17/2021   Procedure: EXPLORATORY LAPAROTOMY;  Surgeon: [name], MD;  Location: [facility];  Service: General;  Laterality: N/A;   REPAIR OF PERFORATED ULCER N/A 09/17/2021   Procedure: REPAIR OF PERFORATED ULCER;  Surgeon: [name], MD;  Location: [facility];  Service: General;  Laterality: N/A;   VAGINAL HYSTERECTOMY N/A 03/13/2018   Procedure: HYSTERECTOMY VAGINAL;  Surgeon: [name], MD;  Location: [facility];  Service: Gynecology;  Laterality: N/A;    Family History  Problem Relation Age of Onset   Arthritis Mother    Arthritis Father    Alcohol abuse Father    Hypertension Father    Heart disease Father    Aneurysm Father    Breast cancer Maternal Grandmother    Breast cancer Paternal Grandmother     Allergies  Allergen Reactions   Anesthesia S-I-40 [Propofol]    Bee Venom Swelling   Bupropion Anxiety and Other (See Comments)    Worsened anxiety    Current Outpatient Medications on File Prior to Visit  Medication Sig Dispense Refill   cyclobenzaprine (FLEXERIL) 5 MG tablet Take 5 mg by mouth 3 (three) times daily as needed for muscle spasms.     EPINEPHrine 0.3 mg/0.3 mL IJ SOAJ injection Inject 0.3 mLs (0.3 mg total) into the muscle as needed for anaphylaxis. 1 Device 0   pantoprazole (PROTONIX) 40 MG tablet Take 1 tablet (40 mg total) by mouth daily. 30 tablet 0   sucralfate (CARAFATE) 1 g tablet Take 1 tablet (1 g total) by mouth 4 (four) times  daily for 5 days. 20 tablet 0   No current facility-administered medications on file prior to visit.    BP 136/64   Pulse 84   Temp 98.6 F (37 C) (Oral)   Ht '5\' 1"'$  (1.549 m)   Wt 106 lb (48.1 kg)   LMP 02/24/2018 (Approximate) Comment: spotting   SpO2 99%   BMI 20.03 kg/m  Objective:   Physical Exam Constitutional:      General: She is not in acute distress. Cardiovascular:     Rate and Rhythm: Normal rate and regular rhythm.  Pulmonary:  Effort: Pulmonary effort is normal.     Breath sounds: Normal breath sounds.  Abdominal:     General: Bowel sounds are normal.     Palpations: Abdomen is soft.     Tenderness: There is abdominal tenderness in the epigastric area and periumbilical area. There is no guarding.  Musculoskeletal:     Cervical back: Neck supple.  Skin:    General: Skin is warm and dry.           Assessment & Plan:   Problem List Items Addressed This Visit       Digestive   Chronic pancreatitis ([place])    Reviewed lipase levels from multiple ED visits. At this point pancreatitis is low on the differentials list.      PUD (peptic ulcer disease) - Primary    Multiple recent ED visits related to epigastric pain. Encouraged patient to take medication as prescribed. start Pantoprazole 40 mg once daily and Sucralfate four times a day with meals.  Referral to GI placed for management of increasing peptic ulcer and worsening gastric wall thickening.  Hopefully they can get her in sooner for evaluation.  Hospital notes, labs and imaging reviewed.  We will not refill narcotics as this is not best practice for pain management given her symptoms and hospital findings.  I evaluated patient, was consulted regarding treatment, and agree with assessment and plan per Tinnie Gens, RN, DNP student.   [name], NP-C       Relevant Orders   Ambulatory referral to Gastroenterology     Genitourinary   Vaginal itching    Swab sent today to rule out B.V.  or Candida.   Will treat once results come back if applicable.  I evaluated patient, was consulted regarding treatment, and agree with assessment and plan per Tinnie Gens, RN, DNP student.   [name], NP-C       Relevant Orders   WET PREP BY MOLECULAR PROBE   Other Visit Diagnoses     Gastritis without bleeding, unspecified chronicity, unspecified gastritis type       Relevant Orders   Ambulatory referral to Gastroenterology          [name], NP

## 2022-02-09 LAB — WET PREP BY MOLECULAR PROBE
Candida species: NOT DETECTED
Gardnerella vaginalis: NOT DETECTED
MICRO NUMBER:: 13762052
SPECIMEN QUALITY:: ADEQUATE
Trichomonas vaginosis: NOT DETECTED

## 2022-02-10 DIAGNOSIS — Z931 Gastrostomy status: Secondary | ICD-10-CM | POA: Diagnosis not present

## 2022-02-10 DIAGNOSIS — D62 Acute posthemorrhagic anemia: Secondary | ICD-10-CM | POA: Diagnosis not present

## 2022-02-10 DIAGNOSIS — K275 Chronic or unspecified peptic ulcer, site unspecified, with perforation: Secondary | ICD-10-CM | POA: Diagnosis not present

## 2022-02-10 DIAGNOSIS — E440 Moderate protein-calorie malnutrition: Secondary | ICD-10-CM | POA: Diagnosis not present

## 2022-03-09 IMAGING — CT CT ABD-PELV W/ CM
2 of 5 series · 15 of 46 positions shown, 17 images · IV contrast (APPLIED)
Comparison: CT of the abdomen 11/21/2006

CLINICAL DATA: Abdominal pain, acute, nonlocalized.

EXAM:
CT ABDOMEN AND PELVIS WITH CONTRAST
TECHNIQUE: Multidetector CT imaging of the abdomen and pelvis was performed
using the standard protocol following bolus administration of
intravenous contrast.

[Series 2: abdomen 5.0 · axial · 0.66mm/px · z∈[-1223,-808]mm · 12 of 97 slices shown, 14 images]
[im 7/97  soft-tissue]
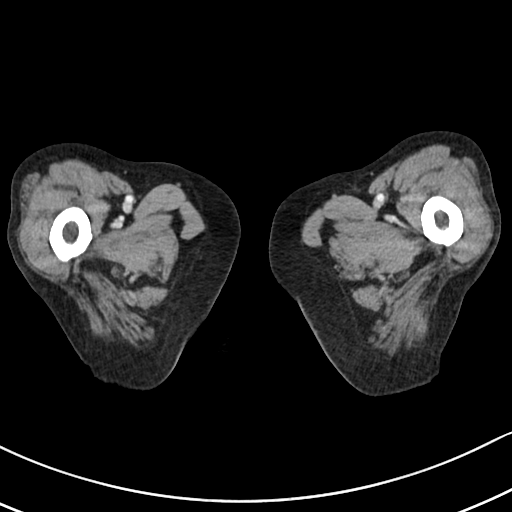
[im 7/97  bone]
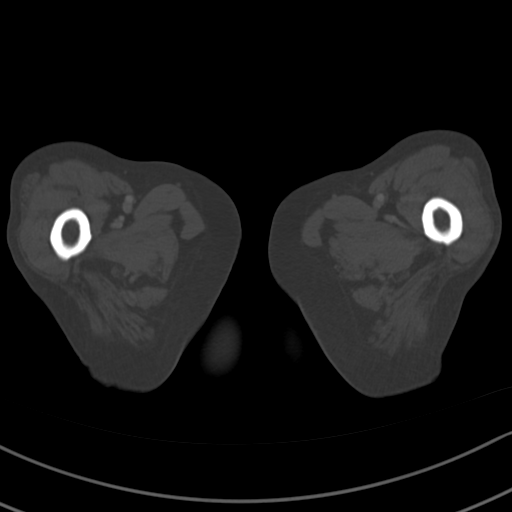
[im 14/97  soft-tissue]
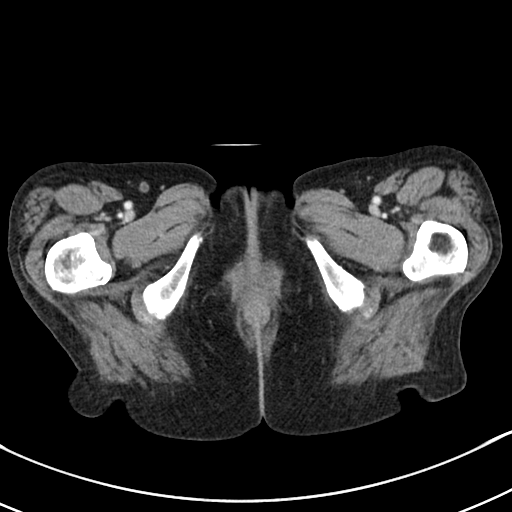
[im 21/97  soft-tissue]
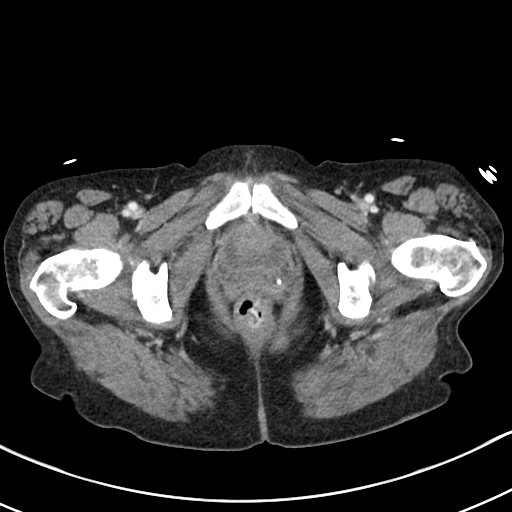
[im 28/97  soft-tissue]
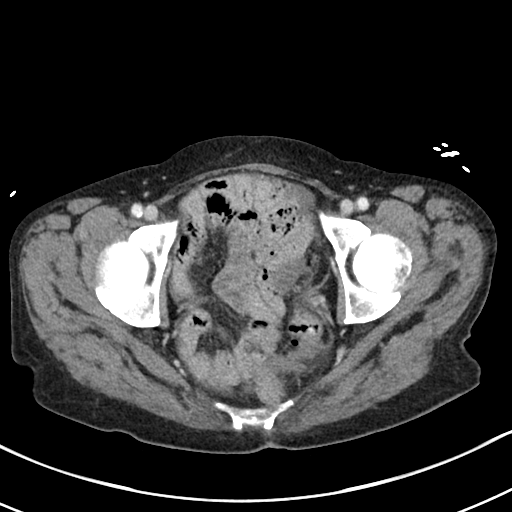
[im 35/97  soft-tissue]
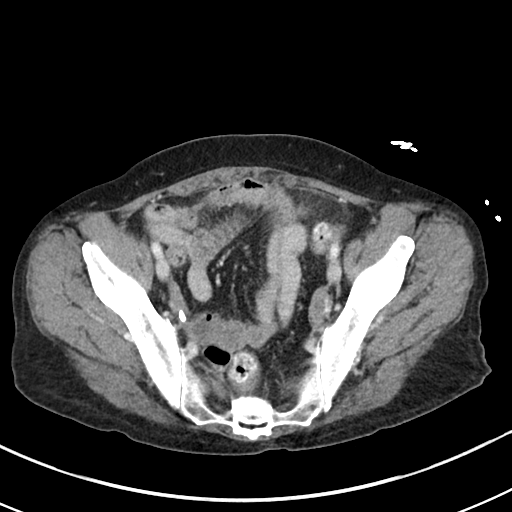
[im 42/97  soft-tissue]
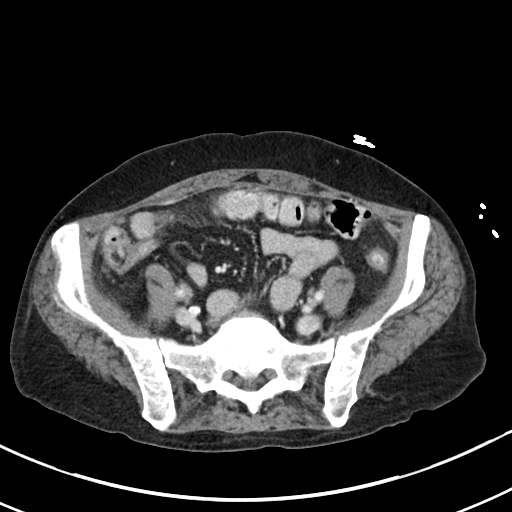
[im 55/97  soft-tissue]
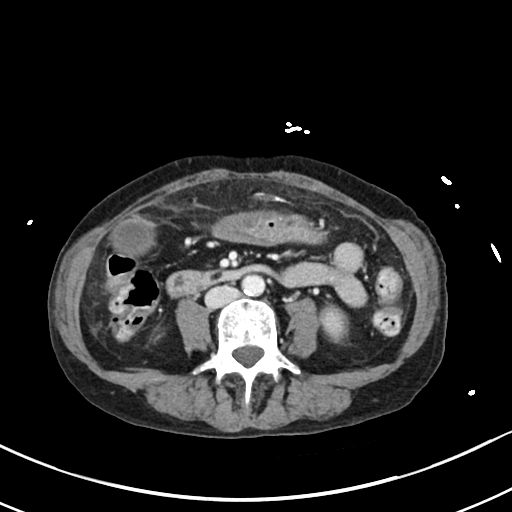
[im 62/97  soft-tissue]
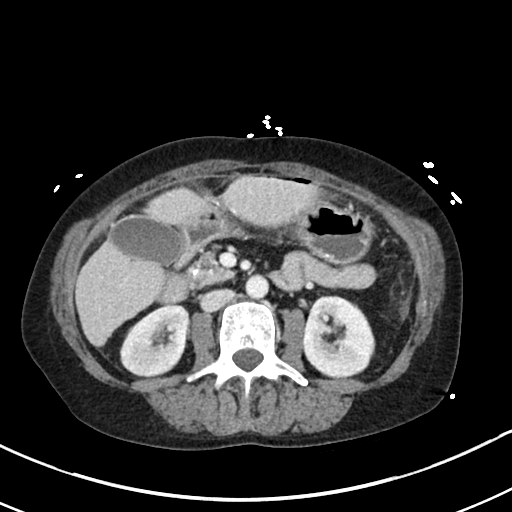
[im 69/97  soft-tissue]
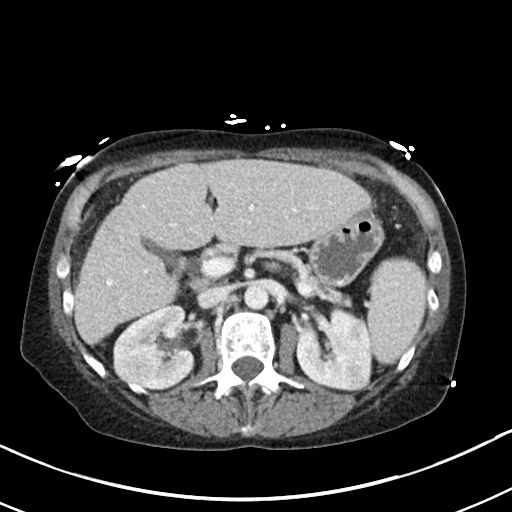
[im 69/97  bone]
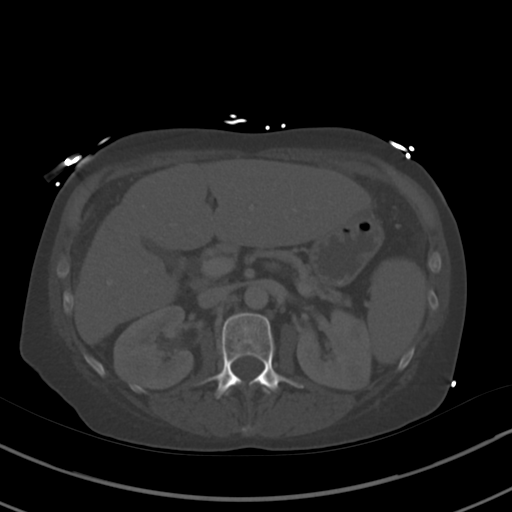
[im 76/97  soft-tissue]
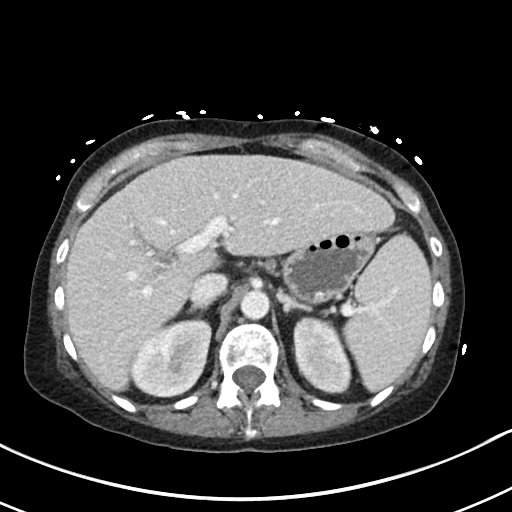
[im 83/97  soft-tissue]
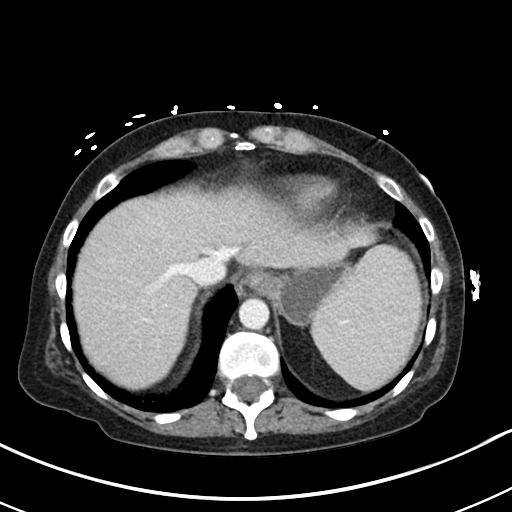
[im 90/97  soft-tissue]
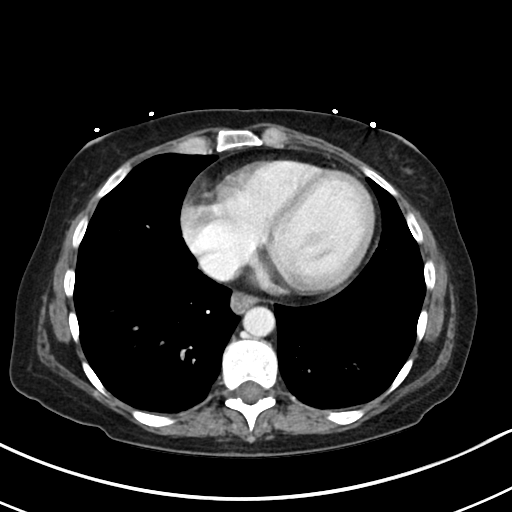

[Series 5: abdomen 3.0 mpr cor · coronal · 0.69mm/px · 3 of 73 slices shown]
[im 25/73  soft-tissue]
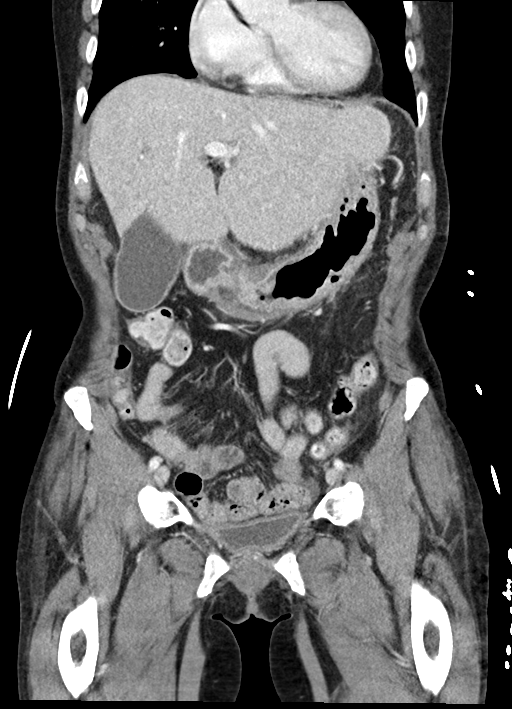
[im 33/73  soft-tissue]
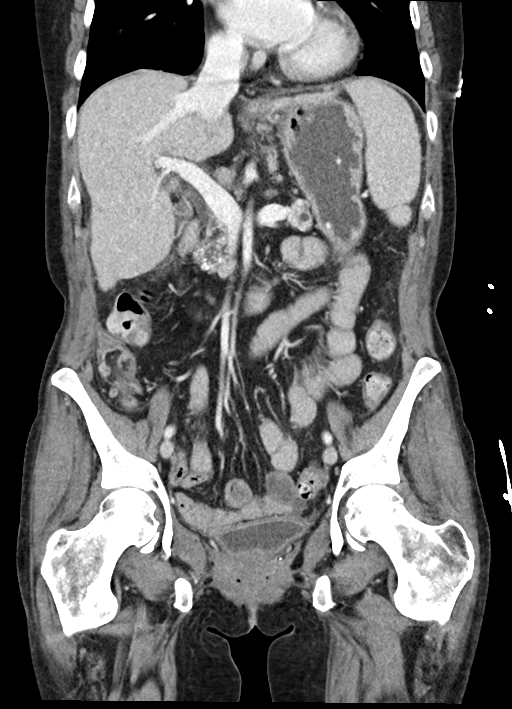
[im 41/73  soft-tissue]
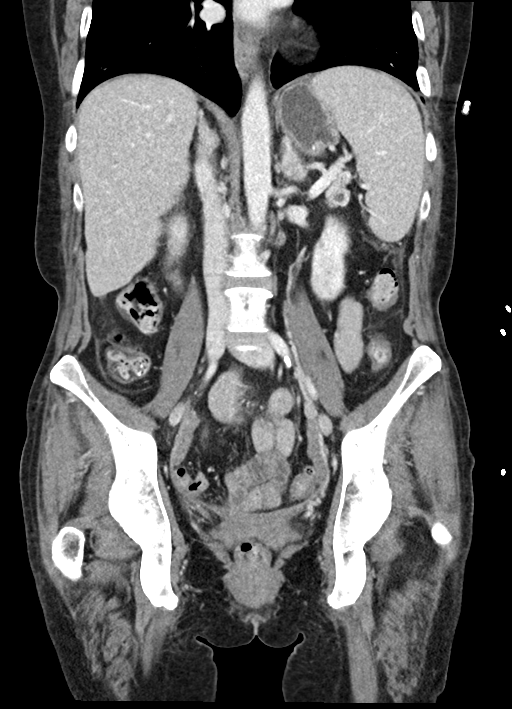

[15 of 46 positions shown; findings below may reference images not displayed]

RADIATION DOSE REDUCTION: This exam was performed according to the
departmental dose-optimization program which includes automated
exposure control, adjustment of the mA and/or kV according to
patient size and/or use of iterative reconstruction technique.

CONTRAST:  80mL OMNIPAQUE IOHEXOL 300 MG/ML  SOLN
FINDINGS: Lower chest: The lung bases are clear without focal nodule, mass, or
airspace disease. Heart size is normal. No significant pleural or
pericardial effusion is present.

Hepatobiliary: No focal liver abnormality is seen. No gallstones,
gallbladder wall thickening, or biliary dilatation. Diffuse fatty
infiltration of the liver noted.

Pancreas: Calcifications are present in the pancreatic head. The
pancreatic duct is dilated. There is some atrophy throughout the
body and tail. No discrete lesion is present.

Spleen: Normal in size without focal abnormality.

Adrenals/Urinary Tract: Adrenal glands are normal. Kidneys are
unremarkable. No stone or mass lesion is present. No obstruction is
present. Ureters are within normal limits. The urinary bladder is
unremarkable.

Stomach/Bowel: Circumferential gastric wall thickening is present.
No obstruction is present. The duodenum is unremarkable. Small bowel
is within normal limits. Terminal ileum is unremarkable. The
appendix is visualized and normal. The ascending and transverse
colon are within normal limits. The descending and sigmoid colon are
unremarkable. Rectum is normal.

Vascular/Lymphatic: Atherosclerotic calcifications are present the
aorta branch vessels without aneurysm.

Reproductive: Status post hysterectomy. No adnexal masses.

Other: No abdominal wall hernia or abnormality. No abdominopelvic
ascites.

Musculoskeletal: No acute or significant osseous findings.
IMPRESSION: 1. Circumferential gastric wall thickening compatible with acute
gastritis.
2. Hepatic steatosis.
3. Chronic pancreatitis.
4. Aortic Atherosclerosis ([id number]).

## 2022-03-13 DIAGNOSIS — Z931 Gastrostomy status: Secondary | ICD-10-CM | POA: Diagnosis not present

## 2022-03-13 DIAGNOSIS — D62 Acute posthemorrhagic anemia: Secondary | ICD-10-CM | POA: Diagnosis not present

## 2022-03-13 DIAGNOSIS — E440 Moderate protein-calorie malnutrition: Secondary | ICD-10-CM | POA: Diagnosis not present

## 2022-03-13 DIAGNOSIS — K275 Chronic or unspecified peptic ulcer, site unspecified, with perforation: Secondary | ICD-10-CM | POA: Diagnosis not present

## 2022-03-22 ENCOUNTER — Telehealth: Payer: Self-pay | Admitting: Primary Care

## 2022-03-22 NOTE — Telephone Encounter (Signed)
Left message to return call to our office.  

## 2022-03-22 NOTE — Telephone Encounter (Signed)
Patient called in needing an appointment with Maria [name] regarding ongoing stomach issues due to ulcer surgery in March. She's has an appointment with GI doctor on December 5th. However she's in a lot of pain. Maria [name] will not have any appointments besides acute/same days until October.She would like to know is there any way she can be seen sooner?

## 2022-03-26 ENCOUNTER — Other Ambulatory Visit: Payer: Self-pay

## 2022-03-26 DIAGNOSIS — Z934 Other artificial openings of gastrointestinal tract status: Secondary | ICD-10-CM | POA: Diagnosis not present

## 2022-03-26 DIAGNOSIS — R001 Bradycardia, unspecified: Secondary | ICD-10-CM | POA: Diagnosis not present

## 2022-03-26 DIAGNOSIS — Z811 Family history of alcohol abuse and dependence: Secondary | ICD-10-CM | POA: Diagnosis not present

## 2022-03-26 DIAGNOSIS — Z20822 Contact with and (suspected) exposure to covid-19: Secondary | ICD-10-CM | POA: Diagnosis not present

## 2022-03-26 DIAGNOSIS — K3189 Other diseases of stomach and duodenum: Secondary | ICD-10-CM | POA: Diagnosis not present

## 2022-03-26 DIAGNOSIS — Z791 Long term (current) use of non-steroidal anti-inflammatories (NSAID): Secondary | ICD-10-CM | POA: Diagnosis not present

## 2022-03-26 DIAGNOSIS — F1010 Alcohol abuse, uncomplicated: Secondary | ICD-10-CM | POA: Diagnosis not present

## 2022-03-26 DIAGNOSIS — Z79899 Other long term (current) drug therapy: Secondary | ICD-10-CM | POA: Diagnosis not present

## 2022-03-26 DIAGNOSIS — K920 Hematemesis: Secondary | ICD-10-CM | POA: Diagnosis not present

## 2022-03-26 DIAGNOSIS — Z888 Allergy status to other drugs, medicaments and biological substances status: Secondary | ICD-10-CM

## 2022-03-26 DIAGNOSIS — F419 Anxiety disorder, unspecified: Secondary | ICD-10-CM | POA: Diagnosis present

## 2022-03-26 DIAGNOSIS — R109 Unspecified abdominal pain: Secondary | ICD-10-CM | POA: Diagnosis not present

## 2022-03-26 DIAGNOSIS — K861 Other chronic pancreatitis: Secondary | ICD-10-CM | POA: Diagnosis not present

## 2022-03-26 DIAGNOSIS — F32A Depression, unspecified: Secondary | ICD-10-CM | POA: Diagnosis present

## 2022-03-26 DIAGNOSIS — K2100 Gastro-esophageal reflux disease with esophagitis, without bleeding: Secondary | ICD-10-CM | POA: Diagnosis present

## 2022-03-26 DIAGNOSIS — Z8261 Family history of arthritis: Secondary | ICD-10-CM | POA: Diagnosis not present

## 2022-03-26 DIAGNOSIS — M1990 Unspecified osteoarthritis, unspecified site: Secondary | ICD-10-CM | POA: Diagnosis present

## 2022-03-26 DIAGNOSIS — F17210 Nicotine dependence, cigarettes, uncomplicated: Secondary | ICD-10-CM | POA: Diagnosis present

## 2022-03-26 DIAGNOSIS — I700 Atherosclerosis of aorta: Secondary | ICD-10-CM | POA: Diagnosis not present

## 2022-03-26 DIAGNOSIS — Z803 Family history of malignant neoplasm of breast: Secondary | ICD-10-CM

## 2022-03-26 DIAGNOSIS — K259 Gastric ulcer, unspecified as acute or chronic, without hemorrhage or perforation: Secondary | ICD-10-CM | POA: Diagnosis not present

## 2022-03-26 DIAGNOSIS — Z91030 Bee allergy status: Secondary | ICD-10-CM

## 2022-03-26 DIAGNOSIS — D696 Thrombocytopenia, unspecified: Secondary | ICD-10-CM | POA: Diagnosis not present

## 2022-03-26 DIAGNOSIS — I10 Essential (primary) hypertension: Secondary | ICD-10-CM | POA: Diagnosis present

## 2022-03-26 DIAGNOSIS — Z8249 Family history of ischemic heart disease and other diseases of the circulatory system: Secondary | ICD-10-CM | POA: Diagnosis not present

## 2022-03-26 DIAGNOSIS — R1013 Epigastric pain: Secondary | ICD-10-CM | POA: Diagnosis not present

## 2022-03-26 DIAGNOSIS — K254 Chronic or unspecified gastric ulcer with hemorrhage: Principal | ICD-10-CM | POA: Diagnosis present

## 2022-03-26 DIAGNOSIS — K2980 Duodenitis without bleeding: Secondary | ICD-10-CM | POA: Diagnosis not present

## 2022-03-26 DIAGNOSIS — K922 Gastrointestinal hemorrhage, unspecified: Secondary | ICD-10-CM | POA: Diagnosis not present

## 2022-03-26 DIAGNOSIS — K209 Esophagitis, unspecified without bleeding: Secondary | ICD-10-CM | POA: Diagnosis not present

## 2022-03-26 LAB — CBC
HCT: 37.8 % (ref 36.0–46.0)
Hemoglobin: 13 g/dL (ref 12.0–15.0)
MCH: 34.2 pg — ABNORMAL HIGH (ref 26.0–34.0)
MCHC: 34.4 g/dL (ref 30.0–36.0)
MCV: 99.5 fL (ref 80.0–100.0)
Platelets: 143 10*3/uL — ABNORMAL LOW (ref 150–400)
RBC: 3.8 MIL/uL — ABNORMAL LOW (ref 3.87–5.11)
RDW: 12.2 % (ref 11.5–15.5)
WBC: 8.9 10*3/uL (ref 4.0–10.5)
nRBC: 0 % (ref 0.0–0.2)

## 2022-03-26 LAB — URINALYSIS, ROUTINE W REFLEX MICROSCOPIC
Bilirubin Urine: NEGATIVE
Glucose, UA: NEGATIVE mg/dL
Ketones, ur: 5 mg/dL — AB
Leukocytes,Ua: NEGATIVE
Nitrite: NEGATIVE
Protein, ur: NEGATIVE mg/dL
Specific Gravity, Urine: 1.016 (ref 1.005–1.030)
pH: 6 (ref 5.0–8.0)

## 2022-03-26 LAB — COMPREHENSIVE METABOLIC PANEL
ALT: 10 U/L (ref 0–44)
AST: 20 U/L (ref 15–41)
Albumin: 4.8 g/dL (ref 3.5–5.0)
Alkaline Phosphatase: 108 U/L (ref 38–126)
Anion gap: 11 (ref 5–15)
BUN: 20 mg/dL (ref 6–20)
CO2: 25 mmol/L (ref 22–32)
Calcium: 10.2 mg/dL (ref 8.9–10.3)
Chloride: 102 mmol/L (ref 98–111)
Creatinine, Ser: 0.78 mg/dL (ref 0.44–1.00)
GFR, Estimated: >60 mL/min (ref >60)
Glucose, Bld: 130 mg/dL — ABNORMAL HIGH (ref 70–99)
Potassium: 3.6 mmol/L (ref 3.5–5.1)
Sodium: 138 mmol/L (ref 135–145)
Total Bilirubin: 1.4 mg/dL — ABNORMAL HIGH (ref 0.3–1.2)
Total Protein: 8.4 g/dL — ABNORMAL HIGH (ref 6.5–8.1)

## 2022-03-26 LAB — LIPASE, BLOOD: Lipase: 23 U/L (ref 11–51)

## 2022-03-26 NOTE — Telephone Encounter (Signed)
Appointment set up for Friday 03/30/22 at 12:20

## 2022-03-26 NOTE — ED Triage Notes (Signed)
Pt arrives with c/o ABD pain that started a few days ago. Per pt, she has an episode of vomiting and vomited a small dark clot. Pt has hx of gastric ulcers.

## 2022-03-27 ENCOUNTER — Encounter
Admission: EM | Disposition: A | Discharge status: Home / Self Care | Payer: Self-pay | Source: Home / Self Care | Attending: Family Medicine

## 2022-03-27 ENCOUNTER — Encounter: Payer: Self-pay | Admitting: Family Medicine

## 2022-03-27 ENCOUNTER — Inpatient Hospital Stay: Payer: BC Managed Care – PPO

## 2022-03-27 ENCOUNTER — Inpatient Hospital Stay: Payer: BC Managed Care – PPO | Admitting: Anesthesiology

## 2022-03-27 ENCOUNTER — Inpatient Hospital Stay
Admission: EM | Admit: 2022-03-27 | Discharge: 2022-03-28 | DRG: 378 | Disposition: A | Discharge status: Home / Self Care | Payer: BC Managed Care – PPO | Attending: Family Medicine | Admitting: Family Medicine

## 2022-03-27 ENCOUNTER — Emergency Department: Payer: BC Managed Care – PPO

## 2022-03-27 DIAGNOSIS — M1990 Unspecified osteoarthritis, unspecified site: Secondary | ICD-10-CM | POA: Diagnosis present

## 2022-03-27 DIAGNOSIS — Z91030 Bee allergy status: Secondary | ICD-10-CM | POA: Diagnosis not present

## 2022-03-27 DIAGNOSIS — F1090 Alcohol use, unspecified, uncomplicated: Secondary | ICD-10-CM | POA: Diagnosis present

## 2022-03-27 DIAGNOSIS — K254 Chronic or unspecified gastric ulcer with hemorrhage: Secondary | ICD-10-CM | POA: Diagnosis present

## 2022-03-27 DIAGNOSIS — K3189 Other diseases of stomach and duodenum: Secondary | ICD-10-CM | POA: Diagnosis not present

## 2022-03-27 DIAGNOSIS — Z803 Family history of malignant neoplasm of breast: Secondary | ICD-10-CM | POA: Diagnosis not present

## 2022-03-27 DIAGNOSIS — R1013 Epigastric pain: Secondary | ICD-10-CM | POA: Diagnosis not present

## 2022-03-27 DIAGNOSIS — I10 Essential (primary) hypertension: Secondary | ICD-10-CM | POA: Diagnosis present

## 2022-03-27 DIAGNOSIS — F419 Anxiety disorder, unspecified: Secondary | ICD-10-CM | POA: Diagnosis present

## 2022-03-27 DIAGNOSIS — K920 Hematemesis: Secondary | ICD-10-CM

## 2022-03-27 DIAGNOSIS — F17210 Nicotine dependence, cigarettes, uncomplicated: Secondary | ICD-10-CM | POA: Diagnosis present

## 2022-03-27 DIAGNOSIS — K2100 Gastro-esophageal reflux disease with esophagitis, without bleeding: Secondary | ICD-10-CM | POA: Diagnosis present

## 2022-03-27 DIAGNOSIS — Z811 Family history of alcohol abuse and dependence: Secondary | ICD-10-CM | POA: Diagnosis not present

## 2022-03-27 DIAGNOSIS — K922 Gastrointestinal hemorrhage, unspecified: Secondary | ICD-10-CM | POA: Diagnosis present

## 2022-03-27 DIAGNOSIS — F1010 Alcohol abuse, uncomplicated: Secondary | ICD-10-CM

## 2022-03-27 DIAGNOSIS — K921 Melena: Secondary | ICD-10-CM

## 2022-03-27 DIAGNOSIS — K279 Peptic ulcer, site unspecified, unspecified as acute or chronic, without hemorrhage or perforation: Secondary | ICD-10-CM | POA: Diagnosis present

## 2022-03-27 DIAGNOSIS — D696 Thrombocytopenia, unspecified: Secondary | ICD-10-CM | POA: Diagnosis present

## 2022-03-27 DIAGNOSIS — F32A Depression, unspecified: Secondary | ICD-10-CM | POA: Diagnosis present

## 2022-03-27 DIAGNOSIS — K259 Gastric ulcer, unspecified as acute or chronic, without hemorrhage or perforation: Secondary | ICD-10-CM | POA: Diagnosis not present

## 2022-03-27 DIAGNOSIS — Z20822 Contact with and (suspected) exposure to covid-19: Secondary | ICD-10-CM | POA: Diagnosis present

## 2022-03-27 DIAGNOSIS — R001 Bradycardia, unspecified: Secondary | ICD-10-CM | POA: Diagnosis present

## 2022-03-27 DIAGNOSIS — K209 Esophagitis, unspecified without bleeding: Secondary | ICD-10-CM | POA: Diagnosis not present

## 2022-03-27 DIAGNOSIS — Z791 Long term (current) use of non-steroidal anti-inflammatories (NSAID): Secondary | ICD-10-CM | POA: Diagnosis not present

## 2022-03-27 DIAGNOSIS — Z934 Other artificial openings of gastrointestinal tract status: Secondary | ICD-10-CM | POA: Diagnosis not present

## 2022-03-27 DIAGNOSIS — Z888 Allergy status to other drugs, medicaments and biological substances status: Secondary | ICD-10-CM | POA: Diagnosis not present

## 2022-03-27 DIAGNOSIS — Z8261 Family history of arthritis: Secondary | ICD-10-CM | POA: Diagnosis not present

## 2022-03-27 DIAGNOSIS — Z79899 Other long term (current) drug therapy: Secondary | ICD-10-CM | POA: Diagnosis not present

## 2022-03-27 DIAGNOSIS — Z8249 Family history of ischemic heart disease and other diseases of the circulatory system: Secondary | ICD-10-CM | POA: Diagnosis not present

## 2022-03-27 DIAGNOSIS — K861 Other chronic pancreatitis: Secondary | ICD-10-CM | POA: Diagnosis present

## 2022-03-27 HISTORY — PX: ESOPHAGOGASTRODUODENOSCOPY (EGD) WITH PROPOFOL: SHX5813

## 2022-03-27 LAB — COMPREHENSIVE METABOLIC PANEL
ALT: 8 U/L (ref 0–44)
AST: 16 U/L (ref 15–41)
Albumin: 3.7 g/dL (ref 3.5–5.0)
Alkaline Phosphatase: 83 U/L (ref 38–126)
Anion gap: 9 (ref 5–15)
BUN: 15 mg/dL (ref 6–20)
CO2: 25 mmol/L (ref 22–32)
Calcium: 9.2 mg/dL (ref 8.9–10.3)
Chloride: 105 mmol/L (ref 98–111)
Creatinine, Ser: 0.57 mg/dL (ref 0.44–1.00)
GFR, Estimated: >60 mL/min (ref >60)
Glucose, Bld: 103 mg/dL — ABNORMAL HIGH (ref 70–99)
Potassium: 3.1 mmol/L — ABNORMAL LOW (ref 3.5–5.1)
Sodium: 139 mmol/L (ref 135–145)
Total Bilirubin: 1.6 mg/dL — ABNORMAL HIGH (ref 0.3–1.2)
Total Protein: 6.7 g/dL (ref 6.5–8.1)

## 2022-03-27 LAB — CBC
HCT: 30.4 % — ABNORMAL LOW (ref 36.0–46.0)
Hemoglobin: 10.6 g/dL — ABNORMAL LOW (ref 12.0–15.0)
MCH: 34.5 pg — ABNORMAL HIGH (ref 26.0–34.0)
MCHC: 34.9 g/dL (ref 30.0–36.0)
MCV: 99 fL (ref 80.0–100.0)
Platelets: 107 10*3/uL — ABNORMAL LOW (ref 150–400)
RBC: 3.07 MIL/uL — ABNORMAL LOW (ref 3.87–5.11)
RDW: 12.1 % (ref 11.5–15.5)
WBC: 6.9 10*3/uL (ref 4.0–10.5)
nRBC: 0 % (ref 0.0–0.2)

## 2022-03-27 LAB — PROTIME-INR
INR: 1.1 (ref 0.8–1.2)
Prothrombin Time: 14.3 seconds (ref 11.4–15.2)

## 2022-03-27 LAB — TYPE AND SCREEN
ABO/RH(D): O POS
Antibody Screen: NEGATIVE

## 2022-03-27 LAB — SARS CORONAVIRUS 2 BY RT PCR: SARS Coronavirus 2 by RT PCR: NEGATIVE

## 2022-03-27 LAB — HIV ANTIBODY (ROUTINE TESTING W REFLEX): HIV Screen 4th Generation wRfx: NONREACTIVE

## 2022-03-27 SURGERY — EGD (ESOPHAGOGASTRODUODENOSCOPY)
Anesthesia: Monitor Anesthesia Care

## 2022-03-27 SURGERY — ESOPHAGOGASTRODUODENOSCOPY (EGD) WITH PROPOFOL
Anesthesia: General

## 2022-03-27 MED ORDER — LACTATED RINGERS IV BOLUS
1000.0000 mL | Freq: Once | INTRAVENOUS | Status: AC
  Administered 2022-03-27: 1000 mL via INTRAVENOUS

## 2022-03-27 MED ORDER — MAGNESIUM HYDROXIDE 400 MG/5ML PO SUSP: 30.0000 mL | Freq: Every day | ORAL | Status: DC | PRN

## 2022-03-27 MED ORDER — ADULT MULTIVITAMIN W/MINERALS CH
1.0000 | ORAL_TABLET | Freq: Every day | ORAL | Status: DC
  Administered 2022-03-27 – 2022-03-28 (×2): 1 via ORAL
  Filled 2022-03-27 (×2): qty 1

## 2022-03-27 MED ORDER — THIAMINE HCL 100 MG PO TABS
100.0000 mg | ORAL_TABLET | Freq: Every day | ORAL | Status: DC
  Administered 2022-03-27 – 2022-03-28 (×2): 100 mg via ORAL
  Filled 2022-03-27 (×4): qty 1

## 2022-03-27 MED ORDER — LIDOCAINE HCL (CARDIAC) PF 100 MG/5ML IV SOSY
PREFILLED_SYRINGE | INTRAVENOUS | Status: DC | PRN
  Administered 2022-03-27: 50 mg via INTRAVENOUS

## 2022-03-27 MED ORDER — LORAZEPAM 2 MG/ML IJ SOLN: 0.0000 mg | Freq: Four times a day (QID) | INTRAMUSCULAR | Status: DC

## 2022-03-27 MED ORDER — TRAZODONE HCL 50 MG PO TABS: 25.0000 mg | ORAL_TABLET | Freq: Every evening | ORAL | Status: DC | PRN

## 2022-03-27 MED ORDER — IOHEXOL 300 MG/ML  SOLN
100.0000 mL | Freq: Once | INTRAMUSCULAR | Status: AC | PRN
  Administered 2022-03-27: 100 mL via INTRAVENOUS

## 2022-03-27 MED ORDER — SODIUM CHLORIDE 0.9 % IV SOLN: INTRAVENOUS | Status: DC

## 2022-03-27 MED ORDER — PANTOPRAZOLE INFUSION (NEW) - SIMPLE MED
8.0000 mg/h | INTRAVENOUS | Status: AC
  Administered 2022-03-27: 8 mg/h via INTRAVENOUS
  Filled 2022-03-27 (×2): qty 100

## 2022-03-27 MED ORDER — POTASSIUM CHLORIDE CRYS ER 20 MEQ PO TBCR
40.0000 meq | EXTENDED_RELEASE_TABLET | Freq: Once | ORAL | Status: AC
  Administered 2022-03-27: 40 meq via ORAL
  Filled 2022-03-27: qty 2

## 2022-03-27 MED ORDER — PANTOPRAZOLE 80MG IVPB - SIMPLE MED
80.0000 mg | Freq: Once | INTRAVENOUS | Status: AC
  Administered 2022-03-27: 80 mg via INTRAVENOUS
  Filled 2022-03-27: qty 100

## 2022-03-27 MED ORDER — OCTREOTIDE LOAD VIA INFUSION
50.0000 ug | Freq: Once | INTRAVENOUS | Status: AC
  Administered 2022-03-27: 50 ug via INTRAVENOUS
  Filled 2022-03-27: qty 25

## 2022-03-27 MED ORDER — ONDANSETRON HCL 4 MG PO TABS: 4.0000 mg | ORAL_TABLET | Freq: Four times a day (QID) | ORAL | Status: DC | PRN

## 2022-03-27 MED ORDER — EPINEPHRINE 0.3 MG/0.3ML IJ SOAJ: 0.3000 mg | INTRAMUSCULAR | Status: DC | PRN

## 2022-03-27 MED ORDER — PROPOFOL 1000 MG/100ML IV EMUL
INTRAVENOUS | Status: AC
  Filled 2022-03-27: qty 100

## 2022-03-27 MED ORDER — ONDANSETRON HCL 4 MG/2ML IJ SOLN: 4.0000 mg | Freq: Four times a day (QID) | INTRAMUSCULAR | Status: DC | PRN

## 2022-03-27 MED ORDER — PANTOPRAZOLE SODIUM 40 MG IV SOLR
40.0000 mg | Freq: Two times a day (BID) | INTRAVENOUS | Status: DC
  Administered 2022-03-28: 40 mg via INTRAVENOUS
  Filled 2022-03-27 (×2): qty 10

## 2022-03-27 MED ORDER — ACETAMINOPHEN 325 MG PO TABS: 650.0000 mg | ORAL_TABLET | Freq: Four times a day (QID) | ORAL | Status: DC | PRN

## 2022-03-27 MED ORDER — POTASSIUM CHLORIDE IN NACL 20-0.9 MEQ/L-% IV SOLN
INTRAVENOUS | Status: DC
  Filled 2022-03-27 (×4): qty 1000

## 2022-03-27 MED ORDER — SODIUM CHLORIDE 0.9 % IV SOLN
50.0000 ug/h | INTRAVENOUS | Status: DC
  Administered 2022-03-27: 50 ug/h via INTRAVENOUS
  Filled 2022-03-27 (×2): qty 1

## 2022-03-27 MED ORDER — ACETAMINOPHEN 650 MG RE SUPP: 650.0000 mg | Freq: Four times a day (QID) | RECTAL | Status: DC | PRN

## 2022-03-27 MED ORDER — THIAMINE HCL 100 MG/ML IJ SOLN: Freq: Once | INTRAVENOUS | Status: DC

## 2022-03-27 MED ORDER — FOLIC ACID 1 MG PO TABS
1.0000 mg | ORAL_TABLET | Freq: Every day | ORAL | Status: DC
  Administered 2022-03-27 – 2022-03-28 (×2): 1 mg via ORAL
  Filled 2022-03-27 (×2): qty 1

## 2022-03-27 MED ORDER — ONDANSETRON HCL 4 MG PO TABS: 4.0000 mg | ORAL_TABLET | Freq: Every day | ORAL | Status: DC | PRN

## 2022-03-27 MED ORDER — MORPHINE SULFATE (PF) 2 MG/ML IV SOLN
2.0000 mg | INTRAVENOUS | Status: DC | PRN
  Administered 2022-03-27: 2 mg via INTRAVENOUS
  Filled 2022-03-27: qty 1

## 2022-03-27 MED ORDER — ONDANSETRON HCL 4 MG/2ML IJ SOLN
4.0000 mg | Freq: Once | INTRAMUSCULAR | Status: AC
Start: 2022-03-27 — End: 2022-03-27
  Administered 2022-03-27: 4 mg via INTRAVENOUS
  Filled 2022-03-27: qty 2

## 2022-03-27 MED ORDER — PROPOFOL 500 MG/50ML IV EMUL
INTRAVENOUS | Status: DC | PRN
  Administered 2022-03-27: 150 ug/kg/min via INTRAVENOUS

## 2022-03-27 MED ORDER — LIDOCAINE HCL (PF) 2 % IJ SOLN
INTRAMUSCULAR | Status: AC
  Filled 2022-03-27: qty 5

## 2022-03-27 NOTE — ED Provider Notes (Signed)
[facility] Provider Note    Event Date/Time   First MD Initiated Contact with Patient 03/27/22 0007     (approximate)   History   Abdominal Pain   HPI  Maria [name] is a 51 y.o. female who returns to the ED from home with persistent upper abdominal pain, now with 1 episode of vomiting blood clot.  History of perforated ulcer in March 2023 requiring exploratory laparotomy.  She has had multiple ED visits this summer for same, having difficulty getting into see GI.  She is a drinker of alcohol who has tried to cut back since her hospitalization in the spring.  She also takes meloxicam.  Denies chest pain, shortness of breath, dysuria, diarrhea or dizziness.  Denies anticoagulant use.     Past Medical History   Past Medical History:  Diagnosis Date  . Acute postoperative anemia due to expected blood loss 09/19/2021  . Anemia   . Anesthesia complication, initial encounter 09/13/2021   Difficulty waking up from Anesthesia requiring re-intubation  . Anxiety    h/o  . Depression    h/o  . Family history of adverse reaction to anesthesia    mom-hard time waking up  . Folate deficiency 07/30/2019  . Gastritis 08/19/2020  . GERD (gastroesophageal reflux disease)    tums prn  . Hypertension    WAS PUT ON BP MED BY PCP LAST YEAR (2018) AND BP MED MADE PT SICK SO SHE STOPPED TAKING IT-PCP MONITORS BP NOW  . Palpitations 05/18/2020  . Pancreatitis   . Perforated peptic ulcer ([place]) 09/14/2021  . Poison ivy dermatitis 11/13/2019  . Vitamin B12 deficiency 01/17/2018   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     Active Problem List   Patient Active Problem List   Diagnosis Date Noted  . Vaginal itching 02/08/2022  . Muscle strain 01/09/2022  . Ankle edema, bilateral 11/15/2021  . Chronic anemia 11/02/2021  . Hypernatremia 09/19/2021  . AKI (acute kidney injury) ([place]) 09/18/2021  . Alcohol abuse 09/18/2021  . Malnutrition of moderate degree 09/15/2021  .  PUD (peptic ulcer disease) 09/14/2021  . Chronic pancreatitis ([place]) 09/13/2021  . Heartburn 09/06/2021  . Bilious vomiting with nausea 09/06/2021  . Back pain 07/23/2021  . Vitamin B1 deficiency 05/29/2021  . Macrocytosis 05/17/2021  . Skin lump of arm, right 05/17/2021  . Thrombocytopenia ([place]) 02/05/2021  . History of seasonal allergies 05/18/2020  . Fatigue 12/09/2019  . Depression, recurrent ([place]) 11/13/2019  . Vitamin D deficiency 07/30/2019  . Hyperglycemia 07/30/2019  . Encounter for annual general medical examination with abnormal findings in adult 07/30/2019  . Bee sting allergy 11/11/2018  . Uterine prolapse 02/28/2018  . Right carpal tunnel syndrome 11/13/2017  . Essential hypertension 12/24/2016  . Peripheral neuropathy 12/24/2016     Past Surgical History   Past Surgical History:  Procedure Laterality Date  . ANTERIOR AND POSTERIOR REPAIR WITH SACROSPINOUS FIXATION N/A 03/13/2018   Procedure: ANTERIOR REPAIR;  Surgeon: [name], MD;  Location: [facility] ORS;  Service: Gynecology;  Laterality: N/A;  . INDUCED ABORTION     x2  . IR CM INJ ANY COLONIC TUBE W/FLUORO  09/29/2021  . LAPAROTOMY N/A 09/13/2021   Procedure: EXPLORATORY LAPAROTOMY-Graham PATCH repair;  Surgeon: [name], DO;  Location: [facility] ORS;  Service: General;  Laterality: N/A;  . LAPAROTOMY N/A 09/17/2021   Procedure: EXPLORATORY LAPAROTOMY;  Surgeon: [name], MD;  Location: [facility] ORS;  Service: General;  Laterality: N/A;  .  REPAIR OF PERFORATED ULCER N/A 09/17/2021   Procedure: REPAIR OF PERFORATED ULCER;  Surgeon: [name], MD;  Location: [facility] ORS;  Service: General;  Laterality: N/A;  . VAGINAL HYSTERECTOMY N/A 03/13/2018   Procedure: HYSTERECTOMY VAGINAL;  Surgeon: [name], MD;  Location: [facility] ORS;  Service: Gynecology;  Laterality: N/A;     Home Medications   Prior to Admission medications   Medication Sig Start Date End Date Taking? Authorizing Provider  cyclobenzaprine  (FLEXERIL) 5 MG tablet Take 5 mg by mouth 3 (three) times daily as needed for muscle spasms.    [provider]  EPINEPHrine 0.3 mg/0.3 mL IJ SOAJ injection Inject 0.3 mLs (0.3 mg total) into the muscle as needed for anaphylaxis. 11/11/18   [name], NP  pantoprazole (PROTONIX) 40 MG tablet Take 1 tablet (40 mg total) by mouth daily. 01/19/22 02/18/22  [name], MD  sucralfate (CARAFATE) 1 g tablet Take 1 tablet (1 g total) by mouth 4 (four) times daily for 5 days. 01/19/22 02/08/22  [name], MD     Allergies  Anesthesia s-i-40 [propofol], Bee venom, and Bupropion   Family History   Family History  Problem Relation Age of Onset  . Arthritis Mother   . Arthritis Father   . Alcohol abuse Father   . Hypertension Father   . Heart disease Father   . Aneurysm Father   . Breast cancer Maternal Grandmother   . Breast cancer Paternal Grandmother      Physical Exam  Triage Vital Signs: ED Triage Vitals  Enc Vitals Group     BP 03/26/22 1815 (!) 172/82     Pulse Rate 03/26/22 1815 (!) 59     Resp 03/26/22 1815 18     Temp 03/26/22 1815 98.2 F (36.8 C)     Temp Source 03/26/22 1815 Oral     SpO2 03/26/22 1815 98 %     Weight 03/26/22 1816 108 lb (49 kg)     Height 03/26/22 1816 '5\' 1"'$  (1.549 m)     Head Circumference --      Peak Flow --      Pain Score 03/26/22 1815 8     Pain Loc --      Pain Edu? --      Excl. in [place]? --     Updated Vital Signs: BP (!) 172/82   Pulse (!) 59   Temp 98.2 F (36.8 C) (Oral)   Resp 18   Ht '5\' 1"'$  (1.549 m)   Wt 49 kg   LMP 02/24/2018 (Approximate) Comment: spotting   SpO2 98%   BMI 20.41 kg/m    General: Awake, mild distress.  CV:  RRR.  Good peripheral perfusion.  Resp:  Normal effort.  CTA B. Abd:  Mild epigastric tenderness to palpation without rebound or guarding.  No distention.  Other:  No truncal vesicles.   ED Results / Procedures / Treatments  Labs (all labs ordered are listed, but only  abnormal results are displayed) Labs Reviewed  COMPREHENSIVE METABOLIC PANEL - Abnormal; Notable for the following components:      Result Value   Glucose, Bld 130 (*)    Total Protein 8.4 (*)    Total Bilirubin 1.4 (*)    All other components within normal limits  CBC - Abnormal; Notable for the following components:   RBC 3.80 (*)    MCH 34.2 (*)    Platelets 143 (*)    All other  components within normal limits  URINALYSIS, ROUTINE W REFLEX MICROSCOPIC - Abnormal; Notable for the following components:   Color, Urine YELLOW (*)    APPearance CLEAR (*)    Hgb urine dipstick SMALL (*)    Ketones, ur 5 (*)    Bacteria, UA RARE (*)    All other components within normal limits  SARS CORONAVIRUS 2 BY RT PCR  LIPASE, BLOOD  PROTIME-INR  TYPE AND SCREEN     EKG  ED ECG REPORT I, Shawonda Kerce J, the attending physician, personally viewed and interpreted this ECG.   Date: 03/27/2022  EKG Time: 1832  Rate: 55  Rhythm: sinus bradycardia  Axis: Normal  Intervals:none  ST&T Change: Nonspecific    RADIOLOGY I have dependently visualized and interpreted patient's chest x-ray as well as noted the radiology interpretation:  X-ray:  Official radiology report(s): No results found.   PROCEDURES:  Critical Care performed: Yes, see critical care procedure note(s)  CRITICAL CARE Performed by: Paulette Blanch   Total critical care time: 30 minutes  Critical care time was exclusive of separately billable procedures and treating other patients.  Critical care was necessary to treat or prevent imminent or life-threatening deterioration.  Critical care was time spent personally by me on the following activities: development of treatment plan with patient and/or surrogate as well as nursing, discussions with consultants, evaluation of patient's response to treatment, examination of patient, obtaining history from patient or surrogate, ordering and performing treatments and interventions,  ordering and review of laboratory studies, ordering and review of radiographic studies, pulse oximetry and re-evaluation of patient's condition.   Marland Kitchen1-3 Lead EKG Interpretation  Performed by: Paulette Blanch, MD Authorized by: Paulette Blanch, MD     Interpretation: abnormal     ECG rate:  58   ECG rate assessment: bradycardic     Rhythm: sinus bradycardia     Ectopy: none     Conduction: normal   Comments:     Patient placed on cardiac monitor to evaluate for arrhythmias   Rectal exam: External exam within normal limits.  Dark stool obtained on gloved finger which is heme positive.   MEDICATIONS ORDERED IN ED: Medications  pantoprazole (PROTONIX) 80 mg /NS 100 mL IVPB (has no administration in time range)  pantoprozole (PROTONIX) 80 mg /NS 100 mL infusion (has no administration in time range)  ondansetron (ZOFRAN) injection 4 mg (has no administration in time range)  lactated ringers bolus 1,000 mL (has no administration in time range)  sodium chloride 0.9 % 1,000 mL with thiamine 620 mg, folic acid 1 mg, M.V.I. Adult 10 mL infusion (has no administration in time range)  thiamine (VITAMIN B1) tablet 100 mg (has no administration in time range)  LORazepam (ATIVAN) injection 0-4 mg (has no administration in time range)     IMPRESSION / MDM / ASSESSMENT AND PLAN / ED COURSE  I reviewed the triage vital signs and the nursing notes.                             51 year old female presenting with epigastric pain and hematemesis; history of perforated ulcer.  I have personally reviewed patient's records and note her hospitalization from March 2023 for perforated ulcer requiring exploratory laparotomy.  I have also noted her PCP office visit from 11/02/2021 for follow-up perforated peptic ulcer.  Patient's presentation is most consistent with acute presentation with potential threat to life or bodily function.  The  patient is on the cardiac monitor to evaluate for evidence of arrhythmia and/or  significant heart rate changes.  Laboratory results demonstrate stable H/H 13/37, T. bili mildly elevated at 1.4 with normal lipase, mild ketonuria.  Patient is currently hemodynamically stable.  Will initiate IV fluid resuscitation, IV Protonix bolus with drip, placed on CIWA protocol.  Will start hospital services for evaluation and admission.      FINAL CLINICAL IMPRESSION(S) / ED DIAGNOSES   Final diagnoses:  Epigastric pain  PUD (peptic ulcer disease)  Hematemesis with nausea  Melena     Rx / DC Orders   ED Discharge Orders     None        Note:  This document was prepared using Dragon voice recognition software and may include unintentional dictation errors.   Paulette Blanch, MD 03/27/22 [phone]

## 2022-03-27 NOTE — H&P (Signed)
[place]   PATIENT NAME: Maria [name]    MR#:  [id number]  DATE OF BIRTH:  [birth year]  DATE OF ADMISSION:  03/27/2022  PRIMARY CARE PHYSICIAN: [name], NP   Patient is coming from: Home  REQUESTING/REFERRING PHYSICIAN: Lurline Hare, MD CHIEF COMPLAINT:   Chief Complaint  Patient presents with   Abdominal Pain    HISTORY OF PRESENT ILLNESS:  Maria [name] is a 51 y.o. Caucasian female with medical history significant for anxiety, depression, hypertension, GERD, perforated peptic ulcer status post repair, who presented to the emergency room with acute onset of epigastric abdominal pain with associated vomiting with blood clot.  Patient had a perforated ulcer status post repair in March 2023.  She tried to cut back on her alcohol intake since her hospitalization in March.  She takes meloxicam for osteoarthritis.  Her last alcoholic drink was yesterday.  She usually drinks 1-2 mixed drinks per day.  No nausea or vomiting or fever or chills.  No chest pain or palpitations.  No reported melena or bright red bleeding per rectum.  No dyspnea or cough or wheezing or hemoptysis.  No dysuria, oliguria or hematuria or flank pain.  No other bleeding diathesis.  ED Course: When the patient came to the ER, BP was 164/72 with a heart respiratory rate of 25 with otherwise normal vital signs.  Labs revealed unremarkable CMP and CBC showed mild thrombocytopenia.  INR is within 1.1 and PT 14.3.  COVID-19 PCR is ending.  UA came back negative. EKG as reviewed by me : EKG showed sinus bradycardia with rate of 55 with T wave inversion anteroseptally and laterally. Imaging: Portable chest x-ray showed no acute cardiopulmonary disease.  The patient was given 1 L bolus of IV lactated Ringer, 40 mg of IV Zofran and 80 mg of IV Protonix bolus followed by IV Protonix drip.  She will be admitted to a progressive unit bed for further evaluation and management. PAST MEDICAL HISTORY:   Past  Medical History:  Diagnosis Date   Acute postoperative anemia due to expected blood loss 09/19/2021   Anemia    Anesthesia complication, initial encounter 09/13/2021   Difficulty waking up from Anesthesia requiring re-intubation   Anxiety    h/o   Depression    h/o   Family history of adverse reaction to anesthesia    mom-hard time waking up   Folate deficiency 07/30/2019   Gastritis 08/19/2020   GERD (gastroesophageal reflux disease)    tums prn   Hypertension    WAS PUT ON BP MED BY PCP LAST YEAR (2018) AND BP MED MADE PT SICK SO SHE STOPPED TAKING IT-PCP MONITORS BP NOW   Palpitations 05/18/2020   Pancreatitis    Perforated peptic ulcer ([place]) 09/14/2021   Poison ivy dermatitis 11/13/2019   Vitamin B12 deficiency 01/17/2018   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    PAST SURGICAL HISTORY:   Past Surgical History:  Procedure Laterality Date   ANTERIOR AND POSTERIOR REPAIR WITH SACROSPINOUS FIXATION N/A 03/13/2018   Procedure: ANTERIOR REPAIR;  Surgeon: [name], MD;  Location: [facility] ORS;  Service: Gynecology;  Laterality: N/A;   INDUCED ABORTION     x2   IR CM INJ ANY COLONIC TUBE W/FLUORO  09/29/2021   LAPAROTOMY N/A 09/13/2021   Procedure: EXPLORATORY LAPAROTOMY-Graham PATCH repair;  Surgeon: [name], DO;  Location: [facility] ORS;  Service: General;  Laterality: N/A;   LAPAROTOMY N/A 09/17/2021   Procedure: EXPLORATORY  LAPAROTOMY;  Surgeon: [name], MD;  Location: [facility] OR[facility];  Service: General;  Laterality: N/A;   REPAIR OF PERFORATED ULCER N/A 09/17/2021   Procedure: REPAIR OF PERFORATED ULCER;  Surgeon: [name], MD;  Location: [facility];  Service: General;  Laterality: N/A;   VAGINAL HYSTERECTOMY N/A 03/13/2018   Procedure: HYSTERECTOMY VAGINAL;  Surgeon: [name], MD;  Location: [facility] OR[facility];  Service: Gynecology;  Laterality: N/A;    SOCIAL HISTORY:   Social History   Tobacco Use   Smoking status: Every Day    Packs/day: 1.00    Years: 20.00     Total pack years: 20.00    Types: Cigarettes   Smokeless tobacco: Never  Substance Use Topics   Alcohol use: Yes    Alcohol/week: 2.0 standard drinks of alcohol    Types: 2 Glasses of wine per week    Comment: two glasses wine/beer every other day    FAMILY HISTORY:   Family History  Problem Relation Age of Onset   Arthritis Mother    Arthritis Father    Alcohol abuse Father    Hypertension Father    Heart disease Father    Aneurysm Father    Breast cancer Maternal Grandmother    Breast cancer Paternal Grandmother     DRUG ALLERGIES:   Allergies  Allergen Reactions   Anesthesia S-I-40 [Propofol]    Bee Venom Swelling   Bupropion Anxiety and Other (See Comments)    Worsened anxiety    REVIEW OF SYSTEMS:   ROS As per history of present illness. All pertinent systems were reviewed above. Constitutional, HEENT, cardiovascular, respiratory, GI, GU, musculoskeletal, neuro, psychiatric, endocrine, integumentary and hematologic systems were reviewed and are otherwise negative/unremarkable except for positive findings mentioned above in the HPI.   MEDICATIONS AT HOME:   Prior to Admission medications   Medication Sig Start Date End Date Taking? Authorizing Provider  meloxicam (MOBIC) 15 MG tablet Take 15 mg by mouth daily. 03/16/22  Yes [provider]  pantoprazole (PROTONIX) 40 MG tablet Take 1 tablet (40 mg total) by mouth daily. 01/19/22 03/27/22 Yes [name], MD  EPINEPHrine 0.3 mg/0.3 mL IJ SOAJ injection Inject 0.3 mLs (0.3 mg total) into the muscle as needed for anaphylaxis. 11/11/18   [name], NP  ondansetron (ZOFRAN) 4 MG tablet Take 4 mg by mouth daily as needed. 03/17/22   [provider]      VITAL SIGNS:  Blood pressure (!) 164/72, pulse 65, temperature 98.2 F (36.8 C), temperature source Oral, resp. rate 18, height '5\' 1"'$  (1.549 m), weight 49 kg, last menstrual period 02/24/2018, SpO2 98 %.  PHYSICAL EXAMINATION:  Physical  Exam  GENERAL:  51 y.o.-year-old Caucasian female patient lying in the bed with no acute distress.  EYES: Pupils equal, round, reactive to light and accommodation. No scleral icterus. Extraocular muscles intact.  HEENT: Head atraumatic, normocephalic. Oropharynx and nasopharynx clear.  NECK:  Supple, no jugular venous distention. No thyroid enlargement, no tenderness.  LUNGS: Normal breath sounds bilaterally, no wheezing, rales,rhonchi or crepitation. No use of accessory muscles of respiration.  CARDIOVASCULAR: Regular rate and rhythm, S1, S2 normal. No murmurs, rubs, or gallops.  ABDOMEN: Soft, nondistended, nontender. Bowel sounds present. No organomegaly or mass.  EXTREMITIES: No pedal edema, cyanosis, or clubbing.  NEUROLOGIC: Cranial nerves II through XII are intact. Muscle strength 5/5 in all extremities. Sensation intact. Gait not checked.  PSYCHIATRIC: The patient is alert and oriented x 3.  Normal affect  and good eye contact. SKIN: No obvious rash, lesion, or ulcer.   LABORATORY PANEL:   CBC Recent Labs  Lab 03/26/22 1819  WBC 8.9  HGB 13.0  HCT 37.8  PLT 143*   ------------------------------------------------------------------------------------------------------------------  Chemistries  Recent Labs  Lab 03/26/22 1819  NA 138  K 3.6  CL 102  CO2 25  GLUCOSE 130*  BUN 20  CREATININE 0.78  CALCIUM 10.2  AST 20  ALT 10  ALKPHOS 108  BILITOT 1.4*   ------------------------------------------------------------------------------------------------------------------  Cardiac Enzymes No results for input(s): "TROPONINI" in the last 168 hours. ------------------------------------------------------------------------------------------------------------------  RADIOLOGY:  DG Chest Port 1 View  Result Date: 03/27/2022 CLINICAL DATA:  Abdominal pain, vomiting EXAM: PORTABLE CHEST 1 VIEW COMPARISON:  09/14/2021 FINDINGS: Heart and mediastinal contours are within normal  limits. No focal opacities or effusions. No acute bony abnormality. IMPRESSION: No active disease. Electronically Signed   By: Rolm Baptise M.D.   On: 03/27/2022 00:41      IMPRESSION AND PLAN:  Assessment and Plan: * Upper GI bleeding - The patient be admitted to a progressive unit bed. - Differential diagnoses include acute gastritis/duodenitis, gastric or duodenal ulcer or erosion secondary to alcohol abuse as well as NSAIDs. - We will continue her on IV Protonix drip. - We will follow serial H&H.  PUD (peptic ulcer disease) - The patient will be on PPI therapy as mentioned above.  Essential hypertension - The patient will be placed on as needed IV hydralazine.  Alcohol abuse - The patient will be placed on CIWA protocol.   DVT prophylaxis: SCDs. Advanced Care Planning:  Code Status: full code. Family Communication:  The plan of care was discussed in details with the patient (and family). I answered all questions. The patient agreed to proceed with the above mentioned plan. Further management will depend upon hospital course. Disposition Plan: Back to previous home environment Consults called: none.  All the records are reviewed and case discussed with ED provider.  Status is: Inpatient   At the time of the admission, it appears that the appropriate admission status for this patient is inpatient.  This is judged to be reasonable and necessary in order to provide the required intensity of service to ensure the patient's safety given the presenting symptoms, physical exam findings and initial radiographic and laboratory data in the context of comorbid conditions.  The patient requires inpatient status due to high intensity of service, high risk of further deterioration and high frequency of surveillance required.  I certify that at the time of admission, it is my clinical judgment that the patient will require inpatient hospital care extending more than 2 midnights.                             Dispo: The patient is from: Home              Anticipated d/c is to: Home              Patient currently is not medically stable to d/c.              Difficult to place patient: No  Christel Mormon M.D on 03/27/2022 at 4:55 AM  [facility]   From 7 PM-7 AM, contact night-coverage [email or web address]  CC: Primary care physician; [name], NP

## 2022-03-27 NOTE — Assessment & Plan Note (Signed)
-   The patient will be placed on as needed IV hydralazine.

## 2022-03-27 NOTE — Consult Note (Addendum)
Consultation  Referring Provider:     Dr Sidney Ace Admit date 03/27/22 Consult date        03/27/22 Reason for Consultation:   epigastric pain/hematemesis           HPI:   Maria [name] is a 51 y.o. female  with medical history significant for anxiety, depression, hypertension, GERD, perforated peptic h pylori related ulcer status post repair3/23 (Dr Lysle Pearl- treated with Pylera ),who presented to the ED with acute onset epigastric pain/vomting with hematemesis. Does endorse etoh and NSAIDs- last etoh yesterday- endorses daily etoh- 2-3 drinks/d- last was yesterday. Had been taking meloxicam prn on and off but daily/mulitple times a day for her abdominal pain. States she finished the Pylera in April. Unsure if she has been taking the pantoprazole on her med list. States she has had some recuring/ongoing epigastric pain this year- was doing ok until last week- had some mild nausea with pink emesis a couple of times last week and then last night after dinner had sudden onset of bad nausea and maroon vomiting with a dark clotty looking thing in it.  Denies any melena/hematochezia and further GI concerns. Never had any history of egd/colonoscopy. Npo since last night She has been started on IV Pantoprazole, given antiementics and IVF. Note her hgb has dropped form 14 last August to 10.6 today.she has some thrombocytopenia/ Bili slightly elevated, rest of liver enzymes normal. Pt/inr normal. GFR normal.  CT A/P 01/19/22 done during ED visit for epigastric pain: IMPRESSION: 1. Ulcer along the distal anterior body of the stomach has increased in size since the prior study. Surrounding gastric wall thickening has worsened. No perforation. 2. Unchanged trace perihepatic fluid along the right liver, at least some of which appears to be subcapsular. 3. Sequela of chronic pancreatitis. 4. Aortic Atherosclerosis (ICD10-I70.0).  PREVIOUS ENDOSCOPIES:            Ex-lap with primary closure of perforated peptic  ulcer with Phillip Heal patch/intra-abdmoinal abscess drainage/gastrostomy & jejunostomy tube insertion 09/17/21- Dr Lysle Pearl   Past Medical History:  Diagnosis Date   Acute postoperative anemia due to expected blood loss 09/19/2021   Anemia    Anesthesia complication, initial encounter 09/13/2021   Difficulty waking up from Anesthesia requiring re-intubation   Anxiety    h/o   Depression    h/o   Family history of adverse reaction to anesthesia    mom-hard time waking up   Folate deficiency 07/30/2019   Gastritis 08/19/2020   GERD (gastroesophageal reflux disease)    tums prn   Hypertension    WAS PUT ON BP MED BY PCP LAST YEAR (2018) AND BP MED MADE PT SICK SO SHE STOPPED TAKING IT-PCP MONITORS BP NOW   Palpitations 05/18/2020   Pancreatitis    Perforated peptic ulcer ([place]) 09/14/2021   Poison ivy dermatitis 11/13/2019   Vitamin B12 deficiency 01/17/2018   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    Past Surgical History:  Procedure Laterality Date   ANTERIOR AND POSTERIOR REPAIR WITH SACROSPINOUS FIXATION N/A 03/13/2018   Procedure: ANTERIOR REPAIR;  Surgeon: [name], MD;  Location: [facility];  Service: Gynecology;  Laterality: N/A;   INDUCED ABORTION     x2   IR CM INJ ANY COLONIC TUBE W/FLUORO  09/29/2021   LAPAROTOMY N/A 09/13/2021   Procedure: EXPLORATORY LAPAROTOMY-[place] PATCH repair;  Surgeon: [name], DO;  Location: [facility];  Service: General;  Laterality: N/A;   LAPAROTOMY N/A 09/17/2021   Procedure: EXPLORATORY LAPAROTOMY;  Surgeon: [name], MD;  Location: [facility];  Service: General;  Laterality: N/A;   REPAIR OF PERFORATED ULCER N/A 09/17/2021   Procedure: REPAIR OF PERFORATED ULCER;  Surgeon: [name], MD;  Location: [facility];  Service: General;  Laterality: N/A;   VAGINAL HYSTERECTOMY N/A 03/13/2018   Procedure: HYSTERECTOMY VAGINAL;  Surgeon: [name], MD;  Location: [facility];  Service: Gynecology;  Laterality: N/A;    Family History  Problem  Relation Age of Onset   Arthritis Mother    Arthritis Father    Alcohol abuse Father    Hypertension Father    Heart disease Father    Aneurysm Father    Breast cancer Maternal Grandmother    Breast cancer Paternal Grandmother      Social History   Tobacco Use   Smoking status: Every Day    Packs/day: 1.00    Years: 20.00    Total pack years: 20.00    Types: Cigarettes   Smokeless tobacco: Never  Vaping Use   Vaping Use: Never used  Substance Use Topics   Alcohol use: Yes    Alcohol/week: 2.0 standard drinks of alcohol    Types: 2 Glasses of wine per week    Comment: two glasses wine/beer every other day   Drug use: No    Prior to Admission medications   Medication Sig Start Date End Date Taking? Authorizing Provider  meloxicam (MOBIC) 15 MG tablet Take 15 mg by mouth daily. 03/16/22  Yes [provider]  pantoprazole (PROTONIX) 40 MG tablet Take 1 tablet (40 mg total) by mouth daily. 01/19/22 03/27/22 Yes [name], MD  EPINEPHrine 0.3 mg/0.3 mL IJ SOAJ injection Inject 0.3 mLs (0.3 mg total) into the muscle as needed for anaphylaxis. 11/11/18   [name], NP  ondansetron (ZOFRAN) 4 MG tablet Take 4 mg by mouth daily as needed. 03/17/22   [provider]    Current Facility-Administered Medications  Medication Dose Route Frequency Provider Last Rate Last Admin   0.9 % NaCl with KCl 20 mEq/ L  infusion   Intravenous Continuous Mansy, Jan A, MD 100 mL/hr at 03/27/22 0520 New Bag at 03/27/22 0520   acetaminophen (TYLENOL) tablet 650 mg  650 mg Oral Q6H PRN Mansy, Jan A, MD       Or   acetaminophen (TYLENOL) suppository 650 mg  650 mg Rectal Q6H PRN Mansy, Jan A, MD       EPINEPHrine (EPI-PEN) injection 0.3 mg  0.3 mg Intramuscular PRN Mansy, Jan A, MD       folic acid (FOLVITE) tablet 1 mg  1 mg Oral Daily Fritzi Mandes, MD       LORazepam (ATIVAN) injection 0-4 mg  0-4 mg Intravenous Q6H Paulette Blanch, MD       magnesium hydroxide (MILK OF  MAGNESIA) suspension 30 mL  30 mL Oral Daily PRN Mansy, Jan A, MD       morphine (PF) 2 MG/ML injection 2 mg  2 mg Intravenous Q4H PRN Mansy, Jan A, MD   2 mg at 03/27/22 0204   multivitamin with minerals tablet 1 tablet  1 tablet Oral Daily Fritzi Mandes, MD       octreotide (SANDOSTATIN) 500 mcg in sodium chloride 0.9 % 250 mL (2 mcg/mL) infusion  50 mcg/hr Intravenous Continuous Mansy, Jan A, MD 25 mL/hr at 03/27/22 0525 50 mcg/hr at 03/27/22 0525   ondansetron (ZOFRAN) tablet 4 mg  4 mg Oral Q6H PRN  Mansy, Jan A, MD       Or   ondansetron [facility]) injection 4 mg  4 mg Intravenous Q6H PRN Mansy, Jan A, MD       pantoprozole (PROTONIX) 80 mg /NS 100 mL infusion  8 mg/hr Intravenous Continuous Paulette Blanch, MD 10 mL/hr at 03/27/22 0346 8 mg/hr at 03/27/22 0346   thiamine (VITAMIN B1) tablet 100 mg  100 mg Oral Daily Paulette Blanch, MD       traZODone (DESYREL) tablet 25 mg  25 mg Oral QHS PRN Mansy, Arvella Merles, MD       Current Outpatient Medications  Medication Sig Dispense Refill   meloxicam (MOBIC) 15 MG tablet Take 15 mg by mouth daily.     pantoprazole (PROTONIX) 40 MG tablet Take 1 tablet (40 mg total) by mouth daily. 30 tablet 0   EPINEPHrine 0.3 mg/0.3 mL IJ SOAJ injection Inject 0.3 mLs (0.3 mg total) into the muscle as needed for anaphylaxis. 1 Device 0   ondansetron (ZOFRAN) 4 MG tablet Take 4 mg by mouth daily as needed.      Allergies as of 03/26/2022 - Review Complete 03/26/2022  Allergen Reaction Noted   Anesthesia s-i-40 [propofol]  09/14/2021   Bee venom Swelling 03/05/2018   Bupropion Anxiety and Other (See Comments) 02/04/2014     Review of Systems:    All systems reviewed and negative except where noted in HPI.      Physical Exam:  Vital signs in last 24 hours: Temp:  [98 F (36.7 C)-98.2 F (36.8 C)] 98 F (36.7 C) (09/26 0743) Pulse Rate:  [49-68] 55 (09/26 0630) Resp:  [12-18] 14 (09/26 0630) BP: (135-196)/(66-97) 143/73 (09/26 0630) SpO2:  [98 %-100 %] 100 %  (09/26 0630) Weight:  [49 kg] 49 kg (09/25 1816)   General:   Pleasant woman in NAD Head:  Normocephalic and atraumatic. Eyes:   No icterus.   Conjunctiva pink. Ears:  Normal auditory acuity. Mouth: Mucosa pink moist, no lesions. Neck:  Supple; no masses felt Lungs:  Respirations even and unlabored. Lungs clear to auscultation bilaterally.   No wheezes, crackles, or rhonchi.  Heart:  S1S2, RRR, no MRG. No edema. Abdomen:   Flat, soft, nondistended, nontender. Normal bowel sounds. No appreciable masses or hepatomegaly. No rebound signs or other peritoneal signs. Rectal:  Not performed.  Msk:  MAEW x4, No clubbing or cyanosis. Strength 5/5. Symmetrical without gross deformities. Neurologic:  Alert and  oriented x4;  Cranial nerves II-XII intact.  Skin:  Warm, dry, pink without significant lesions or rashes. Psych:  Alert and cooperative. Normal affect. Limited judgement  LAB RESULTS: Recent Labs    03/26/22 1819 03/27/22 0528  WBC 8.9 6.9  HGB 13.0 10.6*  HCT 37.8 30.4*  PLT 143* 107*   BMET Recent Labs    03/26/22 1819 03/27/22 0528  NA 138 139  K 3.6 3.1*  CL 102 105  CO2 25 25  GLUCOSE 130* 103*  BUN 20 15  CREATININE 0.78 0.57  CALCIUM 10.2 9.2   LFT Recent Labs    03/27/22 0528  PROT 6.7  ALBUMIN 3.7  AST 16  ALT 8  ALKPHOS 83  BILITOT 1.6*   PT/INR Recent Labs    03/27/22 0211  LABPROT 14.3  INR 1.1    STUDIES: DG Chest Port 1 View  Result Date: 03/27/2022 CLINICAL DATA:  Abdominal pain, vomiting EXAM: PORTABLE CHEST 1 VIEW COMPARISON:  09/14/2021 FINDINGS: Heart and mediastinal contours are  within normal limits. No focal opacities or effusions. No acute bony abnormality. IMPRESSION: No active disease. Electronically Signed   By: Rolm Baptise M.D.   On: 03/27/2022 00:41       Impression / Plan:   Epiagstic pain/hematemesis/recent history of nsaid/h pylori perforated ulcer with repair- agree with pantoprazole/octreotide/ivf and following hgb's.  Discussed her ongoing etoh and nsaids use with her/recent history. Note she is smoking cigarettes- and discussed her risks and concerns for chronic liver disease and her chronic pancreatitis. Recommend abstinence and avoiding nsaids, will need to be on acid suppression indefinitely. For now recommend egd for luminal evaluation- possibly this afternoon, will discuss with Dr Haig Prophet. She does not have an acute abdomen at this time but will assess 2view abdomen given her perforation history. Procedure indication/risks/benefits discussed with her Chronic pancreatitis/colon cancer screening/possible chronic liver disease- will need to follow up with GI as o/p  Thank you very much for this consult. These services were provided by Stephens November, NP-C, in collaboration with Lesly Rubenstein, MD, with whom I have discussed this patient in full.   Addnedum- in further consideration and discussion with Dr Haig Prophet will hold ab films and repeat CT a/p with IV contrast prior to EGD Stephens November, NP-C

## 2022-03-27 NOTE — Progress Notes (Signed)
Patient seen in the ED. Husband at bedside. Came in with abdominal pain and vomiting with blood clot. Patient has history of peptic ulcer disease risk underwent surgery for perforator peptic ulcer in March 2023. She continues to drink alcohol, smoke and takes PRN meloxicam for pain. CT abdomen showed possible peptic ulcer at the pylorus. She was started on IV Protonix drip and IV octreotide hemoglobin is stable. G.I. Dr. Haig Prophet performed upper G.I. endoscopy found to have another gastric ulcer. Recommend to continue IV Protonix and clear liquid diet. Patient advised on smoking cessation, abstain from drinking and avoid end-stage or aspirin like medicine. No charge

## 2022-03-27 NOTE — Assessment & Plan Note (Signed)
-   The patient will be on PPI therapy as mentioned above.

## 2022-03-27 NOTE — Assessment & Plan Note (Signed)
-   The patient will be placed on CIWA protocol.

## 2022-03-27 NOTE — Op Note (Signed)
[facility] Gastroenterology Patient Name: Maria [name] Procedure Date: 03/27/2022 2:30 PM MRN: [id number] Account #: [id number] Date of Birth: [birth year] Admit Type: Outpatient Age: 51 Room: [facility] ENDO ROOM 1 Gender: Female Note Status: Finalized Instrument Name: Upper Endoscope [id number] Procedure:             Upper GI endoscopy Indications:           Abnormal CT of the GI tract Providers:             Andrey Farmer MD, MD Referring MD:          No Local Md, MD (Referring MD) Medicines:             Monitored Anesthesia Care Complications:         No immediate complications. Estimated blood loss:                         Minimal. Procedure:             Pre-Anesthesia Assessment:                        - Prior to the procedure, a History and Physical was                         performed, and patient medications and allergies were                         reviewed. The patient is competent. The risks and                         benefits of the procedure and the sedation options and                         risks were discussed with the patient. All questions                         were answered and informed consent was obtained.                         Patient identification and proposed procedure were                         verified by the physician, the nurse, the                         anesthesiologist, the anesthetist and the technician                         in the endoscopy suite. Mental Status Examination:                         alert and oriented. Airway Examination: normal                         oropharyngeal airway and neck mobility. Respiratory                         Examination: clear to auscultation. CV Examination:  normal. Prophylactic Antibiotics: The patient does not                         require prophylactic antibiotics. Prior                         Anticoagulants: The patient has taken no previous                          anticoagulant or antiplatelet agents. ASA Grade                         Assessment: E - Emergency. After reviewing the risks                         and benefits, the patient was deemed in satisfactory                         condition to undergo the procedure. The anesthesia                         plan was to use monitored anesthesia care (MAC).                         Immediately prior to administration of medications,                         the patient was re-assessed for adequacy to receive                         sedatives. The heart rate, respiratory rate, oxygen                         saturations, blood pressure, adequacy of pulmonary                         ventilation, and response to care were monitored                         throughout the procedure. The physical status of the                         patient was re-assessed after the procedure.                        After obtaining informed consent, the endoscope was                         passed under direct vision. Throughout the procedure,                         the patient's blood pressure, pulse, and oxygen                         saturations were monitored continuously. The Endoscope                         was introduced through the mouth, and advanced to the  second part of duodenum. The upper GI endoscopy was                         accomplished without difficulty. The patient tolerated                         the procedure well. Findings:      LA Grade A (one or more mucosal breaks less than 5 mm, not extending       between tops of 2 mucosal folds) esophagitis with no bleeding was found.      The exam of the esophagus was otherwise normal.      One non-bleeding cratered gastric ulcer with a flat pigmented spot       (Forrest Class IIc) was found at the pylorus. The lesion was 20 mm in       largest dimension. Biopsies were taken with a cold forceps for       Helicobacter pylori  testing. Estimated blood loss was minimal.      Diffuse moderately erythematous mucosa without active bleeding and with       no stigmata of bleeding was found in the duodenal bulb. Impression:            - LA Grade A esophagitis with no bleeding.                        - Non-bleeding gastric ulcer with a flat pigmented                         spot (Forrest Class IIc). Biopsied.                        - Erythematous duodenopathy. Recommendation:        - Return patient to hospital ward for ongoing care.                        - Clear liquid diet today.                        - Use Protonix (pantoprazole) 40 mg IV BID and then                         can change to PO PPI tomorrow. Can discontinue                         octreotide.                        - No ibuprofen, naproxen, or other non-steroidal                         anti-inflammatory drugs.                        - Repeat upper endoscopy in 12 weeks to check healing.                        - Await pathology results. Procedure Code(s):     --- Professional ---  25834, Esophagogastroduodenoscopy, flexible,                         transoral; with biopsy, single or multiple Diagnosis Code(s):     --- Professional ---                        K20.90, Esophagitis, unspecified without bleeding                        K25.9, Gastric ulcer, unspecified as acute or chronic,                         without hemorrhage or perforation                        K31.89, Other diseases of stomach and duodenum                        R93.3, Abnormal findings on diagnostic imaging of                         other parts of digestive tract CPT copyright 2019 [employer]. All rights reserved. The codes documented in this report are preliminary and upon coder review may  be revised to meet current compliance requirements. Andrey Farmer MD, MD 03/27/2022 2:49:06 PM Number of Addenda: 0 Note Initiated On: 03/27/2022  2:30 PM Estimated Blood Loss:  Estimated blood loss was minimal.      [facility]

## 2022-03-27 NOTE — Transfer of Care (Signed)
Immediate Anesthesia Transfer of Care Note  Patient: Maria [name]  Procedure(s) Performed: ESOPHAGOGASTRODUODENOSCOPY (EGD) WITH PROPOFOL  Patient Location: PACU  Anesthesia Type:General  Level of Consciousness: awake and sedated  Airway & Oxygen Therapy: Patient Spontanous Breathing and Patient connected to nasal cannula oxygen  Post-op Assessment: Report given to RN and Post -op Vital signs reviewed and stable  Post vital signs: Reviewed and stable  Last Vitals:  Vitals Value Taken Time  BP    Temp    Pulse    Resp    SpO2      Last Pain:  Vitals:   03/27/22 0218  TempSrc:   PainSc: 7          Complications: No notable events documented.

## 2022-03-27 NOTE — Assessment & Plan Note (Addendum)
-   The patient be admitted to a progressive unit bed. - Differential diagnoses include acute gastritis/duodenitis, gastric or duodenal ulcer or erosion secondary to alcohol abuse as well as NSAIDs. - We will continue her on IV Protonix drip. - We will follow serial H&H. - GI consult will be obtained. - I notified Dr. Haig Prophet about the patient.

## 2022-03-27 NOTE — Anesthesia Preprocedure Evaluation (Addendum)
Anesthesia Evaluation  Patient identified by MRN, date of birth, ID band Patient awake    Reviewed: Allergy & Precautions, NPO status , Patient's Chart, lab work & pertinent test results  History of Anesthesia Complications Negative for: history of anesthetic complications  Airway Mallampati: III   Neck ROM: Full    Dental  (+) Missing, Poor Dentition   Pulmonary Current Smoker (1.5 ppd) and Patient abstained from smoking.,    Pulmonary exam normal breath sounds clear to auscultation       Cardiovascular hypertension, Normal cardiovascular exam Rhythm:Regular Rate:Normal  ECG 03/26/22:  Sinus bradycardia (HR 55) Indeterminate axis Nonspecific T wave abnormality   Neuro/Psych PSYCHIATRIC DISORDERS Anxiety Depression Alcohol use disorder, 2-3 drinks per day    GI/Hepatic PUD (hx perforated gastric ulcer s/p repair 08/2021), GERD  ,  Endo/Other  negative endocrine ROS  Renal/GU negative Renal ROS     Musculoskeletal   Abdominal   Peds  Hematology negative hematology ROS (+)   Anesthesia Other Findings   Reproductive/Obstetrics                            Anesthesia Physical Anesthesia Plan  ASA: 3  Anesthesia Plan: General   Post-op Pain Management:    Induction: Intravenous  PONV Risk Score and Plan: 2 and Propofol infusion, TIVA and Treatment may vary due to age or medical condition  Airway Management Planned: Natural Airway  Additional Equipment:   Intra-op Plan:   Post-operative Plan:   Informed Consent: I have reviewed the patients History and Physical, chart, labs and discussed the procedure including the risks, benefits and alternatives for the proposed anesthesia with the patient or authorized representative who has indicated his/her understanding and acceptance.       Plan Discussed with: CRNA  Anesthesia Plan Comments: (LMA/GETA backup discussed.  Patient  consented for risks of anesthesia including but not limited to:  - adverse reactions to medications - damage to eyes, teeth, lips or other oral mucosa - nerve damage due to positioning  - sore throat or hoarseness - damage to heart, brain, nerves, lungs, other parts of body or loss of life  Informed patient about role of CRNA in peri- and intra-operative care.  Patient voiced understanding.)        Anesthesia Quick Evaluation

## 2022-03-27 NOTE — Anesthesia Procedure Notes (Signed)
Date/Time: 03/27/2022 2:37 PM  Performed by: Donalda Ewings, CindyPre-anesthesia Checklist: Patient identified, Emergency Drugs available, Suction available, Patient being monitored and Timeout performed Patient Re-evaluated:Patient Re-evaluated prior to induction Oxygen Delivery Method: Nasal cannula Preoxygenation: Pre-oxygenation with 100% oxygen Induction Type: IV induction Airway Equipment and Method: Bite block Placement Confirmation: positive ETCO2 and CO2 detector

## 2022-03-28 ENCOUNTER — Encounter: Payer: Self-pay | Admitting: Gastroenterology

## 2022-03-28 DIAGNOSIS — K922 Gastrointestinal hemorrhage, unspecified: Secondary | ICD-10-CM | POA: Diagnosis not present

## 2022-03-28 LAB — COMPREHENSIVE METABOLIC PANEL
ALT: 8 U/L (ref 0–44)
AST: 15 U/L (ref 15–41)
Albumin: 3.4 g/dL — ABNORMAL LOW (ref 3.5–5.0)
Alkaline Phosphatase: 76 U/L (ref 38–126)
Anion gap: 7 (ref 5–15)
BUN: 11 mg/dL (ref 6–20)
CO2: 23 mmol/L (ref 22–32)
Calcium: 9.2 mg/dL (ref 8.9–10.3)
Chloride: 111 mmol/L (ref 98–111)
Creatinine, Ser: 0.6 mg/dL (ref 0.44–1.00)
GFR, Estimated: >60 mL/min (ref >60)
Glucose, Bld: 95 mg/dL (ref 70–99)
Potassium: 4.3 mmol/L (ref 3.5–5.1)
Sodium: 141 mmol/L (ref 135–145)
Total Bilirubin: 1.4 mg/dL — ABNORMAL HIGH (ref 0.3–1.2)
Total Protein: 6.3 g/dL — ABNORMAL LOW (ref 6.5–8.1)

## 2022-03-28 LAB — CBC WITH DIFFERENTIAL/PLATELET
Abs Immature Granulocytes: 0.01 10*3/uL (ref 0.00–0.07)
Basophils Absolute: 0 10*3/uL (ref 0.0–0.1)
Basophils Relative: 0 %
Eosinophils Absolute: 0.1 10*3/uL (ref 0.0–0.5)
Eosinophils Relative: 1 %
HCT: 32 % — ABNORMAL LOW (ref 36.0–46.0)
Hemoglobin: 10.8 g/dL — ABNORMAL LOW (ref 12.0–15.0)
Immature Granulocytes: 0 %
Lymphocytes Relative: 34 %
Lymphs Abs: 1.3 10*3/uL (ref 0.7–4.0)
MCH: 34 pg (ref 26.0–34.0)
MCHC: 33.8 g/dL (ref 30.0–36.0)
MCV: 100.6 fL — ABNORMAL HIGH (ref 80.0–100.0)
Monocytes Absolute: 0.3 10*3/uL (ref 0.1–1.0)
Monocytes Relative: 9 %
Neutro Abs: 2.2 10*3/uL (ref 1.7–7.7)
Neutrophils Relative %: 56 %
Platelets: 115 10*3/uL — ABNORMAL LOW (ref 150–400)
RBC: 3.18 MIL/uL — ABNORMAL LOW (ref 3.87–5.11)
RDW: 12 % (ref 11.5–15.5)
WBC: 3.9 10*3/uL — ABNORMAL LOW (ref 4.0–10.5)
nRBC: 0 % (ref 0.0–0.2)

## 2022-03-28 MED ORDER — PANTOPRAZOLE SODIUM 40 MG PO TBEC: 40.0000 mg | DELAYED_RELEASE_TABLET | Freq: Every day | ORAL | 1 refills | Status: DC

## 2022-03-28 NOTE — Discharge Instructions (Signed)
Advised to follow-up with primary care physician in 1 week. Advised to follow-up with gastroenterology as scheduled. Advised to take pantoprazole 40 mg daily. We will request PCP to work-up her for bradycardia.

## 2022-03-28 NOTE — Discharge Summary (Signed)
Physician Discharge Summary  Maria [name]:[id number] DOB: 02-04-1971 DOA: 03/27/2022  PCP: [name], NP  Admit date: 03/27/2022  Discharge date: 03/28/2022  Admitted From: Home. Disposition:  Home.  Recommendations for Outpatient Follow-up:  Follow up with PCP in 1-2 weeks. Please obtain BMP/CBC in one week. Advised to follow-up with Gastroenterology as scheduled. Advised to take pantoprazole 40 mg daily. We will request PCP to work-up her for bradycardia.  Home Health:None Equipment/Devices:None  Discharge Condition: Stable CODE STATUS:Full code Diet recommendation: Heart Healthy   Brief [facility] Course: This 51 years old female presented in the ED  with abdominal pain and vomiting with blood clot. Patient has history of peptic ulcer disease,  she underwent surgery for perforated peptic ulcer disease in March 2023. She continues to drink alcohol, smoke and takes PRN meloxicam for pain. CT abdomen showed possible peptic ulcer at the pylorus. She was started on IV Protonix drip and IV octreotide.  He was admitted for further evaluation. Hemoglobin remains stable. G.I. Dr. Haig Prophet performed upper G.I. endoscopy found to have another gastric ulcer. Recommend to continue IV Protonix and clear liquid diet. Patient advised on smoking cessation, abstain from drinking and avoid end-stage or aspirin like medicine.  H&H remains stable.  Patient feels better and wants to be discharged.  She tolerated soft bland diet.  Patient continues to have bradycardia said her heart rate runs in the 60s.  She has appointment with primary care physician for further evaluation.  Patient is being discharged home.  Patient is cleared from GI to be discharged.  Discharge Diagnoses:  Principal Problem:   Upper GI bleeding Active Problems:   PUD (peptic ulcer disease)   Essential hypertension   Alcohol abuse  Discharge Instructions  Discharge Instructions     Call MD for:  difficulty  breathing, headache or visual disturbances   Complete by: As directed    Call MD for:  persistant dizziness or light-headedness   Complete by: As directed    Call MD for:  persistant nausea and vomiting   Complete by: As directed    Diet - low sodium heart healthy   Complete by: As directed    Diet Carb Modified   Complete by: As directed    Discharge instructions   Complete by: As directed    Advised to follow-up with primary care physician in 1 week. Advised to follow-up with gastroenterology as scheduled. Advised to take pantoprazole 40 mg daily. We will request PCP to work-up her for bradycardia.   Increase activity slowly   Complete by: As directed       Allergies as of 03/28/2022       Reactions   Anesthesia S-i-40 [propofol]    Bee Venom Swelling   Bupropion Anxiety, Other (See Comments)   Worsened anxiety        Medication List     STOP taking these medications    meloxicam 15 MG tablet Commonly known as: MOBIC       TAKE these medications    EPINEPHrine 0.3 mg/0.3 mL Soaj injection Commonly known as: EPI-PEN Inject 0.3 mLs (0.3 mg total) into the muscle as needed for anaphylaxis.   ondansetron 4 MG tablet Commonly known as: ZOFRAN Take 4 mg by mouth daily as needed.   pantoprazole 40 MG tablet Commonly known as: Protonix Take 1 tablet (40 mg total) by mouth daily.        Follow-up Information     Locklear, Hilton Cork, MD Follow  up in 1 month(s).   Specialty: Gastroenterology Why: Appointment on Tuesday, 05/15/2022 at 8:30am. Contact information: [address] [place] [address] [phone]                Allergies  Allergen Reactions   Anesthesia S-I-40 [Propofol]    Bee Venom Swelling   Bupropion Anxiety and Other (See Comments)    Worsened anxiety    Consultations: Gastroenterology   Procedures/Studies: CT ABDOMEN PELVIS W CONTRAST  Result Date: 03/27/2022 CLINICAL DATA:  Upper abdominal pain, hematemesis,  recent perforated ulcer and exploratory laparotomy EXAM: CT ABDOMEN AND PELVIS WITH CONTRAST TECHNIQUE: Multidetector CT imaging of the abdomen and pelvis was performed using the standard protocol following bolus administration of intravenous contrast. RADIATION DOSE REDUCTION: This exam was performed according to the departmental dose-optimization program which includes automated exposure control, adjustment of the mA and/or kV according to patient size and/or use of iterative reconstruction technique. CONTRAST:  155m OMNIPAQUE IOHEXOL 300 MG/ML  SOLN COMPARISON:  01/19/2022 FINDINGS: Lower chest: No acute abnormality. Hepatobiliary: No solid liver abnormality is seen. No gallstones, gallbladder wall thickening, or biliary dilatation. Pancreas: Calcific stigmata of chronic pancreatitis involving the pancreatic head. Unchanged atrophy of the pancreatic parenchyma and diffuse prominence of the pancreatic duct. Spleen: Normal in size without significant abnormality. Adrenals/Urinary Tract: Adrenal glands are unremarkable. Kidneys are normal, without renal calculi, solid lesion, or hydronephrosis. Bladder is unremarkable. Stomach/Bowel: Severe mucosal thickening and edema of the gastric antrum, pylorus, and duodenal bulb, with a new superiorly directed ulceration arising from the pylorus measuring 2.8 x 2.2 x 2.1 cm (series 5, image 24). This appears to be distinct from an ulceration of the anterior gastric antrum or distal gastric body seen on prior examination (in the vicinity of series 2, image 34). Appendix appears normal. Sigmoid diverticula. Vascular/Lymphatic: Aortic atherosclerosis. No enlarged abdominal or pelvic lymph nodes. Reproductive: Status post hysterectomy. Other: No abdominal wall hernia or abnormality. Unchanged small volume perihepatic ascites, likely loculated (series 2, image 13). New, trace perisplenic fluid (series 2, image 18). Musculoskeletal: No acute or significant osseous findings.  IMPRESSION: 1. Severe mucosal thickening and edema of the gastric antrum, pylorus, and duodenal bulb, with a new superiorly directed ulceration arising from the pylorus measuring 2.8 x 2.2 x 2.1 cm. This appears to be distinct from an ulceration of the anterior gastric antrum or distal gastric body seen on prior examination. No evidence of overt perforation or free air in the abdomen at this time. 2. Unchanged small volume perihepatic ascites, likely loculated. New, trace perisplenic ascites. 3. Calcific stigmata of chronic pancreatitis. Aortic Atherosclerosis (ICD10-I70.0). Electronically Signed   By: ADelanna AhmadiM.D.   On: 03/27/2022 11:11   DG Chest Port 1 View  Result Date: 03/27/2022 CLINICAL DATA:  Abdominal pain, vomiting EXAM: PORTABLE CHEST 1 VIEW COMPARISON:  09/14/2021 FINDINGS: Heart and mediastinal contours are within normal limits. No focal opacities or effusions. No acute bony abnormality. IMPRESSION: No active disease. Electronically Signed   By: KRolm BaptiseM.D.   On: 03/27/2022 00:41     Subjective: Patient was seen and examined at bedside.  Overnight events noted.   Patient reports feeling better.She underwent EGD and found to have ulcer which is nonbleeding.   H&H remains stable. Patient feels better,  denies dizziness and wants to be discharged.  Discharge Exam: Vitals:   03/28/22 0806 03/28/22 1150  BP: (!) 144/84 (!) 158/75  Pulse: (!) 52 (!) 45  Resp: 18 16  Temp: 97.6  F (36.4 C) 98.5 F (36.9 C)  SpO2: 100% 100%   Vitals:   03/27/22 2100 03/28/22 0114 03/28/22 0806 03/28/22 1150  BP:  (!) 152/77 (!) 144/84 (!) 158/75  Pulse:  (!) 52 (!) 52 (!) 45  Resp: '15 18 18 16  '$ Temp:  97.9 F (36.6 C) 97.6 F (36.4 C) 98.5 F (36.9 C)  TempSrc:  Oral Oral   SpO2:  100% 100% 100%  Weight:      Height:        General: Pt is alert, awake, not in acute distress Cardiovascular: RRR, S1/S2 +, no rubs, no gallops Respiratory: CTA bilaterally, no wheezing, no  rhonchi Abdominal: Soft, NT, ND, bowel sounds + Extremities: no edema, no cyanosis    The results of significant diagnostics from this hospitalization (including imaging, microbiology, ancillary and laboratory) are listed below for reference.     Microbiology: Recent Results (from the past 240 hour(s))  SARS Coronavirus 2 by RT PCR (hospital order, performed in [facility] hospital lab) *cepheid single result test* Anterior Nasal Swab     Status: None   Collection Time: 03/27/22  2:11 AM   Specimen: Anterior Nasal Swab  Result Value Ref Range Status   SARS Coronavirus 2 by RT PCR NEGATIVE NEGATIVE Final    Comment: (NOTE) SARS-CoV-2 target nucleic acids are NOT DETECTED.  The SARS-CoV-2 RNA is generally detectable in upper and lower respiratory specimens during the acute phase of infection. The lowest concentration of SARS-CoV-2 viral copies this assay can detect is 250 copies / mL. A negative result does not preclude SARS-CoV-2 infection and should not be used as the sole basis for treatment or other patient management decisions.  A negative result may occur with improper specimen collection / handling, submission of specimen other than nasopharyngeal swab, presence of viral mutation(s) within the areas targeted by this assay, and inadequate number of viral copies (<250 copies / mL). A negative result must be combined with clinical observations, patient history, and epidemiological information.  Fact Sheet for Patients:   https://www.patel.info/  Fact Sheet for Healthcare Providers: https://hall.com/  This test is not yet approved or  cleared by the [place] FDA and has been authorized for detection and/or diagnosis of SARS-CoV-2 by FDA under an Emergency Use Authorization (EUA).  This EUA will remain in effect (meaning this test can be used) for the duration of the COVID-19 declaration under Section 564(b)(1) of the Act, 21  U.S.C. section 360bbb-3(b)(1), unless the authorization is terminated or revoked sooner.  Performed at [facility], [address] [place], [place] [address]      Labs: BNP (last 3 results) Recent Labs    09/16/21 0525  BNP 144.3*   Basic Metabolic Panel: Recent Labs  Lab 03/26/22 1819 03/27/22 0528 03/28/22 0652  NA 138 139 141  K 3.6 3.1* 4.3  CL 102 105 111  CO2 '25 25 23  '$ GLUCOSE 130* 103* 95  BUN '20 15 11  '$ CREATININE 0.78 0.57 0.60  CALCIUM 10.2 9.2 9.2   Liver Function Tests: Recent Labs  Lab 03/26/22 1819 03/27/22 0528 03/28/22 0652  AST '20 16 15  '$ ALT '10 8 8  '$ ALKPHOS 108 83 76  BILITOT 1.4* 1.6* 1.4*  PROT 8.4* 6.7 6.3*  ALBUMIN 4.8 3.7 3.4*   Recent Labs  Lab 03/26/22 1819  LIPASE 23   No results for input(s): "AMMONIA" in the last 168 hours. CBC: Recent Labs  Lab 03/26/22 1819 03/27/22 0528 03/28/22 1540  WBC 8.9 6.9 3.9*  NEUTROABS  --   --  2.2  HGB 13.0 10.6* 10.8*  HCT 37.8 30.4* 32.0*  MCV 99.5 99.0 100.6*  PLT 143* 107* 115*   Cardiac Enzymes: No results for input(s): "CKTOTAL", "CKMB", "CKMBINDEX", "TROPONINI" in the last 168 hours. BNP: Invalid input(s): "POCBNP" CBG: No results for input(s): "GLUCAP" in the last 168 hours. D-Dimer No results for input(s): "DDIMER" in the last 72 hours. Hgb A1c No results for input(s): "HGBA1C" in the last 72 hours. Lipid Profile No results for input(s): "CHOL", "HDL", "LDLCALC", "TRIG", "CHOLHDL", "LDLDIRECT" in the last 72 hours. Thyroid function studies No results for input(s): "TSH", "T4TOTAL", "T3FREE", "THYROIDAB" in the last 72 hours.  Invalid input(s): "FREET3" Anemia work up No results for input(s): "VITAMINB12", "FOLATE", "FERRITIN", "TIBC", "IRON", "RETICCTPCT" in the last 72 hours. Urinalysis    Component Value Date/Time   COLORURINE YELLOW (A) 03/26/2022 1819   APPEARANCEUR CLEAR (A) 03/26/2022 1819   LABSPEC 1.016 03/26/2022 1819   PHURINE 6.0 03/26/2022  1819   GLUCOSEU NEGATIVE 03/26/2022 1819   HGBUR SMALL (A) 03/26/2022 1819   BILIRUBINUR NEGATIVE 03/26/2022 1819   KETONESUR 5 (A) 03/26/2022 1819   PROTEINUR NEGATIVE 03/26/2022 1819   UROBILINOGEN 1.0 04/28/2014 0234   NITRITE NEGATIVE 03/26/2022 1819   LEUKOCYTESUR NEGATIVE 03/26/2022 1819   Sepsis Labs Recent Labs  Lab 03/26/22 1819 03/27/22 0528 03/28/22 0652  WBC 8.9 6.9 3.9*   Microbiology Recent Results (from the past 240 hour(s))  SARS Coronavirus 2 by RT PCR (hospital order, performed in [facility] hospital lab) *cepheid single result test* Anterior Nasal Swab     Status: None   Collection Time: 03/27/22  2:11 AM   Specimen: Anterior Nasal Swab  Result Value Ref Range Status   SARS Coronavirus 2 by RT PCR NEGATIVE NEGATIVE Final    Comment: (NOTE) SARS-CoV-2 target nucleic acids are NOT DETECTED.  The SARS-CoV-2 RNA is generally detectable in upper and lower respiratory specimens during the acute phase of infection. The lowest concentration of SARS-CoV-2 viral copies this assay can detect is 250 copies / mL. A negative result does not preclude SARS-CoV-2 infection and should not be used as the sole basis for treatment or other patient management decisions.  A negative result may occur with improper specimen collection / handling, submission of specimen other than nasopharyngeal swab, presence of viral mutation(s) within the areas targeted by this assay, and inadequate number of viral copies (<250 copies / mL). A negative result must be combined with clinical observations, patient history, and epidemiological information.  Fact Sheet for Patients:   https://www.patel.info/  Fact Sheet for Healthcare Providers: https://hall.com/  This test is not yet approved or  cleared by the [place] FDA and has been authorized for detection and/or diagnosis of SARS-CoV-2 by FDA under an Emergency Use Authorization (EUA).   This EUA will remain in effect (meaning this test can be used) for the duration of the COVID-19 declaration under Section 564(b)(1) of the Act, 21 U.S.C. section 360bbb-3(b)(1), unless the authorization is terminated or revoked sooner.  Performed at [facility], [address], [place],  [address]      Time coordinating discharge: Over 30 minutes  SIGNED:   Shawna Clamp, MD  [facility] 03/28/2022, 5:49 PM Pager   If 7PM-7AM, please contact night-coverage

## 2022-03-28 NOTE — Progress Notes (Signed)
Discharge instructions explained to pt/ verbalized an understanding/ ivs and tele removed/ transported off unit via wheelchair.  

## 2022-03-28 NOTE — Anesthesia Postprocedure Evaluation (Signed)
Anesthesia Post Note  Patient: Maria [name]  Procedure(s) Performed: ESOPHAGOGASTRODUODENOSCOPY (EGD) WITH PROPOFOL  Patient location during evaluation: PACU Anesthesia Type: General Level of consciousness: awake and alert, oriented and patient cooperative Pain management: pain level controlled Vital Signs Assessment: post-procedure vital signs reviewed and stable Respiratory status: spontaneous breathing, nonlabored ventilation and respiratory function stable Cardiovascular status: blood pressure returned to baseline and stable Postop Assessment: adequate PO intake Anesthetic complications: no   No notable events documented.   Last Vitals:  Vitals:   03/27/22 2100 03/28/22 0114  BP:  (!) 152/77  Pulse:  (!) 52  Resp: 15 18  Temp:  36.6 C  SpO2:  100%    Last Pain:  Vitals:   03/28/22 0114  TempSrc: Oral  PainSc:                  Darrin Nipper

## 2022-03-28 NOTE — TOC CM/SW Note (Signed)
Patient has orders to discharge home today. Chart reviewed. No TOC needs identified. CSW signing off.  Tyla Burgner, CSW [phone]  

## 2022-03-29 LAB — SURGICAL PATHOLOGY

## 2022-03-30 ENCOUNTER — Encounter: Payer: Self-pay | Admitting: Primary Care

## 2022-03-30 ENCOUNTER — Ambulatory Visit (INDEPENDENT_AMBULATORY_CARE_PROVIDER_SITE_OTHER): Payer: BC Managed Care – PPO | Admitting: Primary Care

## 2022-03-30 VITALS — BP 146/64 | HR 77 | Temp 97.3°F | Ht 61.0 in | Wt 112.0 lb

## 2022-03-30 DIAGNOSIS — K279 Peptic ulcer, site unspecified, unspecified as acute or chronic, without hemorrhage or perforation: Secondary | ICD-10-CM

## 2022-03-30 DIAGNOSIS — F1010 Alcohol abuse, uncomplicated: Secondary | ICD-10-CM

## 2022-03-30 DIAGNOSIS — Z23 Encounter for immunization: Secondary | ICD-10-CM | POA: Diagnosis not present

## 2022-03-30 DIAGNOSIS — I10 Essential (primary) hypertension: Secondary | ICD-10-CM | POA: Diagnosis not present

## 2022-03-30 DIAGNOSIS — Z1231 Encounter for screening mammogram for malignant neoplasm of breast: Secondary | ICD-10-CM

## 2022-03-30 LAB — CBC
HCT: 33.9 % — ABNORMAL LOW (ref 36.0–46.0)
Hemoglobin: 11.8 g/dL — ABNORMAL LOW (ref 12.0–15.0)
MCHC: 34.8 g/dL (ref 30.0–36.0)
MCV: 101.8 fl — ABNORMAL HIGH (ref 78.0–100.0)
Platelets: 158 10*3/uL (ref 150.0–400.0)
RBC: 3.33 Mil/uL — ABNORMAL LOW (ref 3.87–5.11)
RDW: 12.6 % (ref 11.5–15.5)
WBC: 5.5 10*3/uL (ref 4.0–10.5)

## 2022-03-30 MED ORDER — AMLODIPINE BESYLATE 5 MG PO TABS: 5.0000 mg | ORAL_TABLET | Freq: Every day | ORAL | 0 refills | Status: DC

## 2022-03-30 NOTE — Patient Instructions (Addendum)
[facility].   Start taking pantoprazole 40 mg once daily for stomach ulcer.  Continue to sustain from alcohol.  Start amlodipine 5 mg once daily for blood pressure.  Please schedule a follow up visit to meet back with me in 2-3 weeks for blood pressure check.   It was a pleasure to see you today!

## 2022-03-30 NOTE — Assessment & Plan Note (Signed)
Commended her on cessation, encouraged to continue.

## 2022-03-30 NOTE — Assessment & Plan Note (Signed)
Above goal today, also on numerous other visits within her chart.  Given readings today, coupled with FH of HTN, will treat. She agrees.  Start amlodipine 5 mg daily.  She will check BP and HR at home.  We will see her back in 2-3 weeks for BP check.

## 2022-03-30 NOTE — Assessment & Plan Note (Signed)
Recent hospitalization, reviewed hospital labs/imaging/notes. Appears much better compared to last visit.  Discussed the importance of starting pantoprazole 40 mg daily.  Discussed to remain off alcohol and to work on tobacco cessation.  Follow up with GI as scheduled.

## 2022-03-30 NOTE — Progress Notes (Signed)
Subjective:    Patient ID: Maria [name], female    DOB: Mar 05, 1971, 51 y.o.   MRN: [id number]  HPI  Maria [name] is a very pleasant 51 y.o. female with a history of perforated ulcer in March 2023 requiring exploratory laparotomy.  Also with history of alcohol use, recurrent NSAID use, chronic pancreatitis, thrombocytopenia, AKI, hypertension who presents today for hospital follow-up.  She presented to [facility] ED on 03/27/2022 for persistent upper abdominal pain with 1 episode of vomiting of blood clot.  During her ED visit bilirubin was mildly elevated, other labs otherwise without acute decline.  She was treated with IV fluids, IV Protonix and admitted for further evaluation.  During her hospital stay she underwent upper endoscopy and was found to have a new gastric ulcer.  Recommendations were to stop smoking, abstain from drinking alcohol, avoid NSAIDs/aspirin.  She was noted to be bradycardic with heart rate in the 40s to 50s. She was feeling better so she was discharged home on 03/28/2022 with recommendations for PCP follow-up, BMP/CBC, GI follow-up, start pantoprazole 40 mg daily, further evaluation of bradycardia. She underwent ECG while in the ED which revealed sinus bradycardia, rate of 55, different from ECG in April 2023.  She is feeling much better. She has mild abdominal pain mostly to the epigastric and LUQ but overall significantly improved. She continues to smoke but plans on quitting. She has stopped drinking alcohol as of two nights ago. She has not been taking pantoprazole, wasn't sure if she was supposed to do so. She denies NSAID use.   She has a family history of hypertension in her father. She denies dizziness, headaches, chest pain. She does not checking BP or HR at home.     BP Readings from Last 3 Encounters:  03/30/22 (!) 146/64  03/28/22 (!) 158/75  02/08/22 136/64     Review of Systems  Respiratory:  Negative for shortness of breath.   Cardiovascular:   Negative for chest pain.  Gastrointestinal:  Positive for abdominal pain. Negative for blood in stool, nausea and vomiting.         Past Medical History:  Diagnosis Date   Acute postoperative anemia due to expected blood loss 09/19/2021   Anemia    Anesthesia complication, initial encounter 09/13/2021   Difficulty waking up from Anesthesia requiring re-intubation   Ankle edema, bilateral 11/15/2021   Anxiety    h/o   Depression    h/o   Family history of adverse reaction to anesthesia    mom-hard time waking up   Folate deficiency 07/30/2019   Gastritis 08/19/2020   GERD (gastroesophageal reflux disease)    tums prn   Hypertension    WAS PUT ON BP MED BY PCP LAST YEAR (2018) AND BP MED MADE PT SICK SO SHE STOPPED TAKING IT-PCP MONITORS BP NOW   Palpitations 05/18/2020   Pancreatitis    Perforated peptic ulcer ([place]) 09/14/2021   Poison ivy dermatitis 11/13/2019   Vitamin B12 deficiency 01/17/2018   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    Social History   Socioeconomic History   Marital status: Married    Spouse name: Not on file   Number of children: Not on file   Years of education: Not on file   Highest education level: Not on file  Occupational History   Not on file  Tobacco Use   Smoking status: Every Day    Packs/day: 1.00    Years: 20.00    Total pack years:  20.00    Types: Cigarettes   Smokeless tobacco: Never  Vaping Use   Vaping Use: Never used  Substance and Sexual Activity   Alcohol use: Yes    Alcohol/week: 2.0 standard drinks of alcohol    Types: 2 Glasses of wine per week    Comment: two glasses wine/beer every other day   Drug use: No   Sexual activity: Yes  Other Topics Concern   Not on file  Social History Narrative   Married.   1 child.    Working at [employer] at [employer].     Enjoys swimming, camping   Social Determinants of Health   Financial Resource Strain: Not on file  Food Insecurity: No Food Insecurity (03/27/2022)   Hunger Vital Sign     Worried About Running Out of Food in the Last Year: Never true    Ran Out of Food in the Last Year: Never true  Transportation Needs: No Transportation Needs (03/27/2022)   PRAPARE - Hydrologist (Medical): No    Lack of Transportation (Non-Medical): No  Physical Activity: Not on file  Stress: Not on file  Social Connections: Not on file  Intimate Partner Violence: Not At Risk (03/27/2022)   Humiliation, Afraid, Rape, and Kick questionnaire    Fear of Current or Ex-Partner: No    Emotionally Abused: No    Physically Abused: No    Sexually Abused: No    Past Surgical History:  Procedure Laterality Date   ANTERIOR AND POSTERIOR REPAIR WITH SACROSPINOUS FIXATION N/A 03/13/2018   Procedure: ANTERIOR REPAIR;  Surgeon: [name], MD;  Location: [facility];  Service: Gynecology;  Laterality: N/A;   ESOPHAGOGASTRODUODENOSCOPY (EGD) WITH PROPOFOL N/A 03/27/2022   Procedure: ESOPHAGOGASTRODUODENOSCOPY (EGD) WITH PROPOFOL;  Surgeon: Lesly Rubenstein, MD;  Location: [facility];  Service: Endoscopy;  Laterality: N/A;   INDUCED ABORTION     x2   IR CM INJ ANY COLONIC TUBE W/FLUORO  09/29/2021   LAPAROTOMY N/A 09/13/2021   Procedure: EXPLORATORY LAPAROTOMY-[place] PATCH repair;  Surgeon: [name], DO;  Location: [facility];  Service: General;  Laterality: N/A;   LAPAROTOMY N/A 09/17/2021   Procedure: EXPLORATORY LAPAROTOMY;  Surgeon: [name], MD;  Location: [facility];  Service: General;  Laterality: N/A;   REPAIR OF PERFORATED ULCER N/A 09/17/2021   Procedure: REPAIR OF PERFORATED ULCER;  Surgeon: [name], MD;  Location: [facility];  Service: General;  Laterality: N/A;   VAGINAL HYSTERECTOMY N/A 03/13/2018   Procedure: HYSTERECTOMY VAGINAL;  Surgeon: [name], MD;  Location: [facility];  Service: Gynecology;  Laterality: N/A;    Family History  Problem Relation Age of Onset   Arthritis Mother    Arthritis Father    Alcohol abuse Father     Hypertension Father    Heart disease Father    Aneurysm Father    Breast cancer Maternal Grandmother    Breast cancer Paternal Grandmother     Allergies  Allergen Reactions   Anesthesia S-I-40 [Propofol]    Bee Venom Swelling   Bupropion Anxiety and Other (See Comments)    Worsened anxiety    Current Outpatient Medications on File Prior to Visit  Medication Sig Dispense Refill   EPINEPHrine 0.3 mg/0.3 mL IJ SOAJ injection Inject 0.3 mLs (0.3 mg total) into the muscle as needed for anaphylaxis. (Patient not taking: Reported on 03/30/2022) 1 Device 0   ondansetron (ZOFRAN) 4 MG tablet Take 4 mg by mouth daily as needed. (  Patient not taking: Reported on 03/30/2022)     pantoprazole (PROTONIX) 40 MG tablet Take 1 tablet (40 mg total) by mouth daily. (Patient not taking: Reported on 03/30/2022) 30 tablet 1   No current facility-administered medications on file prior to visit.    BP (!) 146/64   Pulse 77   Temp (!) 97.3 F (36.3 C) (Oral)   Ht '5\' 1"'$  (1.549 m)   Wt 112 lb (50.8 kg)   LMP 02/24/2018 (Approximate) Comment: spotting   SpO2 99%   BMI 21.16 kg/m  Objective:   Physical Exam Constitutional:      General: She is not in acute distress. Cardiovascular:     Rate and Rhythm: Normal rate and regular rhythm.  Pulmonary:     Effort: Pulmonary effort is normal.     Breath sounds: Normal breath sounds.  Abdominal:     General: Bowel sounds are normal.     Palpations: Abdomen is soft.     Tenderness: There is abdominal tenderness in the epigastric area and left upper quadrant. There is no guarding.     Comments: Mild tenderness to epigastric and LUQ  Musculoskeletal:     Cervical back: Neck supple.  Skin:    General: Skin is warm and dry.           Assessment & Plan:   Problem List Items Addressed This Visit       Cardiovascular and Mediastinum   Essential hypertension    Above goal today, also on numerous other visits within her chart.  Given readings today,  coupled with FH of HTN, will treat. She agrees.  Start amlodipine 5 mg daily.  She will check BP and HR at home.  We will see her back in 2-3 weeks for BP check.      Relevant Medications   amLODipine (NORVASC) 5 MG tablet     Digestive   PUD (peptic ulcer disease) - Primary    Recent hospitalization, reviewed hospital labs/imaging/notes. Appears much better compared to last visit.  Discussed the importance of starting pantoprazole 40 mg daily.  Discussed to remain off alcohol and to work on tobacco cessation.  Follow up with GI as scheduled.       Relevant Orders   CBC     Other   Alcohol abuse    Commended her on cessation, encouraged to continue.      Other Visit Diagnoses     Encounter for screening mammogram for malignant neoplasm of breast       Need for immunization against influenza       Relevant Orders   Flu Vaccine QUAD 95moIM (Fluarix, Fluzone & Alfiuria Quad PF) (Completed)          KPleas Koch NP

## 2022-04-04 IMAGING — CT CT ABD-PELV W/ CM
2 of 5 series · 14 of 46 positions shown, 16 images · IV contrast (APPLIED)
Comparison: Previous studies including the immediate previous CT
done on 09/25/2021

CLINICAL DATA: Status post repair of perforated gastric ulcer

EXAM:
CT ABDOMEN AND PELVIS WITH CONTRAST
TECHNIQUE: Multidetector CT imaging of the abdomen and pelvis was performed
using the standard protocol following bolus administration of
intravenous contrast.

[Series 4: routine abd/pel with · axial · 0.81mm/px · z∈[-1081,-676]mm · 11 of 91 slices shown, 13 images]
[im 5/91  soft-tissue]
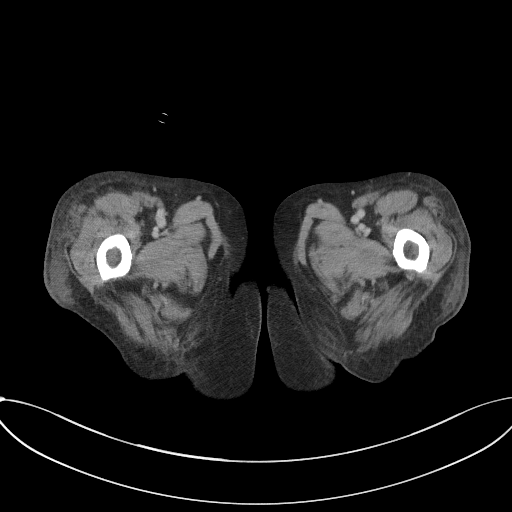
[im 5/91  bone]
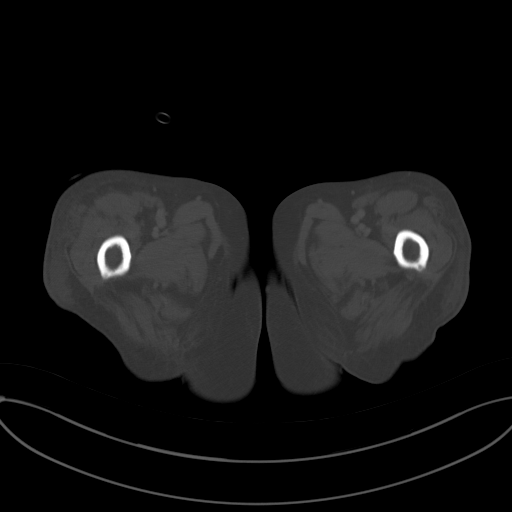
[im 14/91  soft-tissue]
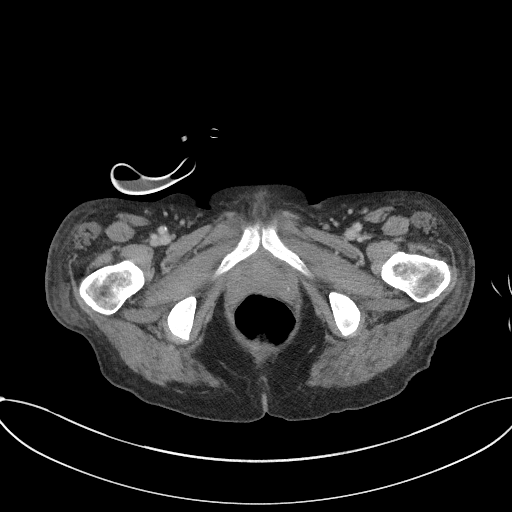
[im 23/91  soft-tissue]
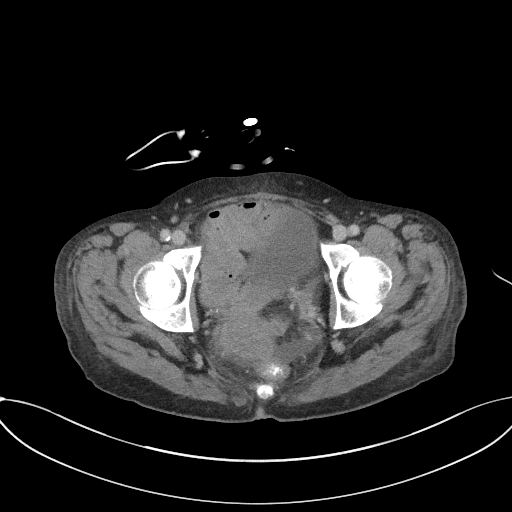
[im 32/91  soft-tissue]
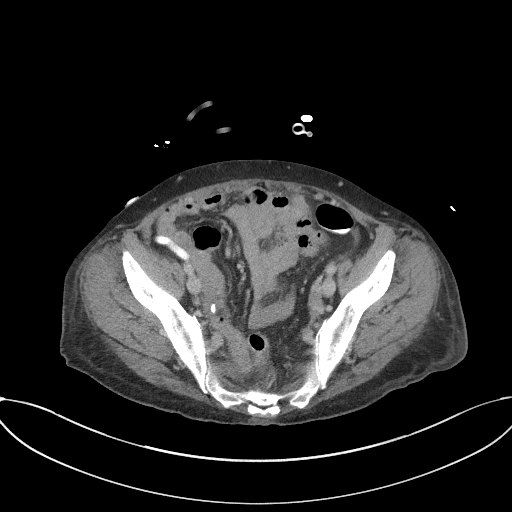
[im 37/91  soft-tissue]
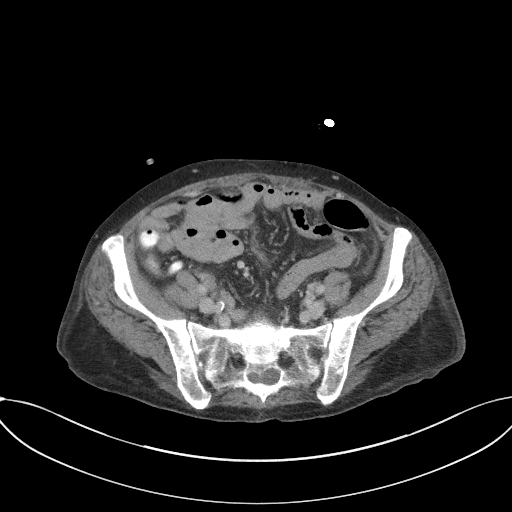
[im 46/91  soft-tissue]
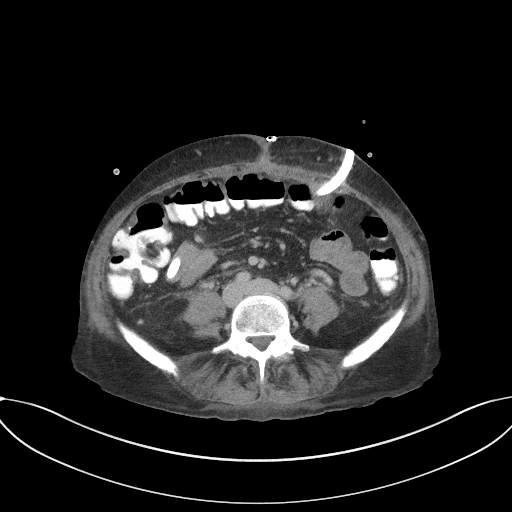
[im 55/91  soft-tissue]
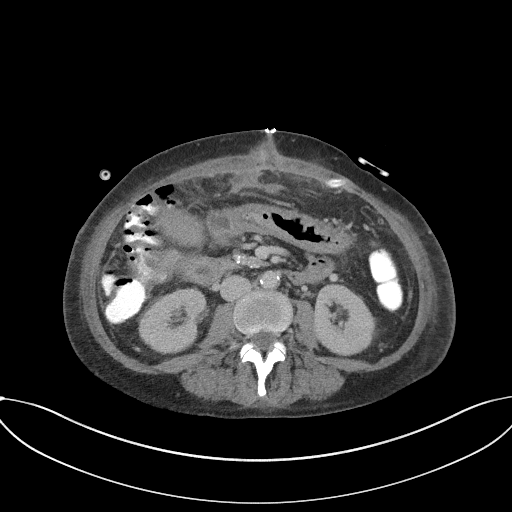
[im 59/91  soft-tissue]
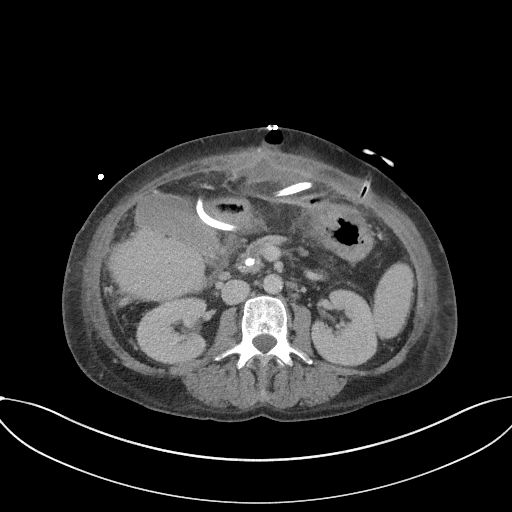
[im 68/91  soft-tissue]
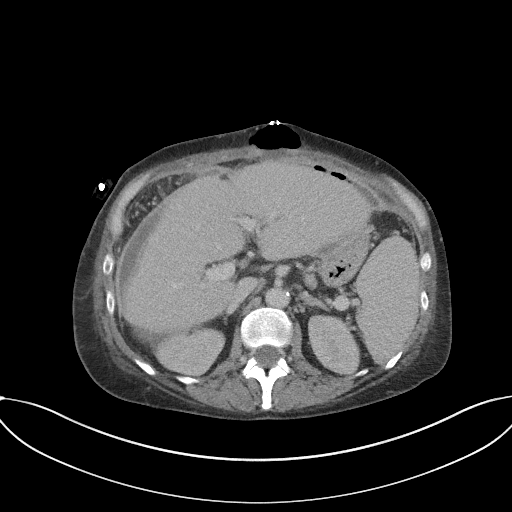
[im 68/91  bone]
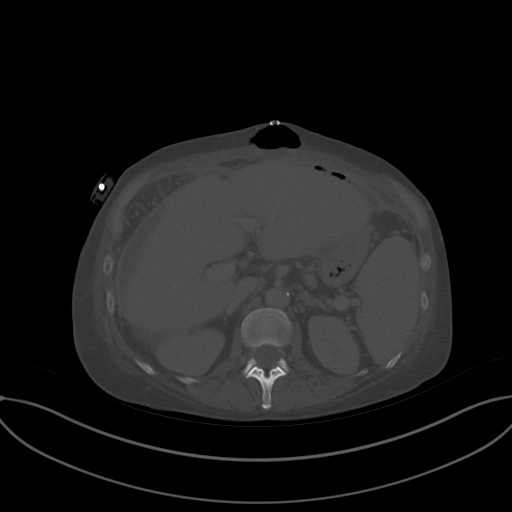
[im 77/91  soft-tissue]
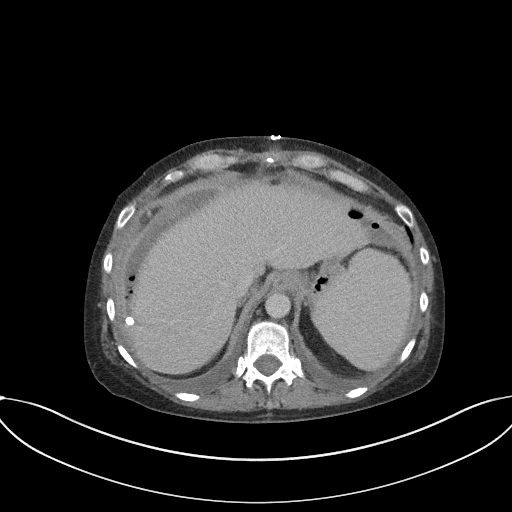
[im 86/91  soft-tissue]
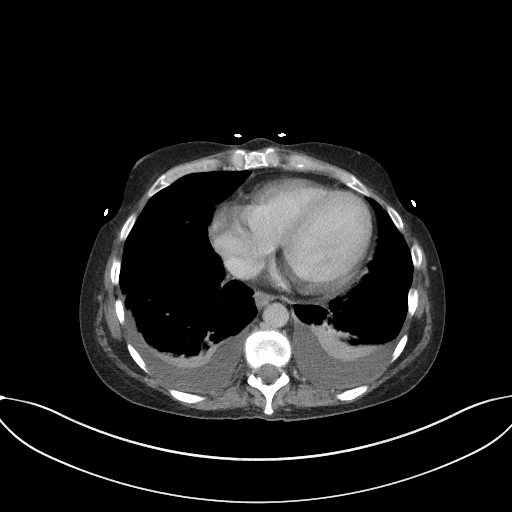

[Series 7: coronal st · coronal · 0.69mm/px · 3 of 84 slices shown]
[im 28/84  soft-tissue]
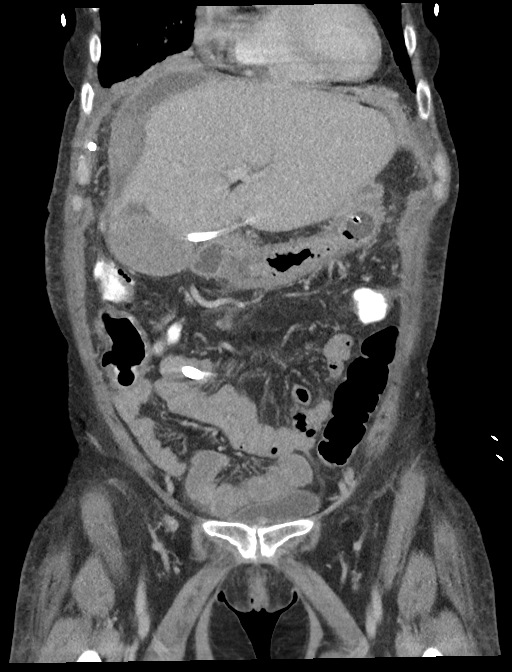
[im 37/84  soft-tissue]
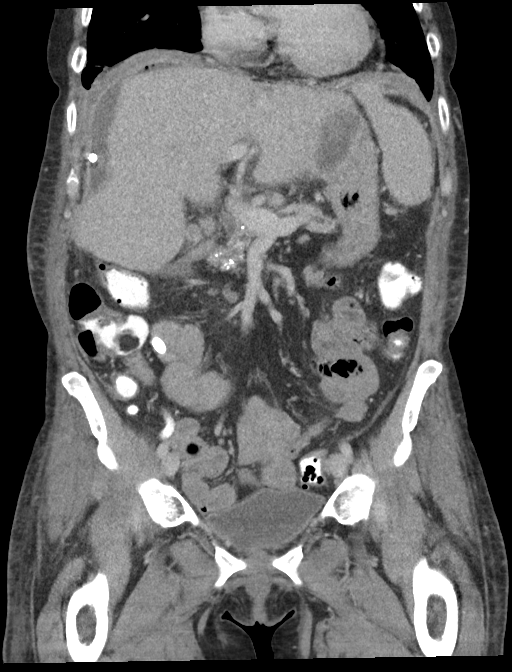
[im 47/84  soft-tissue]
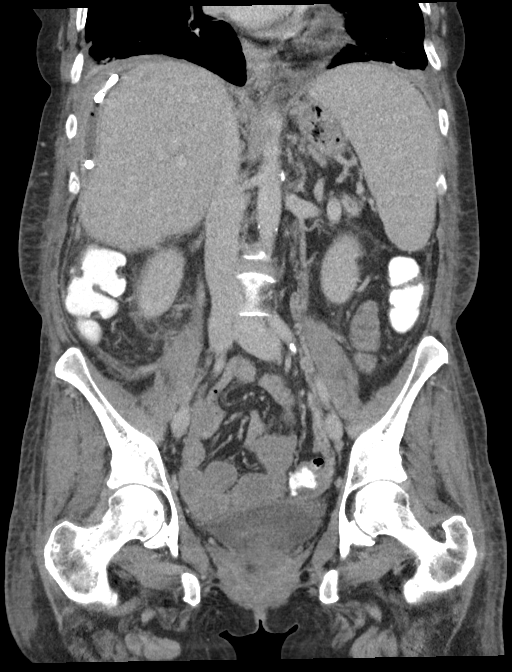

[14 of 46 positions shown; findings below may reference images not displayed]

RADIATION DOSE REDUCTION: This exam was performed according to the
departmental dose-optimization program which includes automated
exposure control, adjustment of the mA and/or kV according to
patient size and/or use of iterative reconstruction technique.

CONTRAST:  75mL OMNIPAQUE IOHEXOL 300 MG/ML  SOLN
FINDINGS: Lower chest: Small patchy infiltrates are seen in the posterior
lower lung fields. Small bilateral pleural effusions are seen with
no significant change.

Hepatobiliary: There is interval placement of percutaneous drainage
catheter along the lateral margin of right lobe of liver. There is
significant interval decrease in subcapsular fluid collection along
the anterior and lateral margins of liver. There small residual
pockets of air in the collection. In image 19 of series 4, there is
3.7 x 1.4 cm loculated fluid collection along the posterior margin
of left lobe of liver which also has decreased in size. No new fluid
collections are noted adjacent to the liver. There is no dilation of
bile ducts. Gallbladder is distended. There is no wall thickening in
gallbladder.

Pancreas: There is prominence of pancreatic duct. There is
pancreatic atrophy. There are scattered coarse calcifications in the
head of the pancreas. No significant interval changes are noted.

Spleen: Spleen measures 13.6 cm in length. No focal abnormality is
seen.

Adrenals/Urinary Tract: Adrenals are unremarkable. There is no
hydronephrosis. There are no renal or ureteral stones. Urinary
bladder is unremarkable.

Stomach/Bowel: Gastrostomy tube is noted in place. Stomach is not
distended. There are no new loculated fluid collections adjacent to
the stomach. Small bowel loops are not dilated. Surgical drain is
seen in the epigastrium. There is another surgical drain in the
lower abdomen with its tip in the right lower quadrant. Appendix is
not dilated. There is no significant wall thickening in the colon.
There is contrast in the lumen of colon from previous CT.

Vascular/Lymphatic: Unremarkable.

Reproductive: Uterus is not seen.

Other: There is no significant ascites. There is large pocket of air
measuring 4.6 cm in transverse diameter in the subcutaneous plane in
the upper abdomen. Defect seen in the anterior abdominal wall
muscles adjacent to this pocket of air seen in the previous study is
less evident in the current study. There is fluid density in the
area of the defect in the current study. Skin staples are seen in
the anterior abdominal wall.

Musculoskeletal: Unremarkable.
IMPRESSION: There is interval decrease in size of subcapsular loculated fluid
collection along the anterior and right lateral margins of the liver
after placement of percutaneous drainage catheter. There are no new
loculated fluid collections in the abdomen and pelvis.

There is no evidence of intestinal obstruction. There is no
hydronephrosis.

There is loculated pocket of air in the subcutaneous plane
immediately behind the skin staples in the epigastrium, possibly due
to communication with abdominal cavity.

No significant changes are noted in bilateral pleural effusions and
patchy infiltrates in both lower lung fields.

## 2022-04-11 ENCOUNTER — Inpatient Hospital Stay
Admission: EM | Admit: 2022-04-11 | Discharge: 2022-04-13 | DRG: 378 | Disposition: A | Discharge status: Home / Self Care | Payer: BC Managed Care – PPO | Attending: Hospitalist | Admitting: Hospitalist

## 2022-04-11 ENCOUNTER — Other Ambulatory Visit: Payer: Self-pay

## 2022-04-11 DIAGNOSIS — R739 Hyperglycemia, unspecified: Secondary | ICD-10-CM | POA: Diagnosis present

## 2022-04-11 DIAGNOSIS — Z791 Long term (current) use of non-steroidal anti-inflammatories (NSAID): Secondary | ICD-10-CM

## 2022-04-11 DIAGNOSIS — Z811 Family history of alcohol abuse and dependence: Secondary | ICD-10-CM | POA: Diagnosis not present

## 2022-04-11 DIAGNOSIS — F1090 Alcohol use, unspecified, uncomplicated: Secondary | ICD-10-CM | POA: Diagnosis present

## 2022-04-11 DIAGNOSIS — F17210 Nicotine dependence, cigarettes, uncomplicated: Secondary | ICD-10-CM | POA: Diagnosis not present

## 2022-04-11 DIAGNOSIS — D509 Iron deficiency anemia, unspecified: Secondary | ICD-10-CM | POA: Diagnosis present

## 2022-04-11 DIAGNOSIS — D696 Thrombocytopenia, unspecified: Secondary | ICD-10-CM | POA: Diagnosis present

## 2022-04-11 DIAGNOSIS — K275 Chronic or unspecified peptic ulcer, site unspecified, with perforation: Secondary | ICD-10-CM | POA: Diagnosis not present

## 2022-04-11 DIAGNOSIS — K922 Gastrointestinal hemorrhage, unspecified: Secondary | ICD-10-CM

## 2022-04-11 DIAGNOSIS — E538 Deficiency of other specified B group vitamins: Secondary | ICD-10-CM | POA: Diagnosis not present

## 2022-04-11 DIAGNOSIS — K279 Peptic ulcer, site unspecified, unspecified as acute or chronic, without hemorrhage or perforation: Secondary | ICD-10-CM | POA: Diagnosis not present

## 2022-04-11 DIAGNOSIS — D62 Acute posthemorrhagic anemia: Secondary | ICD-10-CM | POA: Diagnosis not present

## 2022-04-11 DIAGNOSIS — E860 Dehydration: Secondary | ICD-10-CM | POA: Diagnosis present

## 2022-04-11 DIAGNOSIS — K222 Esophageal obstruction: Secondary | ICD-10-CM | POA: Diagnosis not present

## 2022-04-11 DIAGNOSIS — Z931 Gastrostomy status: Secondary | ICD-10-CM | POA: Diagnosis not present

## 2022-04-11 DIAGNOSIS — Z8261 Family history of arthritis: Secondary | ICD-10-CM

## 2022-04-11 DIAGNOSIS — Z79899 Other long term (current) drug therapy: Secondary | ICD-10-CM | POA: Diagnosis not present

## 2022-04-11 DIAGNOSIS — I10 Essential (primary) hypertension: Secondary | ICD-10-CM | POA: Diagnosis present

## 2022-04-11 DIAGNOSIS — Z8249 Family history of ischemic heart disease and other diseases of the circulatory system: Secondary | ICD-10-CM | POA: Diagnosis not present

## 2022-04-11 DIAGNOSIS — E440 Moderate protein-calorie malnutrition: Secondary | ICD-10-CM | POA: Diagnosis not present

## 2022-04-11 DIAGNOSIS — I959 Hypotension, unspecified: Secondary | ICD-10-CM | POA: Diagnosis present

## 2022-04-11 DIAGNOSIS — F1010 Alcohol abuse, uncomplicated: Secondary | ICD-10-CM | POA: Diagnosis present

## 2022-04-11 DIAGNOSIS — J449 Chronic obstructive pulmonary disease, unspecified: Secondary | ICD-10-CM | POA: Diagnosis not present

## 2022-04-11 DIAGNOSIS — Z90710 Acquired absence of both cervix and uterus: Secondary | ICD-10-CM | POA: Diagnosis not present

## 2022-04-11 DIAGNOSIS — Z803 Family history of malignant neoplasm of breast: Secondary | ICD-10-CM

## 2022-04-11 DIAGNOSIS — K2100 Gastro-esophageal reflux disease with esophagitis, without bleeding: Secondary | ICD-10-CM | POA: Diagnosis present

## 2022-04-11 DIAGNOSIS — R000 Tachycardia, unspecified: Secondary | ICD-10-CM | POA: Diagnosis not present

## 2022-04-11 DIAGNOSIS — D649 Anemia, unspecified: Secondary | ICD-10-CM | POA: Diagnosis present

## 2022-04-11 DIAGNOSIS — K449 Diaphragmatic hernia without obstruction or gangrene: Secondary | ICD-10-CM | POA: Diagnosis present

## 2022-04-11 DIAGNOSIS — K264 Chronic or unspecified duodenal ulcer with hemorrhage: Principal | ICD-10-CM | POA: Diagnosis present

## 2022-04-11 DIAGNOSIS — D500 Iron deficiency anemia secondary to blood loss (chronic): Secondary | ICD-10-CM

## 2022-04-11 HISTORY — DX: Gastrointestinal hemorrhage, unspecified: K92.2

## 2022-04-11 HISTORY — DX: Peptic ulcer, site unspecified, unspecified as acute or chronic, without hemorrhage or perforation: K27.9

## 2022-04-11 LAB — CBC
HCT: 31 % — ABNORMAL LOW (ref 36.0–46.0)
Hemoglobin: 10.4 g/dL — ABNORMAL LOW (ref 12.0–15.0)
MCH: 34.2 pg — ABNORMAL HIGH (ref 26.0–34.0)
MCHC: 33.5 g/dL (ref 30.0–36.0)
MCV: 102 fL — ABNORMAL HIGH (ref 80.0–100.0)
Platelets: 237 10*3/uL (ref 150–400)
RBC: 3.04 MIL/uL — ABNORMAL LOW (ref 3.87–5.11)
RDW: 13 % (ref 11.5–15.5)
WBC: 11 10*3/uL — ABNORMAL HIGH (ref 4.0–10.5)
nRBC: 0 % (ref 0.0–0.2)

## 2022-04-11 LAB — COMPREHENSIVE METABOLIC PANEL
ALT: 11 U/L (ref 0–44)
AST: 21 U/L (ref 15–41)
Albumin: 4 g/dL (ref 3.5–5.0)
Alkaline Phosphatase: 85 U/L (ref 38–126)
Anion gap: 9 (ref 5–15)
BUN: 49 mg/dL — ABNORMAL HIGH (ref 6–20)
CO2: 24 mmol/L (ref 22–32)
Calcium: 9.7 mg/dL (ref 8.9–10.3)
Chloride: 106 mmol/L (ref 98–111)
Creatinine, Ser: 0.81 mg/dL (ref 0.44–1.00)
GFR, Estimated: >60 mL/min (ref >60)
Glucose, Bld: 191 mg/dL — ABNORMAL HIGH (ref 70–99)
Potassium: 4.3 mmol/L (ref 3.5–5.1)
Sodium: 139 mmol/L (ref 135–145)
Total Bilirubin: 1 mg/dL (ref 0.3–1.2)
Total Protein: 6.9 g/dL (ref 6.5–8.1)

## 2022-04-11 LAB — MAGNESIUM: Magnesium: 1.6 mg/dL — ABNORMAL LOW (ref 1.7–2.4)

## 2022-04-11 LAB — LIPASE, BLOOD: Lipase: 22 U/L (ref 11–51)

## 2022-04-11 LAB — POC URINE PREG, ED: Preg Test, Ur: NEGATIVE

## 2022-04-11 MED ORDER — LACTATED RINGERS IV BOLUS
1000.0000 mL | Freq: Once | INTRAVENOUS | Status: AC
  Administered 2022-04-11: 1000 mL via INTRAVENOUS

## 2022-04-11 MED ORDER — MAGNESIUM SULFATE 2 GM/50ML IV SOLN
2.0000 g | Freq: Once | INTRAVENOUS | Status: AC
  Administered 2022-04-11: 2 g via INTRAVENOUS
  Filled 2022-04-11: qty 50

## 2022-04-11 MED ORDER — PANTOPRAZOLE SODIUM 40 MG IV SOLR
40.0000 mg | Freq: Two times a day (BID) | INTRAVENOUS | Status: DC
  Administered 2022-04-12 – 2022-04-13 (×3): 40 mg via INTRAVENOUS
  Filled 2022-04-11 (×4): qty 10

## 2022-04-11 MED ORDER — LORAZEPAM 1 MG PO TABS: 1.0000 mg | ORAL_TABLET | ORAL | Status: DC | PRN

## 2022-04-11 MED ORDER — LORAZEPAM 2 MG/ML IJ SOLN: 1.0000 mg | INTRAMUSCULAR | Status: DC | PRN

## 2022-04-11 MED ORDER — LACTATED RINGERS IV SOLN: INTRAVENOUS | Status: AC

## 2022-04-11 MED ORDER — PANTOPRAZOLE SODIUM 40 MG IV SOLR: 40.0000 mg | Freq: Two times a day (BID) | INTRAVENOUS | Status: DC

## 2022-04-11 MED ORDER — FOLIC ACID 5 MG/ML IJ SOLN
1.0000 mg | Freq: Every day | INTRAMUSCULAR | Status: DC
  Administered 2022-04-11 – 2022-04-13 (×3): 1 mg via INTRAVENOUS
  Filled 2022-04-11 (×3): qty 0.2

## 2022-04-11 MED ORDER — HYDROMORPHONE HCL 1 MG/ML IJ SOLN
0.5000 mg | INTRAMUSCULAR | Status: DC | PRN
  Administered 2022-04-11 – 2022-04-12 (×3): 0.5 mg via INTRAVENOUS
  Filled 2022-04-11 (×3): qty 0.5

## 2022-04-11 MED ORDER — ONDANSETRON HCL 4 MG PO TABS: 4.0000 mg | ORAL_TABLET | Freq: Four times a day (QID) | ORAL | Status: DC | PRN

## 2022-04-11 MED ORDER — THIAMINE HCL 100 MG/ML IJ SOLN
100.0000 mg | Freq: Every day | INTRAMUSCULAR | Status: DC
  Administered 2022-04-11 – 2022-04-13 (×3): 100 mg via INTRAVENOUS
  Filled 2022-04-11 (×4): qty 2

## 2022-04-11 MED ORDER — SODIUM CHLORIDE 0.9% FLUSH
3.0000 mL | Freq: Two times a day (BID) | INTRAVENOUS | Status: DC
  Administered 2022-04-12 – 2022-04-13 (×3): 3 mL via INTRAVENOUS

## 2022-04-11 MED ORDER — PANTOPRAZOLE INFUSION (NEW) - SIMPLE MED
8.0000 mg/h | INTRAVENOUS | Status: DC
  Filled 2022-04-11: qty 100

## 2022-04-11 MED ORDER — PANTOPRAZOLE 80MG IVPB - SIMPLE MED
80.0000 mg | Freq: Once | INTRAVENOUS | Status: AC
  Administered 2022-04-11: 80 mg via INTRAVENOUS
  Filled 2022-04-11 (×2): qty 100

## 2022-04-11 MED ORDER — ONDANSETRON HCL 4 MG/2ML IJ SOLN: 4.0000 mg | Freq: Four times a day (QID) | INTRAMUSCULAR | Status: DC | PRN

## 2022-04-11 NOTE — Assessment & Plan Note (Addendum)
13-monthhistory of peptic ulcer disease in the setting of NSAID use and daily alcohol use.  Patient has not been taking any NSAID but has continued to drink daily, although she has cut down.   --see above for management

## 2022-04-11 NOTE — Assessment & Plan Note (Signed)
Patient with hyperglycemia on admission likely secondary to acute stress response.  No prior history of diabetes.  A1c in March 2023 within normal limits at 5.6.  -Hold off on SSI - CBG monitoring every a.m.

## 2022-04-11 NOTE — H&P (Addendum)
History and Physical    Patient: Maria [name] GYJ:[id number] DOB: 04-Apr-1971 DOA: 04/11/2022 DOS: the patient was seen and examined on 04/11/2022 PCP: [name], NP  Patient coming from: Home  Chief Complaint:  Chief Complaint  Patient presents with   Hematemesis   Rectal Bleeding   HPI: Maria [name] is a 51 y.o. female with medical history significant of perforated PUD (08/2021), acute blood loss anemia, alcohol use disorder, pancreatitis, vitamin B12 deficiency, hypertension, presenting to the ED with epigastric abdominal pain, hematemesis and melena.  Maria [name] states she was not feeling well yesterday and was experiencing nausea with generalized weakness.  She had a busy and stressful day at work and was unable to eat much.  Last night, she went home and she had 1 mixed alcoholic drink.  This morning around 5 AM, she woke up with sudden nausea, epigastric abdominal pain and had an episode of hematemesis and shortly after an episode of melena.  She states that in the past 12 hours, she has had 3 episodes of hematemesis and 3 episodes of melena.  She describes her emesis as black with specks of bright red blood.  She describes her stool as black but with bright red blood per rectum.  She denies any fever, chills, cough, shortness of breath, palpitations, vision changes.  She does endorse dizziness that has made it difficult to move around today.  Recent admission for similar symptoms from 9/26 to 9/27.  EGD performed during this admission discovered a new nonbleeding gastric ulcer with esophagitis and erythematous duodenopathy.  Maria [name] states she has been working to decrease her daily alcohol use.  Prior to her perforated ulcer in March 2023, she was drinking approximately 1/5 of liquor per day.  She is now drinking only 1 mixed drink per day.  She is very motivated to quit and is open to cessation tools, including naltrexone.  ED course: On arrival to the ED, patient  was tachycardic up to 141 with blood pressure 98/68.  She was saturating at 100% on room air.  Initial blood work demonstrated BUN of 49, white blood count of 11, hemoglobin of 10.4.  TRH contacted for admission for evaluation of upper GI bleed.  Review of Systems: As mentioned in the history of present illness. All other systems reviewed and are negative.  Past Medical History:  Diagnosis Date   Acute postoperative anemia due to expected blood loss 09/19/2021   Anemia    Anesthesia complication, initial encounter 09/13/2021   Difficulty waking up from Anesthesia requiring re-intubation   Ankle edema, bilateral 11/15/2021   Anxiety    h/o   Depression    h/o   Family history of adverse reaction to anesthesia    mom-hard time waking up   Folate deficiency 07/30/2019   Gastritis 08/19/2020   GERD (gastroesophageal reflux disease)    tums prn   Hypertension    WAS PUT ON BP MED BY PCP LAST YEAR (2018) AND BP MED MADE PT SICK SO SHE STOPPED TAKING IT-PCP MONITORS BP NOW   Palpitations 05/18/2020   Pancreatitis    Perforated peptic ulcer ([place]) 09/14/2021   Poison ivy dermatitis 11/13/2019   Vitamin B12 deficiency 01/17/2018   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   Past Surgical History:  Procedure Laterality Date   ANTERIOR AND POSTERIOR REPAIR WITH SACROSPINOUS FIXATION N/A 03/13/2018   Procedure: ANTERIOR REPAIR;  Surgeon: [name], MD;  Location: [facility];  Service: Gynecology;  Laterality: N/A;  ESOPHAGOGASTRODUODENOSCOPY (EGD) WITH PROPOFOL N/A 03/27/2022   Procedure: ESOPHAGOGASTRODUODENOSCOPY (EGD) WITH PROPOFOL;  Surgeon: Lesly Rubenstein, MD;  Location: [facility];  Service: Endoscopy;  Laterality: N/A;   INDUCED ABORTION     x2   IR CM INJ ANY COLONIC TUBE W/FLUORO  09/29/2021   LAPAROTOMY N/A 09/13/2021   Procedure: EXPLORATORY LAPAROTOMY-[place] PATCH repair;  Surgeon: [name], DO;  Location: [facility];  Service: General;  Laterality: N/A;   LAPAROTOMY N/A  09/17/2021   Procedure: EXPLORATORY LAPAROTOMY;  Surgeon: [name], MD;  Location: [facility];  Service: General;  Laterality: N/A;   REPAIR OF PERFORATED ULCER N/A 09/17/2021   Procedure: REPAIR OF PERFORATED ULCER;  Surgeon: [name], MD;  Location: [facility];  Service: General;  Laterality: N/A;   VAGINAL HYSTERECTOMY N/A 03/13/2018   Procedure: HYSTERECTOMY VAGINAL;  Surgeon: [name], MD;  Location: [facility];  Service: Gynecology;  Laterality: N/A;   Social History:  reports that she has been smoking cigarettes. She has a 20.00 pack-year smoking history. She has never used smokeless tobacco. She reports current alcohol use of about 2.0 standard drinks of alcohol per week. She reports that she does not use drugs.  Allergies  Allergen Reactions   Anesthesia S-I-40 [Propofol]    Bee Venom Swelling   Bupropion Anxiety and Other (See Comments)    Worsened anxiety   Family History  Problem Relation Age of Onset   Arthritis Mother    Arthritis Father    Alcohol abuse Father    Hypertension Father    Heart disease Father    Aneurysm Father    Breast cancer Maternal Grandmother    Breast cancer Paternal Grandmother     Prior to Admission medications   Medication Sig Start Date End Date Taking? Authorizing Provider  amLODipine (NORVASC) 5 MG tablet Take 1 tablet (5 mg total) by mouth daily. for blood pressure. 03/30/22  Yes [name], NP  EPINEPHrine 0.3 mg/0.3 mL IJ SOAJ injection Inject 0.3 mLs (0.3 mg total) into the muscle as needed for anaphylaxis. Patient not taking: Reported on 03/30/2022 11/11/18   [name], NP  ondansetron (ZOFRAN) 4 MG tablet Take 4 mg by mouth daily as needed. Patient not taking: Reported on 03/30/2022 03/17/22   [provider]  pantoprazole (PROTONIX) 40 MG tablet Take 1 tablet (40 mg total) by mouth daily. 03/28/22 03/28/23  Shawna Clamp, MD    Physical Exam: Vitals:   04/11/22 1449 04/11/22 1517  BP: 98/68    Pulse: (!) 141 (!) 122  Resp: 18   Temp: 97.6 F (36.4 C)   TempSrc: Oral   SpO2: 100%   Weight:  52.6 kg  Height:  '5\' 2"'$  (1.575 m)   Physical Exam Vitals and nursing note reviewed.  Constitutional:      General: She is not in acute distress.    Appearance: She is normal weight.  HENT:     Head: Normocephalic and atraumatic.     Mouth/Throat:     Mouth: Mucous membranes are moist.     Pharynx: Oropharynx is clear.  Eyes:     Conjunctiva/sclera: Conjunctivae normal.     Pupils: Pupils are equal, round, and reactive to light.  Cardiovascular:     Rate and Rhythm: Regular rhythm. Tachycardia present.     Heart sounds: No murmur heard.    No gallop.  Pulmonary:     Effort: Pulmonary effort is normal. No respiratory distress.     Breath  sounds: Normal breath sounds. No wheezing, rhonchi or rales.  Abdominal:     General: Bowel sounds are normal.     Palpations: Abdomen is soft.     Tenderness: There is abdominal tenderness in the epigastric area and periumbilical area. There is no guarding.  Musculoskeletal:     Cervical back: Neck supple.     Right lower leg: No edema.     Left lower leg: No edema.  Skin:    General: Skin is warm and dry.     Coloration: Skin is not jaundiced.  Neurological:     General: No focal deficit present.     Mental Status: She is alert and oriented to person, place, and time. Mental status is at baseline.  Psychiatric:        Mood and Affect: Mood normal.        Behavior: Behavior normal.        Thought Content: Thought content normal.        Judgment: Judgment normal.    Data Reviewed: CBC remarkable for white blood count of 11.0, hemoglobin of 10.4, MCV of 102, and platelets of 237.  CMP remarkable for BUN of 49 and a glucose of 191.  Lipase within normal limits at 22.  Magnesium low at 1.6.   EKG personally reviewed.  Sinus rhythm with heart rate of 141.  J-point depression in lead II only.  No ST or T wave changes concerning for acute  ischemia   There are no new results to review at this time.  Assessment and Plan: * Upper GI bleed Patient presenting with 12-hour history of 3 episodes of hematemesis and 3 episodes of melena in the setting of known gastric ulcer and recent history of palpitate ulcer disease.  Most recent endoscopy approximately 2 weeks ago with evidence of esophagitis with nonbleeding gastric ulcer.  Patient is tachycardic with borderline low blood pressure.  Will monitor closely hemoglobin and transfuse if there is any evidence of hemodynamic instability or hemoglobin less than 7.  - Gastroenterology consulted - CBC twice daily - Transfuse for hemoglobin less than 7 - Protonix 40 mg IV twice daily - No indication for octreotide given no evidence of varices on most recent endoscopy  PUD (peptic ulcer disease) 60-monthhistory of peptic ulcer disease in the setting of NSAID use and daily alcohol use.  Patient has not been taking any NSAID but has continued to drink daily, although she has cut down.  Please see management suspect current upper GI bleed is in the setting of PUD.  Please see above for management.  Chronic anemia Patient has a several month history of acute blood loss anemia with hemoglobin today stable.  We will recheck CBC this afternoon to assure stability  -CBC twice daily  Alcohol abuse Patient states that she was previously drinking 1/5 of liquor per day prior to March 2023 but has been since cutting down.  She is currently drinking 1 drink per day.  She is extremely motivated to quit drinking altogether and is willing to try naltrexone.  No evidence of alcohol withdrawal at this time.  Patient is likely tachycardic secondary to dehydration.  -Start naltrexone 50 mg daily prior to discharge - CIWA with as needed Ativan ordered  Essential hypertension Patient has a history of hypertension Per chart review, but is no longer taking any antihypertensives.  Blood pressure today is on the  lower end of normal.  Hyperglycemia Patient with hyperglycemia on admission likely secondary to acute  stress response.  No prior history of diabetes.  A1c in March 2023 within normal limits at 5.6.  -Hold off on SSI - CBG monitoring every a.m.  Hypomagnesemia Magnesium low on admission likely secondary to active alcohol use.  - Magnesium sulfate 2 g - Recheck magnesium in the a.m.   Advance Care Planning:   Code Status: Full Code   Consults: Gastroenterology  Family Communication: No family at bedside  Severity of Illness: The appropriate patient status for this patient is OBSERVATION. Observation status is judged to be reasonable and necessary in order to provide the required intensity of service to ensure the patient's safety. The patient's presenting symptoms, physical exam findings, and initial radiographic and laboratory data in the context of their medical condition is felt to place them at decreased risk for further clinical deterioration. Furthermore, it is anticipated that the patient will be medically stable for discharge from the hospital within 2 midnights of admission.   Author: Jose Persia, MD 04/11/2022 9:00 PM  For on call review [email or web address].

## 2022-04-11 NOTE — ED Provider Notes (Addendum)
[facility] Provider Note    Event Date/Time   First MD Initiated Contact with Patient 04/11/22 1719     (approximate)   History   Hematemesis and Rectal Bleeding   HPI  Maria [name] is a 51 y.o. female past medical history of peptic ulcer disease, alcohol use disorder hypertension who presents with hematemesis and dark stool.  Yesterday patient started to have epigastric pain.  This morning she woke up and had an episode of dark vomit that looked bloody and had an episode of black stool.  She only had 1 episode of stool and has vomited 1 additional time and it was mostly clear.  She has ongoing epigastric discomfort feels lightheaded and dizzy.  Patient's last drink was yesterday around 7 PM.  Tells me she is drinking about 1 drink per day now which is significantly less than she was before.   Patient was last hospitalized last month for hematemesis had an EGD that showed a gastric ulcer.  Note there is no esophageal varices on this last EGD.  He required surgery for perforated ulcer in March 2023.  Patient does report compliance with the pantoprazole.  Denies any ongoing NSAID use.  She does continue to drink alcohol.  Past Medical History:  Diagnosis Date   Acute postoperative anemia due to expected blood loss 09/19/2021   Anemia    Anesthesia complication, initial encounter 09/13/2021   Difficulty waking up from Anesthesia requiring re-intubation   Ankle edema, bilateral 11/15/2021   Anxiety    h/o   Depression    h/o   Family history of adverse reaction to anesthesia    mom-hard time waking up   Folate deficiency 07/30/2019   Gastritis 08/19/2020   GERD (gastroesophageal reflux disease)    tums prn   Hypertension    WAS PUT ON BP MED BY PCP LAST YEAR (2018) AND BP MED MADE PT SICK SO SHE STOPPED TAKING IT-PCP MONITORS BP NOW   Palpitations 05/18/2020   Pancreatitis    Perforated peptic ulcer ([place]) 09/14/2021   Poison ivy dermatitis 11/13/2019    Vitamin B12 deficiency 01/17/2018   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    Patient Active Problem List   Diagnosis Date Noted   Upper GI bleeding 03/27/2022   Vaginal itching 02/08/2022   Chronic anemia 11/02/2021   AKI (acute kidney injury) ([place]) 09/18/2021   Alcohol abuse 09/18/2021   Malnutrition of moderate degree 09/15/2021   PUD (peptic ulcer disease) 09/14/2021   Chronic pancreatitis ([place]) 09/13/2021   Bilious vomiting with nausea 09/06/2021   Back pain 07/23/2021   Vitamin B1 deficiency 05/29/2021   Macrocytosis 05/17/2021   Skin lump of arm, right 05/17/2021   Thrombocytopenia ([place]) 02/05/2021   History of seasonal allergies 05/18/2020   Fatigue 12/09/2019   Depression, recurrent ([place]) 11/13/2019   Vitamin D deficiency 07/30/2019   Hyperglycemia 07/30/2019   Encounter for annual general medical examination with abnormal findings in adult 07/30/2019   Bee sting allergy 11/11/2018   Uterine prolapse 02/28/2018   Right carpal tunnel syndrome 11/13/2017   Essential hypertension 12/24/2016   Peripheral neuropathy 12/24/2016     Physical Exam  Triage Vital Signs: ED Triage Vitals  Enc Vitals Group     BP 04/11/22 1449 98/68     Pulse Rate 04/11/22 1449 (!) 141     Resp 04/11/22 1449 18     Temp 04/11/22 1449 97.6 F (36.4 C)     Temp Source 04/11/22  1449 Oral     SpO2 04/11/22 1449 100 %     Weight 04/11/22 1517 116 lb (52.6 kg)     Height 04/11/22 1517 '5\' 2"'$  (1.575 m)     Head Circumference --      Peak Flow --      Pain Score 04/11/22 1516 8     Pain Loc --      Pain Edu? --      Excl. in [place]? --     Most recent vital signs: Vitals:   04/11/22 1449 04/11/22 1517  BP: 98/68   Pulse: (!) 141 (!) 122  Resp: 18   Temp: 97.6 F (36.4 C)   SpO2: 100%      General: Awake, no distress.  Patient is thin CV:  Good peripheral perfusion.  Tachycardic Resp:  Normal effort.  Abd:  No distention.  Minimal tenderness in the epigastric region, no  guarding Neuro:             Awake, Alert, Oriented x 3  Other:  Melena on rectal exam   ED Results / Procedures / Treatments  Labs (all labs ordered are listed, but only abnormal results are displayed) Labs Reviewed  COMPREHENSIVE METABOLIC PANEL - Abnormal; Notable for the following components:      Result Value   Glucose, Bld 191 (*)    BUN 49 (*)    All other components within normal limits  CBC - Abnormal; Notable for the following components:   WBC 11.0 (*)    RBC 3.04 (*)    Hemoglobin 10.4 (*)    HCT 31.0 (*)    MCV 102.0 (*)    MCH 34.2 (*)    All other components within normal limits  LIPASE, BLOOD  MAGNESIUM  POC OCCULT BLOOD, ED  POC URINE PREG, ED  TYPE AND SCREEN     EKG  EKG reviewed interpreted by myself shows sinus tachycardia with prolonged QT interval diffuse ST depression   RADIOLOGY    PROCEDURES:  Critical Care performed: Yes, see critical care procedure note(s)  .Critical Care  Performed by: Rada Hay, MD Authorized by: Rada Hay, MD   Critical care provider statement:    Critical care time (minutes):  30   Critical care was time spent personally by me on the following activities:  Development of treatment plan with patient or surrogate, discussions with consultants, evaluation of patient's response to treatment, examination of patient, ordering and review of laboratory studies, ordering and review of radiographic studies, ordering and performing treatments and interventions, pulse oximetry, re-evaluation of patient's condition and review of old charts   The patient is on the cardiac monitor to evaluate for evidence of arrhythmia and/or significant heart rate changes.   MEDICATIONS ORDERED IN ED: Medications  pantoprazole (PROTONIX) 80 mg /NS 100 mL IVPB (has no administration in time range)  pantoprozole (PROTONIX) 80 mg /NS 100 mL infusion (has no administration in time range)  pantoprazole (PROTONIX) injection 40 mg  (has no administration in time range)  lactated ringers bolus 1,000 mL (has no administration in time range)     IMPRESSION / MDM / ASSESSMENT AND PLAN / ED COURSE  I reviewed the triage vital signs and the nursing notes.                              Patient's presentation is most consistent with acute presentation with potential threat to  life or bodily function.  Differential diagnosis includes, but is not limited to, upper GI bleed secondary to gastric ulcer, Mallory-Weiss tear, gastritis, esophagitis, esophageal varices   Patient is a 50 year old female with history of alcohol use disorder and peptic ulcer disease who presents with hematemesis and dark stool.  Symptoms started this morning.  She has had 2 episodes of emesis the first of this morning that looked dark and bloody to her and 1 black tarry bowel movement.  Second episode of emesis was this afternoon and was rather clear per report.  She has epigastric pain.  She does continue to drink alcohol but less than she was before denies any ongoing NSAID use and is using Protonix as directed.  She was last admitted at the end of last month had an EGD that showed nonbleeding gastric ulcer.  She is tachycardic blood pressure is on the low side.  CMP shows elevated BUN with normal creatinine is concerning for upper GI bleed.  Hemoglobin is not far from baseline at 10.4.  On exam abdomen is benign she does have melena.  Patient will require admission for treatment of upper GI bleed.  We will start Protonix.  Will defer octreotide at this time as she had no varices on prior EGD just done about 2 weeks ago.  We will give a fluid bolus.  Tachycardia may be partially driven by alcohol withdrawal we will see how she responds to the fluids and placed on CIWA.      FINAL CLINICAL IMPRESSION(S) / ED DIAGNOSES   Final diagnoses:  Upper GI bleeding     Rx / DC Orders   ED Discharge Orders     None        Note:  This document was  prepared using Dragon voice recognition software and may include unintentional dictation errors.   Rada Hay, MD 04/11/22 1811    Rada Hay, MD 04/11/22 [phone]

## 2022-04-11 NOTE — Assessment & Plan Note (Addendum)
Patient states that she was previously drinking 1/5 of liquor per day prior to March 2023 but has been since cutting down.  She is currently drinking 1 drink per day.  She is extremely motivated to quit drinking altogether and is willing to try naltrexone.  No evidence of alcohol withdrawal at this time.  Patient is likely tachycardic secondary to dehydration.  -Start naltrexone 50 mg daily prior to discharge - CIWA with as needed Ativan ordered

## 2022-04-11 NOTE — ED Provider Triage Note (Signed)
  Emergency Medicine Provider Triage Evaluation Note  Maria [name] , a 51 y.o.female,  was evaluated in triage.  Pt complains of hematemesis and rectal bleeding.  Patient states that she had a upset stomach last night and then this morning began vomiting coffee-ground emesis.  She additionally had dark stools.  She was diagnosed with peptic ulcer disease back in March 2023 with 2 surgeries to repair perforated ulcer since then.  Currently nursing dizziness and nausea.   Review of Systems  Positive: Hematemesis, rectal bleeding, dizziness, nausea Negative: Denies fever, chest pain, back pain  Physical Exam   Vitals:   04/11/22 1449 04/11/22 1517  BP: 98/68   Pulse: (!) 141 (!) 122  Resp: 18   Temp: 97.6 F (36.4 C)   SpO2: 100%    Gen:   Awake, no distress   Resp:  Normal effort  MSK:   Moves extremities without difficulty  Other:    Medical Decision Making  Given the patient's initial medical screening exam, the following diagnostic evaluation has been ordered. The patient will be placed in the appropriate treatment space, once one is available, to complete the evaluation and treatment. I have discussed the plan of care with the patient and I have advised the patient that an ED physician or mid-level practitioner will reevaluate their condition after the test results have been received, as the results may give them additional insight into the type of treatment they may need.    Diagnostics: Labs, type and screen, EKG  Treatments: none immediately   Teodoro Spray, [place] 04/11/22 1556

## 2022-04-11 NOTE — ED Triage Notes (Signed)
Pt to ED with husband from home. Had upset stomach last night, then this AM began vomiting coffee ground to black colored emesis and also with dark stools (not well formed, with black color). Diagnosed with PUD 3/23 with 2 surgeries to repair perforated ulcers since then.  Feels dizzy and nauseous. Pulse is 122.

## 2022-04-11 NOTE — Assessment & Plan Note (Signed)
Patient has a history of hypertension Per chart review, but is no longer taking any antihypertensives.  Blood pressure today is on the lower end of normal.

## 2022-04-11 NOTE — Assessment & Plan Note (Addendum)
Patient presenting with 12-hour history of 3 episodes of hematemesis and 3 episodes of melena in the setting of known gastric ulcer and recent history of palpitate ulcer disease.  Most recent endoscopy approximately 2 weeks ago with evidence of esophagitis with nonbleeding gastric ulcer.  Patient is tachycardic with borderline low blood pressure.   Plan: --1u pRBC today for Hgb 7.1 --EGD today, found large clean-based ulcer in the duodenal bulb --will discharge on Omeprazole 40 BID for 3 months --outpatient f/u with Dr. Marius Ditch

## 2022-04-11 NOTE — Assessment & Plan Note (Addendum)
--  replete PRN

## 2022-04-11 NOTE — Assessment & Plan Note (Addendum)
Patient has a several month history of acute blood loss anemia 2/2 GI bleed. Plan: --1u pRBC today for Hgb 7.1

## 2022-04-12 ENCOUNTER — Encounter
Admission: EM | Disposition: A | Discharge status: Home / Self Care | Payer: Self-pay | Source: Home / Self Care | Attending: Hospitalist

## 2022-04-12 ENCOUNTER — Observation Stay: Payer: BC Managed Care – PPO | Admitting: Anesthesiology

## 2022-04-12 ENCOUNTER — Ambulatory Visit: Payer: BC Managed Care – PPO | Admitting: Primary Care

## 2022-04-12 ENCOUNTER — Encounter: Payer: Self-pay | Admitting: Internal Medicine

## 2022-04-12 DIAGNOSIS — F17210 Nicotine dependence, cigarettes, uncomplicated: Secondary | ICD-10-CM | POA: Diagnosis present

## 2022-04-12 DIAGNOSIS — K922 Gastrointestinal hemorrhage, unspecified: Secondary | ICD-10-CM | POA: Diagnosis present

## 2022-04-12 DIAGNOSIS — K2100 Gastro-esophageal reflux disease with esophagitis, without bleeding: Secondary | ICD-10-CM | POA: Diagnosis present

## 2022-04-12 DIAGNOSIS — Z791 Long term (current) use of non-steroidal anti-inflammatories (NSAID): Secondary | ICD-10-CM | POA: Diagnosis not present

## 2022-04-12 DIAGNOSIS — Z803 Family history of malignant neoplasm of breast: Secondary | ICD-10-CM | POA: Diagnosis not present

## 2022-04-12 DIAGNOSIS — K279 Peptic ulcer, site unspecified, unspecified as acute or chronic, without hemorrhage or perforation: Secondary | ICD-10-CM

## 2022-04-12 DIAGNOSIS — Z90710 Acquired absence of both cervix and uterus: Secondary | ICD-10-CM | POA: Diagnosis not present

## 2022-04-12 DIAGNOSIS — I959 Hypotension, unspecified: Secondary | ICD-10-CM | POA: Diagnosis present

## 2022-04-12 DIAGNOSIS — Z8249 Family history of ischemic heart disease and other diseases of the circulatory system: Secondary | ICD-10-CM | POA: Diagnosis not present

## 2022-04-12 DIAGNOSIS — Z811 Family history of alcohol abuse and dependence: Secondary | ICD-10-CM | POA: Diagnosis not present

## 2022-04-12 DIAGNOSIS — D62 Acute posthemorrhagic anemia: Secondary | ICD-10-CM | POA: Diagnosis present

## 2022-04-12 DIAGNOSIS — D696 Thrombocytopenia, unspecified: Secondary | ICD-10-CM | POA: Diagnosis present

## 2022-04-12 DIAGNOSIS — K264 Chronic or unspecified duodenal ulcer with hemorrhage: Secondary | ICD-10-CM | POA: Diagnosis present

## 2022-04-12 DIAGNOSIS — F1010 Alcohol abuse, uncomplicated: Secondary | ICD-10-CM | POA: Diagnosis present

## 2022-04-12 DIAGNOSIS — E860 Dehydration: Secondary | ICD-10-CM | POA: Diagnosis present

## 2022-04-12 DIAGNOSIS — E538 Deficiency of other specified B group vitamins: Secondary | ICD-10-CM | POA: Diagnosis present

## 2022-04-12 DIAGNOSIS — K449 Diaphragmatic hernia without obstruction or gangrene: Secondary | ICD-10-CM | POA: Diagnosis present

## 2022-04-12 DIAGNOSIS — D509 Iron deficiency anemia, unspecified: Secondary | ICD-10-CM | POA: Diagnosis present

## 2022-04-12 DIAGNOSIS — I10 Essential (primary) hypertension: Secondary | ICD-10-CM | POA: Diagnosis present

## 2022-04-12 DIAGNOSIS — Z8261 Family history of arthritis: Secondary | ICD-10-CM | POA: Diagnosis not present

## 2022-04-12 DIAGNOSIS — K222 Esophageal obstruction: Secondary | ICD-10-CM | POA: Diagnosis present

## 2022-04-12 DIAGNOSIS — Z79899 Other long term (current) drug therapy: Secondary | ICD-10-CM | POA: Diagnosis not present

## 2022-04-12 DIAGNOSIS — R739 Hyperglycemia, unspecified: Secondary | ICD-10-CM | POA: Diagnosis present

## 2022-04-12 HISTORY — PX: ESOPHAGOGASTRODUODENOSCOPY (EGD) WITH PROPOFOL: SHX5813

## 2022-04-12 LAB — BASIC METABOLIC PANEL
Anion gap: 6 (ref 5–15)
BUN: 41 mg/dL — ABNORMAL HIGH (ref 6–20)
CO2: 25 mmol/L (ref 22–32)
Calcium: 8.8 mg/dL — ABNORMAL LOW (ref 8.9–10.3)
Chloride: 109 mmol/L (ref 98–111)
Creatinine, Ser: 0.72 mg/dL (ref 0.44–1.00)
GFR, Estimated: >60 mL/min (ref >60)
Glucose, Bld: 97 mg/dL (ref 70–99)
Potassium: 3.5 mmol/L (ref 3.5–5.1)
Sodium: 140 mmol/L (ref 135–145)

## 2022-04-12 LAB — CBC
HCT: 21.2 % — ABNORMAL LOW (ref 36.0–46.0)
HCT: 22.6 % — ABNORMAL LOW (ref 36.0–46.0)
Hemoglobin: 7.1 g/dL — ABNORMAL LOW (ref 12.0–15.0)
Hemoglobin: 7.6 g/dL — ABNORMAL LOW (ref 12.0–15.0)
MCH: 34.4 pg — ABNORMAL HIGH (ref 26.0–34.0)
MCH: 34.5 pg — ABNORMAL HIGH (ref 26.0–34.0)
MCHC: 33.5 g/dL (ref 30.0–36.0)
MCHC: 33.6 g/dL (ref 30.0–36.0)
MCV: 102.3 fL — ABNORMAL HIGH (ref 80.0–100.0)
MCV: 102.9 fL — ABNORMAL HIGH (ref 80.0–100.0)
Platelets: 124 10*3/uL — ABNORMAL LOW (ref 150–400)
Platelets: 126 10*3/uL — ABNORMAL LOW (ref 150–400)
RBC: 2.06 MIL/uL — ABNORMAL LOW (ref 3.87–5.11)
RBC: 2.21 MIL/uL — ABNORMAL LOW (ref 3.87–5.11)
RDW: 12.9 % (ref 11.5–15.5)
RDW: 13.1 % (ref 11.5–15.5)
WBC: 5.6 10*3/uL (ref 4.0–10.5)
WBC: 6.7 10*3/uL (ref 4.0–10.5)
nRBC: 0 % (ref 0.0–0.2)
nRBC: 0 % (ref 0.0–0.2)

## 2022-04-12 LAB — IRON AND TIBC
Iron: 106 ug/dL (ref 28–170)
Saturation Ratios: 49 % — ABNORMAL HIGH (ref 10.4–31.8)
TIBC: 217 ug/dL — ABNORMAL LOW (ref 250–450)
UIBC: 111 ug/dL

## 2022-04-12 LAB — FOLATE: Folate: 12.4 ng/mL (ref >5.9)

## 2022-04-12 LAB — PREPARE RBC (CROSSMATCH)

## 2022-04-12 LAB — MAGNESIUM: Magnesium: 2.1 mg/dL (ref 1.7–2.4)

## 2022-04-12 LAB — VITAMIN B12: Vitamin B-12: 315 pg/mL (ref 180–914)

## 2022-04-12 LAB — FERRITIN: Ferritin: 73 ng/mL (ref 11–307)

## 2022-04-12 SURGERY — ESOPHAGOGASTRODUODENOSCOPY (EGD) WITH PROPOFOL
Anesthesia: General

## 2022-04-12 MED ORDER — DEXMEDETOMIDINE HCL IN NACL 80 MCG/20ML IV SOLN
INTRAVENOUS | Status: DC | PRN
  Administered 2022-04-12: 8 ug via BUCCAL

## 2022-04-12 MED ORDER — SODIUM CHLORIDE 0.9% IV SOLUTION
Freq: Once | INTRAVENOUS | Status: AC
  Filled 2022-04-12: qty 250

## 2022-04-12 MED ORDER — BOOST / RESOURCE BREEZE PO LIQD CUSTOM
1.0000 | Freq: Three times a day (TID) | ORAL | Status: DC
  Administered 2022-04-12 – 2022-04-13 (×3): 1 via ORAL

## 2022-04-12 MED ORDER — LIDOCAINE HCL (CARDIAC) PF 100 MG/5ML IV SOSY
PREFILLED_SYRINGE | INTRAVENOUS | Status: DC | PRN
  Administered 2022-04-12: 50 mg via INTRAVENOUS

## 2022-04-12 MED ORDER — HYDROCODONE-ACETAMINOPHEN 5-325 MG PO TABS
1.0000 | ORAL_TABLET | Freq: Four times a day (QID) | ORAL | Status: DC | PRN
  Administered 2022-04-12 – 2022-04-13 (×3): 1 via ORAL
  Filled 2022-04-12 (×3): qty 1

## 2022-04-12 MED ORDER — PROPOFOL 500 MG/50ML IV EMUL
INTRAVENOUS | Status: DC | PRN
  Administered 2022-04-12: 150 ug/kg/min via INTRAVENOUS

## 2022-04-12 MED ORDER — SODIUM CHLORIDE 0.9 % IV SOLN: INTRAVENOUS | Status: DC

## 2022-04-12 MED ORDER — PROPOFOL 10 MG/ML IV BOLUS
INTRAVENOUS | Status: DC | PRN
  Administered 2022-04-12: 40 mg via INTRAVENOUS
  Administered 2022-04-12 (×2): 50 mg via INTRAVENOUS
  Administered 2022-04-12: 60 mg via INTRAVENOUS

## 2022-04-12 NOTE — ED Notes (Signed)
No active bleeding noted since this am   Has been up to the B/R  denies any complaints at present

## 2022-04-12 NOTE — Op Note (Signed)
[facility] Patient Name: Maria [name] Procedure Date: 04/12/2022 2:46 PM MRN: [id number] Account #: [id number] Date of Birth: October 01, 1970 Admit Type: Inpatient Age: 51 Room: [facility] ENDO ROOM 3 Gender: Female Note Status: Finalized Instrument Name: Upper Endoscope [id number] Procedure:             Upper GI endoscopy Indications:           Epigastric abdominal pain, Acute post hemorrhagic                         anemia, Coffee-ground emesis, Melena, Exclusion of                         acute peptic ulcer with hemorrhage, Follow-up of acute                         peptic ulcer with hemorrhage Providers:             Lin Landsman MD, MD Referring MD:          No Local Md, MD (Referring MD) Medicines:             General Anesthesia Complications:         No immediate complications. Estimated blood loss: None. Procedure:             Pre-Anesthesia Assessment:                        - Prior to the procedure, a History and Physical was                         performed, and patient medications and allergies were                         reviewed. The patient is competent. The risks and                         benefits of the procedure and the sedation options and                         risks were discussed with the patient. All questions                         were answered and informed consent was obtained.                         Patient identification and proposed procedure were                         verified by the physician, the nurse, the                         anesthesiologist, the anesthetist and the technician                         in the pre-procedure area in the procedure room in the                         endoscopy suite. Mental Status Examination: alert and  oriented. Airway Examination: normal oropharyngeal                         airway and neck mobility. Respiratory Examination:                         clear  to auscultation. CV Examination: normal.                         Prophylactic Antibiotics: The patient does not require                         prophylactic antibiotics. Prior Anticoagulants: The                         patient has taken no previous anticoagulant or                         antiplatelet agents. ASA Grade Assessment: III - A                         patient with severe systemic disease. After reviewing                         the risks and benefits, the patient was deemed in                         satisfactory condition to undergo the procedure. The                         anesthesia plan was to use general anesthesia.                         Immediately prior to administration of medications,                         the patient was re-assessed for adequacy to receive                         sedatives. The heart rate, respiratory rate, oxygen                         saturations, blood pressure, adequacy of pulmonary                         ventilation, and response to care were monitored                         throughout the procedure. The physical status of the                         patient was re-assessed after the procedure.                        After obtaining informed consent, the endoscope was                         passed under direct vision. Throughout the procedure,  the patient's blood pressure, pulse, and oxygen                         saturations were monitored continuously. The Endoscope                         was introduced through the mouth, and advanced to the                         second part of duodenum. The upper GI endoscopy was                         accomplished without difficulty. The patient tolerated                         the procedure well. Findings:      One non-bleeding cratered duodenal ulcer with a clean ulcer base       (Forrest Class III) was found in the duodenal bulb. The lesion was 20 mm       in largest  dimension.      The second portion of the duodenum was normal.      A 9 mm healed ulcer was found in the prepyloric region of the stomach.      The gastric body, incisura and gastric antrum were normal.      The cardia and gastric fundus were normal on retroflexion.      A widely patent Schatzki ring was found at the gastroesophageal junction.      The gastroesophageal junction and examined esophagus were normal.      A small hiatal hernia was present. Impression:            - Non-bleeding duodenal ulcer with a clean ulcer base                         (Forrest Class III).                        - Normal second portion of the duodenum.                        - Scar in the prepyloric region of the stomach.                        - Normal gastric body, incisura and antrum.                        - Widely patent Schatzki ring.                        - Normal gastroesophageal junction and esophagus.                        - Small hiatal hernia.                        - No specimens collected. Recommendation:        - Return patient to hospital ward for ongoing care.                        - Resume regular  diet today.                        - Continue present medications.                        - Use Prilosec (omeprazole) 40 mg PO BID indefinitely.                        - Return to my office in 1 month.                        - No aspirin, ibuprofen, naproxen, or other                         non-steroidal anti-inflammatory drugs. Procedure Code(s):     --- Professional ---                        [phone], Esophagogastroduodenoscopy, flexible,                         transoral; diagnostic, including collection of                         specimen(s) by brushing or washing, when performed                         (separate procedure) Diagnosis Code(s):     --- Professional ---                        K26.9, Duodenal ulcer, unspecified as acute or                         chronic, without hemorrhage  or perforation                        K31.89, Other diseases of stomach and duodenum                        K22.2, Esophageal obstruction                        K44.9, Diaphragmatic hernia without obstruction or                         gangrene                        D62, Acute posthemorrhagic anemia                        R10.13, Epigastric pain                        K92.1, Melena (includes Hematochezia)                        K92.0, Hematemesis                        K27.0, Acute peptic ulcer, site unspecified, with  hemorrhage CPT copyright 2019 American [employer] Association. All rights reserved. The codes documented in this report are preliminary and upon coder review may  be revised to meet current compliance requirements. Dr. Ulyess Mort Lin Landsman MD, MD 04/12/2022 3:05:21 PM This report has been signed electronically. Number of Addenda: 0 Note Initiated On: 04/12/2022 2:46 PM Estimated Blood Loss:  Estimated blood loss: none.      [facility]

## 2022-04-12 NOTE — ED Notes (Signed)
Pt scheduled for EGD, pt transported to endo

## 2022-04-12 NOTE — Plan of Care (Signed)

## 2022-04-12 NOTE — Anesthesia Procedure Notes (Signed)
Date/Time: 04/12/2022 2:50 PM  Performed by: Johnna Acosta, CRNAPre-anesthesia Checklist: Patient identified, Emergency Drugs available, Suction available, Patient being monitored and Timeout performed Patient Re-evaluated:Patient Re-evaluated prior to induction Oxygen Delivery Method: Nasal cannula Preoxygenation: Pre-oxygenation with 100% oxygen Induction Type: IV induction

## 2022-04-12 NOTE — Transfer of Care (Signed)
Immediate Anesthesia Transfer of Care Note  Patient: SHEKELA GOODRIDGE  Procedure(s) Performed: ESOPHAGOGASTRODUODENOSCOPY (EGD) WITH PROPOFOL  Patient Location: PACU  Anesthesia Type:General  Level of Consciousness: awake, alert  and oriented  Airway & Oxygen Therapy: Patient Spontanous Breathing  Post-op Assessment: Report given to RN and Post -op Vital signs reviewed and stable  Post vital signs: Reviewed and stable  Last Vitals:  Vitals Value Taken Time  BP 101/51 04/12/22 1509  Temp 36.5 C 04/12/22 1509  Pulse 84 04/12/22 1509  Resp 18 04/12/22 1510  SpO2 100 % 04/12/22 1509  Vitals shown include unvalidated device data.  Last Pain:  Vitals:   04/12/22 1509  TempSrc: Temporal  PainSc: Asleep         Complications: No notable events documented.

## 2022-04-12 NOTE — Anesthesia Preprocedure Evaluation (Addendum)
Anesthesia Evaluation  Patient identified by MRN, date of birth, ID band Patient awake    Reviewed: Allergy & Precautions, NPO status , Patient's Chart, lab work & pertinent test results  History of Anesthesia Complications Negative for: history of anesthetic complications  Airway Mallampati: III  TM Distance: >3 FB Neck ROM: full    Dental  (+) Chipped   Pulmonary neg shortness of breath, COPD, Current Smoker and Patient abstained from smoking.,    Pulmonary exam normal        Cardiovascular Exercise Tolerance: Good hypertension, (-) anginaNormal cardiovascular exam     Neuro/Psych  Neuromuscular disease negative psych ROS   GI/Hepatic Neg liver ROS, PUD, GERD  Controlled,  Endo/Other  negative endocrine ROS  Renal/GU Renal disease  negative genitourinary   Musculoskeletal   Abdominal   Peds  Hematology negative hematology ROS (+)   Anesthesia Other Findings Patient is NPO appropriate and reports no nausea or vomiting today.   Past Medical History: 09/19/2021: Acute postoperative anemia due to expected blood loss No date: Anemia 09/13/2021: Anesthesia complication, initial encounter     Comment:  Difficulty waking up from Anesthesia requiring               re-intubation 11/15/2021: Ankle edema, bilateral No date: Anxiety     Comment:  h/o No date: Depression     Comment:  h/o No date: Family history of adverse reaction to anesthesia     Comment:  mom-hard time waking up 07/30/2019: Folate deficiency 08/19/2020: Gastritis No date: GERD (gastroesophageal reflux disease)     Comment:  tums prn No date: Hypertension     Comment:  WAS PUT ON BP MED BY PCP LAST YEAR (2018) AND BP MED               MADE PT SICK SO SHE STOPPED TAKING IT-PCP MONITORS BP NOW 05/18/2020: Palpitations No date: Pancreatitis No date: Peptic ulcer 09/14/2021: Perforated peptic ulcer ([place]) 11/13/2019: Poison ivy  dermatitis 01/17/2018: Vitamin B12 deficiency     Comment:  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  Past Surgical History: 03/13/2018: ANTERIOR AND POSTERIOR REPAIR WITH SACROSPINOUS FIXATION;  N/A     Comment:  Procedure: ANTERIOR REPAIR;  Surgeon: [name],               MD;  Location: [facility] OR[facility];  Service: Gynecology;                Laterality: N/A; 03/27/2022: ESOPHAGOGASTRODUODENOSCOPY (EGD) WITH PROPOFOL; N/A     Comment:  Procedure: ESOPHAGOGASTRODUODENOSCOPY (EGD) WITH               PROPOFOL;  Surgeon: Lesly Rubenstein, MD;  Location:               [facility];  Service: Endoscopy;  Laterality: N/A; No date: INDUCED ABORTION     Comment:  x2 09/29/2021: IR CM INJ ANY COLONIC TUBE W/FLUORO 09/13/2021: LAPAROTOMY; N/A     Comment:  Procedure: EXPLORATORY LAPAROTOMY-Graham PATCH repair;                Surgeon: [name], DO;  Location: [facility] OR[facility];  Service:              General;  Laterality: N/A; 09/17/2021: LAPAROTOMY; N/A     Comment:  Procedure: EXPLORATORY LAPAROTOMY;  Surgeon: [name], MD;  Location: [facility] OR[facility];  Service: General;  Laterality: N/A; 09/17/2021: REPAIR OF PERFORATED ULCER; N/A     Comment:  Procedure: REPAIR OF PERFORATED ULCER;  Surgeon:               [name], MD;  Location: [facility] OR[facility];  Service:               General;  Laterality: N/A; 03/13/2018: VAGINAL HYSTERECTOMY; N/A     Comment:  Procedure: HYSTERECTOMY VAGINAL;  Surgeon: [name], MD;  Location: [facility];  Service: Gynecology;               Laterality: N/A;  BMI    Body Mass Index: 21.35 kg/m      Reproductive/Obstetrics negative OB ROS                            Anesthesia Physical Anesthesia Plan  ASA: 3  Anesthesia Plan: General   Post-op Pain Management:    Induction: Intravenous  PONV Risk Score and Plan: Propofol infusion and TIVA  Airway Management Planned: Natural Airway and Nasal  Cannula  Additional Equipment:   Intra-op Plan:   Post-operative Plan:   Informed Consent: I have reviewed the patients History and Physical, chart, labs and discussed the procedure including the risks, benefits and alternatives for the proposed anesthesia with the patient or authorized representative who has indicated his/her understanding and acceptance.     Dental Advisory Given  Plan Discussed with: Anesthesiologist, CRNA and Surgeon  Anesthesia Plan Comments: (Patient consented for risks of anesthesia including but not limited to:  - adverse reactions to medications - risk of airway placement if required - damage to eyes, teeth, lips or other oral mucosa - nerve damage due to positioning  - sore throat or hoarseness - Damage to heart, brain, nerves, lungs, other parts of body or loss of life  Patient voiced understanding.)        Anesthesia Quick Evaluation

## 2022-04-12 NOTE — Consult Note (Signed)
Maria Darby, MD [address]  [place]  [place], [place] [address]  Main: [phone]  Fax: [phone] Pager: [phone]   Consultation  Referring Provider:     No ref. provider found Primary Care Physician:  [name], NP Primary Gastroenterologist:  Dr. Haig Prophet       Reason for Consultation: Coffee-ground emesis, history of gastric ulcer  Date of Admission:  04/11/2022 Date of Consultation:  04/12/2022         HPI:   Maria [name] is a 51 y.o. female history of perforated peptic ulcer underwent surgery in 08/2021, chronic NSAID use as well as alcohol use.  Patient was recently admitted about 2 weeks ago at [facility] secondary to upper GI bleed, underwent EGD by Dr. Haig Prophet on 03/07/2022, revealed erosive esophagitis as well as nonbleeding gastric ulcer with flat pigmented spot.  This was not treated.  Patient was supposed to be sent home on Protonix 40 mg twice daily, but she is on once a day only.  She now comes in with same symptoms of upper abdominal pain associated with coffee-ground emesis, melena. Patient had history of H. pylori during the initial event in March 2023, treated with Pylera by Dr. Lysle Pearl.  Labs on admission revealed hemoglobin 10.4, MCV 102, dropped to 7.1 today, platelets 124, elevated BUN/creatinine 40/0.81, started on pantoprazole drip, kept n.p.o. and GI is consulted for further evaluation.  Patient admits to drinking alcohol, a glass of wine daily.  She states that she needs help with cessation of alcohol use.  NSAIDs: Meloxicam  Antiplts/Anticoagulants/Anti thrombotics: None  GI Procedures:  EGD 03/27/2022 - LA Grade A esophagitis with no bleeding. - Non-bleeding gastric ulcer with a flat pigmented spot (Forrest Class IIc). Biopsied. - Erythematous duodenopathy. DIAGNOSIS:  A.  STOMACH; COLD BIOPSY:  - OXYNTIC MUCOSA WITH CHANGES CONSISTENT WITH PROTON PUMP INHIBITOR  EFFECT AND NON-SPECIFIC SUPERFICIAL VASCULAR CONGESTION.  -  NEGATIVE FOR H. PYLORI, INTESTINAL METAPLASIA, DYSPLASIA, AND  MALIGNANCY.    DIAGNOSIS:  A. GASTRIC ULCER; BIOPSY:  - ULCERATION WITH GRANULATION TISSUE TYPE CHANGES AND SUPERFICIAL  FIBRINOPURULENT DEBRIS.  - NO DEFINITE GASTRIC MUCOSAL GLANDS IDENTIFIED.  - NO EVIDENCE OF MALIGNANCY. ADDENDUM:  An immunohistochemical study directed against H. pylori was performed,  and is positive, highlighting numerous microorganisms within the  fibrinopurulent debris. Past Medical History:  Diagnosis Date   Acute postoperative anemia due to expected blood loss 09/19/2021   Anemia    Anesthesia complication, initial encounter 09/13/2021   Difficulty waking up from Anesthesia requiring re-intubation   Ankle edema, bilateral 11/15/2021   Anxiety    h/o   Depression    h/o   Family history of adverse reaction to anesthesia    mom-hard time waking up   Folate deficiency 07/30/2019   Gastritis 08/19/2020   GERD (gastroesophageal reflux disease)    tums prn   Hypertension    WAS PUT ON BP MED BY PCP LAST YEAR (2018) AND BP MED MADE PT SICK SO SHE STOPPED TAKING IT-PCP MONITORS BP NOW   Palpitations 05/18/2020   Pancreatitis    Perforated peptic ulcer ([place]) 09/14/2021   Poison ivy dermatitis 11/13/2019   Vitamin B12 deficiency 01/17/2018   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    Past Surgical History:  Procedure Laterality Date   ANTERIOR AND POSTERIOR REPAIR WITH SACROSPINOUS FIXATION N/A 03/13/2018   Procedure: ANTERIOR REPAIR;  Surgeon: [name], MD;  Location: [facility];  Service: Gynecology;  Laterality:  N/A;   ESOPHAGOGASTRODUODENOSCOPY (EGD) WITH PROPOFOL N/A 03/27/2022   Procedure: ESOPHAGOGASTRODUODENOSCOPY (EGD) WITH PROPOFOL;  Surgeon: Lesly Rubenstein, MD;  Location: [facility];  Service: Endoscopy;  Laterality: N/A;   INDUCED ABORTION     x2   IR CM INJ ANY COLONIC TUBE W/FLUORO  09/29/2021   LAPAROTOMY N/A 09/13/2021   Procedure: EXPLORATORY LAPAROTOMY-Graham PATCH  repair;  Surgeon: [name], DO;  Location: [facility];  Service: General;  Laterality: N/A;   LAPAROTOMY N/A 09/17/2021   Procedure: EXPLORATORY LAPAROTOMY;  Surgeon: [name], MD;  Location: [facility];  Service: General;  Laterality: N/A;   REPAIR OF PERFORATED ULCER N/A 09/17/2021   Procedure: REPAIR OF PERFORATED ULCER;  Surgeon: [name], MD;  Location: [facility];  Service: General;  Laterality: N/A;   VAGINAL HYSTERECTOMY N/A 03/13/2018   Procedure: HYSTERECTOMY VAGINAL;  Surgeon: [name], MD;  Location: [facility];  Service: Gynecology;  Laterality: N/A;     Current Facility-Administered Medications:    0.9 %  sodium chloride infusion (Manually program via Guardrails IV Fluids), , Intravenous, Once, Enzo Bi, MD   folic acid injection 1 mg, 1 mg, Intravenous, Daily, Jose Persia, MD, 1 mg at 04/11/22 2139   HYDROcodone-acetaminophen (NORCO/VICODIN) 5-325 MG per tablet 1 tablet, 1 tablet, Oral, Q6H PRN, Enzo Bi, MD, 1 tablet at 04/12/22 1055   LORazepam (ATIVAN) tablet 1-4 mg, 1-4 mg, Oral, Q2H PRN **OR** LORazepam (ATIVAN) injection 1-4 mg, 1-4 mg, Intravenous, Q2H PRN, Jose Persia, MD   ondansetron (ZOFRAN) tablet 4 mg, 4 mg, Oral, Q6H PRN **OR** ondansetron (ZOFRAN) injection 4 mg, 4 mg, Intravenous, Q6H PRN, Jose Persia, MD   pantoprazole (PROTONIX) injection 40 mg, 40 mg, Intravenous, Q12H, Charleen Kirks, [place], MD   sodium chloride flush (NS) 0.9 % injection 3 mL, 3 mL, Intravenous, Q12H, Charleen Kirks, [place]ulia, MD   thiamine (VITAMIN B1) injection 100 mg, 100 mg, Intravenous, Daily, Jose Persia, MD, 100 mg at 04/11/22 2139  Current Outpatient Medications:    amLODipine (NORVASC) 5 MG tablet, Take 1 tablet (5 mg total) by mouth daily. for blood pressure., Disp: 30 tablet, Rfl: 0   EPINEPHrine 0.3 mg/0.3 mL IJ SOAJ injection, Inject 0.3 mLs (0.3 mg total) into the muscle as needed for anaphylaxis. (Patient not taking: Reported on 03/30/2022), Disp: 1 Device, Rfl:  0   ondansetron (ZOFRAN) 4 MG tablet, Take 4 mg by mouth daily as needed. (Patient not taking: Reported on 03/30/2022), Disp: , Rfl:    pantoprazole (PROTONIX) 40 MG tablet, Take 1 tablet (40 mg total) by mouth daily., Disp: 30 tablet, Rfl: 1   Family History  Problem Relation Age of Onset   Arthritis Mother    Arthritis Father    Alcohol abuse Father    Hypertension Father    Heart disease Father    Aneurysm Father    Breast cancer Maternal Grandmother    Breast cancer Paternal Grandmother      Social History   Tobacco Use   Smoking status: Every Day    Packs/day: 1.00    Years: 20.00    Total pack years: 20.00    Types: Cigarettes   Smokeless tobacco: Never  Vaping Use   Vaping Use: Never used  Substance Use Topics   Alcohol use: Yes    Alcohol/week: 2.0 standard drinks of alcohol    Types: 2 Glasses of wine per week    Comment: two glasses wine/beer every other day   Drug use: No    Allergies  as of 04/11/2022 - Review Complete 04/11/2022  Allergen Reaction Noted   Anesthesia s-i-40 [propofol]  09/14/2021   Bee venom Swelling 03/05/2018   Bupropion Anxiety and Other (See Comments) 02/04/2014    Review of Systems:    All systems reviewed and negative except where noted in HPI.   Physical Exam:  Vital signs in last 24 hours: Temp:  [97.6 F (36.4 C)-98.1 F (36.7 C)] 98 F (36.7 C) (10/12 1001) Pulse Rate:  [62-141] 68 (10/12 1001) Resp:  [11-18] 12 (10/12 1001) BP: (98-118)/(60-70) 108/60 (10/12 1001) SpO2:  [97 %-100 %] 98 % (10/12 1001) Weight:  [52.6 kg] 52.6 kg (10/11 1517)   General:   Pleasant, cooperative in NAD Head:  Normocephalic and atraumatic. Eyes:   No icterus.   Conjunctiva pale. PERRLA. Ears:  Normal auditory acuity. Neck:  Supple; no masses or thyroidomegaly Lungs: Respirations even and unlabored. Lungs clear to auscultation bilaterally.   No wheezes, crackles, or rhonchi.  Heart:  Regular rate and rhythm;  Without murmur, clicks, rubs  or gallops Abdomen:  Soft, nondistended, mild epigastric tenderness. Normal bowel sounds. No appreciable masses or hepatomegaly.  No rebound or guarding.  Rectal:  Not performed. Msk:  Symmetrical without gross deformities.  Strength generalized weakness Extremities:  Without edema, cyanosis or clubbing. Neurologic:  Alert and oriented x3;  grossly normal neurologically. Skin:  Intact without significant lesions or rashes. Psych:  Alert and cooperative. Normal affect.  LAB RESULTS:    Latest Ref Rng & Units 04/12/2022    5:05 AM 04/12/2022   12:18 AM 04/11/2022    3:03 PM  CBC  WBC 4.0 - 10.5 K/uL 5.6  6.7  11.0   Hemoglobin 12.0 - 15.0 g/dL 7.1  7.6  10.4   Hematocrit 36.0 - 46.0 % 21.2  22.6  31.0   Platelets 150 - 400 K/uL 124  126  237     BMET    Latest Ref Rng & Units 04/12/2022    5:05 AM 04/11/2022    3:03 PM 03/28/2022    6:52 AM  BMP  Glucose 70 - 99 mg/dL 97  191  95   BUN 6 - 20 mg/dL 41  49  11   Creatinine 0.44 - 1.00 mg/dL 0.72  0.81  0.60   Sodium 135 - 145 mmol/L 140  139  141   Potassium 3.5 - 5.1 mmol/L 3.5  4.3  4.3   Chloride 98 - 111 mmol/L 109  106  111   CO2 22 - 32 mmol/L '25  24  23   ' Calcium 8.9 - 10.3 mg/dL 8.8  9.7  9.2     LFT    Latest Ref Rng & Units 04/11/2022    3:03 PM 03/28/2022    6:52 AM 03/27/2022    5:28 AM  Hepatic Function  Total Protein 6.5 - 8.1 g/dL 6.9  6.3  6.7   Albumin 3.5 - 5.0 g/dL 4.0  3.4  3.7   AST 15 - 41 U/L '21  15  16   ' ALT 0 - 44 U/L '11  8  8   ' Alk Phosphatase 38 - 126 U/L 85  76  83   Total Bilirubin 0.3 - 1.2 mg/dL 1.0  1.4  1.6      STUDIES: No results found.    Impression / Plan:   Maria [name] is a 51 y.o. female with history of perforated peptic ulcer disease, s/p exploratory laparotomy redo peptic ulcer perforation  repair, history of H. pylori, s/p treatment with Pylera, is admitted with recurrence of upper abdominal pain and coffee-ground emesis, acute blood loss anemia  History of gastric  ulcer, coffee-ground emesis, acute blood loss anemia Elevated BUN/creatinine suggestive of upper GI bleed Patient has ongoing alcohol use with occasional NSAID use Continue pantoprazole drip Recent recommend blood transfusion to maintain hemoglobin above 7 Maintain strict n.p.o. Proceed with upper endoscopy today Patient should remain abstinent from alcohol use and NSAID use Recommend long-term PPI omeprazole 40 mg p.o. twice daily before meals Repeat biopsies from 03/27/2022 on EGD, did not reveal H. Pylori  Chronic alcohol use Microcytic anemia thrombocytopenia Normal serum ferritin and folic acid levels Check vitamin B12 levels Recommend multivitamin plus thiamine plus folic acid daily Strict abstinence from alcohol use Patient can be started on Librium 50 mg and gabapentin 100 to 200 mg daily to help with alcohol cessation  I have discussed alternative options, risks & benefits,  which include, but are not limited to, bleeding, infection, perforation,respiratory complication & drug reaction.  The patient agrees with this plan & written consent will be obtained.     Thank you for involving me in the care of this patient.      LOS: 0 days   Sherri Sear, MD  04/12/2022, 11:03 AM    Note: This dictation was prepared with Dragon dictation along with smaller phrase technology. Any transcriptional errors that result from this process are unintentional.

## 2022-04-12 NOTE — Progress Notes (Signed)
  PROGRESS NOTE    Maria [name]  GYB:[id number] DOB: Jun 02, 1971 DOA: 04/11/2022 PCP: [name], NP  Melanie Crazier Marlynn Perking  LOS: 0 days   Brief hospital course:   Assessment & Plan: Maria [name] is a 51 y.o. female with medical history significant of perforated PUD (08/2021), acute blood loss anemia, alcohol use disorder, pancreatitis, vitamin B12 deficiency, hypertension, presenting to the ED with epigastric abdominal pain, hematemesis and melena.     * Upper GI bleed Patient presenting with 12-hour history of 3 episodes of hematemesis and 3 episodes of melena in the setting of known gastric ulcer and recent history of palpitate ulcer disease.  Most recent endoscopy approximately 2 weeks ago with evidence of esophagitis with nonbleeding gastric ulcer.  Patient is tachycardic with borderline low blood pressure.   Plan: --1u pRBC today for Hgb 7.1 --EGD today, found large clean-based ulcer in the duodenal bulb --will discharge on Omeprazole 40 BID for 3 months --outpatient f/u with Dr. Marius Ditch  PUD (peptic ulcer disease) 42-monthhistory of peptic ulcer disease in the setting of NSAID use and daily alcohol use.  Patient has not been taking any NSAID but has continued to drink daily, although she has cut down.   --see above for management  Acute on chronic blood loss anemia Patient has a several month history of acute blood loss anemia 2/2 GI bleed. Plan: --1u pRBC today for Hgb 7.1  Alcohol abuse Patient states that she was previously drinking 1/5 of liquor per day prior to March 2023 but has been since cutting down.  She is currently drinking 1 drink per day.  She is extremely motivated to quit drinking altogether and is willing to try naltrexone.  No evidence of alcohol withdrawal at this time.  Patient is likely tachycardic secondary to dehydration.  -Start naltrexone 50 mg daily prior to discharge - CIWA with as needed Ativan ordered  Hypomagnesemia --replete PRN   DVT  prophylaxis: SCD/Compression stockings Code Status: Full code  Family Communication:  Level of care: Med-Surg Dispo:   The patient is from: home Anticipated d/c is to: home Anticipated d/c date is: tomorrow   Subjective and Interval History:  No more hematemesis since presentation.    Went for EGD today, found large clean-based ulcer in the duodenal bulb.   Objective: Vitals:   04/12/22 1529 04/12/22 1539 04/12/22 1549 04/12/22 1559  BP: 110/62     Pulse: 63 60    Resp:      Temp:      TempSrc:      SpO2: 100% 100% 100% 100%  Weight:      Height:        Intake/Output Summary (Last 24 hours) at 04/12/2022 1624 Last data filed at 04/12/2022 1501 Gross per 24 hour  Intake 1210 ml  Output 0 ml  Net 1210 ml   Filed Weights   04/11/22 1517 04/12/22 1323  Weight: 52.6 kg 51.3 kg    Examination:   Constitutional: NAD, AAOx3 HEENT: conjunctivae and lids normal, EOMI CV: No cyanosis.   RESP: normal respiratory effort, on RA Neuro: II - XII grossly intact.   Psych: Normal mood and affect.  Appropriate judgement and reason   Data Reviewed: I have personally reviewed labs and imaging studies  Time spent: 50 minutes  TEnzo Bi MD [facility] If 7PM-7AM, please contact night-coverage 04/12/2022, 4:24 PM

## 2022-04-12 NOTE — Anesthesia Postprocedure Evaluation (Signed)
Anesthesia Post Note  Patient: Maria [name]  Procedure(s) Performed: ESOPHAGOGASTRODUODENOSCOPY (EGD) WITH PROPOFOL  Patient location during evaluation: Endoscopy Anesthesia Type: General Level of consciousness: awake and alert Pain management: pain level controlled Vital Signs Assessment: post-procedure vital signs reviewed and stable Respiratory status: spontaneous breathing, nonlabored ventilation, respiratory function stable and patient connected to nasal cannula oxygen Cardiovascular status: blood pressure returned to baseline and stable Postop Assessment: no apparent nausea or vomiting Anesthetic complications: no   No notable events documented.   Last Vitals:  Vitals:   04/12/22 1529 04/12/22 1539  BP: 110/62   Pulse: 63 60  Resp:    Temp:    SpO2: 100% 100%    Last Pain:  Vitals:   04/12/22 1539  TempSrc:   PainSc: 0-No pain                 Precious Haws Cavon Nicolls

## 2022-04-13 ENCOUNTER — Encounter: Payer: Self-pay | Admitting: Gastroenterology

## 2022-04-13 DIAGNOSIS — K922 Gastrointestinal hemorrhage, unspecified: Secondary | ICD-10-CM | POA: Diagnosis not present

## 2022-04-13 LAB — BASIC METABOLIC PANEL
Anion gap: 4 — ABNORMAL LOW (ref 5–15)
BUN: 21 mg/dL — ABNORMAL HIGH (ref 6–20)
CO2: 27 mmol/L (ref 22–32)
Calcium: 9.1 mg/dL (ref 8.9–10.3)
Chloride: 111 mmol/L (ref 98–111)
Creatinine, Ser: 0.69 mg/dL (ref 0.44–1.00)
GFR, Estimated: >60 mL/min (ref >60)
Glucose, Bld: 120 mg/dL — ABNORMAL HIGH (ref 70–99)
Potassium: 3.5 mmol/L (ref 3.5–5.1)
Sodium: 142 mmol/L (ref 135–145)

## 2022-04-13 LAB — BPAM RBC
Blood Product Expiration Date: 202311172359
ISSUE DATE / TIME: 202310121149
Unit Type and Rh: 5100

## 2022-04-13 LAB — CBC
HCT: 23.9 % — ABNORMAL LOW (ref 36.0–46.0)
Hemoglobin: 8.3 g/dL — ABNORMAL LOW (ref 12.0–15.0)
MCH: 34.6 pg — ABNORMAL HIGH (ref 26.0–34.0)
MCHC: 34.7 g/dL (ref 30.0–36.0)
MCV: 99.6 fL (ref 80.0–100.0)
Platelets: 100 10*3/uL — ABNORMAL LOW (ref 150–400)
RBC: 2.4 MIL/uL — ABNORMAL LOW (ref 3.87–5.11)
RDW: 13.5 % (ref 11.5–15.5)
WBC: 3.7 10*3/uL — ABNORMAL LOW (ref 4.0–10.5)
nRBC: 0 % (ref 0.0–0.2)

## 2022-04-13 LAB — TYPE AND SCREEN
ABO/RH(D): O POS
Antibody Screen: NEGATIVE
Unit division: 0

## 2022-04-13 LAB — MAGNESIUM: Magnesium: 1.7 mg/dL (ref 1.7–2.4)

## 2022-04-13 MED ORDER — THIAMINE HCL 100 MG PO TABS: 100.0000 mg | ORAL_TABLET | Freq: Every day | ORAL | Status: DC

## 2022-04-13 MED ORDER — OMEPRAZOLE 40 MG PO CPDR: 40.0000 mg | DELAYED_RELEASE_CAPSULE | Freq: Two times a day (BID) | ORAL | 2 refills | Status: DC

## 2022-04-13 NOTE — Discharge Summary (Signed)
Physician Discharge Summary   Maria [name]  female DOB: [birth year]  SFK:[id number]  PCP: [name], NP  Admit date: 04/11/2022 Discharge date: 04/13/2022  Admitted From: home Disposition:  home CODE STATUS: Full code  Discharge Instructions     Discharge instructions   Complete by: As directed    You have an ulcer in your duodenum, which likely caused bleeding.  Please take Prilosec 40 mg twice a day for 3 months, and follow up with Dr. Marius Ditch in 1 month.  Avoid alcohol.  No aspirin, ibuprofen, naproxen, or other non-steroidal anti-inflammatory drugs.   Dr. Enzo Bi [facility] Hospital Course:  For full details, please see H&P, progress notes, consult notes and ancillary notes.  Briefly,  Maria [name] is a 51 y.o. female with medical history significant of perforated PUD (08/2021), acute blood loss anemia, alcohol use disorder, pancreatitis, vitamin B12 deficiency, hypertension, presenting to the ED with epigastric abdominal pain, hematemesis and melena.     * Upper GI bleed 2/2 duodenal ulcer Patient presenting with 12-hour history of 3 episodes of hematemesis and 3 episodes of melena in the setting of known gastric ulcer.  Most recent endoscopy approximately 2 weeks ago with evidence of esophagitis with nonbleeding gastric ulcer.   --EGD found large clean-based ulcer in the duodenal bulb --discharged on Omeprazole 40 BID for 3 months --outpatient f/u with Dr. Marius Ditch in 1 month   Hx of PUD (peptic ulcer disease) 32-monthhistory of peptic ulcer disease in the setting of NSAID use and daily alcohol use.  Patient has not been taking any NSAID but has continued to drink daily, although she has cut down.     Acute on chronic blood loss anemia Patient has a several month history of acute blood loss anemia 2/2 GI bleed.  Anemia workup found no deficiency in iron, vit B12 or folate. --received 1u pRBC for Hgb 7.1.  Hgb 8.3 prior to discharge.   Alcohol  abuse Patient states that she was previously drinking 1/5 of liquor per day prior to March 2023 but has been since cutting down.  She is currently drinking 1 drink per day.  She is motivated to quit drinking altogether.      Hypomagnesemia --repleted PRN  Hx of HTN, not currently active --BP low normal. --home amlodipine d/c'ed   Unless noted above, medications under "STOP" list are ones pt was not taking PTA.  Discharge Diagnoses:  Principal Problem:   Upper GI bleed Active Problems:   PUD (peptic ulcer disease)   Acute on chronic blood loss anemia   Alcohol abuse   Hypomagnesemia   GI bleed   30 Day Unplanned Readmission Risk Score    Flowsheet Row ED to Hosp-Admission (Current) from 04/11/2022 in [facility] 30 Day Unplanned Readmission Risk Score (%) 17.96 Filed at 04/13/2022 0801       This score is the patient's risk of an unplanned readmission within 30 days of being discharged (0 -100%). The score is based on dignosis, age, lab data, medications, orders, and past utilization.   Low:  0-14.9   Medium: 15-21.9   High: 22-29.9   Extreme: 30 and above         Discharge Instructions:  Allergies as of 04/13/2022       Reactions   Anesthesia S-i-40 [propofol]    Bee Venom Swelling   Bupropion Anxiety, Other (See Comments)   Worsened anxiety  Medication List     STOP taking these medications    amLODipine 5 MG tablet Commonly known as: NORVASC   EPINEPHrine 0.3 mg/0.3 mL Soaj injection Commonly known as: EPI-PEN   ondansetron 4 MG tablet Commonly known as: ZOFRAN   pantoprazole 40 MG tablet Commonly known as: Protonix       TAKE these medications    omeprazole 40 MG capsule Commonly known as: PRILOSEC Take 1 capsule (40 mg total) by mouth 2 (two) times daily.   thiamine 100 MG tablet Commonly known as: VITAMIN B1 Take 1 tablet (100 mg total) by mouth daily.         Follow-up  Information     Lin Landsman, MD Follow up in 1 month(s).   Specialty: Gastroenterology Contact information: [place] [address] [phone]         [name], NP Follow up in 1 week(s).   Specialty: Internal Medicine Contact information: [place] [address] [phone]                 Allergies  Allergen Reactions   Anesthesia S-I-40 [Propofol]    Bee Venom Swelling   Bupropion Anxiety and Other (See Comments)    Worsened anxiety     The results of significant diagnostics from this hospitalization (including imaging, microbiology, ancillary and laboratory) are listed below for reference.   Consultations:   Procedures/Studies: CT ABDOMEN PELVIS W CONTRAST  Result Date: 03/27/2022 CLINICAL DATA:  Upper abdominal pain, hematemesis, recent perforated ulcer and exploratory laparotomy EXAM: CT ABDOMEN AND PELVIS WITH CONTRAST TECHNIQUE: Multidetector CT imaging of the abdomen and pelvis was performed using the standard protocol following bolus administration of intravenous contrast. RADIATION DOSE REDUCTION: This exam was performed according to the departmental dose-optimization program which includes automated exposure control, adjustment of the mA and/or kV according to patient size and/or use of iterative reconstruction technique. CONTRAST:  127m OMNIPAQUE IOHEXOL 300 MG/ML  SOLN COMPARISON:  01/19/2022 FINDINGS: Lower chest: No acute abnormality. Hepatobiliary: No solid liver abnormality is seen. No gallstones, gallbladder wall thickening, or biliary dilatation. Pancreas: Calcific stigmata of chronic pancreatitis involving the pancreatic head. Unchanged atrophy of the pancreatic parenchyma and diffuse prominence of the pancreatic duct. Spleen: Normal in size without significant abnormality. Adrenals/Urinary Tract: Adrenal glands are unremarkable. Kidneys are normal, without renal calculi, solid lesion, or  hydronephrosis. Bladder is unremarkable. Stomach/Bowel: Severe mucosal thickening and edema of the gastric antrum, pylorus, and duodenal bulb, with a new superiorly directed ulceration arising from the pylorus measuring 2.8 x 2.2 x 2.1 cm (series 5, image 24). This appears to be distinct from an ulceration of the anterior gastric antrum or distal gastric body seen on prior examination (in the vicinity of series 2, image 34). Appendix appears normal. Sigmoid diverticula. Vascular/Lymphatic: Aortic atherosclerosis. No enlarged abdominal or pelvic lymph nodes. Reproductive: Status post hysterectomy. Other: No abdominal wall hernia or abnormality. Unchanged small volume perihepatic ascites, likely loculated (series 2, image 13). New, trace perisplenic fluid (series 2, image 18). Musculoskeletal: No acute or significant osseous findings. IMPRESSION: 1. Severe mucosal thickening and edema of the gastric antrum, pylorus, and duodenal bulb, with a new superiorly directed ulceration arising from the pylorus measuring 2.8 x 2.2 x 2.1 cm. This appears to be distinct from an ulceration of the anterior gastric antrum or distal gastric body seen on prior examination. No evidence of overt perforation or free air in the abdomen at this time. 2.  Unchanged small volume perihepatic ascites, likely loculated. New, trace perisplenic ascites. 3. Calcific stigmata of chronic pancreatitis. Aortic Atherosclerosis (ICD10-I70.0). Electronically Signed   By: Delanna Ahmadi M.D.   On: 03/27/2022 11:11   DG Chest Port 1 View  Result Date: 03/27/2022 CLINICAL DATA:  Abdominal pain, vomiting EXAM: PORTABLE CHEST 1 VIEW COMPARISON:  09/14/2021 FINDINGS: Heart and mediastinal contours are within normal limits. No focal opacities or effusions. No acute bony abnormality. IMPRESSION: No active disease. Electronically Signed   By: Rolm Baptise M.D.   On: 03/27/2022 00:41      Labs: BNP (last 3 results) Recent Labs    09/16/21 0525  BNP  160.1*   Basic Metabolic Panel: Recent Labs  Lab 04/11/22 1503 04/12/22 0505 04/13/22 0705  NA 139 140 142  K 4.3 3.5 3.5  CL 106 109 111  CO2 '24 25 27  '$ GLUCOSE 191* 97 120*  BUN 49* 41* 21*  CREATININE 0.81 0.72 0.69  CALCIUM 9.7 8.8* 9.1  MG 1.6* 2.1 1.7   Liver Function Tests: Recent Labs  Lab 04/11/22 1503  AST 21  ALT 11  ALKPHOS 85  BILITOT 1.0  PROT 6.9  ALBUMIN 4.0   Recent Labs  Lab 04/11/22 1503  LIPASE 22   No results for input(s): "AMMONIA" in the last 168 hours. CBC: Recent Labs  Lab 04/11/22 1503 04/12/22 0018 04/12/22 0505 04/13/22 0705  WBC 11.0* 6.7 5.6 3.7*  HGB 10.4* 7.6* 7.1* 8.3*  HCT 31.0* 22.6* 21.2* 23.9*  MCV 102.0* 102.3* 102.9* 99.6  PLT 237 126* 124* 100*   Cardiac Enzymes: No results for input(s): "CKTOTAL", "CKMB", "CKMBINDEX", "TROPONINI" in the last 168 hours. BNP: Invalid input(s): "POCBNP" CBG: No results for input(s): "GLUCAP" in the last 168 hours. D-Dimer No results for input(s): "DDIMER" in the last 72 hours. Hgb A1c No results for input(s): "HGBA1C" in the last 72 hours. Lipid Profile No results for input(s): "CHOL", "HDL", "LDLCALC", "TRIG", "CHOLHDL", "LDLDIRECT" in the last 72 hours. Thyroid function studies No results for input(s): "TSH", "T4TOTAL", "T3FREE", "THYROIDAB" in the last 72 hours.  Invalid input(s): "FREET3" Anemia work up Recent Labs    04/11/22 1504 04/12/22 0505  VITAMINB12 315  --   FOLATE  --  12.4  FERRITIN  --  73  TIBC  --  217*  IRON  --  106   Urinalysis    Component Value Date/Time   COLORURINE YELLOW (A) 03/26/2022 1819   APPEARANCEUR CLEAR (A) 03/26/2022 1819   LABSPEC 1.016 03/26/2022 1819   PHURINE 6.0 03/26/2022 1819   GLUCOSEU NEGATIVE 03/26/2022 1819   HGBUR SMALL (A) 03/26/2022 1819   BILIRUBINUR NEGATIVE 03/26/2022 1819   KETONESUR 5 (A) 03/26/2022 1819   PROTEINUR NEGATIVE 03/26/2022 1819   UROBILINOGEN 1.0 04/28/2014 0234   NITRITE NEGATIVE 03/26/2022  1819   LEUKOCYTESUR NEGATIVE 03/26/2022 1819   Sepsis Labs Recent Labs  Lab 04/11/22 1503 04/12/22 0018 04/12/22 0505 04/13/22 0705  WBC 11.0* 6.7 5.6 3.7*   Microbiology No results found for this or any previous visit (from the past 240 hour(s)).   Total time spend on discharging this patient, including the last patient exam, discussing the hospital stay, instructions for ongoing care as it relates to all pertinent caregivers, as well as preparing the medical discharge records, prescriptions, and/or referrals as applicable, is 35 minutes.    Enzo Bi, MD  [facility] 04/13/2022, 8:21 AM

## 2022-04-16 ENCOUNTER — Telehealth: Payer: Self-pay

## 2022-04-16 NOTE — Telephone Encounter (Signed)
Transition Care Management Follow-up Telephone Call Date of discharge and from where: 04/13/2022 Maria [name]  How have you been since you were released from the hospital? Doing ok has been watching diet and following all instructions.  Any questions or concerns? No  Items Reviewed: Did the pt receive and understand the discharge instructions provided? Yes  Medications obtained and verified? Yes  Other? No  Any new allergies since your discharge? No  Dietary orders reviewed? Yes Do you have support at home? Yes   Home Care and Equipment/Supplies: Were home health services ordered? no If so, what is the name of the agency? N/A   Has the agency set up a time to come to the patient's home? no Were any new equipment or medical supplies ordered?  No What is the name of the medical supply agency? N/A Were you able to get the supplies/equipment? not applicable Do you have any questions related to the use of the equipment or supplies? No  Functional Questionnaire: (I = Independent and D = Dependent) ADLs:   Bathing/Dressing- I  Meal Prep- I  Eating- I  Maintaining continence- I  Transferring/Ambulation- I  Managing Meds- I   Follow up appointments reviewed:  PCP Hospital f/u appt confirmed? Yes  Scheduled to see Clark on 04/19/22 @ 10:20. [facility] f/u appt confirmed? Yes  Scheduled to see Dr. Marius Ditch (GI)  on 06/05/2022 @ 1:45. Are transportation arrangements needed? No  If their condition worsens, is the pt aware to call PCP or go to the Emergency Dept.? Yes Was the patient provided with contact information for the PCP's office or ED? Yes Was to pt encouraged to call back with questions or concerns? Yes

## 2022-04-19 ENCOUNTER — Encounter: Payer: Self-pay | Admitting: Primary Care

## 2022-04-19 ENCOUNTER — Ambulatory Visit (INDEPENDENT_AMBULATORY_CARE_PROVIDER_SITE_OTHER): Payer: BC Managed Care – PPO | Admitting: Primary Care

## 2022-04-19 VITALS — BP 136/78 | HR 60 | Temp 97.3°F | Ht 61.0 in | Wt 110.0 lb

## 2022-04-19 DIAGNOSIS — M546 Pain in thoracic spine: Secondary | ICD-10-CM

## 2022-04-19 DIAGNOSIS — K922 Gastrointestinal hemorrhage, unspecified: Secondary | ICD-10-CM

## 2022-04-19 DIAGNOSIS — F1090 Alcohol use, unspecified, uncomplicated: Secondary | ICD-10-CM | POA: Diagnosis not present

## 2022-04-19 DIAGNOSIS — K279 Peptic ulcer, site unspecified, unspecified as acute or chronic, without hemorrhage or perforation: Secondary | ICD-10-CM | POA: Diagnosis not present

## 2022-04-19 LAB — CBC
HCT: 30.3 % — ABNORMAL LOW (ref 36.0–46.0)
Hemoglobin: 10.1 g/dL — ABNORMAL LOW (ref 12.0–15.0)
MCHC: 33.5 g/dL (ref 30.0–36.0)
MCV: 103.6 fl — ABNORMAL HIGH (ref 78.0–100.0)
Platelets: 125 10*3/uL — ABNORMAL LOW (ref 150.0–400.0)
RBC: 2.92 Mil/uL — ABNORMAL LOW (ref 3.87–5.11)
RDW: 14.9 % (ref 11.5–15.5)
WBC: 4.3 10*3/uL (ref 4.0–10.5)

## 2022-04-19 MED ORDER — GABAPENTIN 100 MG PO CAPS: ORAL_CAPSULE | ORAL | 0 refills | Status: DC

## 2022-04-19 MED ORDER — CYCLOBENZAPRINE HCL 5 MG PO TABS: 5.0000 mg | ORAL_TABLET | Freq: Every evening | ORAL | 0 refills | Status: DC | PRN

## 2022-04-19 NOTE — Assessment & Plan Note (Signed)
Strongly advised she stop drinking alcohol.  Start gabapentin 100-200 mg HS for cravings. She will update.

## 2022-04-19 NOTE — Patient Instructions (Signed)
Stop by the lab prior to leaving today. I will notify you of your results once received.   Continue omeprazole 40 mg twice daily for ulcer.  Start gabapentin 100 mg at bedtime for alcohol use. Take 1-2 capsules at bedtime.  You may take cyclobenzaprine 5 mg at bedtime for muscle spasms. Do not start gabapentin and cyclobenzaprine at the same time.  Follow up with GI as scheduled.  It was a pleasure to see you today!

## 2022-04-19 NOTE — Assessment & Plan Note (Signed)
Symptoms representative of muscle spasms.  Start cyclobenzaprine 5 mg HS PRN. Discussed stretching, avoid repetitive movement.

## 2022-04-19 NOTE — Assessment & Plan Note (Signed)
Repeat CBC pending. Continue omeprazole 40 mg BID. Hospital notes, labs, imaging reviewed.

## 2022-04-19 NOTE — Progress Notes (Signed)
Subjective:    Patient ID: Maria [name], female    DOB: August 07, 1970, 51 y.o.   MRN: [id number]  Shoulder Pain  Pertinent negatives include no numbness.    Maria [name] is a very pleasant 51 y.o. female with a history of peptic ulcer disease, upper GI bleed, alcohol dependence, thrombocytopenia, chronic pancreatitis, depression, peripheral neuropathy who presents today for hospital follow-up and to discuss shoulder pain.  She presented to [facility] ED on 04/11/2022 with hematemesis and dark stools with epigastric pain.  Upon arrival she was noted to be tachycardic with heart rate trending upwards to 140s with softer blood pressures, blood work demonstrated AKI with mild anemia.  She was admitted for further evaluation.  During her hospital stay she was initiated on pantoprazole IV drip.  She admitted to continuous alcohol consumption, at least 1 glass of wine daily, also admitted that she needed help with cessation of alcohol use.  She underwent upper endoscopy on 03/27/2022 which revealed nonbleeding gastric ulcer.  There was discussion regarding initiation of Librium 50 mg daily and gabapentin 100 to 200 mg daily for alcohol cessation.  Also separate discussion from another provider regarding naltrexone. Unfortunately, hemoglobin dropped to 7.1 so she required 1 unit of packed red blood cells.  She underwent repeat upper endoscopy which revealed a large clean-based ulcer in the duodenal bulb.  She was discharged home on 04/13/2022 with a prescription for 40 mg of omeprazole twice daily x3 months and recommendations for outpatient GI follow-up.  Since her hospital discharge she is feeling better. She is scheduled to see GI in December 2023. She is taking omeprazole 40 mg BID and thiamine 100 mg once daily. She continues to drink alcohol last drink was last night, one glass of red wine. She denies rectal bleeding and vomiting. She would like some help with alcohol cravings. She has been to work this  week and has done well.   Acute posterior left thoracic back pain for the last one week. Symptoms feel like a "pulling" which is worse with certain movement. 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 She's applied Biofreeze and other topical agents, warm compresses without improvement. She denies numbness, radiation of pain, right sided back pain, acute trauma.     Review of Systems  Respiratory:  Negative for shortness of breath.   Cardiovascular:  Negative for palpitations.  Gastrointestinal:  Negative for abdominal pain, blood in stool and constipation.  Musculoskeletal:  Positive for back pain and myalgias.  Neurological:  Negative for syncope and numbness.         Past Medical History:  Diagnosis Date   Acute postoperative anemia due to expected blood loss 09/19/2021   Anemia    Anesthesia complication, initial encounter 09/13/2021   Difficulty waking up from Anesthesia requiring re-intubation   Ankle edema, bilateral 11/15/2021   Anxiety    h/o   Depression    h/o   Family history of adverse reaction to anesthesia    mom-hard time waking up   Folate deficiency 07/30/2019   Gastritis 08/19/2020   GERD (gastroesophageal reflux disease)    tums prn   Hypertension    WAS PUT ON BP MED BY PCP LAST YEAR (2018) AND BP MED MADE PT SICK SO SHE STOPPED TAKING IT-PCP MONITORS BP NOW   Palpitations 05/18/2020   Pancreatitis    Peptic ulcer    Perforated peptic ulcer ([place]) 09/14/2021   Poison ivy dermatitis 11/13/2019  Vitamin B12 deficiency 01/17/2018   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    Social History   Socioeconomic History   Marital status: Married    Spouse name: Not on file   Number of children: Not on file   Years of education: Not on file   Highest education level: Not on file  Occupational History   Not on file  Tobacco Use   Smoking status: Every Day    Packs/day: 1.00    Years: 20.00    Total pack years: 20.00     Types: Cigarettes   Smokeless tobacco: Never  Vaping Use   Vaping Use: Never used  Substance and Sexual Activity   Alcohol use: Yes    Alcohol/week: 2.0 standard drinks of alcohol    Types: 2 Glasses of wine per week   Drug use: No   Sexual activity: Yes  Other Topics Concern   Not on file  Social History Narrative   Married.   1 child.    Working at [employer] at [employer].     Enjoys swimming, camping   Social Determinants of Health   Financial Resource Strain: Not on file  Food Insecurity: No Food Insecurity (04/12/2022)   Hunger Vital Sign    Worried About Running Out of Food in the Last Year: Never true    Ran Out of Food in the Last Year: Never true  Transportation Needs: No Transportation Needs (04/12/2022)   PRAPARE - Hydrologist (Medical): No    Lack of Transportation (Non-Medical): No  Physical Activity: Not on file  Stress: Not on file  Social Connections: Not on file  Intimate Partner Violence: Not At Risk (04/12/2022)   Humiliation, Afraid, Rape, and Kick questionnaire    Fear of Current or Ex-Partner: No    Emotionally Abused: No    Physically Abused: No    Sexually Abused: No    Past Surgical History:  Procedure Laterality Date   ANTERIOR AND POSTERIOR REPAIR WITH SACROSPINOUS FIXATION N/A 03/13/2018   Procedure: ANTERIOR REPAIR;  Surgeon: [name], MD;  Location: [facility] ORS;  Service: Gynecology;  Laterality: N/A;   ESOPHAGOGASTRODUODENOSCOPY (EGD) WITH PROPOFOL N/A 03/27/2022   Procedure: ESOPHAGOGASTRODUODENOSCOPY (EGD) WITH PROPOFOL;  Surgeon: Lesly Rubenstein, MD;  Location: [facility] ENDOSCOPY;  Service: Endoscopy;  Laterality: N/A;   ESOPHAGOGASTRODUODENOSCOPY (EGD) WITH PROPOFOL N/A 04/12/2022   Procedure: ESOPHAGOGASTRODUODENOSCOPY (EGD) WITH PROPOFOL;  Surgeon: Lin Landsman, MD;  Location[facility] ENDOSCOPY;  Service: Gastroenterology;  Laterality: N/A;   INDUCED ABORTION     x2   IR CM INJ ANY COLONIC TUBE W/FLUORO   09/29/2021   LAPAROTOMY N/A 09/13/2021   Procedure: EXPLORATORY LAPAROTOMY-Graham PATCH repair;  Surgeon: [name], DO;  Location: [facility] ORS;  Service: General;  Laterality: N/A;   LAPAROTOMY N/A 09/17/2021   Procedure: EXPLORATORY LAPAROTOMY;  Surgeon: [name], MD;  Location: [facility] ORS;  Service: General;  Laterality: N/A;   REPAIR OF PERFORATED ULCER N/A 09/17/2021   Procedure: REPAIR OF PERFORATED ULCER;  Surgeon: [name], MD;  Location: [facility] ORS;  Service: General;  Laterality: N/A;   VAGINAL HYSTERECTOMY N/A 03/13/2018   Procedure: HYSTERECTOMY VAGINAL;  Surgeon: [name], MD;  Location: [facility] ORS;  Service: Gynecology;  Laterality: N/A;    Family History  Problem Relation Age of Onset   Arthritis Mother    Arthritis Father    Alcohol abuse Father    Hypertension Father    Heart disease Father  Aneurysm Father    Breast cancer Maternal Grandmother    Breast cancer Paternal Grandmother     Allergies  Allergen Reactions   Anesthesia S-I-40 [Propofol]    Bee Venom Swelling   Bupropion Anxiety and Other (See Comments)    Worsened anxiety    Current Outpatient Medications on File Prior to Visit  Medication Sig Dispense Refill   omeprazole (PRILOSEC) 40 MG capsule Take 1 capsule (40 mg total) by mouth 2 (two) times daily. 60 capsule 2   thiamine (VITAMIN B1) 100 MG tablet Take 1 tablet (100 mg total) by mouth daily.     No current facility-administered medications on file prior to visit.    BP 136/78   Pulse 60   Temp (!) 97.3 F (36.3 C) (Temporal)   Ht '5\' 1"'$  (1.549 m)   Wt 110 lb (49.9 kg)   LMP 02/24/2018 (Approximate) Comment: spotting   SpO2 99%   BMI 20.78 kg/m  Objective:   Physical Exam Cardiovascular:     Rate and Rhythm: Normal rate and regular rhythm.  Pulmonary:     Effort: Pulmonary effort is normal.     Breath sounds: Normal breath sounds.  Musculoskeletal:     Cervical back: Neck supple.     Thoracic back: Spasms and  tenderness present. Normal range of motion.       Back:  Skin:    General: Skin is warm and dry.           Assessment & Plan:   Problem List Items Addressed This Visit       Digestive   PUD (peptic ulcer disease) - Primary    Recent hospitalization for upper GI bleed and PUD. Hospital labs, imaging, notes reviewed.   Continue omeprazole 40 mg BID. Strongly advised she stop drinking alcohol. Will treat to help with cravings.  Repeat CBC pending. Follow up with GI as scheduled.       Relevant Orders   CBC   Upper GI bleed    Repeat CBC pending. Continue omeprazole 40 mg BID. Hospital notes, labs, imaging reviewed.        Other   Back pain    Symptoms representative of muscle spasms.  Start cyclobenzaprine 5 mg HS PRN. Discussed stretching, avoid repetitive movement.       Relevant Medications   cyclobenzaprine (FLEXERIL) 5 MG tablet   Alcohol use disorder    Strongly advised she stop drinking alcohol.  Start gabapentin 100-200 mg HS for cravings. She will update.       Relevant Medications   gabapentin (NEURONTIN) 100 MG capsule       [name], NP

## 2022-04-19 NOTE — Assessment & Plan Note (Signed)
Recent hospitalization for upper GI bleed and PUD. Hospital labs, imaging, notes reviewed.   Continue omeprazole 40 mg BID. Strongly advised she stop drinking alcohol. Will treat to help with cravings.  Repeat CBC pending. Follow up with GI as scheduled.

## 2022-04-25 ENCOUNTER — Other Ambulatory Visit: Payer: Self-pay | Admitting: Primary Care

## 2022-04-25 DIAGNOSIS — M546 Pain in thoracic spine: Secondary | ICD-10-CM

## 2022-04-25 MED ORDER — CYCLOBENZAPRINE HCL 5 MG PO TABS: 5.0000 mg | ORAL_TABLET | Freq: Every evening | ORAL | 0 refills | Status: DC | PRN

## 2022-04-26 ENCOUNTER — Ambulatory Visit (INDEPENDENT_AMBULATORY_CARE_PROVIDER_SITE_OTHER)
Admission: RE | Admit: 2022-04-26 | Discharge: 2022-04-26 | Disposition: A | Discharge status: Home / Self Care | Payer: BC Managed Care – PPO | Source: Ambulatory Visit | Attending: Primary Care | Admitting: Primary Care

## 2022-04-26 DIAGNOSIS — M546 Pain in thoracic spine: Secondary | ICD-10-CM

## 2022-04-29 ENCOUNTER — Telehealth: Payer: Self-pay | Admitting: Primary Care

## 2022-04-29 DIAGNOSIS — M546 Pain in thoracic spine: Secondary | ICD-10-CM

## 2022-04-30 NOTE — Telephone Encounter (Signed)
Called patient states that she has not started flexeril yet. She will try and let us know if any improvement. She states she is ok right now it doesn't start to bother her until later in the night.

## 2022-04-30 NOTE — Telephone Encounter (Addendum)
Pt was seen by Gentry Fitz NP on 04/19/22. Pt was  given Cyclobenzaprine 5 mg # 14 taking 1 tab hs prn for pain on 04/25/22. Sending note to Gentry Fitz NP and [employer].    [place] Night - Client TELEPHONE ADVICE RECORD Access[facility]urse Patient Name: Maria [name] Gender: Female DOB: Apr 27, 1971 Age: 51 Y 70 M 14 D Return Phone Number: [phone] (Primary) Address: City/ State/ Zip: [place]  [address] Client [facility] Night - Client Client Site [place] - Night Provider Alma Friendly - NP Contact Type Call Who Is Calling Patient / Member / Family / Caregiver Call Type Triage / Clinical Relationship To Patient Self Return Phone Number [phone] (Primary) Chief Complaint Back Injury Reason for Call Symptomatic / Request for [place] states that she needs is having extreme back pain and she needs prescription for her back pain. Caller states that she is not sure its not injury. Caller sttes that she has had xrays pertaining to her back already. So her NP is aware but she needs medication to be able to tolerate the psin Translation No Nurse Assessment Nurse: Jearld Pies, RN, Lovena Le Date/Time Eilene Ghazi Time): 04/28/2022 10:16:47 AM Confirm and document reason for call. If symptomatic, describe symptoms. ---Back pain started several weeks ago. Rx muscle relaxer without relief. Current pain level 10/10. Last pain reliever 8am. Does the patient have any new or worsening symptoms? ---Yes Will a triage be completed? ---Yes Related visit to physician within the last 2 weeks? ---Yes Does the PT have any chronic conditions? (i.e. diabetes, asthma, this includes High risk factors for pregnancy, etc.) ---No Is the patient pregnant or possibly pregnant? (Ask all females between the ages of 81-55) ---No Is this a behavioral health or substance abuse call?  ---No Guidelines Guideline Title Affirmed Question Affirmed Notes Nurse Date/Time (Eastern Time) Back Pain [1] SEVERE back pain (e.g., excruciating, unable to do any normal activities) AND [2] Jake Bathe 04/28/2022 10:17:57 AM PLEASE NOTE: All timestamps contained within this report are represented as [place] Standard Time. CONFIDENTIALTY NOTICE: This fax transmission is intended only for the addressee. It contains information that is legally privileged, confidential or otherwise protected from use or disclosure. If you are not the intended recipient, you are strictly prohibited from reviewing, disclosing, copying using or disseminating any of this information or taking any action in reliance on or regarding this information. If you have received this fax in error, please notify us immediately by telephone so that we can arrange for its return to [place]. Phone: [phone], Toll-Free: [phone], Fax: [phone] Page: 2 of 2 Call Id: [id number] Guidelines Guideline Title Affirmed Question Affirmed Notes Nurse Date/Time Eilene Ghazi Time) not improved 2 hours after pain medicine Disp. Time Eilene Ghazi Time) Disposition Final User 04/28/2022 10:19:11 AM See HCP within 4 Hours (or PCP triage) Yes Jearld Pies, RN, Lovena Le Final Disposition 04/28/2022 10:19:11 AM See HCP within 4 Hours (or PCP triage) Yes Jearld Pies, RN, Apolonio Schneiders Disagree/Comply Comply Caller Understands Yes PreDisposition Did not know what to do Care Advice Given Per Guideline SEE HCP (OR PCP TRIAGE) WITHIN 4 HOURS: * IF OFFICE WILL BE CLOSED AND NO PCP (PRIMARY CARE PROVIDER) SECOND-LEVEL TRIAGE: You need to be seen within the next 3 or 4 hours. A nearby Urgent Care Center [facility]) is often a good source of care. Another choice is to go to the ED. Go sooner if you become worse. CALL BACK IF: *  You become worse Referrals GO TO FACILITY REFUSE

## 2022-05-04 ENCOUNTER — Encounter: Payer: Self-pay | Admitting: Primary Care

## 2022-05-04 ENCOUNTER — Ambulatory Visit (INDEPENDENT_AMBULATORY_CARE_PROVIDER_SITE_OTHER): Payer: BC Managed Care – PPO | Admitting: Primary Care

## 2022-05-04 ENCOUNTER — Other Ambulatory Visit: Payer: Self-pay | Admitting: Primary Care

## 2022-05-04 ENCOUNTER — Other Ambulatory Visit: Payer: Self-pay | Admitting: Family

## 2022-05-04 VITALS — BP 142/78 | HR 67 | Temp 98.0°F | Ht 61.0 in | Wt 115.0 lb

## 2022-05-04 DIAGNOSIS — M546 Pain in thoracic spine: Secondary | ICD-10-CM

## 2022-05-04 DIAGNOSIS — M549 Dorsalgia, unspecified: Secondary | ICD-10-CM

## 2022-05-04 DIAGNOSIS — F1090 Alcohol use, unspecified, uncomplicated: Secondary | ICD-10-CM

## 2022-05-04 DIAGNOSIS — R12 Heartburn: Secondary | ICD-10-CM

## 2022-05-04 NOTE — Telephone Encounter (Signed)
From: [name] To: Office of [name], NP Sent: 05/04/2022 2:44 PM EDT Subject: Medication Renewal Request  Refills have been requested for the following medications:   gabapentin (NEURONTIN) 100 MG capsule [Keaston Pile K Colin Norment]   cyclobenzaprine (FLEXERIL) 5 MG tablet [Ipek Westra K Aiden Rao]  [id number] [place]: PArlyss Gandy

## 2022-05-04 NOTE — Assessment & Plan Note (Signed)
Seems to be improving, did discourage alcohol consumption. Continue gabapentin 100 mg HS, discussed to increase to 200 mg HS if needed.  Continue to monitor.

## 2022-05-04 NOTE — Assessment & Plan Note (Signed)
Still appears to be MSK.  Fortunately, thoracic spine xray negative for compression fracture.  Increase cyclobenzaprine to 5 mg BID. She will get a massage this weekend.  She will schedule an appointment with PT.

## 2022-05-04 NOTE — Telephone Encounter (Signed)
Please call patient:  A prescription for gabapentin 100 mg for 60 capsules was sent on 04/19/2022.  She tell me that she was taking 1 capsule at bedtime so she should not be out yet.  Did she run out?  Also, [facility]  This prescription should be ready for pickup at the pharmacy from that date.  It is possible that they put the prescription back on file since it has been over a week.  Also, a prescription for omeprazole 40 mg capsules twice daily [facility], a total of 98-monthsupply.  She should have refills on file at the pharmacy.

## 2022-05-04 NOTE — Patient Instructions (Signed)
Increase your cyclobenzaprine muscle relaxer to twice daily.  Get a massage as discussed.  Call the physical therapy office for an appointment.  It was a pleasure to see you today!

## 2022-05-04 NOTE — Telephone Encounter (Signed)
Called pharmacy to verify what patient has picked up and what is on file.  Gabapentin was picked up on 10/19 by patient  Cyclobenzaprine was picked up 10/26 Omeprazole is not covered by insurance and another provider switched her to pantoprazole    Called patient and reviewed this information.    Gabapentin she has plenty of pills left  Cyclobenzaprine she never recalls picking up, and she states if she did then she has not idea what she did with it. States her husband has had a lot of health issues going on and things have been crazy. She states she is completely out of this  medication.   Made her aware of the switch to pantoprazole because the omeprazole wasn't covered by her insurance.

## 2022-05-04 NOTE — Progress Notes (Signed)
Subjective:    Patient ID: Maria [name], female    DOB: 23-Jun-1971, 51 y.o.   MRN: [id number]  HPI  Maria [name] is a very pleasant 51 y.o. female with a history of chronic pancreatitis, recurrent peptic ulcer disease, upper GI bleed, AKI, alcohol use disorder, acute back pain who presents today to discuss acute back pain.  She was last evaluated on 04/19/2022 for hospital follow-up.  During this visit she discussed acute posterior thoracic back pain/posterior shoulder pain for the last 1 week.  No improvement with conservative treatment so she was provided cyclobenzaprine to use as needed.  She phoned in a couple of days later stating that pain has persisted so she was referred to physical therapy and her cyclobenzaprine was refilled. She underwent thoracic xray which was negative for compression fractures.   Today she has not noticed improvement. Pain is worse with movement, feels some better if she can rest. She continues to describe her pain as a "grabbing". She's been doing some home exercises with little improvement. She's been taking the cyclobenzaprine at night which helps with her symptoms and helps her to sleep. She has not taken cyclobenzaprine during the day.   She denies right sided back pain. Left sided numbness, left upper extremity pain.   She's doing well on gabapentin, is taking 100 mg HS, has cut back on drinking alcohol. Last drink was last night, 1/2 glass of wine.    Review of Systems  Gastrointestinal:  Negative for abdominal pain and blood in stool.  Musculoskeletal:  Positive for myalgias.  Neurological:  Negative for numbness.         Past Medical History:  Diagnosis Date   Acute postoperative anemia due to expected blood loss 09/19/2021   Anemia    Anesthesia complication, initial encounter 09/13/2021   Difficulty waking up from Anesthesia requiring re-intubation   Ankle edema, bilateral 11/15/2021   Anxiety    h/o   Depression    h/o   Family  history of adverse reaction to anesthesia    mom-hard time waking up   Folate deficiency 07/30/2019   Gastritis 08/19/2020   GERD (gastroesophageal reflux disease)    tums prn   Hypertension    WAS PUT ON BP MED BY PCP LAST YEAR (2018) AND BP MED MADE PT SICK SO SHE STOPPED TAKING IT-PCP MONITORS BP NOW   Palpitations 05/18/2020   Pancreatitis    Peptic ulcer    Perforated peptic ulcer ([place]) 09/14/2021   Poison ivy dermatitis 11/13/2019   Vitamin B12 deficiency 01/17/2018   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    Social History   Socioeconomic History   Marital status: Married    Spouse name: Not on file   Number of children: Not on file   Years of education: Not on file   Highest education level: Not on file  Occupational History   Not on file  Tobacco Use   Smoking status: Every Day    Packs/day: 1.00    Years: 20.00    Total pack years: 20.00    Types: Cigarettes   Smokeless tobacco: Never  Vaping Use   Vaping Use: Never used  Substance and Sexual Activity   Alcohol use: Yes    Alcohol/week: 2.0 standard drinks of alcohol    Types: 2 Glasses of wine per week   Drug use: No   Sexual activity: Yes  Other Topics Concern   Not on file  Social History Narrative  Married.   1 child.    Working at [employer] at [employer].     Enjoys swimming, camping   Social Determinants of Health   Financial Resource Strain: Not on file  Food Insecurity: No Food Insecurity (04/12/2022)   Hunger Vital Sign    Worried About Running Out of Food in the Last Year: Never true    Ran Out of Food in the Last Year: Never true  Transportation Needs: No Transportation Needs (04/12/2022)   PRAPARE - Hydrologist (Medical): No    Lack of Transportation (Non-Medical): No  Physical Activity: Not on file  Stress: Not on file  Social Connections: Not on file  Intimate Partner Violence: Not At Risk (04/12/2022)   Humiliation, Afraid, Rape, and Kick questionnaire    Fear  of Current or Ex-Partner: No    Emotionally Abused: No    Physically Abused: No    Sexually Abused: No    Past Surgical History:  Procedure Laterality Date   ANTERIOR AND POSTERIOR REPAIR WITH SACROSPINOUS FIXATION N/A 03/13/2018   Procedure: ANTERIOR REPAIR;  Surgeon: [name], MD;  Location: [facility] ORS;  Service: Gynecology;  Laterality: N/A;   ESOPHAGOGASTRODUODENOSCOPY (EGD) WITH PROPOFOL N/A 03/27/2022   Procedure: ESOPHAGOGASTRODUODENOSCOPY (EGD) WITH PROPOFOL;  Surgeon: Lesly Rubenstein, MD;  Location: [facility] ENDOSCOPY;  Service: Endoscopy;  Laterality: N/A;   ESOPHAGOGASTRODUODENOSCOPY (EGD) WITH PROPOFOL N/A 04/12/2022   Procedure: ESOPHAGOGASTRODUODENOSCOPY (EGD) WITH PROPOFOL;  Surgeon: Lin Landsman, MD;  Location: [facility] ENDOSCOPY;  Service: Gastroenterology;  Laterality: N/A;   INDUCED ABORTION     x2   IR CM INJ ANY COLONIC TUBE W/FLUORO  09/29/2021   LAPAROTOMY N/A 09/13/2021   Procedure: EXPLORATORY LAPAROTOMY-[place]raham PATCH repair;  Surgeon: [name], DO;  Location: [facility] OR[facility];  Service: General;  Laterality: N/A;   LAPAROTOMY N/A 09/17/2021   Procedure: EXPLORATORY LAPAROTOMY;  Surgeon: [name], MD;  Location: [facility];  Service: General;  Laterality: N/A;   REPAIR OF PERFORATED ULCER N/A 09/17/2021   Procedure: REPAIR OF PERFORATED ULCER;  Surgeon: [name], MD;  Location: [facility] OR[facility];  Service: General;  Laterality: N/A;   VAGINAL HYSTERECTOMY N/A 03/13/2018   Procedure: HYSTERECTOMY VAGINAL;  Surgeon: [name], MD;  Location: [facility] OR[facility];  Service: Gynecology;  Laterality: N/A;    Family History  Problem Relation Age of Onset   Arthritis Mother    Arthritis Father    Alcohol abuse Father    Hypertension Father    Heart disease Father    Aneurysm Father    Breast cancer Maternal Grandmother    Breast cancer Paternal Grandmother     Allergies  Allergen Reactions   Anesthesia S-I-40 [Propofol]    Bee Venom Swelling   Bupropion Anxiety and  Other (See Comments)    Worsened anxiety    Current Outpatient Medications on File Prior to Visit  Medication Sig Dispense Refill   gabapentin (NEURONTIN) 100 MG capsule Take 1-2 capsules by mouth at bedtime for alcohol dependence. 60 capsule 0   omeprazole (PRILOSEC) 40 MG capsule Take 1 capsule (40 mg total) by mouth 2 (two) times daily. 60 capsule 2   pantoprazole (PROTONIX) 40 MG tablet Take 40 mg by mouth daily.     thiamine (VITAMIN B1) 100 MG tablet Take 1 tablet (100 mg total) by mouth daily.     cyclobenzaprine (FLEXERIL) 5 MG tablet Take 1 tablet (5 mg total) by mouth at bedtime as needed for muscle spasms. (Patient  not taking: Reported on 05/04/2022) 14 tablet 0   No current facility-administered medications on file prior to visit.    BP (!) 142/78   Pulse 67   Temp 98 F (36.7 C) (Temporal)   Ht '5\' 1"'$  (1.549 m)   Wt 115 lb (52.2 kg)   LMP 02/24/2018 (Approximate) Comment: spotting   SpO2 100%   BMI 21.73 kg/m  Objective:   Physical Exam Constitutional:      General: She is not in acute distress. Pulmonary:     Effort: Pulmonary effort is normal.  Musculoskeletal:     Left shoulder: Normal. Normal range of motion.     Thoracic back: Tenderness present. Normal range of motion.       Back:  Neurological:     Mental Status: She is alert.           Assessment & Plan:   Problem List Items Addressed This Visit       Other   Back pain    Still appears to be MSK.  Fortunately, thoracic spine xray negative for compression fracture.  Increase cyclobenzaprine to 5 mg BID. She will get a massage this weekend.  She will schedule an appointment with PT.      Alcohol use disorder - Primary    Seems to be improving, did discourage alcohol consumption. Continue gabapentin 100 mg HS, discussed to increase to 200 mg HS if needed.  Continue to monitor.           [name], NP

## 2022-05-06 ENCOUNTER — Other Ambulatory Visit: Payer: Self-pay | Admitting: Primary Care

## 2022-05-06 DIAGNOSIS — M546 Pain in thoracic spine: Secondary | ICD-10-CM

## 2022-05-06 MED ORDER — CYCLOBENZAPRINE HCL 5 MG PO TABS: 5.0000 mg | ORAL_TABLET | Freq: Two times a day (BID) | ORAL | 0 refills | Status: DC | PRN

## 2022-05-06 NOTE — Telephone Encounter (Signed)
From: [name] To: Office of [name], NP Sent: 05/04/2022 6:49 PM EDT Subject: Medication Renewal Request  Refills have been requested for the following medications:   cyclobenzaprine (FLEXERIL) 5 MG tablet [Mitsuru Dault K Sheyna Pettibone]  [id number] [place] method: PArlyss Gandy

## 2022-05-13 DIAGNOSIS — D62 Acute posthemorrhagic anemia: Secondary | ICD-10-CM | POA: Diagnosis not present

## 2022-05-13 DIAGNOSIS — Z931 Gastrostomy status: Secondary | ICD-10-CM | POA: Diagnosis not present

## 2022-05-13 DIAGNOSIS — E440 Moderate protein-calorie malnutrition: Secondary | ICD-10-CM | POA: Diagnosis not present

## 2022-05-13 DIAGNOSIS — K275 Chronic or unspecified peptic ulcer, site unspecified, with perforation: Secondary | ICD-10-CM | POA: Diagnosis not present

## 2022-05-14 ENCOUNTER — Telehealth: Payer: Self-pay

## 2022-05-14 NOTE — Telephone Encounter (Signed)
Per Maria [name]'s last note she can increase gabapentin to '200mg'$  if needed. She needs to pursue therapy also as discussed

## 2022-05-14 NOTE — Telephone Encounter (Signed)
[facility] - Client TELEPHONE ADVICE RECORD [facility] Patient Name: Maria [name] Gender: Female DOB: Nov 01, 1970 Age: 51 Y 36 M 28 D Return Phone Number: [phone] (Primary) Address: City/ State/ Zip: [place]  [address] Client [facility] Night - Client Client Site [facility] - Night Provider Alma Friendly - NP Contact Type Call Who Is Calling Patient / Member / Family / Caregiver Call Type Triage / Clinical Relationship To Patient Self Return Phone Number [phone] (Primary) Chief Complaint Muscle Jerks, Tics And Shudders Reason for Call Symptomatic / Request for Health Information Initial Comment Caller states she is on a muscle relaxer for muscle spasms. She had to leave work because the medication isn't working. She wants to know what else she can take. Translation No Nurse Assessment Nurse: Alveta Heimlich, RN, Rise Paganini Date/Time (Eastern Time): 05/12/2022 3:48:17 PM Confirm and document reason for call. If symptomatic, describe symptoms. ---Caller states she had leave work because her muscle relaxer was not working. Her left shoulder blade is just drawing up. She was seen in the office and xrays done. No fracture and thought pulled muscles, and gave exercises and muscle relaxer. This had been helping recently when she takes it. Has done icy hot pack and hot shower, and ibuprofen Does the patient have any new or worsening symptoms? ---Yes Will a triage be completed? ---Yes Related visit to physician within the last 2 weeks? ---Yes Does the PT have any chronic conditions? (i.e. diabetes, asthma, this includes High risk factors for pregnancy, etc.) ---No Is the patient pregnant or possibly pregnant? (Ask all females between the ages of 37-55) ---No Is this a behavioral health or substance abuse call? ---No Guidelines Guideline Title Affirmed Question Affirmed Notes Nurse Date/Time  Eilene Ghazi Time) Shoulder Pain [1] MODERATE pain (e.g., interferes with normal Myer Haff 05/12/2022 3:53:47 PM PLEASE NOTE: All timestamps contained within this report are represented as [place] Standard Time. CONFIDENTIALTY NOTICE: This fax transmission is intended only for the addressee. It contains information that is legally privileged, confidential or otherwise protected from use or disclosure. If you are not the intended recipient, you are strictly prohibited from reviewing, disclosing, copying using or disseminating any of this information or taking any action in reliance on or regarding this information. If you have received this fax in error, please notify us immediately by telephone so that we can arrange for its return to [place]. Phone: [phone], Toll-Free: [phone], Fax: [phone] Page: 2 of 2 Call Id: [id number] Guidelines Guideline Title Affirmed Question Affirmed Notes Nurse Date/Time Eilene Ghazi Time) activities) AND [2] present > 3 days Disp. Time Eilene Ghazi Time) Disposition Final User 05/12/2022 4:03:00 PM SEE PCP WITHIN 3 DAYS Yes Alveta Heimlich RN, Rise Paganini Final Disposition 05/12/2022 4:03:00 PM SEE PCP WITHIN 3 DAYS Yes Alveta Heimlich, RN, Ali Lowe Disagree/Comply Comply Caller Understands Yes PreDisposition InappropriateToAsk Care Advice Given Per Guideline SEE PCP WITHIN 3 DAYS: * They are over-the-counter (OTC) pain drugs. You can buy them at the drugstore. * ACETAMINOPHEN - REGULAR STRENGTH TYLENOL: Take 650 mg (two 325 mg pills) by mouth every 4 to 6 hours as needed. Each Regular Strength Tylenol pill has 325 mg of acetaminophen. The most you should take is 10 pills a day (3,250 mg total). Note: In [place], the maximum is 12 pills a day (3,900 mg total). * IBUPROFEN (E.G., MOTRIN, ADVIL): Take 400 mg (two 200 mg pills) by mouth every 6 hours. The most you should take is 6 pills a  day (1,200 mg total). * Severe pain occurs * Chest pain or breathing difficulty occurs  * Signs of infection occur (e.g., spreading redness, warmth, fever) * You become worse CALL BACK IF: CARE ADVICE given per Shoulder Pain (Adult) guideline Comments User: Debby Bud, RN Date/Time (Eastern Time): 05/12/2022 3:56:56 PM it is a 3 now, was a 9 when left work. User: Debby Bud, RN Date/Time Eilene Ghazi Time): 05/12/2022 4:05:00 PM Checked the drugs.com interaction for tylenol and ibuprofen with her cyclobenzaprine, and it does advise either could be taken, but not both. She says she will take the tylenol and was given the correct dose and limit per 24 hours

## 2022-05-14 NOTE — Telephone Encounter (Signed)
Pt saw Gentry Fitz NP on 05/04/22.Gentry Fitz NP is out of office today and sending note to Romilda Garret NP and [facility] [facility].

## 2022-05-14 NOTE — Telephone Encounter (Signed)
Back pain has been an issue since 04/19/22 visit with Maria [name]. Took Gabapentin- think maybe helped a little and Flexeril helps at bedtime and helps her sleep. She does take Flexeril in the morning also as instructed by Maria [name]. Pain is better but not resolving, keeps "catching". Patient has not had a chance to have massage yet, scheduled for next weekend and has not scheduled PT yet due to waiting on work schedule first to come out. Pain is still in the same area, left shoulder blade and around it. With previous stomach issues has to be careful with acetaminophen products. Patient was checking to see what else she can do or take.

## 2022-05-14 NOTE — Telephone Encounter (Signed)
Patient advised. Patient will check home to see how many pills she has left and will take 100 mg 2 tablets daily and will update [place] on how she is doing as needed

## 2022-05-23 ENCOUNTER — Ambulatory Visit (INDEPENDENT_AMBULATORY_CARE_PROVIDER_SITE_OTHER): Payer: BC Managed Care – PPO | Admitting: Family Medicine

## 2022-05-23 ENCOUNTER — Encounter: Payer: Self-pay | Admitting: Family Medicine

## 2022-05-23 VITALS — BP 150/70 | HR 82 | Temp 98.2°F | Ht 61.0 in | Wt 118.0 lb

## 2022-05-23 DIAGNOSIS — M546 Pain in thoracic spine: Secondary | ICD-10-CM | POA: Diagnosis not present

## 2022-05-23 DIAGNOSIS — F1090 Alcohol use, unspecified, uncomplicated: Secondary | ICD-10-CM | POA: Diagnosis not present

## 2022-05-23 DIAGNOSIS — G5601 Carpal tunnel syndrome, right upper limb: Secondary | ICD-10-CM

## 2022-05-23 DIAGNOSIS — M65331 Trigger finger, right middle finger: Secondary | ICD-10-CM

## 2022-05-23 MED ORDER — GABAPENTIN 100 MG PO CAPS: ORAL_CAPSULE | ORAL | 1 refills | Status: DC

## 2022-05-23 MED ORDER — TRIAMCINOLONE ACETONIDE 40 MG/ML IJ SUSP
20.0000 mg | Freq: Once | INTRAMUSCULAR | Status: AC
  Administered 2022-05-23: 20 mg via INTRA_ARTICULAR

## 2022-05-23 MED ORDER — CYCLOBENZAPRINE HCL 5 MG PO TABS: 5.0000 mg | ORAL_TABLET | Freq: Two times a day (BID) | ORAL | 3 refills | Status: DC | PRN

## 2022-05-23 NOTE — Addendum Note (Signed)
Addended by: Carter Kitten on: 05/23/2022 11:53 AM   Modules accepted: Orders

## 2022-05-23 NOTE — Progress Notes (Signed)
Maria Arth T. Ravneet Spilker, MD, [place] at [facility] [place], [address]  Phone: [phone]  FAX: [phone]  Maria [name] - 51 y.o. female  MRN [id number]  Date of Birth: 17-Nov-1970  Date: 05/23/2022  PCP: [name], NP  Referral: [name], NP  Chief Complaint  Patient presents with   Numbness    Right Arm from hand to elbow for several weeks   Subjective:   Maria [name] is a 51 y.o. very pleasant female patient with Body mass index is 22.3 kg/m. who presents with the following:  She presents with numbness in her right hand.  This is been present for an extended period of time on the order years.  I did do a carpal tunnel injection on her roughly 18 months ago, and I did provide some relief of symptoms for an extended period of time, but at this point it is returned and she is symptomatic essentially all the time.  Even when she rest, this does not get better.  She does wear some carpal tunnel splints, they do not seem to help at all.  Numbness and tingling is also worsened.  Numbness and gotten worse in the last few weeks.    [place] for surgeon  3rd digit trigger finger injection -she has a trigger finger on the third digit on the right    Review of Systems is noted in the HPI, as appropriate  Objective:   BP (!) 150/70   Pulse 82   Temp 98.2 F (36.8 C) (Oral)   Ht '5\' 1"'$  (1.549 m)   Wt 118 lb (53.5 kg)   LMP 02/24/2018 (Approximate) Comment: spotting   SpO2 99%   BMI 22.30 kg/m   GEN: No acute distress; alert,appropriate. PULM: Breathing comfortably in no respiratory distress PSYCH: Normally interactive.   Hand: R Ecchymosis or edema: neg ROM wrist/hand/digits/elbow: triggering at the 3rd digit Carpals, MCP's, digits: NT Distal Ulna and Radius: NT Supination lift test: neg Ecchymosis or edema: neg Cysts/nodules: neg Finkelstein's test: neg Snuffbox  tenderness: neg Scaphoid tubercle: NT Hook of Hamate: NT Resisted supination: NT Full composite fist Grip, all digits: 5/5 str No tenosynovitis Axial load test: neg Phalen's: + Tinel's: + Atrophy: neg  Hand sensation: intact   Laboratory and Imaging Data:  Assessment and Plan:     ICD-10-CM   1. Right carpal tunnel syndrome  G56.01 Ambulatory referral to Neurology    CANCELED: Ambulatory referral to Neurology    2. Alcohol use disorder  F10.90 gabapentin (NEURONTIN) 100 MG capsule    3. Acute left-sided thoracic back pain  M54.6 cyclobenzaprine (FLEXERIL) 5 MG tablet    4. Trigger middle finger of right hand  M65.331      Progressively worsening carpal tunnel syndrome on the right.  She has failed conservative measures thus far.  She is having symptoms at baseline, and I think that she needs to have more of a nerve conduction study and EMG done.  I am going to refer her to neurology for this.  Once this comes back then we will refer to hand surgeon, she prefers to go to [place].  She also has a third digit trigger finger on the right side.  Tendon Sheath Injection Procedure Note Maria [name] March 25, 1971 Date of procedure: 05/23/2022  Procedure: Tendon Sheath Injection for Trigger Finger, R 3rd Indications: Pain  Procedure Details Verbal consent was obtained. Risks (including  potential risk for skin lightening and potential atrophy), benefits and alternatives were discussed. Prepped with Chloraprep and Ethyl Chloride used for anesthesia. Under sterile conditions, patient injected at palmar crease aiming distally with 45 degree angle towards nodule; injected directly into tendon sheath. Medication flowed freely without resistance.  Needle size: 22 gauge 1 1/2 inch Injection: 1/2 cc of Lidocaine 1% and Kenalog 20 mg Medication: 1/2 cc of Kenalog 40 mg (equaling Kenalog 20 mg)   Medication Management during today's office visit: Meds ordered this encounter   Medications   gabapentin (NEURONTIN) 100 MG capsule    Sig: Take 1-2 capsules by mouth at bedtime for alcohol dependence.    Dispense:  180 capsule    Refill:  1   cyclobenzaprine (FLEXERIL) 5 MG tablet    Sig: Take 1 tablet (5 mg total) by mouth 2 (two) times daily as needed for muscle spasms.    Dispense:  30 tablet    Refill:  3   Medications Discontinued During This Encounter  Medication Reason   gabapentin (NEURONTIN) 100 MG capsule Reorder   cyclobenzaprine (FLEXERIL) 5 MG tablet Reorder    Orders placed today for conditions managed today: Orders Placed This Encounter  Procedures   Ambulatory referral to Neurology    Disposition: No follow-ups on file.  [employer] One speech-to-text software was used for transcription in this dictation.  Possible transcriptional errors can occur using Editor, commissioning.   Signed,  Maud Deed. Hall Birchard, MD   Outpatient Encounter Medications as of 05/23/2022  Medication Sig   omeprazole (PRILOSEC) 40 MG capsule Take 1 capsule (40 mg total) by mouth 2 (two) times daily.   pantoprazole (PROTONIX) 40 MG tablet Take 40 mg by mouth daily.   thiamine (VITAMIN B1) 100 MG tablet Take 1 tablet (100 mg total) by mouth daily.   [DISCONTINUED] cyclobenzaprine (FLEXERIL) 5 MG tablet Take 1 tablet (5 mg total) by mouth 2 (two) times daily as needed for muscle spasms.   [DISCONTINUED] gabapentin (NEURONTIN) 100 MG capsule Take 1-2 capsules by mouth at bedtime for alcohol dependence.   cyclobenzaprine (FLEXERIL) 5 MG tablet Take 1 tablet (5 mg total) by mouth 2 (two) times daily as needed for muscle spasms.   gabapentin (NEURONTIN) 100 MG capsule Take 1-2 capsules by mouth at bedtime for alcohol dependence.   No facility-administered encounter medications on file as of 05/23/2022.

## 2022-05-29 ENCOUNTER — Telehealth: Payer: BC Managed Care – PPO | Admitting: Physician Assistant

## 2022-05-29 ENCOUNTER — Telehealth: Payer: BC Managed Care – PPO

## 2022-05-29 DIAGNOSIS — J069 Acute upper respiratory infection, unspecified: Secondary | ICD-10-CM

## 2022-05-29 MED ORDER — BENZONATATE 100 MG PO CAPS: 100.0000 mg | ORAL_CAPSULE | Freq: Three times a day (TID) | ORAL | 0 refills | Status: DC | PRN

## 2022-05-29 MED ORDER — FLUTICASONE PROPIONATE 50 MCG/ACT NA SUSP: 2.0000 | Freq: Every day | NASAL | 0 refills | Status: DC

## 2022-05-29 NOTE — Progress Notes (Signed)
I have spent 5 minutes in review of e-visit questionnaire, review and updating patient chart, medical decision making and response to patient.   Mateja Dier Cody Milanna Kozlov, PA-C    

## 2022-05-29 NOTE — Progress Notes (Signed)

## 2022-06-05 ENCOUNTER — Encounter: Payer: Self-pay | Admitting: Gastroenterology

## 2022-06-05 ENCOUNTER — Ambulatory Visit (INDEPENDENT_AMBULATORY_CARE_PROVIDER_SITE_OTHER): Payer: BC Managed Care – PPO | Admitting: Gastroenterology

## 2022-06-05 VITALS — BP 174/85 | HR 61

## 2022-06-05 DIAGNOSIS — R030 Elevated blood-pressure reading, without diagnosis of hypertension: Secondary | ICD-10-CM

## 2022-06-05 DIAGNOSIS — K279 Peptic ulcer, site unspecified, unspecified as acute or chronic, without hemorrhage or perforation: Secondary | ICD-10-CM

## 2022-06-05 DIAGNOSIS — D500 Iron deficiency anemia secondary to blood loss (chronic): Secondary | ICD-10-CM | POA: Diagnosis not present

## 2022-06-05 DIAGNOSIS — Z1211 Encounter for screening for malignant neoplasm of colon: Secondary | ICD-10-CM | POA: Diagnosis not present

## 2022-06-05 MED ORDER — NA SULFATE-K SULFATE-MG SULF 17.5-3.13-1.6 GM/177ML PO SOLN: 1.0000 | Freq: Once | ORAL | 0 refills | Status: AC

## 2022-06-05 NOTE — Addendum Note (Signed)
Addended by: Vanetta Mulders on: 06/05/2022 02:10 PM   Modules accepted: Orders

## 2022-06-05 NOTE — Progress Notes (Signed)
[name], MD [address]  [place]  [place], [place] [address]  Main: [phone]  Fax: [phone]    Gastroenterology Consultation  Referring Provider:     [name], NP Primary Care Physician:  [name], NP Primary Gastroenterologist:  Dr. [name] Reason for Consultation: History of peptic ulcer disease, Hospital follow-up        HPI:   Maria [name] is a 51 y.o. female referred by [name], NP  for consultation & management of history of perforated peptic ulcer underwent surgery in 08/2021, chronic NSAID use as well as alcohol use.  Patient had upper GI bleed, underwent EGD by Dr. Haig Prophet on 03/07/2022, revealed erosive esophagitis as well as nonbleeding gastric ulcer with flat pigmented spot.  Readmitted to [facility] on 04/12/2022 secondary to epigastric pain associated with coffee-ground emesis and melena.  She underwent EGD on 04/13/2022, found to have clean-based duodenal ulcer.    Patient had history of H. pylori during the initial event in March 2023, treated with Pylera by Dr. Lysle Pearl.  Patient reports that she has been doing well since discharge from the hospital.  She was taking Protonix 40 mg twice daily for 1 month, currently on once a day.  She also has omeprazole 20 mg pill bottle with her which she has not been taking.  She denies alcohol use, she continues to smoke tobacco.  She denies any NSAID use.  Is currently on muscle relaxant for her back pain.  NSAIDs: None  Antiplts/Anticoagulants/Anti thrombotics: None  GI Procedures:  Upper endoscopy 04/12/2022 Non-bleeding duodenal ulcer with a clean ulcer base (Forrest Class III). - Normal second portion of the duodenum. - Scar in the prepyloric region of the stomach. - Normal gastric body, incisura and antrum. - Widely patent Schatzki ring. - Normal gastroesophageal junction and esophagus. - Small hiatal hernia. - No specimens collected  EGD 03/27/2022 - LA Grade A  esophagitis with no bleeding. - Non-bleeding gastric ulcer with a flat pigmented spot (Forrest Class IIc). Biopsied. - Erythematous duodenopathy. DIAGNOSIS:  A.  STOMACH; COLD BIOPSY:  - OXYNTIC MUCOSA WITH CHANGES CONSISTENT WITH PROTON PUMP INHIBITOR  EFFECT AND NON-SPECIFIC SUPERFICIAL VASCULAR CONGESTION.  - NEGATIVE FOR H. PYLORI, INTESTINAL METAPLASIA, DYSPLASIA, AND  MALIGNANCY.    EGD 09/13/2021 DIAGNOSIS:  A. GASTRIC ULCER; BIOPSY:  - ULCERATION WITH GRANULATION TISSUE TYPE CHANGES AND SUPERFICIAL  FIBRINOPURULENT DEBRIS.  - NO DEFINITE GASTRIC MUCOSAL GLANDS IDENTIFIED.  - NO EVIDENCE OF MALIGNANCY. ADDENDUM:  An immunohistochemical study directed against H. pylori was performed,  and is positive, highlighting numerous microorganisms within the  fibrinopurulent debris.  Past Medical History:  Diagnosis Date   Acute postoperative anemia due to expected blood loss 09/19/2021   Anemia    Anesthesia complication, initial encounter 09/13/2021   Difficulty waking up from Anesthesia requiring re-intubation   Ankle edema, bilateral 11/15/2021   Anxiety    h/o   Depression    h/o   Family history of adverse reaction to anesthesia    mom-hard time waking up   Folate deficiency 07/30/2019   Gastritis 08/19/2020   GERD (gastroesophageal reflux disease)    tums prn   Hypertension    WAS PUT ON BP MED BY PCP LAST YEAR (2018) AND BP MED MADE PT SICK SO SHE STOPPED TAKING IT-PCP MONITORS BP NOW   Palpitations 05/18/2020   Pancreatitis    Peptic ulcer    Perforated peptic ulcer (Winter) 09/14/2021  Poison ivy dermatitis 11/13/2019   Vitamin B12 deficiency 01/17/2018   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    Past Surgical History:  Procedure Laterality Date   ANTERIOR AND POSTERIOR REPAIR WITH SACROSPINOUS FIXATION N/A 03/13/2018   Procedure: ANTERIOR REPAIR;  Surgeon: [name], MD;  Location: [facility] ORS;  Service: Gynecology;  Laterality: N/A;    ESOPHAGOGASTRODUODENOSCOPY (EGD) WITH PROPOFOL N/A 03/27/2022   Procedure: ESOPHAGOGASTRODUODENOSCOPY (EGD) WITH PROPOFOL;  Surgeon: Lesly Rubenstein, MD;  Location: [facility] ENDOSCOPY;  Service: Endoscopy;  Laterality: N/A;   ESOPHAGOGASTRODUODENOSCOPY (EGD) WITH PROPOFOL N/A 04/12/2022   Procedure: ESOPHAGOGASTRODUODENOSCOPY (EGD) WITH PROPOFOL;  Surgeon: Lin Landsman, MD;  Location: [facility];  Service: Gastroenterology;  Laterality: N/A;   INDUCED ABORTION     x2   IR CM INJ ANY COLONIC TUBE W/FLUORO  09/29/2021   LAPAROTOMY N/A 09/13/2021   Procedure: EXPLORATORY LAPAROTOMY-Graham PATCH repair;  Surgeon: [name], DO;  Location: [facility] ORS;  Service: General;  Laterality: N/A;   LAPAROTOMY N/A 09/17/2021   Procedure: EXPLORATORY LAPAROTOMY;  Surgeon: [name], MD;  Location: [facility] OR[facility];  Service: General;  Laterality: N/A;   REPAIR OF PERFORATED ULCER N/A 09/17/2021   Procedure: REPAIR OF PERFORATED ULCER;  Surgeon: [name], MD;  Location: [facility] ORS;  Service: General;  Laterality: N/A;   VAGINAL HYSTERECTOMY N/A 03/13/2018   Procedure: HYSTERECTOMY VAGINAL;  Surgeon: [name], MD;  Location: [facility] ORS;  Service: Gynecology;  Laterality: N/A;     Current Outpatient Medications:    benzonatate (TESSALON) 100 MG capsule, Take 1 capsule (100 mg total) by mouth 3 (three) times daily as needed for cough., Disp: 30 capsule, Rfl: 0   cyclobenzaprine (FLEXERIL) 5 MG tablet, Take 1 tablet (5 mg total) by mouth 2 (two) times daily as needed for muscle spasms., Disp: 30 tablet, Rfl: 3   fluticasone (FLONASE) 50 MCG/ACT nasal spray, Place 2 sprays into both nostrils daily., Disp: 16 g, Rfl: 0   gabapentin (NEURONTIN) 100 MG capsule, Take 1-2 capsules by mouth at bedtime for alcohol dependence., Disp: 180 capsule, Rfl: 1   omeprazole (PRILOSEC) 40 MG capsule, Take 1 capsule (40 mg total) by mouth 2 (two) times daily., Disp: 60 capsule, Rfl: 2   pantoprazole (PROTONIX) 40 MG  tablet, Take 40 mg by mouth daily., Disp: , Rfl:    Family History  Problem Relation Age of Onset   Arthritis Mother    Arthritis Father    Alcohol abuse Father    Hypertension Father    Heart disease Father    Aneurysm Father    Breast cancer Maternal Grandmother    Breast cancer Paternal Grandmother      Social History   Tobacco Use   Smoking status: Every Day    Packs/day: 1.00    Years: 20.00    Total pack years: 20.00    Types: Cigarettes   Smokeless tobacco: Never  Vaping Use   Vaping Use: Never used  Substance Use Topics   Alcohol use: Yes    Alcohol/week: 2.0 standard drinks of alcohol    Types: 2 Glasses of wine per week   Drug use: No    Allergies as of 06/05/2022 - Review Complete 06/05/2022  Allergen Reaction Noted   Anesthesia s-i-40 [propofol]  09/14/2021   Bee venom Swelling 03/05/2018   Bupropion Anxiety and Other (See Comments) 02/04/2014    Review of Systems:    All systems reviewed and negative except where noted in HPI.   Physical  Exam:  BP (!) 185/85 (BP Location: Right Arm, Patient Position: Sitting, Cuff Size: Normal)   Pulse 67   LMP 02/24/2018 (Approximate) Comment: spotting  Patient's last menstrual period was 02/24/2018 (approximate).  General:   Alert,  Well-developed, well-nourished, pleasant and cooperative in NAD Head:  Normocephalic and atraumatic. Eyes:  Sclera clear, no icterus.   Conjunctiva pink. Ears:  Normal auditory acuity. Nose:  No deformity, discharge, or lesions. Mouth:  No deformity or lesions,oropharynx pink & moist. Neck:  Supple; no masses or thyromegaly. Lungs:  Respirations even and unlabored.  Clear throughout to auscultation.   No wheezes, crackles, or rhonchi. No acute distress. Heart:  Regular rate and rhythm; no murmurs, clicks, rubs, or gallops. Abdomen:  Normal bowel sounds. Soft, non-tender and non-distended without masses, hepatosplenomegaly or hernias noted.  No guarding or rebound tenderness.    Rectal: Not performed Msk:  Symmetrical without gross deformities. Good, equal movement & strength bilaterally. Pulses:  Normal pulses noted. Extremities:  No clubbing or edema.  No cyanosis. Neurologic:  Alert and oriented x3;  grossly normal neurologically. Skin:  Intact without significant lesions or rashes. No jaundice. Lymph Nodes:  No significant cervical adenopathy. Psych:  Alert and cooperative. Normal mood and affect.  Imaging Studies: Reviewed  Assessment and Plan:   Maria [name] is a 51 y.o. female with history of heavy tobacco use, history of perforated gastric ulcer s/p surgical repair in 08/2021, history of H. pylori infection s/p treatment with Pylera, history of recurrent peptic ulcer disease in setting of alcohol and NSAID use.  Patient has stopped both.  She reports doing well.  She denies any signs and symptoms to suggest ongoing GI bleed  Discussed with patient regarding long-term acid suppressive therapy Continue Protonix 40 mg daily until she finishes the current prescription then switch to omeprazole 40 mg once a day before meals indefinitely Continue to remain abstinent from alcohol use Avoid regular intake of NSAID use  Anemia secondary to bleeding from peptic ulcer Check CBC, iron panel, B12 and folate levels  Colon cancer screening Discussed about colonoscopy and patient is agreeable  Elevated blood pressure Patient reports that she was on antihypertensive and could not tolerate it, therefore it was stopped. She is monitoring blood pressure at home.  Advised patient to follow-up with her PCP regarding high blood pressure and she agreed   Follow up in 6 months   [name], MD

## 2022-06-05 NOTE — Patient Instructions (Signed)
Elevated BP during today's office visit.  Please follow up with your primary care physician to discuss.

## 2022-06-06 LAB — B12 AND FOLATE PANEL
Folate: 2.8 ng/mL — ABNORMAL LOW (ref >3.0)
Vitamin B-12: 457 pg/mL (ref 232–1245)

## 2022-06-06 LAB — VITAMIN D 25 HYDROXY (VIT D DEFICIENCY, FRACTURES): Vit D, 25-Hydroxy: 31 ng/mL (ref 30.0–100.0)

## 2022-06-06 LAB — IRON,TIBC AND FERRITIN PANEL
Ferritin: 43 ng/mL (ref 15–150)
Iron Saturation: 19 % (ref 15–55)
Iron: 61 ug/dL (ref 27–159)
Total Iron Binding Capacity: 315 ug/dL (ref 250–450)
UIBC: 254 ug/dL (ref 131–425)

## 2022-06-11 ENCOUNTER — Telehealth: Payer: Self-pay

## 2022-06-11 NOTE — Telephone Encounter (Signed)
Called [facility] and they state the lab tech did not include a CBC and they do not have enough blood to add this on

## 2022-06-11 NOTE — Telephone Encounter (Signed)
-----   Message from Lin Landsman, MD sent at 06/08/2022 11:26 AM EST ----- Caryl Pina  Can you please check with [facility] about the CBC result.  It should not take this long  Thanks RV

## 2022-06-11 NOTE — Telephone Encounter (Signed)
Called and left a message for call back. Sent mychart message sent mychart message to patient

## 2022-06-12 ENCOUNTER — Encounter: Payer: Self-pay | Admitting: Gastroenterology

## 2022-06-12 DIAGNOSIS — Z931 Gastrostomy status: Secondary | ICD-10-CM | POA: Diagnosis not present

## 2022-06-12 DIAGNOSIS — K275 Chronic or unspecified peptic ulcer, site unspecified, with perforation: Secondary | ICD-10-CM | POA: Diagnosis not present

## 2022-06-12 DIAGNOSIS — D62 Acute posthemorrhagic anemia: Secondary | ICD-10-CM | POA: Diagnosis not present

## 2022-06-12 DIAGNOSIS — E440 Moderate protein-calorie malnutrition: Secondary | ICD-10-CM | POA: Diagnosis not present

## 2022-06-29 ENCOUNTER — Ambulatory Visit (INDEPENDENT_AMBULATORY_CARE_PROVIDER_SITE_OTHER): Payer: BC Managed Care – PPO | Admitting: Family Medicine

## 2022-06-29 ENCOUNTER — Encounter: Payer: Self-pay | Admitting: Family Medicine

## 2022-06-29 VITALS — BP 146/80 | HR 70 | Temp 97.7°F | Ht 61.0 in | Wt 118.4 lb

## 2022-06-29 DIAGNOSIS — J209 Acute bronchitis, unspecified: Secondary | ICD-10-CM | POA: Insufficient documentation

## 2022-06-29 LAB — POC COVID19 BINAXNOW: SARS Coronavirus 2 Ag: NEGATIVE

## 2022-06-29 MED ORDER — ALBUTEROL SULFATE HFA 108 (90 BASE) MCG/ACT IN AERS: 2.0000 | INHALATION_SPRAY | RESPIRATORY_TRACT | 1 refills | Status: DC | PRN

## 2022-06-29 MED ORDER — PREDNISONE 10 MG PO TABS: ORAL_TABLET | ORAL | 0 refills | Status: DC

## 2022-06-29 MED ORDER — BENZONATATE 200 MG PO CAPS: 200.0000 mg | ORAL_CAPSULE | Freq: Three times a day (TID) | ORAL | 1 refills | Status: DC | PRN

## 2022-06-29 NOTE — Patient Instructions (Signed)
Drink fluids and rest  mucinex DM is good for cough and congestion  Nasal saline for congestion as needed  Tylenol for fever or pain or headache  Please alert [place] if symptoms worsen (if severe or short of breath please go to the ER)    Keep thinking about quitting smoking   I sent in some prednisone to take as directed  Also an albuterol inhaler to use as needed   Watch for wheezing or shortness of breath  Update if not starting to improve in a week or if worsening

## 2022-06-29 NOTE — Assessment & Plan Note (Signed)
Neg covid test today Suspect viral /in smoker  No fever and reassuring exam  Px prednisone 30 mg taper Also albuterol mdi for prn use Tessalon Disc symptom care-mucinex dm may help Fluids and rest  ER precautions noted

## 2022-06-29 NOTE — Progress Notes (Signed)
Subjective:    Patient ID: Maria [name], female    DOB: 1970/11/05, 51 y.o.   MRN: [id number]  HPI 51 yo pf of NP [facility] presents with uri symptoms   Wt Readings from Last 3 Encounters:  06/29/22 118 lb 6 oz (53.7 kg)  05/23/22 118 lb (53.5 kg)  05/04/22 115 lb (52.2 kg)   22.37 kg/m  She had virt visit on 11/28 and px flonase and tessalon for viral uri  That got better  This is new   Started 12/24  Not feeling good Very tired  No fever  Nasal congestion  Prod cough- phlegm , green in color   No wheezing  No sob   Has not done a covid test   Smoking status -about a pack per day  Not ready to quit  Results for orders placed or performed in visit on 06/29/22  POC COVID-19 BinaxNow  Result Value Ref Range   SARS Coronavirus 2 Ag Negative Negative      Review of Systems  Constitutional:  Positive for appetite change and fatigue. Negative for fever.  HENT:  Positive for congestion, postnasal drip, rhinorrhea, sinus pressure, sneezing and sore throat. Negative for ear pain.   Eyes:  Negative for pain and discharge.  Respiratory:  Positive for cough. Negative for shortness of breath, wheezing and stridor.   Cardiovascular:  Negative for chest pain.  Gastrointestinal:  Negative for diarrhea, nausea and vomiting.  Genitourinary:  Negative for frequency, hematuria and urgency.  Musculoskeletal:  Negative for arthralgias and myalgias.  Skin:  Negative for rash.  Neurological:  Positive for headaches. Negative for dizziness, weakness and light-headedness.  Psychiatric/Behavioral:  Negative for confusion and dysphoric mood.        Objective:   Physical Exam Constitutional:      General: She is not in acute distress.    Appearance: Normal appearance. She is well-developed and normal weight. She is not ill-appearing, toxic-appearing or diaphoretic.  HENT:     Head: Normocephalic and atraumatic.     Comments: Nares are injected and congested      Right Ear:  Tympanic membrane, ear canal and external ear normal.     Left Ear: Tympanic membrane, ear canal and external ear normal.     Nose: Congestion and rhinorrhea present.     Mouth/Throat:     Mouth: Mucous membranes are moist.     Pharynx: Oropharynx is clear. No oropharyngeal exudate or posterior oropharyngeal erythema.     Comments: Clear pnd  Eyes:     General:        Right eye: No discharge.        Left eye: No discharge.     Conjunctiva/sclera: Conjunctivae normal.     Pupils: Pupils are equal, round, and reactive to light.  Cardiovascular:     Rate and Rhythm: Normal rate.     Heart sounds: Normal heart sounds.  Pulmonary:     Effort: Pulmonary effort is normal. No respiratory distress.     Breath sounds: No stridor. Wheezing and rhonchi present. No rales.     Comments: Diffusely distant bs  Few scattered rhonchi  Wheeze only on forced exp Chest:     Chest wall: No tenderness.  Musculoskeletal:     Cervical back: Normal range of motion and neck supple.  Lymphadenopathy:     Cervical: No cervical adenopathy.  Skin:    General: Skin is warm and dry.     Capillary Refill: Capillary refill  takes less than 2 seconds.     Findings: No rash.  Neurological:     Mental Status: She is alert.     Cranial Nerves: No cranial nerve deficit.  Psychiatric:        Mood and Affect: Mood normal.           Assessment & Plan:   Problem List Items Addressed This Visit       Respiratory   Acute bronchitis - Primary    Neg covid test today Suspect viral /in smoker  No fever and reassuring exam  Px prednisone 30 mg taper Also albuterol mdi for prn use Tessalon Disc symptom care-mucinex dm may help Fluids and rest  ER precautions noted       Relevant Orders   POC COVID-19 BinaxNow (Completed)

## 2022-07-05 ENCOUNTER — Telehealth: Payer: Self-pay | Admitting: Primary Care

## 2022-07-05 MED ORDER — AZITHROMYCIN 250 MG PO TABS: ORAL_TABLET | ORAL | 0 refills | Status: DC

## 2022-07-05 NOTE — Telephone Encounter (Signed)
Left VM requesting pt to call the office back 

## 2022-07-05 NOTE — Telephone Encounter (Signed)
This is reasonable for smoker with acute bronchitis and increasing phlegm  Sent to her pharmacy  Please give ER precautions Update if not starting to improve in a week or if worsening

## 2022-07-05 NOTE — Telephone Encounter (Signed)
Patient called in stating that she has been on prednisone for a week  for cough and sinus congestion. She called in today to ask can a zpak be sent in for her,because the congestion is not breaking up,and she still have a lot of green mucus coming up?

## 2022-07-05 NOTE — Telephone Encounter (Signed)
Sending to treating MD.

## 2022-07-10 NOTE — Telephone Encounter (Signed)
Pt did pick up Rx but never returned my call

## 2022-07-19 ENCOUNTER — Encounter: Payer: Self-pay | Admitting: Nurse Practitioner

## 2022-07-19 ENCOUNTER — Ambulatory Visit: Payer: BC Managed Care – PPO | Admitting: Nurse Practitioner

## 2022-07-19 VITALS — BP 122/68 | HR 72 | Ht 61.0 in | Wt 120.0 lb

## 2022-07-19 DIAGNOSIS — M546 Pain in thoracic spine: Secondary | ICD-10-CM

## 2022-07-19 NOTE — Progress Notes (Signed)
   Acute Office Visit  Subjective:     Patient ID: Maria [name], female    DOB: [birth year], 52 y.o.   MRN: [id number]  Chief Complaint  Patient presents with   Shoulder Pain    Ongoing, would like to try PT     Patient is in today for  left shoulder pain   States that it has been going on for some time approx 6 months. States that she ahs done steroid injections that has helped it. States that she was seen and discussed PT and doing exercises at home. States that the exercises helped some. States that as of late it is waking her up at night with a sharp pain. States that certain  movements make it worse. Fold sheets and towels that effects it. States that it is effecting. States that she has done ibuprofen. Has used lidocaine rub that helps but is short lived   Review of Systems  Constitutional:  Negative for chills and fever.  Musculoskeletal:  Positive for joint pain.  Neurological:  Negative for tingling and weakness.        Objective:    BP 122/68   Pulse 72   Ht '5\' 1"'$  (1.549 m)   Wt 120 lb (54.4 kg)   LMP 02/24/2018 (Approximate) Comment: spotting   SpO2 98%   BMI 22.67 kg/m    Physical Exam Vitals and nursing note reviewed.  Constitutional:      Appearance: Normal appearance.  Cardiovascular:     Rate and Rhythm: Normal rate and regular rhythm.     Pulses:          Radial pulses are 2+ on the right side and 2+ on the left side.     Heart sounds: Normal heart sounds.  Pulmonary:     Effort: Pulmonary effort is normal.     Breath sounds: Normal breath sounds.  Musculoskeletal:       Arms:     Comments: Full ROM Negative empty can test Tightness with Hawkins-kennedy test.    Neurological:     Mental Status: She is alert.     Deep Tendon Reflexes:     Reflex Scores:      Bicep reflexes are 2+ on the right side and 2+ on the left side.    Comments: Bilateral upper extremity strength 5/5     No results found for any visits on 07/19/22.       Assessment & Plan:   Problem List Items Addressed This Visit       Other   Back pain - Primary    Patient has tried conservative treatment such as over-the-counter oral analgesics, topical analgesics, at home exercises and stretching.  Patient has also had an injection per her report for sports medicine.  Continue using over-the-counter analgesics as needed topical analgesics also.  Ambulatory referral to physical therapy.  Patient does use muscle relaxant with some relief continue using that as needed.  Sedation precautions reviewed      Relevant Orders   Ambulatory referral to Physical Therapy    No orders of the defined types were placed in this encounter.   Return if symptoms worsen or fail to improve.  Romilda Garret, NP

## 2022-07-19 NOTE — Assessment & Plan Note (Signed)
Patient has tried conservative treatment such as over-the-counter oral analgesics, topical analgesics, at home exercises and stretching.  Patient has also had an injection per her report for sports medicine.  Continue using over-the-counter analgesics as needed topical analgesics also.  Ambulatory referral to physical therapy.  Patient does use muscle relaxant with some relief continue using that as needed.  Sedation precautions reviewed

## 2022-07-19 NOTE — Patient Instructions (Signed)
Nice to see you today I have placed the referral for physical therapy. They should contact you within 2 weeks Use oral NSAIDs sparingly. Tylenol is ok to use Topical treatments like lidocaine and voltaren gel are safe to use

## 2022-07-21 ENCOUNTER — Other Ambulatory Visit: Payer: Self-pay | Admitting: Family Medicine

## 2022-07-21 DIAGNOSIS — M546 Pain in thoracic spine: Secondary | ICD-10-CM

## 2022-07-26 ENCOUNTER — Telehealth: Payer: Self-pay

## 2022-07-26 NOTE — Telephone Encounter (Signed)
Patient was contacted by Baker Janus at [facility] for arrival time for tomorrow's procedure.  She informed Baker Janus that she called last week and rescheduled.   Chart reviewed and no telephone encounter is noted of her phone call to reschedule. Baker Janus informed her that she may receive a cancellation charge. Dr. Verlin Grills CMA has been made aware of this and message will be routed to her as well.  Thanks, Sharon, [place]

## 2022-07-27 ENCOUNTER — Ambulatory Visit
Admission: RE | Admit: 2022-07-27 | Payer: BC Managed Care – PPO | Source: Home / Self Care | Admitting: Gastroenterology

## 2022-07-27 ENCOUNTER — Encounter: Admission: RE | Payer: Self-pay | Source: Home / Self Care

## 2022-07-27 SURGERY — COLONOSCOPY WITH PROPOFOL
Anesthesia: General

## 2022-08-22 ENCOUNTER — Other Ambulatory Visit: Payer: Self-pay | Admitting: Family Medicine

## 2022-10-01 ENCOUNTER — Other Ambulatory Visit: Payer: Self-pay | Admitting: Family Medicine

## 2022-10-01 DIAGNOSIS — M546 Pain in thoracic spine: Secondary | ICD-10-CM

## 2022-10-02 NOTE — Telephone Encounter (Signed)
Last office visit 07/19/2022 with Cable for left sided thoracic back pain.  Last refilled 07/22/2022 for #30 with 3 refills by Dr. Lorelei Pont.  Next Appt: 10/03/2022 with PCP for pain and numbness in hands and right foot.

## 2022-10-03 ENCOUNTER — Ambulatory Visit (INDEPENDENT_AMBULATORY_CARE_PROVIDER_SITE_OTHER)
Admission: RE | Admit: 2022-10-03 | Discharge: 2022-10-03 | Disposition: A | Discharge status: Home / Self Care | Payer: BC Managed Care – PPO | Source: Ambulatory Visit | Attending: Primary Care | Admitting: Primary Care

## 2022-10-03 ENCOUNTER — Encounter: Payer: Self-pay | Admitting: Primary Care

## 2022-10-03 ENCOUNTER — Ambulatory Visit: Payer: BC Managed Care – PPO | Admitting: Primary Care

## 2022-10-03 VITALS — BP 136/68 | HR 65 | Temp 98.3°F | Ht 61.0 in | Wt 120.0 lb

## 2022-10-03 DIAGNOSIS — G5603 Carpal tunnel syndrome, bilateral upper limbs: Secondary | ICD-10-CM

## 2022-10-03 DIAGNOSIS — M79671 Pain in right foot: Secondary | ICD-10-CM

## 2022-10-03 DIAGNOSIS — M79673 Pain in unspecified foot: Secondary | ICD-10-CM | POA: Insufficient documentation

## 2022-10-03 NOTE — Assessment & Plan Note (Signed)
No obvious deformity, less likely gout. Likely arthritis.   Xrays of right foot ordered and pending.

## 2022-10-03 NOTE — Assessment & Plan Note (Signed)
Representative of symptoms and exam today.  Discussed to wear brace at night as she cannot wear during the day. Referral placed to orthopedics.

## 2022-10-03 NOTE — Patient Instructions (Signed)
Complete xray(s) prior to leaving today. I will notify you of your results once received.  You will either be contacted via phone regarding your referral to orthopedics, or you may receive a letter on your MyChart portal from our referral team with instructions for scheduling an appointment. Please let us know if you have not been contacted by anyone within two weeks.  It was a pleasure to see you today!

## 2022-10-03 NOTE — Progress Notes (Signed)
Subjective:    Patient ID: Maria [name], female    DOB: 1971-01-18, 52 y.o.   MRN: [id number]  Foot Pain Associated symptoms include arthralgias and numbness. Pertinent negatives include no joint swelling.    Maria [name] is a very pleasant 52 y.o. female with a histoory of chronic pancreatitis, PUD, alcohol use disorder, upper GI bleed, who presents today to discuss several concerns.  1) Foot Pain: Acute for the last several weeks and located to the dorsal and plantar 4th and 5th toes and metatarsal joint. She did break those two toes about 20 years ago.   Over the last few weeks she notices pain around mid day. She works walking on concrete floors for her work day. She's tried changing shoes without improvement. She denies falls, injury, recent trauma, history of gout.  2) Paresthesias: Chronic for the last 1+ year, located to bilateral wrists with radiation down through fingers on both hands and up to her mid forearms. Her right wrist and forearm is more bothersome than her left. Evaluated by orthopedics last year, diagnosed with carpal tunnel syndrome, underwent injection to the right wrist which helped. Overall symptoms were not as bothersome then.   She has braces at home, does not wear them. She was referred to neurology in November of 2023 for paresthesias but did not attend that appointment.   Review of Systems  Musculoskeletal:  Positive for arthralgias. Negative for joint swelling.  Skin:  Positive for color change.  Neurological:  Positive for numbness.         Past Medical History:  Diagnosis Date   Acute on chronic blood loss anemia 11/02/2021   Acute postoperative anemia due to expected blood loss 09/19/2021   Anemia    Anesthesia complication, initial encounter 09/13/2021   Difficulty waking up from Anesthesia requiring re-intubation   Ankle edema, bilateral 11/15/2021   Anxiety    h/o   Depression    h/o   Family history of adverse reaction to  anesthesia    mom-hard time waking up   Folate deficiency 07/30/2019   Gastritis 08/19/2020   GERD (gastroesophageal reflux disease)    tums prn   Hypertension    WAS PUT ON BP MED BY PCP LAST YEAR (2018) AND BP MED MADE PT SICK SO SHE STOPPED TAKING IT-PCP MONITORS BP NOW   Palpitations 05/18/2020   Pancreatitis    Peptic ulcer    Perforated peptic ulcer 09/14/2021   Poison ivy dermatitis 11/13/2019   Upper GI bleed 04/11/2022   Vitamin B12 deficiency 01/17/2018   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    Social History   Socioeconomic History   Marital status: Married    Spouse name: Not on file   Number of children: Not on file   Years of education: Not on file   Highest education level: Not on file  Occupational History   Not on file  Tobacco Use   Smoking status: Every Day    Packs/day: 1.00    Years: 20.00    Additional pack years: 0.00    Total pack years: 20.00    Types: Cigarettes   Smokeless tobacco: Never  Vaping Use   Vaping Use: Never used  Substance and Sexual Activity   Alcohol use: Yes    Alcohol/week: 2.0 standard drinks of alcohol    Types: 2 Glasses of wine per week   Drug use: No   Sexual activity: Yes  Other Topics Concern   Not on file  Social History Narrative   Married.   1 child.    Working at [employer] at [employer].     Enjoys swimming, camping   Social Determinants of Health   Financial Resource Strain: Not on file  Food Insecurity: No Food Insecurity (04/12/2022)   Hunger Vital Sign    Worried About Running Out of Food in the Last Year: Never true    Ran Out of Food in the Last Year: Never true  Transportation Needs: No Transportation Needs (04/12/2022)   PRAPARE - Hydrologist (Medical): No    Lack of Transportation (Non-Medical): No  Physical Activity: Not on file  Stress: Not on file  Social Connections: Not on file  Intimate Partner Violence: Not At Risk (04/12/2022)   Humiliation, Afraid, Rape, and Kick  questionnaire    Fear of Current or Ex-Partner: No    Emotionally Abused: No    Physically Abused: No    Sexually Abused: No    Past Surgical History:  Procedure Laterality Date   ANTERIOR AND POSTERIOR REPAIR WITH SACROSPINOUS FIXATION N/A 03/13/2018   Procedure: ANTERIOR REPAIR;  Surgeon: [name], MD;  Location: [facility];  Service: Gynecology;  Laterality: N/A;   ESOPHAGOGASTRODUODENOSCOPY (EGD) WITH PROPOFOL N/A 03/27/2022   Procedure: ESOPHAGOGASTRODUODENOSCOPY (EGD) WITH PROPOFOL;  Surgeon: Lesly Rubenstein, MD;  Location: [facility];  Service: Endoscopy;  Laterality: N/A;   ESOPHAGOGASTRODUODENOSCOPY (EGD) WITH PROPOFOL N/A 04/12/2022   Procedure: ESOPHAGOGASTRODUODENOSCOPY (EGD) WITH PROPOFOL;  Surgeon: Lin Landsman, MD;  Location: [facility];  Service: Gastroenterology;  Laterality: N/A;   INDUCED ABORTION     x2   IR CM INJ ANY COLONIC TUBE W/FLUORO  09/29/2021   LAPAROTOMY N/A 09/13/2021   Procedure: EXPLORATORY LAPAROTOMY-Graham PATCH repair;  Surgeon: [name], DO;  Location: [facility];  Service: General;  Laterality: N/A;   LAPAROTOMY N/A 09/17/2021   Procedure: EXPLORATORY LAPAROTOMY;  Surgeon: [name], MD;  Location: [facility];  Service: General;  Laterality: N/A;   REPAIR OF PERFORATED ULCER N/A 09/17/2021   Procedure: REPAIR OF PERFORATED ULCER;  Surgeon: [name], MD;  Location: [facility];  Service: General;  Laterality: N/A;   VAGINAL HYSTERECTOMY N/A 03/13/2018   Procedure: HYSTERECTOMY VAGINAL;  Surgeon: [name], MD;  Location: [facility];  Service: Gynecology;  Laterality: N/A;    Family History  Problem Relation Age of Onset   Arthritis Mother    Arthritis Father    Alcohol abuse Father    Hypertension Father    Heart disease Father    Aneurysm Father    Breast cancer Maternal Grandmother    Breast cancer Paternal Grandmother     Allergies  Allergen Reactions   Anesthesia S-I-40 [Propofol]    Bee Venom Swelling    Bupropion Anxiety and Other (See Comments)    Worsened anxiety    Current Outpatient Medications on File Prior to Visit  Medication Sig Dispense Refill   albuterol (VENTOLIN HFA) 108 (90 Base) MCG/ACT inhaler Inhale 2 puffs into the lungs every 4 (four) hours as needed for wheezing. 1 each 1   gabapentin (NEURONTIN) 100 MG capsule Take 1-2 capsules by mouth at bedtime for alcohol dependence. 180 capsule 1   cyclobenzaprine (FLEXERIL) 5 MG tablet TAKE 1 TABLET BY MOUTH 2 TIMES DAILY AS NEEDED FOR MUSCLE SPASMS. (Patient not taking: Reported on 10/03/2022) 30 tablet 3   fluticasone (FLONASE) 50 MCG/ACT nasal spray Place 2 sprays into both nostrils daily. (Patient not taking:  Reported on 10/03/2022) 16 g 0   omeprazole (PRILOSEC) 40 MG capsule Take 1 capsule (40 mg total) by mouth 2 (two) times daily. 60 capsule 2   No current facility-administered medications on file prior to visit.    BP 136/68   Pulse 65   Temp 98.3 F (36.8 C) (Temporal)   Ht 5\' 1"  (1.549 m)   Wt 120 lb (54.4 kg)   LMP 02/24/2018 (Approximate) Comment: spotting   SpO2 97%   BMI 22.67 kg/m  Objective:   Physical Exam Constitutional:      General: She is not in acute distress. Cardiovascular:     Rate and Rhythm: Normal rate.  Pulmonary:     Effort: Pulmonary effort is normal.  Musculoskeletal:     Right foot: Normal range of motion. Tenderness present. No swelling or deformity.       Feet:  Skin:    General: Skin is warm and dry.  Neurological:     Comments: Positive Tinel's sign bilaterally, negative Phalen's sign bilaterally.            Assessment & Plan:  Acute pain of right foot Assessment & Plan: No obvious deformity, less likely gout. Likely arthritis.   Xrays of right foot ordered and pending.    Orders: -     DG Foot Complete Right  Bilateral carpal tunnel syndrome Assessment & Plan: Representative of symptoms and exam today.  Discussed to wear brace at night as she cannot wear  during the day. Referral placed to orthopedics.     Orders: -     Ambulatory referral to [place], NP

## 2022-10-08 ENCOUNTER — Ambulatory Visit: Payer: BC Managed Care – PPO | Admitting: Family Medicine

## 2022-10-08 VITALS — BP 130/60 | HR 83 | Temp 98.0°F | Ht 61.0 in | Wt 123.2 lb

## 2022-10-08 DIAGNOSIS — G5601 Carpal tunnel syndrome, right upper limb: Secondary | ICD-10-CM

## 2022-10-08 NOTE — Progress Notes (Unsigned)
Maria [name] T. Maria Szabo, MD, CAQ Sports Medicine [facility] [address] [place], [address]  Phone: [phone]  FAX: [phone]  Maria [name] - 52 y.o. female  MRN [id number]  Date of Birth: December 22, 1970  Date: 10/08/2022  PCP: Doreene Nest, NP  Referral: Doreene Nest, NP  Chief Complaint  Patient presents with   Hand Numbness    Right   Subjective:   Maria [name] is a 52 y.o. very pleasant female patient with Body mass index is 23.29 kg/m. who presents with the following:  05/2022 - saw her in the office with some R CTS.   R hand is killing her and going numb, keeping her from sleeping.  CTS injection lasted a couple of months. I did do a carpal tunnel injection in 2022 that provided her with an extended period of relief of symptoms.   She has having burning and tingling throughout the right hand in a median nerve distribution.  She gets pain in tingling from the third digit all the way to the thumb.  She has pain and tingling on the volar aspect of this part of the hand, as well.  She has already had 2 different carpal tunnel injections, 1 lasted an extended period of time, and the last 1 lasted roughly 2 months.  She does use splints at nighttime, but at this point the tingling and pain has become so aggravating that she is not really able to wear her splint.  Median nerve distribution.   05/23/2022: Last OV with Maria Beat, MD  She presents with numbness in her right hand.  This is been present for an extended period of time on the order years.  I did do a carpal tunnel injection on her roughly 18 months ago, and I did provide some relief of symptoms for an extended period of time, but at this point it is returned and she is symptomatic essentially all the time.  Even when she rest, this does not get better.  She does wear some carpal tunnel splints, they do not seem to help at all.  Numbness and tingling is also  worsened.   Numbness and gotten worse in the last few weeks.    Review of Systems is noted in the HPI, as appropriate  Objective:   BP 130/60   Pulse 83   Temp 98 F (36.7 C) (Temporal)   Ht 5\' 1"  (1.549 m)   Wt 123 lb 4 oz (55.9 kg)   LMP 02/24/2018 (Approximate) Comment: spotting   SpO2 97%   BMI 23.29 kg/m   GEN: No acute distress; alert,appropriate. PULM: Breathing comfortably in no respiratory distress PSYCH: Normally interactive.   R hand Ecchymosis or edema: neg ROM wrist/hand/digits: full  Carpals, MCP's, digits: NT Distal Ulna and Radius: NT Ecchymosis or edema: neg No instability Cysts/nodules: neg Digit triggering: neg Finkelstein's test: neg Snuffbox tenderness: neg Scaphoid tubercle: NT Resisted supination: NT Full composite fist, no malrotation Grip, all digits: 5/5 str DIPJT: NT PIP JT: NT MCP JT: NT No tenosynovitis Axial load test: neg Phalen's: pos Tinel's: pos Atrophy: neg  Hand sensation: intact   Laboratory and Imaging Data:  Assessment and Plan:     ICD-10-CM   1. Right carpal tunnel syndrome  G56.01 Ambulatory referral to Neurology    CANCELED: Ambulatory referral to Neurology     Very symptomatic right-sided carpal tunnel syndrome.  We will obtain EMGs and nerve conduction velocities, and  assuming this is abnormal, consult hand surgery.  Medication Management during today's office visit: No orders of the defined types were placed in this encounter.  Medications Discontinued During This Encounter  Medication Reason   fluticasone (FLONASE) 50 MCG/ACT nasal spray Completed Course   albuterol (VENTOLIN HFA) 108 (90 Base) MCG/ACT inhaler Completed Course    Orders placed today for conditions managed today: Orders Placed This Encounter  Procedures   Ambulatory referral to Neurology    Disposition: No follow-ups on file.  Dragon [employer] One speech-to-text software was used for transcription in this dictation.  Possible  transcriptional errors can occur using Animal nutritionist.   Signed,  Maria [name]. Makenleigh Crownover, MD   Outpatient Encounter Medications as of 10/08/2022  Medication Sig   cyclobenzaprine (FLEXERIL) 5 MG tablet TAKE 1 TABLET BY MOUTH 2 TIMES DAILY AS NEEDED FOR MUSCLE SPASMS.   gabapentin (NEURONTIN) 100 MG capsule Take 1-2 capsules by mouth at bedtime for alcohol dependence.   omeprazole (PRILOSEC) 40 MG capsule Take 1 capsule (40 mg total) by mouth 2 (two) times daily.   [DISCONTINUED] albuterol (VENTOLIN HFA) 108 (90 Base) MCG/ACT inhaler Inhale 2 puffs into the lungs every 4 (four) hours as needed for wheezing.   [DISCONTINUED] fluticasone (FLONASE) 50 MCG/ACT nasal spray Place 2 sprays into both nostrils daily. (Patient not taking: Reported on 10/03/2022)   No facility-administered encounter medications on file as of 10/08/2022.

## 2022-10-11 ENCOUNTER — Encounter: Payer: Self-pay | Admitting: *Deleted

## 2022-10-16 DIAGNOSIS — G5601 Carpal tunnel syndrome, right upper limb: Secondary | ICD-10-CM | POA: Diagnosis not present

## 2022-10-16 DIAGNOSIS — M65331 Trigger finger, right middle finger: Secondary | ICD-10-CM | POA: Diagnosis not present

## 2022-10-29 ENCOUNTER — Encounter: Payer: Self-pay | Admitting: Family Medicine

## 2022-10-29 ENCOUNTER — Ambulatory Visit: Payer: BC Managed Care – PPO | Admitting: Family Medicine

## 2022-10-29 VITALS — BP 140/60 | HR 71 | Temp 97.7°F | Ht 61.0 in | Wt 122.0 lb

## 2022-10-29 DIAGNOSIS — M79673 Pain in unspecified foot: Secondary | ICD-10-CM | POA: Diagnosis not present

## 2022-10-29 NOTE — Patient Instructions (Signed)
I would wear a hard soled post op shoe when weight bearing for 2 weeks.    If no pain, then gradually wean out of the post op shoe.   If still with any pain, then would extend for another 2 weeks.    Update [place] as needed if not pain free.  Take care.  Glad to see you.

## 2022-10-29 NOTE — Progress Notes (Unsigned)
R foot injury years ago.  Prev xray with remote fractures about the second and third metatarsal diaphyses. No acute changes.  Cramping at night in R leg, unclear if from gait changes from R foot pain.  Foot pain worse as the day goes on.  She tried different shoes, etc.  Dorsal distal R 3rd MT area is painful.  No plantar pain.  Hasn't used a post op shoe recently.  Vit D wnl in 06/2022.    Meds, vitals, and allergies reviewed.   ROS: Per HPI unless specifically indicated in ROS section   nad R foot with normal inspection, not bruised or puffy.   Dorsal distal R 3rd MT area is painful, tender locally.  Med and lat mal not ttp. Midfoot not ttp o/w Skin with normal perfusion.   She clearly Felt better in post op shoe.    Prev xray d/w pt.  Prev fx seen.

## 2022-10-30 NOTE — Assessment & Plan Note (Signed)
I would wear a hard soled post op shoe when weight bearing for 2 weeks.    If no pain, then gradually wean out of the post op shoe.   If still with any pain, then would extend for another 2 weeks.    Update [place] as needed if not pain free at that point.  She agrees with plan.    She can buy OTC post op shoe- that may be cheaper for patient.  She agrees with plan.

## 2022-10-31 ENCOUNTER — Other Ambulatory Visit: Payer: Self-pay | Admitting: Family Medicine

## 2022-10-31 MED ORDER — EPINEPHRINE 0.3 MG/0.3ML IJ SOAJ: 0.3000 mg | INTRAMUSCULAR | 0 refills | Status: AC | PRN

## 2022-11-22 ENCOUNTER — Encounter: Payer: Self-pay | Admitting: Family Medicine

## 2022-11-23 ENCOUNTER — Encounter: Payer: Self-pay | Admitting: Primary Care

## 2022-11-23 ENCOUNTER — Ambulatory Visit: Payer: BC Managed Care – PPO | Admitting: Primary Care

## 2022-11-23 VITALS — BP 150/82 | HR 79 | Temp 98.2°F | Ht 61.0 in | Wt 122.0 lb

## 2022-11-23 DIAGNOSIS — L237 Allergic contact dermatitis due to plants, except food: Secondary | ICD-10-CM

## 2022-11-23 MED ORDER — TRIAMCINOLONE ACETONIDE 0.5 % EX CREA: 1.0000 | TOPICAL_CREAM | Freq: Two times a day (BID) | CUTANEOUS | 0 refills | Status: DC | PRN

## 2022-11-23 MED ORDER — METHYLPREDNISOLONE ACETATE 80 MG/ML IJ SUSP
80.0000 mg | Freq: Once | INTRAMUSCULAR | Status: AC
Start: 2022-11-23 — End: 2022-11-23
  Administered 2022-11-23: 80 mg via INTRAMUSCULAR

## 2022-11-23 MED ORDER — PREDNISONE 20 MG PO TABS: ORAL_TABLET | ORAL | 0 refills | Status: DC

## 2022-11-23 NOTE — Patient Instructions (Signed)
Start prednisone tablets. Take two tablets my mouth once daily in the morning for four days, then one tablet once daily in the morning for four days.   Use the triamcinolone cream as needed.  It was a pleasure to see you today!

## 2022-11-23 NOTE — Progress Notes (Signed)
Subjective:    Patient ID: Maria [name], female    DOB: October 11, 1970, 52 y.o.   MRN: [id number]  Poison Ivy Pertinent negatives include no shortness of breath.    Maria [name] is a very pleasant 52 y.o. female with history of chronic pancreatitis, peptic ulcer disease, peripheral neuropathy, AKI, thrombocytopenia, bee sting allergy who presents today to discuss rash.  Symptom onset two days ago with a pruritic rash to the bilateral anterior forearms and (anatomical position).  She was pulling weeds a few days ago before symptom onset, believes she saw poison ivy.   She's taken Benadryl and applying Cortisone cream without much improvement.   She has since washed all linens, bed sheets, clothing that she went to.  She is wrapping her arms with gauze and Coban to prevent irritation.  She denies wheezing, shortness of breath, chest tightness.   Review of Systems  Respiratory:  Negative for shortness of breath and wheezing.   Skin:  Positive for rash.         Past Medical History:  Diagnosis Date   Acute on chronic blood loss anemia 11/02/2021   Acute postoperative anemia due to expected blood loss 09/19/2021   Anemia    Anesthesia complication, initial encounter 09/13/2021   Difficulty waking up from Anesthesia requiring re-intubation   Ankle edema, bilateral 11/15/2021   Anxiety    h/o   Depression    h/o   Family history of adverse reaction to anesthesia    mom-hard time waking up   Folate deficiency 07/30/2019   Gastritis 08/19/2020   GERD (gastroesophageal reflux disease)    tums prn   Hypertension    WAS PUT ON BP MED BY PCP LAST YEAR (2018) AND BP MED MADE PT SICK SO SHE STOPPED TAKING IT-PCP MONITORS BP NOW   Palpitations 05/18/2020   Pancreatitis    Peptic ulcer    Perforated peptic ulcer (HCC) 09/14/2021   Poison ivy dermatitis 11/13/2019   Upper GI bleed 04/11/2022   Vitamin B12 deficiency 01/17/2018   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    Social  History   Socioeconomic History   Marital status: Married    Spouse name: Not on file   Number of children: Not on file   Years of education: Not on file   Highest education level: Some college, no degree  Occupational History   Not on file  Tobacco Use   Smoking status: Every Day    Packs/day: 1.00    Years: 20.00    Additional pack years: 0.00    Total pack years: 20.00    Types: Cigarettes   Smokeless tobacco: Never  Vaping Use   Vaping Use: Never used  Substance and Sexual Activity   Alcohol use: Yes    Alcohol/week: 2.0 standard drinks of alcohol    Types: 2 Glasses of wine per week   Drug use: No   Sexual activity: Yes  Other Topics Concern   Not on file  Social History Narrative   Married.   1 child.    Working at [employer]	[place] at [employer].     Enjoys swimming, camping   Social Determinants of Health   Financial Resource Strain: Low Risk  (10/08/2022)   Overall Financial Resource Strain (CARDIA)    Difficulty of Paying Living Expenses: Not hard at all  Food Insecurity: No Food Insecurity (10/08/2022)   Hunger Vital Sign    Worried About Running Out of Food in the Last Year: Never true  Ran Out of Food in the Last Year: Never true  Transportation Needs: No Transportation Needs (10/08/2022)   PRAPARE - Administrator, Civil Service (Medical): No    Lack of Transportation (Non-Medical): No  Physical Activity: Sufficiently Active (10/08/2022)   Exercise Vital Sign    Days of Exercise per Week: 5 days    Minutes of Exercise per Session: 30 min  Stress: No Stress Concern Present (10/08/2022)   [employer] of Occupational Health - Occupational Stress Questionnaire    Feeling of Stress : Not at all  Social Connections: Moderately Isolated (10/08/2022)   Social Connection and Isolation Panel [NHANES]    Frequency of Communication with Friends and Family: More than three times a week    Frequency of Social Gatherings with Friends and Family: More than three times a week     Attends Religious Services: Never    Database administrator or Organizations: No    Attends Engineer, structural: Not on file    Marital Status: Married  Catering manager Violence: Not At Risk (04/12/2022)   Humiliation, Afraid, Rape, and Kick questionnaire    Fear of Current or Ex-Partner: No    Emotionally Abused: No    Physically Abused: No    Sexually Abused: No    Past Surgical History:  Procedure Laterality Date   ANTERIOR AND POSTERIOR REPAIR WITH SACROSPINOUS FIXATION N/A 03/13/2018   Procedure: ANTERIOR REPAIR;  Surgeon: Nadara Mustard, MD;  Location: [facility];  Service: Gynecology;  Laterality: N/A;   ESOPHAGOGASTRODUODENOSCOPY (EGD) WITH PROPOFOL N/A 03/27/2022   Procedure: ESOPHAGOGASTRODUODENOSCOPY (EGD) WITH PROPOFOL;  Surgeon: Regis Bill, MD;  Location: [facility];  Service: Endoscopy;  Laterality: N/A;   ESOPHAGOGASTRODUODENOSCOPY (EGD) WITH PROPOFOL N/A 04/12/2022   Procedure: ESOPHAGOGASTRODUODENOSCOPY (EGD) WITH PROPOFOL;  Surgeon: Toney Reil, MD;  Location: [facility] ENDOSCOPY;  Service: Gastroenterology;  Laterality: N/A;   INDUCED ABORTION     x2   IR CM INJ ANY COLONIC TUBE W/FLUORO  09/29/2021   LAPAROTOMY N/A 09/13/2021   Procedure: EXPLORATORY LAPAROTOMY-Graham PATCH repair;  Surgeon: Sung Amabile, DO;  Location: [facility];  Service: General;  Laterality: N/A;   LAPAROTOMY N/A 09/17/2021   Procedure: EXPLORATORY LAPAROTOMY;  Surgeon: Henrene Dodge, MD;  Location: [facility];  Service: General;  Laterality: N/A;   REPAIR OF PERFORATED ULCER N/A 09/17/2021   Procedure: REPAIR OF PERFORATED ULCER;  Surgeon: Henrene Dodge, MD;  Location: [facility];  Service: General;  Laterality: N/A;   VAGINAL HYSTERECTOMY N/A 03/13/2018   Procedure: HYSTERECTOMY VAGINAL;  Surgeon: Nadara Mustard, MD;  Location: [facility];  Service: Gynecology;  Laterality: N/A;    Family History  Problem Relation Age of Onset   Arthritis Mother    Arthritis Father     Alcohol abuse Father    Hypertension Father    Heart disease Father    Aneurysm Father    Breast cancer Maternal Grandmother    Breast cancer Paternal Grandmother     Allergies  Allergen Reactions   Anesthesia S-I-40 [Propofol]     Lethargic after med use   Bee Venom Swelling   Bupropion Anxiety and Other (See Comments)    Worsened anxiety    Current Outpatient Medications on File Prior to Visit  Medication Sig Dispense Refill   cyclobenzaprine (FLEXERIL) 5 MG tablet TAKE 1 TABLET BY MOUTH 2 TIMES DAILY AS NEEDED FOR MUSCLE SPASMS. 30 tablet 3   EPINEPHrine (EPIPEN 2-PAK) 0.3 mg/0.3 mL IJ SOAJ  injection Inject 0.3 mg into the muscle as needed for anaphylaxis. 2 each 0   gabapentin (NEURONTIN) 100 MG capsule Take 1-2 capsules by mouth at bedtime for alcohol dependence. 180 capsule 1   omeprazole (PRILOSEC) 40 MG capsule Take 1 capsule (40 mg total) by mouth 2 (two) times daily. 60 capsule 2   No current facility-administered medications on file prior to visit.    BP (!) 150/82   Pulse 79   Temp 98.2 F (36.8 C) (Temporal)   Ht 5\' 1"  (1.549 m)   Wt 122 lb (55.3 kg)   LMP 02/24/2018 (Approximate) Comment: spotting   SpO2 98%   BMI 23.05 kg/m  Objective:   Physical Exam Constitutional:      General: She is not in acute distress. Pulmonary:     Effort: Pulmonary effort is normal.  Skin:    General: Skin is warm and dry.     Findings: Rash present.     Comments: Erythematous raised rash to bilateral anterior forearms, anatomical position.  Neurological:     Mental Status: She is alert.           Assessment & Plan:  Poison ivy dermatitis Assessment & Plan: Exam and HPI today consistent for poison ivy dermatitis.  Discussed to discontinue Benadryl.  Start prednisone tablets. Take two tablets my mouth once daily in the morning for four days, then one tablet once daily in the morning for four days.  Rx for triamcinolone 0.5% cream sent to pharmacy to use as  needed.  IM Depo medrol 80 mg provided today.  Follow-up as needed.  Orders: -     predniSONE; Take two tablets my mouth once daily in the morning for four days, then one tablet once daily in the morning for four days.  Dispense: 12 tablet; Refill: 0 -     Triamcinolone Acetonide; Apply 1 Application topically 2 (two) times daily as needed.  Dispense: 30 g; Refill: 0        Doreene Nest, NP

## 2022-11-23 NOTE — Addendum Note (Signed)
Addended by: Lonia Blood on: 11/23/2022 12:26 PM   Modules accepted: Orders

## 2022-11-23 NOTE — Assessment & Plan Note (Signed)
Exam and HPI today consistent for poison ivy dermatitis.  Discussed to discontinue Benadryl.  Start prednisone tablets. Take two tablets my mouth once daily in the morning for four days, then one tablet once daily in the morning for four days.  Rx for triamcinolone 0.5% cream sent to pharmacy to use as needed.  IM Depo medrol 80 mg provided today.  Follow-up as needed.

## 2023-01-10 ENCOUNTER — Encounter: Payer: Self-pay | Admitting: Primary Care

## 2023-01-10 ENCOUNTER — Ambulatory Visit: Payer: BC Managed Care – PPO | Admitting: Primary Care

## 2023-01-10 VITALS — BP 126/72 | HR 82 | Temp 97.2°F | Ht 61.0 in | Wt 119.0 lb

## 2023-01-10 DIAGNOSIS — R252 Cramp and spasm: Secondary | ICD-10-CM | POA: Insufficient documentation

## 2023-01-10 DIAGNOSIS — F1090 Alcohol use, unspecified, uncomplicated: Secondary | ICD-10-CM | POA: Diagnosis not present

## 2023-01-10 DIAGNOSIS — D7589 Other specified diseases of blood and blood-forming organs: Secondary | ICD-10-CM

## 2023-01-10 LAB — POC URINALSYSI DIPSTICK (AUTOMATED)
Bilirubin, UA: NEGATIVE
Blood, UA: NEGATIVE
Glucose, UA: NEGATIVE
Ketones, UA: NEGATIVE
Leukocytes, UA: NEGATIVE
Nitrite, UA: NEGATIVE
Protein, UA: NEGATIVE
Spec Grav, UA: 1.01 (ref 1.010–1.025)
Urobilinogen, UA: 0.2 E.U./dL
pH, UA: 6 (ref 5.0–8.0)

## 2023-01-10 LAB — CBC
HCT: 40.5 % (ref 36.0–46.0)
Hemoglobin: 13.7 g/dL (ref 12.0–15.0)
MCHC: 33.9 g/dL (ref 30.0–36.0)
MCV: 110 fl — ABNORMAL HIGH (ref 78.0–100.0)
Platelets: 113 10*3/uL — ABNORMAL LOW (ref 150.0–400.0)
RBC: 3.68 Mil/uL — ABNORMAL LOW (ref 3.87–5.11)
RDW: 13.1 % (ref 11.5–15.5)
WBC: 7.3 10*3/uL (ref 4.0–10.5)

## 2023-01-10 LAB — BASIC METABOLIC PANEL
BUN: 16 mg/dL (ref 6–23)
CO2: 25 mEq/L (ref 19–32)
Calcium: 9.1 mg/dL (ref 8.4–10.5)
Chloride: 108 mEq/L (ref 96–112)
Creatinine, Ser: 0.72 mg/dL (ref 0.40–1.20)
GFR: 96.2 mL/min (ref >60.00)
Glucose, Bld: 92 mg/dL (ref 70–99)
Potassium: 3.5 mEq/L (ref 3.5–5.1)
Sodium: 139 mEq/L (ref 135–145)

## 2023-01-10 LAB — CK: Total CK: 177 U/L (ref 7–177)

## 2023-01-10 MED ORDER — GABAPENTIN 100 MG PO CAPS
200.0000 mg | ORAL_CAPSULE | Freq: Every day | ORAL | 0 refills | Status: DC
Start: 2023-01-10 — End: 2023-11-14

## 2023-01-10 NOTE — Progress Notes (Signed)
Subjective:    Patient ID: Maria [name], female    DOB: [birth year], 52 y.o.   MRN: [id number]  Leg Pain  Pertinent negatives include no numbness.    Maria [name] is a very pleasant 52 y.o. female with a history of chronic pancreatitis, peptic ulcer disease, peripheral neuropathy, carpal tunnel syndrome, thrombocytopenia, alcohol abuse who presents today to discuss lower extremity cramping.  She is also needing a refill of her gabapentin.  Symptoms began about a few weeks ago with nightly lower extremity cramping. Over the last 1 week her cramping has increased in intensity.   Her cramping occurs to her bilateral lower extremities from the thighs, hamstrings, and to the calves down to her toes. Her toes will curl and lock up.   Her cramping only occurs at night when trying to sleep.  She has to get out of bed to work and her cramps.  She denies lower extremity pain during the day, swelling in the calves, long car trips, changes in her diet or medication regimen, increased exercise.  She has not managed on statin therapy or diuretic.  She began taking OTC potassium a few days ago and hasn't noticed improvement.  She has resumed alcohol and is drinking 1-2 beers every 2 to 3 days.  She is drinking 24 ounces of water daily, also drinks Gatorade and Powerade sometimes. She does work on her feet for about 10 hour shifts, 4-5 days weekly.  She works in a [employer] which is approximately 100 degrees in temperature.   Review of Systems  Musculoskeletal:  Positive for myalgias.  Skin:  Negative for color change.  Neurological:  Negative for numbness.         Past Medical History:  Diagnosis Date   Acute on chronic blood loss anemia 11/02/2021   Acute postoperative anemia due to expected blood loss 09/19/2021   Anemia    Anesthesia complication, initial encounter 09/13/2021   Difficulty waking up from Anesthesia requiring re-intubation   Ankle edema, bilateral 11/15/2021    Anxiety    h/o   Depression    h/o   Family history of adverse reaction to anesthesia    mom-hard time waking up   Folate deficiency 07/30/2019   Gastritis 08/19/2020   GERD (gastroesophageal reflux disease)    tums prn   Hypertension    WAS PUT ON BP MED BY PCP LAST YEAR (2018) AND BP MED MADE PT SICK SO SHE STOPPED TAKING IT-PCP MONITORS BP NOW   Palpitations 05/18/2020   Pancreatitis    Peptic ulcer    Perforated peptic ulcer (HCC) 09/14/2021   Poison ivy dermatitis 11/13/2019   Upper GI bleed 04/11/2022   Vitamin B12 deficiency 01/17/2018   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    Social History   Socioeconomic History   Marital status: Married    Spouse name: Not on file   Number of children: Not on file   Years of education: Not on file   Highest education level: Some college, no degree  Occupational History   Not on file  Tobacco Use   Smoking status: Every Day    Current packs/day: 1.00    Average packs/day: 1 pack/day for 20.0 years (20.0 ttl pk-yrs)    Types: Cigarettes   Smokeless tobacco: Never  Vaping Use   Vaping status: Never Used  Substance and Sexual Activity   Alcohol use: Yes    Alcohol/week: 2.0 standard drinks of alcohol    Types: 2  Glasses of wine per week   Drug use: No   Sexual activity: Yes  Other Topics Concern   Not on file  Social History Narrative   Married.   1 child.    Working at [employer]	[place] at [employer].     Enjoys swimming, camping   Social Determinants of Health   Financial Resource Strain: Low Risk  (10/08/2022)   Overall Financial Resource Strain (CARDIA)    Difficulty of Paying Living Expenses: Not hard at all  Food Insecurity: No Food Insecurity (10/08/2022)   Hunger Vital Sign    Worried About Running Out of Food in the Last Year: Never true    Ran Out of Food in the Last Year: Never true  Transportation Needs: No Transportation Needs (10/08/2022)   PRAPARE - Administrator, Civil Service (Medical): No    Lack of  Transportation (Non-Medical): No  Physical Activity: Sufficiently Active (10/08/2022)   Exercise Vital Sign    Days of Exercise per Week: 5 days    Minutes of Exercise per Session: 30 min  Stress: No Stress Concern Present (10/08/2022)   [employer] of Occupational Health - Occupational Stress Questionnaire    Feeling of Stress : Not at all  Social Connections: Moderately Isolated (10/08/2022)   Social Connection and Isolation Panel [NHANES]    Frequency of Communication with Friends and Family: More than three times a week    Frequency of Social Gatherings with Friends and Family: More than three times a week    Attends Religious Services: Never    Database administrator or Organizations: No    Attends Engineer, structural: Not on file    Marital Status: Married  Catering manager Violence: Not At Risk (04/12/2022)   Humiliation, Afraid, Rape, and Kick questionnaire    Fear of Current or Ex-Partner: No    Emotionally Abused: No    Physically Abused: No    Sexually Abused: No    Past Surgical History:  Procedure Laterality Date   ANTERIOR AND POSTERIOR REPAIR WITH SACROSPINOUS FIXATION N/A 03/13/2018   Procedure: ANTERIOR REPAIR;  Surgeon: Nadara Mustard, MD;  Location: [facility];  Service: Gynecology;  Laterality: N/A;   ESOPHAGOGASTRODUODENOSCOPY (EGD) WITH PROPOFOL N/A 03/27/2022   Procedure: ESOPHAGOGASTRODUODENOSCOPY (EGD) WITH PROPOFOL;  Surgeon: Regis Bill, MD;  Location: [facility] END[facility];  Service: Endoscopy;  Laterality: N/A;   ESOPHAGOGASTRODUODENOSCOPY (EGD) WITH PROPOFOL N/A 04/12/2022   Procedure: ESOPHAGOGASTRODUODENOSCOPY (EGD) WITH PROPOFOL;  Surgeon: Toney Reil, MD;  Location: [facility] ENDOSCOPY;  Service: Gastroenterology;  Laterality: N/A;   INDUCED ABORTION     x2   IR CM INJ ANY COLONIC TUBE W/FLUORO  09/29/2021   LAPAROTOMY N/A 09/13/2021   Procedure: EXPLORATORY LAPAROTOMY-Graham PATCH repair;  Surgeon: Sung Amabile, DO;  Location: [facility];   Service: General;  Laterality: N/A;   LAPAROTOMY N/A 09/17/2021   Procedure: EXPLORATORY LAPAROTOMY;  Surgeon: Henrene Dodge, MD;  Location: [facility];  Service: General;  Laterality: N/A;   REPAIR OF PERFORATED ULCER N/A 09/17/2021   Procedure: REPAIR OF PERFORATED ULCER;  Surgeon: Henrene Dodge, MD;  Location: [facility];  Service: General;  Laterality: N/A;   VAGINAL HYSTERECTOMY N/A 03/13/2018   Procedure: HYSTERECTOMY VAGINAL;  Surgeon: Nadara Mustard, MD;  Location: [facility];  Service: Gynecology;  Laterality: N/A;    Family History  Problem Relation Age of Onset   Arthritis Mother    Arthritis Father    Alcohol abuse Father    Hypertension  Father    Heart disease Father    Aneurysm Father    Breast cancer Maternal Grandmother    Breast cancer Paternal Grandmother     Allergies  Allergen Reactions   Anesthesia S-I-40 [Propofol]     Lethargic after med use   Bee Venom Swelling   Bupropion Anxiety and Other (See Comments)    Worsened anxiety    Current Outpatient Medications on File Prior to Visit  Medication Sig Dispense Refill   cyclobenzaprine (FLEXERIL) 5 MG tablet TAKE 1 TABLET BY MOUTH 2 TIMES DAILY AS NEEDED FOR MUSCLE SPASMS. 30 tablet 3   EPINEPHrine (EPIPEN 2-PAK) 0.3 mg/0.3 mL IJ SOAJ injection Inject 0.3 mg into the muscle as needed for anaphylaxis. 2 each 0   omeprazole (PRILOSEC) 40 MG capsule Take 1 capsule (40 mg total) by mouth 2 (two) times daily. 60 capsule 2   triamcinolone cream (KENALOG) 0.5 % Apply 1 Application topically 2 (two) times daily as needed. 30 g 0   No current facility-administered medications on file prior to visit.    BP 126/72   Pulse 82   Temp (!) 97.2 F (36.2 C) (Temporal)   Ht 5\' 1"  (1.549 m)   Wt 119 lb (54 kg)   LMP 02/24/2018 (Approximate) Comment: spotting   SpO2 99%   BMI 22.48 kg/m  Objective:   Physical Exam Cardiovascular:     Rate and Rhythm: Normal rate and regular rhythm.  Pulmonary:     Effort: Pulmonary  effort is normal.     Breath sounds: Normal breath sounds.  Musculoskeletal:     Cervical back: Neck supple.     Comments: No calf tenderness or swelling. Bilateral lower extremities 5 out of 5 strength  Skin:    General: Skin is warm and dry.           Assessment & Plan:  Muscle cramping Assessment & Plan: Differentials include dehydration, electrolyte imbalance, rhabdomyolysis.  Discussed increased water and electrolyte intake given her working conditions. We also discussed to start stretching before bedtime.  Labs pending today including CBC, BMP, CK, urinalysis.  Await results. She will update.  Orders: -     CBC -     Basic metabolic panel -     CK -     POCT Urinalysis Dipstick (Automated)  Alcohol use disorder -     Gabapentin; Take 2 capsules (200 mg total) by mouth at bedtime.  Dispense: 180 capsule; Refill: 0        Doreene Nest, NP

## 2023-01-10 NOTE — Patient Instructions (Signed)
Stop by the lab prior to leaving today. I will notify you of your results once received.   Ensure you are consuming 64 ounces of water daily.  Start stretching every evening before bedtime.  It was a pleasure to see you today!

## 2023-01-10 NOTE — Assessment & Plan Note (Addendum)
Differentials include dehydration, electrolyte imbalance, rhabdomyolysis.  Discussed increased water and electrolyte intake given her working conditions. We also discussed to start stretching before bedtime.  Labs pending today including CBC, BMP, CK, urinalysis. UA negative.  Await results. She will update.

## 2023-01-11 ENCOUNTER — Ambulatory Visit (INDEPENDENT_AMBULATORY_CARE_PROVIDER_SITE_OTHER): Payer: BC Managed Care – PPO

## 2023-01-11 DIAGNOSIS — D7589 Other specified diseases of blood and blood-forming organs: Secondary | ICD-10-CM

## 2023-01-11 DIAGNOSIS — D649 Anemia, unspecified: Secondary | ICD-10-CM | POA: Diagnosis not present

## 2023-01-11 DIAGNOSIS — D696 Thrombocytopenia, unspecified: Secondary | ICD-10-CM | POA: Diagnosis not present

## 2023-01-11 LAB — IBC + FERRITIN
Ferritin: 58.3 ng/mL (ref 10.0–291.0)
Iron: 215 ug/dL — ABNORMAL HIGH (ref 42–145)
Saturation Ratios: 67.7 % — ABNORMAL HIGH (ref 20.0–50.0)
TIBC: 317.8 ug/dL (ref 250.0–450.0)
Transferrin: 227 mg/dL (ref 212.0–360.0)

## 2023-01-11 NOTE — Addendum Note (Signed)
Addended by: Alvina Chou on: 01/11/2023 09:28 AM   Modules accepted: Orders

## 2023-01-14 LAB — PATHOLOGIST SMEAR REVIEW

## 2023-01-18 ENCOUNTER — Ambulatory Visit: Payer: BC Managed Care – PPO | Admitting: Primary Care

## 2023-01-21 ENCOUNTER — Encounter: Payer: Self-pay | Admitting: Primary Care

## 2023-02-07 ENCOUNTER — Encounter: Payer: Self-pay | Admitting: Primary Care

## 2023-02-07 ENCOUNTER — Ambulatory Visit: Payer: BC Managed Care – PPO | Admitting: Primary Care

## 2023-02-07 ENCOUNTER — Ambulatory Visit (INDEPENDENT_AMBULATORY_CARE_PROVIDER_SITE_OTHER)
Admission: RE | Admit: 2023-02-07 | Discharge: 2023-02-07 | Disposition: A | Discharge status: Home / Self Care | Payer: BC Managed Care – PPO | Source: Ambulatory Visit | Attending: Primary Care | Admitting: Primary Care

## 2023-02-07 VITALS — BP 134/82 | HR 70 | Temp 97.3°F | Ht 61.0 in | Wt 118.0 lb

## 2023-02-07 DIAGNOSIS — M79641 Pain in right hand: Secondary | ICD-10-CM | POA: Insufficient documentation

## 2023-02-07 DIAGNOSIS — H938X3 Other specified disorders of ear, bilateral: Secondary | ICD-10-CM | POA: Diagnosis not present

## 2023-02-07 DIAGNOSIS — M19041 Primary osteoarthritis, right hand: Secondary | ICD-10-CM | POA: Diagnosis not present

## 2023-02-07 MED ORDER — FLUTICASONE PROPIONATE 50 MCG/ACT NA SUSP
1.0000 | Freq: Two times a day (BID) | NASAL | 0 refills | Status: DC
Start: 2023-02-07 — End: 2023-02-21

## 2023-02-07 NOTE — Patient Instructions (Addendum)
Complete xray(s) prior to leaving today. I will notify you of your results once received.  Try using Flonase (fluticasone) nasal spray. Instill 1 spray in each nostril twice daily.   Please notify me if your ear symptoms persist despite the Flonase.  Give it at least a week.  It was a pleasure to see you today!

## 2023-02-07 NOTE — Progress Notes (Signed)
Subjective:    Patient ID: Maria [name], female    DOB: 1970/07/16, 52 y.o.   MRN: [id number]  Fall Pertinent negatives include no fever or headaches.    Maria [name] is a very pleasant 52 y.o. female with a history of thrombocytopenia, chronic pancreatitis, alcohol use disorder, carpal tunnel syndrome bilaterally, peripheral neuropathy who presents today to discuss multiple symptoms.  She was at a wedding on 01/12/23 which had an outdoor "rocky" dance floor". While dancing she slipped and fell, hit her right hand onto the chair and ground. She hit her head to the mid frontal area on concrete.   She did have a mild headache with bleeding and bruising to her mid forehead initially. Since then this has improved, has a small knot to the location now. Denies headaches, dizziness, blurred vision.  Since then she's noticed a constant "muffled" sound to the bilateral ears, left more noticeable than right. Over the last 2-3 days she's noticed sinus pressure and nasal congestion so she's been taking OTC Sudafed PE for this.   She denies cough, fevers, chills.   Review of Systems  Constitutional:  Negative for chills and fever.  HENT:  Positive for tinnitus. Negative for ear pain and sore throat.   Eyes:  Negative for visual disturbance.  Respiratory:  Negative for cough.   Musculoskeletal:  Positive for arthralgias.  Neurological:  Negative for dizziness and headaches.         Past Medical History:  Diagnosis Date   Acute on chronic blood loss anemia 11/02/2021   Acute postoperative anemia due to expected blood loss 09/19/2021   Anemia    Anesthesia complication, initial encounter 09/13/2021   Difficulty waking up from Anesthesia requiring re-intubation   Ankle edema, bilateral 11/15/2021   Anxiety    h/o   Depression    h/o   Family history of adverse reaction to anesthesia    mom-hard time waking up   Folate deficiency 07/30/2019   Gastritis 08/19/2020   GERD  (gastroesophageal reflux disease)    tums prn   Hypertension    WAS PUT ON BP MED BY PCP LAST YEAR (2018) AND BP MED MADE PT SICK SO SHE STOPPED TAKING IT-PCP MONITORS BP NOW   Palpitations 05/18/2020   Pancreatitis    Peptic ulcer    Perforated peptic ulcer (HCC) 09/14/2021   Poison ivy dermatitis 11/13/2019   Upper GI bleed 04/11/2022   Vitamin B12 deficiency 01/17/2018   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    Social History   Socioeconomic History   Marital status: Married    Spouse name: Not on file   Number of children: Not on file   Years of education: Not on file   Highest education level: Some college, no degree  Occupational History   Not on file  Tobacco Use   Smoking status: Every Day    Current packs/day: 1.00    Average packs/day: 1 pack/day for 20.0 years (20.0 ttl pk-yrs)    Types: Cigarettes   Smokeless tobacco: Never  Vaping Use   Vaping status: Never Used  Substance and Sexual Activity   Alcohol use: Yes    Alcohol/week: 2.0 standard drinks of alcohol    Types: 2 Glasses of wine per week   Drug use: No   Sexual activity: Yes  Other Topics Concern   Not on file  Social History Narrative   Married.   1 child.    Working at [employer]	[place] at [employer].  Enjoys swimming, camping   Social Determinants of Health   Financial Resource Strain: Low Risk  (10/08/2022)   Overall Financial Resource Strain (CARDIA)    Difficulty of Paying Living Expenses: Not hard at all  Food Insecurity: No Food Insecurity (10/08/2022)   Hunger Vital Sign    Worried About Running Out of Food in the Last Year: Never true    Ran Out of Food in the Last Year: Never true  Transportation Needs: No Transportation Needs (10/08/2022)   PRAPARE - Administrator, Civil Service (Medical): No    Lack of Transportation (Non-Medical): No  Physical Activity: Sufficiently Active (10/08/2022)   Exercise Vital Sign    Days of Exercise per Week: 5 days    Minutes of Exercise per Session: 30 min   Stress: No Stress Concern Present (10/08/2022)   [employer] of Occupational Health - Occupational Stress Questionnaire    Feeling of Stress : Not at all  Social Connections: Moderately Isolated (10/08/2022)   Social Connection and Isolation Panel [NHANES]    Frequency of Communication with Friends and Family: More than three times a week    Frequency of Social Gatherings with Friends and Family: More than three times a week    Attends Religious Services: Never    Database administrator or Organizations: No    Attends Engineer, structural: Not on file    Marital Status: Married  Catering manager Violence: Not At Risk (04/12/2022)   Humiliation, Afraid, Rape, and Kick questionnaire    Fear of Current or Ex-Partner: No    Emotionally Abused: No    Physically Abused: No    Sexually Abused: No    Past Surgical History:  Procedure Laterality Date   ANTERIOR AND POSTERIOR REPAIR WITH SACROSPINOUS FIXATION N/A 03/13/2018   Procedure: ANTERIOR REPAIR;  Surgeon: Nadara Mustard, MD;  Location: [facility] ORS;  Service: Gynecology;  Laterality: N/A;   ESOPHAGOGASTRODUODENOSCOPY (EGD) WITH PROPOFOL N/A 03/27/2022   Procedure: ESOPHAGOGASTRODUODENOSCOPY (EGD) WITH PROPOFOL;  Surgeon: Regis Bill, MD;  Location: [facility];  Service: Endoscopy;  Laterality: N/A;   ESOPHAGOGASTRODUODENOSCOPY (EGD) WITH PROPOFOL N/A 04/12/2022   Procedure: ESOPHAGOGASTRODUODENOSCOPY (EGD) WITH PROPOFOL;  Surgeon: Toney Reil, MD;  Location: [facility] ENDOS[facility];  Service: Gastroenterology;  Laterality: N/A;   INDUCED ABORTION     x2   IR CM INJ ANY COLONIC TUBE W/FLUORO  09/29/2021   LAPAROTOMY N/A 09/13/2021   Procedure: EXPLORATORY LAPAROTOMY-Graham PATCH repair;  Surgeon: Sung Amabile, DO;  Location: [facility];  Service: General;  Laterality: N/A;   LAPAROTOMY N/A 09/17/2021   Procedure: EXPLORATORY LAPAROTOMY;  Surgeon: Henrene Dodge, MD;  Location: [facility];  Service: General;  Laterality: N/A;    REPAIR OF PERFORATED ULCER N/A 09/17/2021   Procedure: REPAIR OF PERFORATED ULCER;  Surgeon: Henrene Dodge, MD;  Location: [facility];  Service: General;  Laterality: N/A;   VAGINAL HYSTERECTOMY N/A 03/13/2018   Procedure: HYSTERECTOMY VAGINAL;  Surgeon: Nadara Mustard, MD;  Location: [facility];  Service: Gynecology;  Laterality: N/A;    Family History  Problem Relation Age of Onset   Arthritis Mother    Arthritis Father    Alcohol abuse Father    Hypertension Father    Heart disease Father    Aneurysm Father    Breast cancer Maternal Grandmother    Breast cancer Paternal Grandmother     Allergies  Allergen Reactions   Anesthesia S-I-40 [Propofol]     Lethargic after med use  Bee Venom Swelling   Bupropion Anxiety and Other (See Comments)    Worsened anxiety    Current Outpatient Medications on File Prior to Visit  Medication Sig Dispense Refill   cyclobenzaprine (FLEXERIL) 5 MG tablet TAKE 1 TABLET BY MOUTH 2 TIMES DAILY AS NEEDED FOR MUSCLE SPASMS. 30 tablet 3   EPINEPHrine (EPIPEN 2-PAK) 0.3 mg/0.3 mL IJ SOAJ injection Inject 0.3 mg into the muscle as needed for anaphylaxis. 2 each 0   gabapentin (NEURONTIN) 100 MG capsule Take 2 capsules (200 mg total) by mouth at bedtime. 180 capsule 0   omeprazole (PRILOSEC) 40 MG capsule Take 1 capsule (40 mg total) by mouth 2 (two) times daily. (Patient not taking: Reported on 02/07/2023) 60 capsule 2   triamcinolone cream (KENALOG) 0.5 % Apply 1 Application topically 2 (two) times daily as needed. (Patient not taking: Reported on 02/07/2023) 30 g 0   No current facility-administered medications on file prior to visit.    BP 134/82   Pulse 70   Temp (!) 97.3 F (36.3 C) (Temporal)   Ht 5\' 1"  (1.549 m)   Wt 118 lb (53.5 kg)   LMP 02/24/2018 (Approximate) Comment: spotting   SpO2 99%   BMI 22.30 kg/m  Objective:   Physical Exam HENT:     Right Ear: Tympanic membrane and ear canal normal. Tympanic membrane is not erythematous or  bulging.     Left Ear: Tympanic membrane is bulging. Tympanic membrane is not erythematous.  Eyes:     Extraocular Movements: Extraocular movements intact.  Cardiovascular:     Rate and Rhythm: Normal rate and regular rhythm.  Pulmonary:     Effort: Pulmonary effort is normal.     Breath sounds: Normal breath sounds.  Musculoskeletal:     Right hand: Tenderness and bony tenderness present. Normal range of motion.       Hands:     Comments: Tenderness to right second metacarpal joint and bone.  Skin:    General: Skin is warm and dry.  Neurological:     Mental Status: She is alert.     Cranial Nerves: No cranial nerve deficit.     Coordination: Coordination normal.           Assessment & Plan:  Hand pain, right Assessment & Plan: Status post fall.  Given tenderness on exam, coupled with trauma, will obtain x-ray today.  X-ray of the right hand ordered and pending.  Orders: -     DG Hand Complete Right  Ear fullness, bilateral Assessment & Plan: Exam today with moderate bulging to left TM without infection. This could be contributing to her muffled ear sensation.  Start Flonase nasal spray, 1 spray each nostril twice daily. Consider prednisone course if no improvement.  There are no cranial symptoms to suggest internal head trauma from her fall. Neuroexam today negative.  Orders: -     Fluticasone Propionate; Place 1 spray into both nostrils 2 (two) times daily.  Dispense: 16 g; Refill: 0        Doreene Nest, NP

## 2023-02-07 NOTE — Assessment & Plan Note (Signed)
Status post fall.  Given tenderness on exam, coupled with trauma, will obtain x-ray today.  X-ray of the right hand ordered and pending.

## 2023-02-07 NOTE — Assessment & Plan Note (Signed)
Exam today with moderate bulging to left TM without infection. This could be contributing to her muffled ear sensation.  Start Flonase nasal spray, 1 spray each nostril twice daily. Consider prednisone course if no improvement.  There are no cranial symptoms to suggest internal head trauma from her fall. Neuroexam today negative.

## 2023-02-21 ENCOUNTER — Other Ambulatory Visit: Payer: Self-pay | Admitting: Primary Care

## 2023-02-21 DIAGNOSIS — H938X3 Other specified disorders of ear, bilateral: Secondary | ICD-10-CM

## 2023-04-09 NOTE — Progress Notes (Unsigned)
[name], MD, CAQ Sports Medicine [facility] at [facility] [address] [place], [address]  Phone: [phone]  FAX: [phone]  Maria [name] - 52 y.o. female  MRN [id number]  Date of Birth: 09-01-1970  Date: 04/10/2023  PCP: Doreene Nest, NP  Referral: Doreene Nest, NP  No chief complaint on file.   Patient presents for trigger finger evaluation.

## 2023-04-10 ENCOUNTER — Ambulatory Visit: Payer: BC Managed Care – PPO | Admitting: Primary Care

## 2023-04-10 ENCOUNTER — Ambulatory Visit: Payer: BC Managed Care – PPO | Admitting: Family Medicine

## 2023-04-10 ENCOUNTER — Encounter: Payer: Self-pay | Admitting: Family Medicine

## 2023-04-10 VITALS — BP 126/82 | HR 63 | Temp 98.6°F | Ht 61.0 in | Wt 123.0 lb

## 2023-04-10 DIAGNOSIS — M65331 Trigger finger, right middle finger: Secondary | ICD-10-CM

## 2023-04-10 DIAGNOSIS — M546 Pain in thoracic spine: Secondary | ICD-10-CM

## 2023-04-10 DIAGNOSIS — Z23 Encounter for immunization: Secondary | ICD-10-CM | POA: Diagnosis not present

## 2023-04-10 MED ORDER — TRIAMCINOLONE ACETONIDE 40 MG/ML IJ SUSP
40.0000 mg | Freq: Once | INTRAMUSCULAR | Status: AC
Start: 2023-04-10 — End: 2023-04-10
  Administered 2023-04-10: 40 mg via INTRAMUSCULAR

## 2023-04-10 MED ORDER — CYCLOBENZAPRINE HCL 5 MG PO TABS
5.0000 mg | ORAL_TABLET | Freq: Three times a day (TID) | ORAL | 3 refills | Status: DC | PRN
Start: 2023-04-10 — End: 2023-05-03

## 2023-04-10 NOTE — Addendum Note (Signed)
Addended by: Donnamarie Poag on: 04/10/2023 10:27 AM   Modules accepted: Orders

## 2023-04-12 ENCOUNTER — Other Ambulatory Visit: Payer: Self-pay

## 2023-04-12 DIAGNOSIS — Z1211 Encounter for screening for malignant neoplasm of colon: Secondary | ICD-10-CM

## 2023-04-12 DIAGNOSIS — Z1231 Encounter for screening mammogram for malignant neoplasm of breast: Secondary | ICD-10-CM

## 2023-04-24 ENCOUNTER — Telehealth: Payer: BC Managed Care – PPO | Admitting: Physician Assistant

## 2023-04-24 DIAGNOSIS — B9689 Other specified bacterial agents as the cause of diseases classified elsewhere: Secondary | ICD-10-CM

## 2023-04-24 DIAGNOSIS — J069 Acute upper respiratory infection, unspecified: Secondary | ICD-10-CM

## 2023-04-24 MED ORDER — AMOXICILLIN-POT CLAVULANATE 875-125 MG PO TABS: 1.0000 | ORAL_TABLET | Freq: Two times a day (BID) | ORAL | 0 refills | Status: DC

## 2023-04-24 MED ORDER — PROMETHAZINE-DM 6.25-15 MG/5ML PO SYRP
5.0000 mL | ORAL_SOLUTION | Freq: Four times a day (QID) | ORAL | 0 refills | Status: DC | PRN
Start: 2023-04-24 — End: 2023-05-03

## 2023-04-24 NOTE — Progress Notes (Signed)
Virtual Visit Consent   Maria [name], you are scheduled for a virtual visit with a  provider today. Just as with appointments in the office, your consent must be obtained to participate. Your consent will be active for this visit and any virtual visit you may have with one of our providers in the next 365 days. If you have a MyChart account, a copy of this consent can be sent to you electronically.  As this is a virtual visit, video technology does not allow for your provider to perform a traditional examination. This may limit your provider's ability to fully assess your condition. If your provider identifies any concerns that need to be evaluated in person or the need to arrange testing (such as labs, EKG, etc.), we will make arrangements to do so. Although advances in technology are sophisticated, we cannot ensure that it will always work on either your end or our end. If the connection with a video visit is poor, the visit may have to be switched to a telephone visit. With either a video or telephone visit, we are not always able to ensure that we have a secure connection.  By engaging in this virtual visit, you consent to the provision of healthcare and authorize for your insurance to be billed (if applicable) for the services provided during this visit. Depending on your insurance coverage, you may receive a charge related to this service.  I need to obtain your verbal consent now. Are you willing to proceed with your visit today? [name] has provided verbal consent on 04/24/2023 for a virtual visit (video or telephone). Margaretann Loveless, PA-C  Date: 04/24/2023 7:42 AM  Virtual Visit via Video Note   I, Margaretann Loveless, connected with  Maria [name]  ([id number], 18-Jan-1971) on 04/24/23 at  7:45 AM EDT by a video-enabled telemedicine application and verified that I am speaking with the correct person using two identifiers.  Location: Patient: Virtual Visit  Location Patient: Home Provider: Virtual Visit Location Provider: Home Office   I discussed the limitations of evaluation and management by telemedicine and the availability of in person appointments. The patient expressed understanding and agreed to proceed.    History of Present Illness: Maria [name] is a 52 y.o. who identifies as a female who was assigned female at birth, and is being seen today for URI symptoms.  HPI: Sinusitis This is a new problem. The current episode started in the past 7 days. The problem has been gradually worsening since onset. There has been no fever. Associated symptoms include congestion, coughing (worsened over the last 24 hours; now productive with purulent mucus), headaches and a sore throat. Pertinent negatives include no chills, ear pain, hoarse voice or sinus pressure. (Nasal congestion, weak) Treatments tried: mucinex, alka seltzer cold plus, flonase. The treatment provided no relief.     Problems:  Patient Active Problem List   Diagnosis Date Noted   Hand pain, right 02/07/2023   Ear fullness, bilateral 02/07/2023   Muscle cramping 01/10/2023   Foot pain 10/03/2022   Hypomagnesemia 04/11/2022   AKI (acute kidney injury) (HCC) 09/18/2021   Alcohol use disorder 09/18/2021   Malnutrition of moderate degree 09/15/2021   PUD (peptic ulcer disease) 09/14/2021   Chronic pancreatitis (HCC) 09/13/2021   Back pain 07/23/2021   Vitamin B1 deficiency 05/29/2021   Macrocytosis 05/17/2021   Thrombocytopenia (HCC) 02/05/2021   History of seasonal allergies 05/18/2020   Fatigue 12/09/2019   Poison ivy dermatitis  11/13/2019   Depression, recurrent (HCC) 11/13/2019   Vitamin D deficiency 07/30/2019   Encounter for annual general medical examination with abnormal findings in adult 07/30/2019   Bee sting allergy 11/11/2018   Uterine prolapse 02/28/2018   Bilateral carpal tunnel syndrome 11/13/2017   Peripheral neuropathy 12/24/2016    Allergies:   Allergies  Allergen Reactions   Anesthesia S-I-40 [Propofol]     Lethargic after med use   Bee Venom Swelling   Bupropion Anxiety and Other (See Comments)    Worsened anxiety   Medications:  Current Outpatient Medications:    cyclobenzaprine (FLEXERIL) 5 MG tablet, Take 1 tablet (5 mg total) by mouth 3 (three) times daily as needed for muscle spasms., Disp: 30 tablet, Rfl: 3   EPINEPHrine (EPIPEN 2-PAK) 0.3 mg/0.3 mL IJ SOAJ injection, Inject 0.3 mg into the muscle as needed for anaphylaxis., Disp: 2 each, Rfl: 0   fluticasone (FLONASE) 50 MCG/ACT nasal spray, PLACE 1 SPRAY INTO BOTH NOSTRILS 2 (TWO) TIMES DAILY, Disp: 48 mL, Rfl: 0   gabapentin (NEURONTIN) 100 MG capsule, Take 2 capsules (200 mg total) by mouth at bedtime., Disp: 180 capsule, Rfl: 0  Observations/Objective: Patient is well-developed, well-nourished in no acute distress.  Resting comfortably at home.  Head is normocephalic, atraumatic.  No labored breathing.  Speech is clear and coherent with logical content.  Patient is alert and oriented at baseline.    Assessment and Plan: There are no diagnoses linked to this encounter. - Worsening symptoms that have not responded to OTC medications.  - Will give Augmentin and Promethazine DM - Continue Mucinex and Flonase - Steam and humidifier can help - Stay well hydrated and get plenty of rest.  - Seek in person evaluation if no symptom improvement or if symptoms worsen   Follow Up Instructions: I discussed the assessment and treatment plan with the patient. The patient was provided an opportunity to ask questions and all were answered. The patient agreed with the plan and demonstrated an understanding of the instructions.  A copy of instructions were sent to the patient via MyChart unless otherwise noted below.    The patient was advised to call back or seek an in-person evaluation if the symptoms worsen or if the condition fails to improve as anticipated.     Margaretann Loveless, PA-C

## 2023-04-24 NOTE — Patient Instructions (Signed)
Maria [name], thank you for joining Margaretann Loveless, PA-C for today's virtual visit.  While this provider is not your primary care provider (PCP), if your PCP is located in our provider database this encounter information will be shared with them immediately following your visit.   A [facility] MyChart account gives you access to today's visit and all your visits, tests, and labs performed at [facility] " click here if you don't have a [facility] MyChart account or go to [email or web address]  Consent: (Patient) Maria [name] provided verbal consent for this virtual visit at the beginning of the encounter.  Current Medications:  Current Outpatient Medications:    amoxicillin-clavulanate (AUGMENTIN) 875-125 MG tablet, Take 1 tablet by mouth 2 (two) times daily., Disp: 20 tablet, Rfl: 0   promethazine-dextromethorphan (PROMETHAZINE-DM) 6.25-15 MG/5ML syrup, Take 5 mLs by mouth 4 (four) times daily as needed., Disp: 118 mL, Rfl: 0   cyclobenzaprine (FLEXERIL) 5 MG tablet, Take 1 tablet (5 mg total) by mouth 3 (three) times daily as needed for muscle spasms., Disp: 30 tablet, Rfl: 3   EPINEPHrine (EPIPEN 2-PAK) 0.3 mg/0.3 mL IJ SOAJ injection, Inject 0.3 mg into the muscle as needed for anaphylaxis., Disp: 2 each, Rfl: 0   fluticasone (FLONASE) 50 MCG/ACT nasal spray, PLACE 1 SPRAY INTO BOTH NOSTRILS 2 (TWO) TIMES DAILY, Disp: 48 mL, Rfl: 0   gabapentin (NEURONTIN) 100 MG capsule, Take 2 capsules (200 mg total) by mouth at bedtime., Disp: 180 capsule, Rfl: 0   Medications ordered in this encounter:  Meds ordered this encounter  Medications   amoxicillin-clavulanate (AUGMENTIN) 875-125 MG tablet    Sig: Take 1 tablet by mouth 2 (two) times daily.    Dispense:  20 tablet    Refill:  0    Order Specific Question:   Supervising Provider    Answer:   Merrilee Jansky [id number]   promethazine-dextromethorphan (PROMETHAZINE-DM) 6.25-15 MG/5ML syrup    Sig: Take 5 mLs by  mouth 4 (four) times daily as needed.    Dispense:  118 mL    Refill:  0    Order Specific Question:   Supervising Provider    Answer:   Merrilee Jansky [[id number]]     *If you need refills on other medications prior to your next appointment, please contact your pharmacy*  Follow-Up: Call back or seek an in-person evaluation if the symptoms worsen or if the condition fails to improve as anticipated.  [facility] [phone]  Other Instructions Upper Respiratory Infection, Adult An upper respiratory infection (URI) is a common viral infection of the nose, throat, and upper air passages that lead to the lungs. The most common type of URI is the common cold. URIs usually get better on their own, without medical treatment. What are the causes? A URI is caused by a virus. You may catch a virus by: Breathing in droplets from an infected person's cough or sneeze. Touching something that has been exposed to the virus (is contaminated) and then touching your mouth, nose, or eyes. What increases the risk? You are more likely to get a URI if: You are very young or very old. You have close contact with others, such as at work, school, or a health care facility. You smoke. You have long-term (chronic) heart or lung disease. You have a weakened disease-fighting system (immune system). You have nasal allergies or asthma. You are experiencing a lot of stress. You have poor nutrition. What are the  signs or symptoms? A URI usually involves some of the following symptoms: Runny or stuffy (congested) nose. Cough. Sneezing. Sore throat. Headache. Fatigue. Fever. Loss of appetite. Pain in your forehead, behind your eyes, and over your cheekbones (sinus pain). Muscle aches. Redness or irritation of the eyes. Pressure in the ears or face. How is this diagnosed? This condition may be diagnosed based on your medical history and symptoms, and a physical exam. Your health care  provider may use a swab to take a mucus sample from your nose (nasal swab). This sample can be tested to determine what virus is causing the illness. How is this treated? URIs usually get better on their own within 7-10 days. Medicines cannot cure URIs, but your health care provider may recommend certain medicines to help relieve symptoms, such as: Over-the-counter cold medicines. Cough suppressants. Coughing is a type of defense against infection that helps to clear the respiratory system, so take these medicines only as recommended by your health care provider. Fever-reducing medicines. Follow these instructions at home: Activity Rest as needed. If you have a fever, stay home from work or school until your fever is gone or until your health care provider says your URI cannot spread to other people (is no longer contagious). Your health care provider may have you wear a face mask to prevent your infection from spreading. Relieving symptoms Gargle with a mixture of salt and water 3-4 times a day or as needed. To make salt water, completely dissolve -1 tsp (3-6 g) of salt in 1 cup (237 mL) of warm water. Use a cool-mist humidifier to add moisture to the air. This can help you breathe more easily. Eating and drinking  Drink enough fluid to keep your urine pale yellow. Eat soups and other clear broths. General instructions  Take over-the-counter and prescription medicines only as told by your health care provider. These include cold medicines, fever reducers, and cough suppressants. Do not use any products that contain nicotine or tobacco. These products include cigarettes, chewing tobacco, and vaping devices, such as e-cigarettes. If you need help quitting, ask your health care provider. Stay away from secondhand smoke. Stay up to date on all immunizations, including the yearly (annual) flu vaccine. Keep all follow-up visits. This is important. How to prevent the spread of infection to  others URIs can be contagious. To prevent the infection from spreading: Wash your hands with soap and water for at least 20 seconds. If soap and water are not available, use hand sanitizer. Avoid touching your mouth, face, eyes, or nose. Cough or sneeze into a tissue or your sleeve or elbow instead of into your hand or into the air.  Contact a health care provider if: You are getting worse instead of better. You have a fever or chills. Your mucus is brown or red. You have yellow or brown discharge coming from your nose. You have pain in your face, especially when you bend forward. You have swollen neck glands. You have pain while swallowing. You have white areas in the back of your throat. Get help right away if: You have shortness of breath that gets worse. You have severe or persistent: Headache. Ear pain. Sinus pain. Chest pain. You have chronic lung disease along with any of the following: Making high-pitched whistling sounds when you breathe, most often when you breathe out (wheezing). Prolonged cough (more than 14 days). Coughing up blood. A change in your usual mucus. You have a stiff neck. You have changes in your:  Vision. Hearing. Thinking. Mood. These symptoms may be an emergency. Get help right away. Call 911. Do not wait to see if the symptoms will go away. Do not drive yourself to the hospital. Summary An upper respiratory infection (URI) is a common infection of the nose, throat, and upper air passages that lead to the lungs. A URI is caused by a virus. URIs usually get better on their own within 7-10 days. Medicines cannot cure URIs, but your health care provider may recommend certain medicines to help relieve symptoms. This information is not intended to replace advice given to you by your health care provider. Make sure you discuss any questions you have with your health care provider. Document Revised: 01/18/2021 Document Reviewed: 01/18/2021 Elsevier  Patient Education  2024 Elsevier Inc.    If you have been instructed to have an in-person evaluation today at a local Urgent Care facility, please use the link below. It will take you to a list of all of our available [facility], including address, phone number and hours of operation. Please do not delay care.  [facility]  If you or a family member do not have a primary care provider, use the link below to schedule a visit and establish care. When you choose a [facility] primary care physician or advanced practice provider, you gain a long-term partner in health. Find a Primary Care Provider  Learn more about [facility]'s in-office and virtual care options: [facility] - Get Care Now

## 2023-04-26 ENCOUNTER — Encounter: Payer: Self-pay | Admitting: Physician Assistant

## 2023-04-26 DIAGNOSIS — B9689 Other specified bacterial agents as the cause of diseases classified elsewhere: Secondary | ICD-10-CM

## 2023-04-26 MED ORDER — PREDNISONE 20 MG PO TABS
40.0000 mg | ORAL_TABLET | Freq: Every day | ORAL | 0 refills | Status: DC
Start: 2023-04-26 — End: 2023-05-03

## 2023-05-01 ENCOUNTER — Telehealth: Payer: BC Managed Care – PPO | Admitting: Family Medicine

## 2023-05-01 DIAGNOSIS — R053 Chronic cough: Secondary | ICD-10-CM

## 2023-05-01 NOTE — Patient Instructions (Signed)
  Maria [name], thank you for joining Freddy Finner, NP for today's virtual visit.  While this provider is not your primary care provider (PCP), if your PCP is located in our provider database this encounter information will be shared with them immediately following your visit.   A [facility] MyChart account gives you access to today's visit and all your visits, tests, and labs performed at [facility] " click here if you don't have a [facility] MyChart account or go to [email or web address]  Consent: (Patient) Maria [name] provided verbal consent for this virtual visit at the beginning of the encounter.  Current Medications:  Current Outpatient Medications:    amoxicillin-clavulanate (AUGMENTIN) 875-125 MG tablet, Take 1 tablet by mouth 2 (two) times daily., Disp: 20 tablet, Rfl: 0   cyclobenzaprine (FLEXERIL) 5 MG tablet, Take 1 tablet (5 mg total) by mouth 3 (three) times daily as needed for muscle spasms., Disp: 30 tablet, Rfl: 3   EPINEPHrine (EPIPEN 2-PAK) 0.3 mg/0.3 mL IJ SOAJ injection, Inject 0.3 mg into the muscle as needed for anaphylaxis., Disp: 2 each, Rfl: 0   fluticasone (FLONASE) 50 MCG/ACT nasal spray, PLACE 1 SPRAY INTO BOTH NOSTRILS 2 (TWO) TIMES DAILY, Disp: 48 mL, Rfl: 0   gabapentin (NEURONTIN) 100 MG capsule, Take 2 capsules (200 mg total) by mouth at bedtime., Disp: 180 capsule, Rfl: 0   predniSONE (DELTASONE) 20 MG tablet, Take 2 tablets (40 mg total) by mouth daily with breakfast., Disp: 10 tablet, Rfl: 0   promethazine-dextromethorphan (PROMETHAZINE-DM) 6.25-15 MG/5ML syrup, Take 5 mLs by mouth 4 (four) times daily as needed., Disp: 118 mL, Rfl: 0   Medications ordered in this encounter:  No orders of the defined types were placed in this encounter.    *If you need refills on other medications prior to your next appointment, please contact your pharmacy*  Follow-Up: Call back or seek an in-person evaluation if the symptoms worsen or if the  condition fails to improve as anticipated.  [facility] [phone]  Other Instructions   If you have been instructed to have an in-person evaluation today at a local Urgent Care facility, please use the link below. It will take you to a list of all of our available [facility], including address, phone number and hours of operation. Please do not delay care.  [facility]  If you or a family member do not have a primary care provider, use the link below to schedule a visit and establish care. When you choose a [facility] primary care physician or advanced practice provider, you gain a long-term partner in health. Find a Primary Care Provider  Learn more about [facility]'s in-office and virtual care options: [facility] - Get Care Now

## 2023-05-01 NOTE — Progress Notes (Signed)
Virtual Visit Consent   Maria [name], you are scheduled for a virtual visit with a [facility] provider today. Just as with appointments in the office, your consent must be obtained to participate. Your consent will be active for this visit and any virtual visit you may have with one of our providers in the next 365 days. If you have a MyChart account, a copy of this consent can be sent to you electronically.  As this is a virtual visit, video technology does not allow for your provider to perform a traditional examination. This may limit your provider's ability to fully assess your condition. If your provider identifies any concerns that need to be evaluated in person or the need to arrange testing (such as labs, EKG, etc.), we will make arrangements to do so. Although advances in technology are sophisticated, we cannot ensure that it will always work on either your end or our end. If the connection with a video visit is poor, the visit may have to be switched to a telephone visit. With either a video or telephone visit, we are not always able to ensure that we have a secure connection.  By engaging in this virtual visit, you consent to the provision of healthcare and authorize for your insurance to be billed (if applicable) for the services provided during this visit. Depending on your insurance coverage, you may receive a charge related to this service.  I need to obtain your verbal consent now. Are you willing to proceed with your visit today? Maria [name] has provided verbal consent on 05/01/2023 for a virtual visit (video or telephone). Freddy Finner, NP  Date: 05/01/2023 9:54 AM  Virtual Visit via Video Note   I, Freddy Finner, connected with  Maria [name]  ([id number], 1970-12-28) on 05/01/23 at 10:00 AM EDT by a video-enabled telemedicine application and verified that I am speaking with the correct person using two identifiers.  Location: Patient: Virtual Visit Location Patient:  Home Provider: Virtual Visit Location Provider: Home Office   I discussed the limitations of evaluation and management by telemedicine and the availability of in person appointments. The patient expressed understanding and agreed to proceed.    History of Present Illness: Maria [name] is a 52 y.o. who identifies as a female who was assigned female at birth, and is being seen today for cough  Persistent cough after treatment for bacterial URI with Augmentin, Prometh DM and Prednisone. Reports a rattle, but limited mucus production. Denies chest pain, excessive shortness of breath, fevers or chills.   Problems:  Patient Active Problem List   Diagnosis Date Noted   Hand pain, right 02/07/2023   Ear fullness, bilateral 02/07/2023   Muscle cramping 01/10/2023   Foot pain 10/03/2022   Hypomagnesemia 04/11/2022   AKI (acute kidney injury) (HCC) 09/18/2021   Alcohol use disorder 09/18/2021   Malnutrition of moderate degree 09/15/2021   PUD (peptic ulcer disease) 09/14/2021   Chronic pancreatitis (HCC) 09/13/2021   Back pain 07/23/2021   Vitamin B1 deficiency 05/29/2021   Macrocytosis 05/17/2021   Thrombocytopenia (HCC) 02/05/2021   History of seasonal allergies 05/18/2020   Fatigue 12/09/2019   Poison ivy dermatitis 11/13/2019   Depression, recurrent (HCC) 11/13/2019   Vitamin D deficiency 07/30/2019   Encounter for annual general medical examination with abnormal findings in adult 07/30/2019   Bee sting allergy 11/11/2018   Uterine prolapse 02/28/2018   Bilateral carpal tunnel syndrome 11/13/2017   Peripheral neuropathy 12/24/2016  Allergies:  Allergies  Allergen Reactions   Anesthesia S-I-40 [Propofol]     Lethargic after med use   Bee Venom Swelling   Bupropion Anxiety and Other (See Comments)    Worsened anxiety   Medications:  Current Outpatient Medications:    amoxicillin-clavulanate (AUGMENTIN) 875-125 MG tablet, Take 1 tablet by mouth 2 (two) times daily., Disp:  20 tablet, Rfl: 0   cyclobenzaprine (FLEXERIL) 5 MG tablet, Take 1 tablet (5 mg total) by mouth 3 (three) times daily as needed for muscle spasms., Disp: 30 tablet, Rfl: 3   EPINEPHrine (EPIPEN 2-PAK) 0.3 mg/0.3 mL IJ SOAJ injection, Inject 0.3 mg into the muscle as needed for anaphylaxis., Disp: 2 each, Rfl: 0   fluticasone (FLONASE) 50 MCG/ACT nasal spray, PLACE 1 SPRAY INTO BOTH NOSTRILS 2 (TWO) TIMES DAILY, Disp: 48 mL, Rfl: 0   gabapentin (NEURONTIN) 100 MG capsule, Take 2 capsules (200 mg total) by mouth at bedtime., Disp: 180 capsule, Rfl: 0   predniSONE (DELTASONE) 20 MG tablet, Take 2 tablets (40 mg total) by mouth daily with breakfast., Disp: 10 tablet, Rfl: 0   promethazine-dextromethorphan (PROMETHAZINE-DM) 6.25-15 MG/5ML syrup, Take 5 mLs by mouth 4 (four) times daily as needed., Disp: 118 mL, Rfl: 0  Observations/Objective: Patient is well-developed, well-nourished in no acute distress.  Resting comfortably  at home.  Head is normocephalic, atraumatic.  No labored breathing.  Speech is clear and coherent with logical content.  Patient is alert and oriented at baseline.    Assessment and Plan:  1. Persistent cough  -given virtual care treatment with first line medications and additionally prednisone, it is advised she be seen in person for lung assessment and need for xray if warranted.   -might have a lingering post acute cough, but given increased in PNA cases in community it would be best to have her in person at this time.  Reviewed side effects, risks and benefits of medication.    Patient acknowledged agreement and understanding of the plan.   Past Medical, Surgical, Social History, Allergies, and Medications have been Reviewed.     Follow Up Instructions: I discussed the assessment and treatment plan with the patient. The patient was provided an opportunity to ask questions and all were answered. The patient agreed with the plan and demonstrated an understanding  of the instructions.  A copy of instructions were sent to the patient via MyChart unless otherwise noted below.    The patient was advised to call back or seek an in-person evaluation if the symptoms worsen or if the condition fails to improve as anticipated.    Freddy Finner, NP

## 2023-05-03 ENCOUNTER — Encounter: Payer: Self-pay | Admitting: Primary Care

## 2023-05-03 ENCOUNTER — Ambulatory Visit: Payer: BC Managed Care – PPO | Admitting: Primary Care

## 2023-05-03 ENCOUNTER — Ambulatory Visit (INDEPENDENT_AMBULATORY_CARE_PROVIDER_SITE_OTHER)
Admission: RE | Admit: 2023-05-03 | Discharge: 2023-05-03 | Disposition: A | Discharge status: Home / Self Care | Payer: BC Managed Care – PPO | Source: Ambulatory Visit | Attending: Primary Care | Admitting: Primary Care

## 2023-05-03 ENCOUNTER — Other Ambulatory Visit: Payer: Self-pay | Admitting: Family Medicine

## 2023-05-03 VITALS — BP 148/84 | HR 82 | Temp 97.8°F | Ht 61.0 in | Wt 121.0 lb

## 2023-05-03 DIAGNOSIS — R051 Acute cough: Secondary | ICD-10-CM

## 2023-05-03 DIAGNOSIS — R059 Cough, unspecified: Secondary | ICD-10-CM | POA: Diagnosis not present

## 2023-05-03 DIAGNOSIS — M546 Pain in thoracic spine: Secondary | ICD-10-CM

## 2023-05-03 MED ORDER — AZITHROMYCIN 250 MG PO TABS: ORAL_TABLET | ORAL | 0 refills | Status: AC

## 2023-05-03 NOTE — Patient Instructions (Signed)
Start Azithromycin antibiotics for infection. Take 2 tablets by mouth today, then 1 tablet daily for 4 additional days.  Complete xray(s) prior to leaving today. I will notify you of your results once received.  It was a pleasure to see you today!

## 2023-05-03 NOTE — Progress Notes (Signed)
Subjective:    Patient ID: Maria [name], female    DOB: [birth year], 52 y.o.   MRN: [id number]  HPI  Maria [name] is a very pleasant 52 y.o. female with a history of alcohol use disorder, chronic pancreatitis, PUD, thrombocytopenia, tobacco use who presents today to discuss acute cough.  Symptom onset 2 weeks ago with cough, fatigue, head congestion. She then noticed chest congestion with difficulty coughing up sputum.  She took a home COVID test which was negative.  She completed an E-visit on 04/24/23, was prescribed Augmentin 875-125 mcg BID x 10 days and prednisone 40 mg daily x 5 days.  She completed both courses entirely.  Since then she continues to experience chest heaviness, "rattling in my chest". Her cough is productive sometimes, clear/green sputum.  She felt slightly better temporarily, now feels about the same as she did prior to treatment.  She denies shortness of breath, chest tightness.  She has been taking Mucinex OTC.  Review of Systems  Constitutional:  Positive for fatigue. Negative for chills and fever.  HENT:  Positive for congestion. Negative for ear pain, postnasal drip and sore throat.   Respiratory:  Positive for cough. Negative for chest tightness and shortness of breath.          Past Medical History:  Diagnosis Date   Acute on chronic blood loss anemia 11/02/2021   Acute postoperative anemia due to expected blood loss 09/19/2021   Anemia    Anesthesia complication, initial encounter 09/13/2021   Difficulty waking up from Anesthesia requiring re-intubation   Ankle edema, bilateral 11/15/2021   Anxiety    h/o   Depression    h/o   Family history of adverse reaction to anesthesia    mom-hard time waking up   Folate deficiency 07/30/2019   Gastritis 08/19/2020   GERD (gastroesophageal reflux disease)    tums prn   Hypertension    WAS PUT ON BP MED BY PCP LAST YEAR (2018) AND BP MED MADE PT SICK SO SHE STOPPED TAKING IT-PCP MONITORS BP NOW    Palpitations 05/18/2020   Pancreatitis    Peptic ulcer    Perforated peptic ulcer (HCC) 09/14/2021   Poison ivy dermatitis 11/13/2019   Upper GI bleed 04/11/2022   Vitamin B12 deficiency 01/17/2018   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    Social History   Socioeconomic History   Marital status: Married    Spouse name: Not on file   Number of children: Not on file   Years of education: Not on file   Highest education level: Some college, no degree  Occupational History   Not on file  Tobacco Use   Smoking status: Every Day    Current packs/day: 1.00    Average packs/day: 1 pack/day for 20.0 years (20.0 ttl pk-yrs)    Types: Cigarettes   Smokeless tobacco: Never  Vaping Use   Vaping status: Never Used  Substance and Sexual Activity   Alcohol use: Yes    Alcohol/week: 2.0 standard drinks of alcohol    Types: 2 Glasses of wine per week   Drug use: No   Sexual activity: Yes  Other Topics Concern   Not on file  Social History Narrative   Married.   1 child.    Working at [employer]	[place] at [employer].     Enjoys swimming, camping   Social Determinants of Health   Financial Resource Strain: Low Risk  (10/08/2022)   Overall Financial Resource Strain (CARDIA)  Difficulty of Paying Living Expenses: Not hard at all  Food Insecurity: No Food Insecurity (10/08/2022)   Hunger Vital Sign    Worried About Running Out of Food in the Last Year: Never true    Ran Out of Food in the Last Year: Never true  Transportation Needs: No Transportation Needs (10/08/2022)   PRAPARE - Administrator, Civil Service (Medical): No    Lack of Transportation (Non-Medical): No  Physical Activity: Sufficiently Active (10/08/2022)   Exercise Vital Sign    Days of Exercise per Week: 5 days    Minutes of Exercise per Session: 30 min  Stress: No Stress Concern Present (10/08/2022)   [employer] of Occupational Health - Occupational Stress Questionnaire    Feeling of Stress : Not at all  Social  Connections: Moderately Isolated (10/08/2022)   Social Connection and Isolation Panel [NHANES]    Frequency of Communication with Friends and Family: More than three times a week    Frequency of Social Gatherings with Friends and Family: More than three times a week    Attends Religious Services: Never    Database administrator or Organizations: No    Attends Engineer, structural: Not on file    Marital Status: Married  Catering manager Violence: Not At Risk (04/12/2022)   Humiliation, Afraid, Rape, and Kick questionnaire    Fear of Current or Ex-Partner: No    Emotionally Abused: No    Physically Abused: No    Sexually Abused: No    Past Surgical History:  Procedure Laterality Date   ANTERIOR AND POSTERIOR REPAIR WITH SACROSPINOUS FIXATION N/A 03/13/2018   Procedure: ANTERIOR REPAIR;  Surgeon: Nadara Mustard, MD;  Location: [facility];  Service: Gynecology;  Laterality: N/A;   ESOPHAGOGASTRODUODENOSCOPY (EGD) WITH PROPOFOL N/A 03/27/2022   Procedure: ESOPHAGOGASTRODUODENOSCOPY (EGD) WITH PROPOFOL;  Surgeon: Regis Bill, MD;  Location: [facility];  Service: Endoscopy;  Laterality: N/A;   ESOPHAGOGASTRODUODENOSCOPY (EGD) WITH PROPOFOL N/A 04/12/2022   Procedure: ESOPHAGOGASTRODUODENOSCOPY (EGD) WITH PROPOFOL;  Surgeon: Toney Reil, MD;  Location: [facility];  Service: Gastroenterology;  Laterality: N/A;   INDUCED ABORTION     x2   IR CM INJ ANY COLONIC TUBE W/FLUORO  09/29/2021   LAPAROTOMY N/A 09/13/2021   Procedure: EXPLORATORY LAPAROTOMY-Graham PATCH repair;  Surgeon: Sung Amabile, DO;  Location: [facility];  Service: General;  Laterality: N/A;   LAPAROTOMY N/A 09/17/2021   Procedure: EXPLORATORY LAPAROTOMY;  Surgeon: Henrene Dodge, MD;  Location: [facility];  Service: General;  Laterality: N/A;   REPAIR OF PERFORATED ULCER N/A 09/17/2021   Procedure: REPAIR OF PERFORATED ULCER;  Surgeon: Henrene Dodge, MD;  Location: [facility];  Service: General;  Laterality:  N/A;   VAGINAL HYSTERECTOMY N/A 03/13/2018   Procedure: HYSTERECTOMY VAGINAL;  Surgeon: Nadara Mustard, MD;  Location: [facility];  Service: Gynecology;  Laterality: N/A;    Family History  Problem Relation Age of Onset   Arthritis Mother    Arthritis Father    Alcohol abuse Father    Hypertension Father    Heart disease Father    Aneurysm Father    Breast cancer Maternal Grandmother    Breast cancer Paternal Grandmother     Allergies  Allergen Reactions   Anesthesia S-I-40 [Propofol]     Lethargic after med use   Bee Venom Swelling   Bupropion Anxiety and Other (See Comments)    Worsened anxiety    Current Outpatient Medications on File Prior to  Visit  Medication Sig Dispense Refill   cyclobenzaprine (FLEXERIL) 5 MG tablet Take 1 tablet (5 mg total) by mouth 3 (three) times daily as needed for muscle spasms. 30 tablet 3   EPINEPHrine (EPIPEN 2-PAK) 0.3 mg/0.3 mL IJ SOAJ injection Inject 0.3 mg into the muscle as needed for anaphylaxis. 2 each 0   fluticasone (FLONASE) 50 MCG/ACT nasal spray PLACE 1 SPRAY INTO BOTH NOSTRILS 2 (TWO) TIMES DAILY 48 mL 0   gabapentin (NEURONTIN) 100 MG capsule Take 2 capsules (200 mg total) by mouth at bedtime. 180 capsule 0   No current facility-administered medications on file prior to visit.    BP (!) 148/84   Pulse 82   Temp 97.8 F (36.6 C) (Temporal)   Ht 5\' 1"  (1.549 m)   Wt 121 lb (54.9 kg)   LMP 02/24/2018 (Approximate) Comment: spotting   SpO2 98%   BMI 22.86 kg/m  Objective:   Physical Exam Constitutional:      Appearance: She is not ill-appearing.  HENT:     Right Ear: Tympanic membrane and ear canal normal.     Left Ear: Tympanic membrane and ear canal normal.     Nose: No mucosal edema.     Right Sinus: No maxillary sinus tenderness or frontal sinus tenderness.     Left Sinus: No maxillary sinus tenderness or frontal sinus tenderness.     Mouth/Throat:     Mouth: Mucous membranes are moist.  Eyes:      Conjunctiva/sclera: Conjunctivae normal.  Cardiovascular:     Rate and Rhythm: Normal rate and regular rhythm.  Pulmonary:     Effort: Pulmonary effort is normal. No tachypnea.     Breath sounds: No decreased air movement. Examination of the right-upper field reveals rhonchi. Examination of the left-upper field reveals rhonchi. Examination of the left-lower field reveals rhonchi. Rhonchi present. No wheezing.     Comments: Congested cough noted throughout visit Musculoskeletal:     Cervical back: Neck supple.  Skin:    General: Skin is warm and dry.           Assessment & Plan:  Acute cough Assessment & Plan: Questionable lung sounds today.  Question community-acquired pneumonia.  Start Azithromycin . Take 2 tablets by mouth today, then 1 tablet daily for 4 additional days.  Chest x-ray ordered and pending.  Continue Mucinex followed by 8 ounces of water with each dose. Await results.  Orders: -     DG Chest 2 View -     Azithromycin; Take 2 tablets on day 1, then 1 tablet daily on days 2 through 5  Dispense: 6 tablet; Refill: 0        Doreene Nest, NP

## 2023-05-03 NOTE — Telephone Encounter (Signed)
Last office visit 05/03/2023 with PCP for acute cough.  Last refilled 04/10/2023 for #30 with 3 refills by Dr. Patsy Lager.  Next Appt: No future appointments with PCP or Dr. Patsy Lager.

## 2023-05-03 NOTE — Assessment & Plan Note (Signed)
Questionable lung sounds today.  Question community-acquired pneumonia.  Start Azithromycin . Take 2 tablets by mouth today, then 1 tablet daily for 4 additional days.  Chest x-ray ordered and pending.  Continue Mucinex followed by 8 ounces of water with each dose. Await results.

## 2023-06-13 ENCOUNTER — Ambulatory Visit: Payer: BC Managed Care – PPO | Admitting: Nurse Practitioner

## 2023-06-14 ENCOUNTER — Ambulatory Visit (INDEPENDENT_AMBULATORY_CARE_PROVIDER_SITE_OTHER): Payer: BC Managed Care – PPO | Admitting: Nurse Practitioner

## 2023-06-14 ENCOUNTER — Encounter: Payer: Self-pay | Admitting: Nurse Practitioner

## 2023-06-14 VITALS — BP 138/86 | HR 66 | Temp 98.3°F | Ht 61.0 in | Wt 124.6 lb

## 2023-06-14 DIAGNOSIS — K279 Peptic ulcer, site unspecified, unspecified as acute or chronic, without hemorrhage or perforation: Secondary | ICD-10-CM

## 2023-06-14 DIAGNOSIS — R1010 Upper abdominal pain, unspecified: Secondary | ICD-10-CM | POA: Insufficient documentation

## 2023-06-14 LAB — COMPREHENSIVE METABOLIC PANEL
ALT: 11 U/L (ref 0–35)
AST: 23 U/L (ref 0–37)
Albumin: 3.7 g/dL (ref 3.5–5.2)
Alkaline Phosphatase: 70 U/L (ref 39–117)
BUN: 11 mg/dL (ref 6–23)
CO2: 30 meq/L (ref 19–32)
Calcium: 9.7 mg/dL (ref 8.4–10.5)
Chloride: 102 meq/L (ref 96–112)
Creatinine, Ser: 0.61 mg/dL (ref 0.40–1.20)
GFR: 102.55 mL/min (ref >60.00)
Glucose, Bld: 129 mg/dL — ABNORMAL HIGH (ref 70–99)
Potassium: 3.7 meq/L (ref 3.5–5.1)
Sodium: 140 meq/L (ref 135–145)
Total Bilirubin: 0.9 mg/dL (ref 0.2–1.2)
Total Protein: 6.1 g/dL (ref 6.0–8.3)

## 2023-06-14 LAB — CBC
HCT: 40.5 % (ref 36.0–46.0)
Hemoglobin: 14.1 g/dL (ref 12.0–15.0)
MCHC: 34.8 g/dL (ref 30.0–36.0)
MCV: 112 fL — ABNORMAL HIGH (ref 78.0–100.0)
Platelets: 122 10*3/uL — ABNORMAL LOW (ref 150.0–400.0)
RBC: 3.61 Mil/uL — ABNORMAL LOW (ref 3.87–5.11)
RDW: 12.6 % (ref 11.5–15.5)
WBC: 6.2 10*3/uL (ref 4.0–10.5)

## 2023-06-14 LAB — LIPASE: Lipase: 17 U/L (ref 11.0–59.0)

## 2023-06-14 MED ORDER — SUCRALFATE 1 G PO TABS: 1.0000 g | ORAL_TABLET | Freq: Three times a day (TID) | ORAL | 0 refills | Status: DC

## 2023-06-14 MED ORDER — PANTOPRAZOLE SODIUM 40 MG PO TBEC: 40.0000 mg | DELAYED_RELEASE_TABLET | Freq: Every day | ORAL | 0 refills | Status: DC

## 2023-06-14 NOTE — Progress Notes (Signed)
Acute Office Visit  Subjective:     Patient ID: Maria [name], female    DOB: [birth year], 52 y.o.   MRN: [id number]  Chief Complaint  Patient presents with   stomach ulcer    Pt complains of feeling like her stomach ulcer is back. States that symptoms started right before thanksgiving. Slight nausea and vomiting. Complains it worsens when eating.  Pt states that her stomach has some pain. Today is level 5 pain. She says she feels pain in the same area she last had an ulcer.     HPI Patient is in today for abdominal pain history of stomach ulcers, pancreatitis, AKI, thrombocytopenia, alcohol use disorder, B1 deficiency.  Patient complains of abdominal pain that started around 4 Thanksgiving. States that she felt like she ate something that might have irritated it. States she may have over indulged and it did taper off. States that as of late she is having trouble with meals. States that she will have pain after eating. States that she has not vomitied liek she has before. Statest that she has vomitied twice  both in the morining States that she does smoke but did have regular alcohol use with 2-4 beers a week. States that she has cut back on her alcohol use and states it used to be more  Review of Systems  Constitutional:  Negative for chills and fever.  Respiratory:  Negative for shortness of breath.   Cardiovascular:  Negative for chest pain.  Gastrointestinal:  Positive for abdominal pain, nausea and vomiting. Negative for constipation and diarrhea.       BM daily   Neurological:  Negative for headaches.        Objective:    BP 138/86   Pulse 66   Temp 98.3 F (36.8 C) (Oral)   Ht 5\' 1"  (1.549 m)   Wt 124 lb 9.6 oz (56.5 kg)   LMP 02/24/2018 (Approximate) Comment: spotting   SpO2 99%   BMI 23.54 kg/m    Physical Exam Vitals and nursing note reviewed.  Constitutional:      Appearance: Normal appearance.  Cardiovascular:     Rate and Rhythm: Normal rate and  regular rhythm.     Heart sounds: Normal heart sounds.  Pulmonary:     Effort: Pulmonary effort is normal.     Breath sounds: Normal breath sounds.  Abdominal:     General: Bowel sounds are normal. There is no distension.     Palpations: There is no mass.     Tenderness: There is abdominal tenderness.     Hernia: No hernia is present.    Neurological:     Mental Status: She is alert.     No results found for any visits on 06/14/23.      Assessment & Plan:   Problem List Items Addressed This Visit       Digestive   PUD (peptic ulcer disease) - Primary   History of the same.  Will treat patient with Carafate 1 g 4 times daily for 7 days and Protonix 40 mg daily for 30 days anticipate to titrate down to Protonix 20 mg for 30 days thereafter and then discontinued PPI altogether.  Encourage patient to abstain from alcohol and NSAID use      Relevant Medications   sucralfate (CARAFATE) 1 g tablet   pantoprazole (PROTONIX) 40 MG tablet   Other Relevant Orders   H. pylori breath test     Other   Upper abdominal  pain   Will check labs inclusive of CBC, CMP, lipase.  Patient does have a history of chronic pancreatitis per the chart.  Start Protonix and Carafate pending H. pylori test also.      Relevant Medications   sucralfate (CARAFATE) 1 g tablet   pantoprazole (PROTONIX) 40 MG tablet   Other Relevant Orders   CBC   Comprehensive metabolic panel   Lipase   H. pylori breath test    Meds ordered this encounter  Medications   sucralfate (CARAFATE) 1 g tablet    Sig: Take 1 tablet (1 g total) by mouth 4 (four) times daily -  with meals and at bedtime for 7 days.    Dispense:  28 tablet    Refill:  0    Supervising Provider:   Roxy Manns A [[id number]]   pantoprazole (PROTONIX) 40 MG tablet    Sig: Take 1 tablet (40 mg total) by mouth daily.    Dispense:  30 tablet    Refill:  0    Supervising Provider:   Roxy Manns A [[id number]]    Return if symptoms worsen or fail to  improve.  Audria Nine, NP

## 2023-06-14 NOTE — Assessment & Plan Note (Signed)
Will check labs inclusive of CBC, CMP, lipase.  Patient does have a history of chronic pancreatitis per the chart.  Start Protonix and Carafate pending H. pylori test also.

## 2023-06-14 NOTE — Patient Instructions (Signed)
Nice to see you today Avoid alcohol and NSAIDs (ibuprofen, aleve, naproxen BC/Goody powders) Follow up if you do not improve I will be in touch with the labs once I have reviewed them

## 2023-06-14 NOTE — Assessment & Plan Note (Signed)
History of the same.  Will treat patient with Carafate 1 g 4 times daily for 7 days and Protonix 40 mg daily for 30 days anticipate to titrate down to Protonix 20 mg for 30 days thereafter and then discontinued PPI altogether.  Encourage patient to abstain from alcohol and NSAID use

## 2023-06-19 LAB — H. PYLORI BREATH TEST: H. pylori Breath Test: NOT DETECTED

## 2023-06-28 ENCOUNTER — Telehealth: Payer: Self-pay

## 2023-06-28 NOTE — Telephone Encounter (Signed)
Copied from CRM [id number]. Topic: Clinical - Medication Question >> Jun 27, 2023  4:03 PM Cordelia Pen B wrote: Reason for CRM: PATIENT CALLED IN STATING THE MEDICATION SHE HAS BEEN TAKING IS NOT HELPING HER ULCERS AND SHE WANTS TO KNOW IF SOMETHING ELSE CAN BE CALLED IN AND IS REQUESTING A CALL BACK FROM [employer] NURSE

## 2023-06-28 NOTE — Telephone Encounter (Signed)
Unable to reach patient. Left voicemail to return call to our office.   

## 2023-07-01 NOTE — Telephone Encounter (Signed)
Unable to reach patient. Left voicemail to return call to our office.   

## 2023-07-02 NOTE — Telephone Encounter (Signed)
Called patient left message to call office. Sending my chart to call as well.

## 2023-07-06 ENCOUNTER — Other Ambulatory Visit: Payer: Self-pay | Admitting: Nurse Practitioner

## 2023-07-06 DIAGNOSIS — R1010 Upper abdominal pain, unspecified: Secondary | ICD-10-CM

## 2023-07-06 DIAGNOSIS — K279 Peptic ulcer, site unspecified, unspecified as acute or chronic, without hemorrhage or perforation: Secondary | ICD-10-CM

## 2023-07-08 ENCOUNTER — Encounter: Payer: Self-pay | Admitting: Family Medicine

## 2023-07-08 ENCOUNTER — Telehealth: Payer: Self-pay

## 2023-07-08 ENCOUNTER — Ambulatory Visit: Payer: BC Managed Care – PPO | Admitting: Family Medicine

## 2023-07-08 VITALS — BP 162/88 | HR 71 | Temp 98.5°F | Ht 61.0 in | Wt 126.2 lb

## 2023-07-08 DIAGNOSIS — J069 Acute upper respiratory infection, unspecified: Secondary | ICD-10-CM

## 2023-07-08 DIAGNOSIS — R051 Acute cough: Secondary | ICD-10-CM

## 2023-07-08 LAB — POC COVID19 BINAXNOW: SARS Coronavirus 2 Ag: NEGATIVE

## 2023-07-08 LAB — POC INFLUENZA A&B (BINAX/QUICKVUE)
Influenza A, POC: NEGATIVE
Influenza B, POC: NEGATIVE

## 2023-07-08 NOTE — Telephone Encounter (Signed)
 Marland Kitchen

## 2023-07-08 NOTE — Telephone Encounter (Signed)
 I spoke with pt and for about 3 days pt has prod cough with green phlegm;? Fever; no covid test done. Pt scheduled appt with Dr Patsy Lager 07/08/23 at 121:20. Sending note to Dr Patsy Lager as Lorain Childes.

## 2023-07-08 NOTE — Progress Notes (Signed)
 Maria [name] T. Taytum Wheller, MD, CAQ Sports Medicine [facility] at [facility] [address] [place], [address]  Phone: [phone]  FAX: [phone]  Maria [name] - 53 y.o. female  MRN [id number]  Date of Birth: [birth year]  Date: 07/08/2023  PCP: Gretta Comer POUR, NP  Referral: Gretta Comer POUR, NP  Chief Complaint  Patient presents with   Nasal Congestion        Fatigue   Cough    Productive with clear green phlegm   Subjective:   Maria [name] is a 53 y.o. very pleasant female patient with Body mass index is 23.85 kg/m. who presents with the following:  She is a very pleasant lady who I remember well over the years, she presents with some acute nasal congestion, rhinorrhea, and a cough that is productive of clear sputum.  She generally does not feel well and she feels quite fatigued.  Does feel flu like.  Maybe a fever yesterday. Sinus congestion and going into her chest. Prod some and stuffed up a lot.  Nauseated and has a history of ulcer No aching and weak  Smokes about 1 - 1 1/2 packs for 20 years.   Review of Systems is noted in the HPI, as appropriate  Objective:   BP (!) 162/88 (BP Location: Left Arm, Patient Position: Sitting, Cuff Size: Normal)   Pulse 71   Temp 98.5 F (36.9 C) (Temporal)   Ht 5' 1 (1.549 m)   Wt 126 lb 4 oz (57.3 kg)   LMP 02/24/2018 (Approximate) Comment: spotting   SpO2 99%   BMI 23.85 kg/m    Gen: WDWN, NAD. Globally Non-toxic HEENT: Throat clear, w/o exudate, R TM clear, L TM - good landmarks, No fluid present. rhinnorhea.  MMM Frontal sinuses: NT Max sinuses: NT NECK: Anterior cervical  LAD is absent CV: RRR, No M/G/R, cap refill <2 sec PULM: Breathing comfortably in no respiratory distress. no wheezing, crackles, rhonchi   Laboratory and Imaging Data: Results for orders placed or performed in visit on 07/08/23  POC COVID-19   Collection Time: 07/08/23 11:47 AM  Result Value Ref Range    SARS Coronavirus 2 Ag Negative Negative  POC Influenza A&B (Binax test)   Collection Time: 07/08/23 11:48 AM  Result Value Ref Range   Influenza A, POC Negative Negative   Influenza B, POC Negative Negative     Assessment and Plan:     ICD-10-CM   1. Viral URI  J06.9     2. Acute cough  R05.1 POC Influenza A&B (Binax test)    POC COVID-19     Acute viral URI.  No COVID and flu.  RSV cannot be excluded, but this along with other viral syndromes would be treated expectantly.  Continue with supportive care, anticipate she will continue to improve slowly.  Orders placed today for conditions managed today: Orders Placed This Encounter  Procedures   POC Influenza A&B (Binax test)   POC COVID-19    Disposition: No follow-ups on file.  [employer] One speech-to-text software was used for transcription in this dictation.  Possible transcriptional errors can occur using Animal nutritionist.   Signed,  Jacques DASEN. Ameerah Huffstetler, MD   Outpatient Encounter Medications as of 07/08/2023  Medication Sig   cyclobenzaprine  (FLEXERIL ) 5 MG tablet TAKE 1 TABLET BY MOUTH 2 TIMES DAILY AS NEEDED FOR MUSCLE SPASMS.   EPINEPHrine  (EPIPEN  2-PAK) 0.3 mg/0.3 mL IJ SOAJ injection Inject 0.3 mg into the muscle  as needed for anaphylaxis.   fluticasone  (FLONASE ) 50 MCG/ACT nasal spray PLACE 1 SPRAY INTO BOTH NOSTRILS 2 (TWO) TIMES DAILY   gabapentin  (NEURONTIN ) 100 MG capsule Take 2 capsules (200 mg total) by mouth at bedtime.   pantoprazole  (PROTONIX ) 40 MG tablet Take 1 tablet (40 mg total) by mouth daily.   sucralfate  (CARAFATE ) 1 g tablet Take 1 tablet (1 g total) by mouth 4 (four) times daily -  with meals and at bedtime for 7 days.   No facility-administered encounter medications on file as of 07/08/2023.

## 2023-07-10 ENCOUNTER — Ambulatory Visit: Payer: BC Managed Care – PPO | Admitting: Family Medicine

## 2023-07-18 ENCOUNTER — Ambulatory Visit: Payer: Self-pay | Admitting: Primary Care

## 2023-07-18 ENCOUNTER — Encounter: Payer: Self-pay | Admitting: Gastroenterology

## 2023-07-18 NOTE — Telephone Encounter (Signed)
Copied from CRM [id number]. Topic: Clinical - Red Word Triage >> Jul 18, 2023  2:16 PM Denese Killings wrote: [facility] that prompted transfer to Nurse Triage: Patient is having pain from her ulcers. She said it is painful in the stomach and she feels nauseous with dark stool.  Chief Complaint: abd pain Symptoms: worsening abd pain, back pain,tender Frequency: couple of weeks Pertinent Negatives: Patient denies n/v Disposition: [] ED /[] Urgent Care (no appt availability in office) / [x] Appointment(In office/virtual)/ []  [facility]/ [] Home Care/ [] Refused Recommended Disposition /[] [facility]/ []  Follow-up with PCP Additional Notes: pt mentioned possible ulcer  Reason for Disposition  [1] MODERATE pain (e.g., interferes with normal activities) AND [2] pain comes and goes (cramps) AND [3] present > 24 hours  (Exception: Pain with Vomiting or Diarrhea - see that Guideline.)  Answer Assessment - Initial Assessment Questions 1. LOCATION: "Where does it hurt?"      Pain in middle of rib cage,  tender and middle back - near bra line 2. RADIATION: "Does the pain shoot anywhere else?" (e.g., chest, back)     N/a 3. ONSET: "When did the pain begin?" (e.g., minutes, hours or days ago)      Couple weeks but has gotten worse 4. SUDDEN: "Gradual or sudden onset?"     Gradual  5. PATTERN "Does the pain come and go, or is it constant?"    - If it comes and goes: "How long does it last?" "Do you have pain now?"     (Note: Comes and goes means the pain is intermittent. It goes away completely between bouts.)    - If constant: "Is it getting better, staying the same, or getting worse?"      (Note: Constant means the pain never goes away completely; most serious pain is constant and gets worse.)      Constant 6. SEVERITY: "How bad is the pain?"  (e.g., Scale 1-10; mild, moderate, or severe)    - MILD (1-3): Doesn't interfere with normal activities, abdomen soft and not tender to touch.      - MODERATE (4-7): Interferes with normal activities or awakens from sleep, abdomen tender to touch.     - SEVERE (8-10): Excruciating pain, doubled over, unable to do any normal activities.       Started off 4/10 and gotten worse: now at 7/10 7. RECURRENT SYMPTOM: "Have you ever had this type of stomach pain before?" If Yes, ask: "When was the last time?" and "What happened that time?"      Yes - ruptured ulcer in the past 8. CAUSE: "What do you think is causing the stomach pain?"     N/a 9. RELIEVING/AGGRAVATING FACTORS: "What makes it better or worse?" (e.g., antacids, bending or twisting motion, bowel movement)     Eating makes worse - history ruptured ulcer 10. OTHER SYMPTOMS: "Do you have any other symptoms?" (e.g., back pain, diarrhea, fever, urination pain, vomiting)       Nausea, dark stool  11. PREGNANCY: "Is there any chance you are pregnant?" "When was your last menstrual period?"       N/a  Protocols used: Abdominal Pain - [facility]

## 2023-07-18 NOTE — Telephone Encounter (Signed)
Noted, will evaluate. 

## 2023-07-19 ENCOUNTER — Ambulatory Visit: Payer: BC Managed Care – PPO | Admitting: Primary Care

## 2023-07-19 ENCOUNTER — Encounter: Payer: Self-pay | Admitting: Primary Care

## 2023-07-19 VITALS — BP 136/78 | HR 76 | Temp 98.4°F | Ht 61.0 in | Wt 118.0 lb

## 2023-07-19 DIAGNOSIS — R1010 Upper abdominal pain, unspecified: Secondary | ICD-10-CM

## 2023-07-19 DIAGNOSIS — K279 Peptic ulcer, site unspecified, unspecified as acute or chronic, without hemorrhage or perforation: Secondary | ICD-10-CM

## 2023-07-19 LAB — CBC WITH DIFFERENTIAL/PLATELET
Basophils Absolute: 0.1 10*3/uL (ref 0.0–0.1)
Basophils Relative: 1.4 % (ref 0.0–3.0)
Eosinophils Absolute: 0.1 10*3/uL (ref 0.0–0.7)
Eosinophils Relative: 1.1 % (ref 0.0–5.0)
HCT: 39.7 % (ref 36.0–46.0)
Hemoglobin: 13.5 g/dL (ref 12.0–15.0)
Lymphocytes Relative: 24.1 % (ref 12.0–46.0)
Lymphs Abs: 1.8 10*3/uL (ref 0.7–4.0)
MCHC: 34.1 g/dL (ref 30.0–36.0)
MCV: 108.2 fL — ABNORMAL HIGH (ref 78.0–100.0)
Monocytes Absolute: 0.7 10*3/uL (ref 0.1–1.0)
Monocytes Relative: 9.2 % (ref 3.0–12.0)
Neutro Abs: 4.8 10*3/uL (ref 1.4–7.7)
Neutrophils Relative %: 64.2 % (ref 43.0–77.0)
Platelets: 186 10*3/uL (ref 150.0–400.0)
RBC: 3.67 Mil/uL — ABNORMAL LOW (ref 3.87–5.11)
RDW: 12.2 % (ref 11.5–15.5)
WBC: 7.5 10*3/uL (ref 4.0–10.5)

## 2023-07-19 LAB — COMPREHENSIVE METABOLIC PANEL
ALT: 9 U/L (ref 0–35)
AST: 20 U/L (ref 0–37)
Albumin: 4.3 g/dL (ref 3.5–5.2)
Alkaline Phosphatase: 100 U/L (ref 39–117)
BUN: 21 mg/dL (ref 6–23)
CO2: 26 meq/L (ref 19–32)
Calcium: 9.8 mg/dL (ref 8.4–10.5)
Chloride: 102 meq/L (ref 96–112)
Creatinine, Ser: 0.83 mg/dL (ref 0.40–1.20)
GFR: 80.81 mL/min (ref >60.00)
Glucose, Bld: 109 mg/dL — ABNORMAL HIGH (ref 70–99)
Potassium: 3.2 meq/L — ABNORMAL LOW (ref 3.5–5.1)
Sodium: 137 meq/L (ref 135–145)
Total Bilirubin: 1 mg/dL (ref 0.2–1.2)
Total Protein: 7.5 g/dL (ref 6.0–8.3)

## 2023-07-19 LAB — HEMOCCULT GUIAC POC 1CARD (OFFICE): Fecal Occult Blood, POC: NEGATIVE

## 2023-07-19 LAB — LIPASE: Lipase: 13 U/L (ref 11.0–59.0)

## 2023-07-19 MED ORDER — SUCRALFATE 1 G PO TABS: 1.0000 g | ORAL_TABLET | Freq: Three times a day (TID) | ORAL | 0 refills | Status: DC

## 2023-07-19 MED ORDER — PANTOPRAZOLE SODIUM 40 MG PO TBEC: 40.0000 mg | DELAYED_RELEASE_TABLET | Freq: Every day | ORAL | 0 refills | Status: DC

## 2023-07-19 NOTE — Assessment & Plan Note (Addendum)
Symptoms suggestive.  Interestingly, she does not recall if she took pantoprazole or Carafate 1 month ago. Resume pantoprazole 40 mg daily. Start Carafate 4 times daily with meals x 14 days.  Checking labs today including CBC with differential, CMP, lipase, H. pylori breath test. Hemoccult card today negative.  Avoid alcohol, spicy/irritative foods.  Continue bland diet. She will update next week.

## 2023-07-19 NOTE — Patient Instructions (Addendum)
Stop by the lab prior to leaving today. I will notify you of your results once received.   Resume pantoprazole 40 mg daily.  Start Carafate 1 tablet 4 times daily with meals for 14 days.  Please update me early next week.  It was a pleasure to see you today!

## 2023-07-19 NOTE — Assessment & Plan Note (Signed)
Symptoms suggestive.  Interestingly, she does not recall if she took pantoprazole or Carafate 1 month ago.  Checking labs today including CBC with differential, CMP, lipase, H. pylori breath test. Hemoccult card today negative.  Avoid alcohol, spicy/irritative foods.  Continue bland diet. She will update next week.

## 2023-07-19 NOTE — Progress Notes (Signed)
Subjective:    Patient ID: Maria [name], female    DOB: 1971/03/16, 53 y.o.   MRN: [id number]  Abdominal Pain Associated symptoms include nausea and vomiting. Pertinent negatives include no constipation, diarrhea or fever.    Maria [name] is a very pleasant 53 y.o. female with a history of chronic pancreatitis, PUD, alcohol use disorder, who presents today to discuss abdominal pain.  Symptom onset about 4 weeks ago with epigastric pain. She has noticed intermittent nausea with vomiting after a few meals. These symptoms remind her of how she felt with her ulcer. Since then she's been eating a bland diet with soup and grilled protein, hasn't' noticed much improvement.   Her pain is constant for which she describes as a pressure. Her pain is worse within 30 minutes of eating. She has not had alcohol in 7 days.  Prior to this continuation of alcohol she would have anywhere from 1-3 alcoholic drinks daily.  Evaluated by a colleague on 06/14/2023 for the same symptoms, was prescribed pantoprazole 40 mg daily and Carafate 1 g 4 times daily with meals.  She believes she picked up the prescriptions and took the medication but is not sure.  She denies bloody stools, but she has noticed slightly darker stools. She denies constipation and diarrhea.   Review of Systems  Constitutional:  Positive for fatigue. Negative for chills and fever.  Gastrointestinal:  Positive for abdominal pain, nausea and vomiting. Negative for constipation and diarrhea.         Past Medical History:  Diagnosis Date   Acute on chronic blood loss anemia 11/02/2021   Acute postoperative anemia due to expected blood loss 09/19/2021   Anemia    Anesthesia complication, initial encounter 09/13/2021   Difficulty waking up from Anesthesia requiring re-intubation   Ankle edema, bilateral 11/15/2021   Anxiety    h/o   Depression    h/o   Family history of adverse reaction to anesthesia    mom-hard time waking up    Folate deficiency 07/30/2019   Gastritis 08/19/2020   GERD (gastroesophageal reflux disease)    tums prn   Hypertension    WAS PUT ON BP MED BY PCP LAST YEAR (2018) AND BP MED MADE PT SICK SO SHE STOPPED TAKING IT-PCP MONITORS BP NOW   Palpitations 05/18/2020   Pancreatitis    Peptic ulcer    Perforated peptic ulcer (HCC) 09/14/2021   Poison ivy dermatitis 11/13/2019   Upper GI bleed 04/11/2022   Vitamin B12 deficiency 01/17/2018   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    Social History   Socioeconomic History   Marital status: Married    Spouse name: Not on file   Number of children: Not on file   Years of education: Not on file   Highest education level: Some college, no degree  Occupational History   Not on file  Tobacco Use   Smoking status: Every Day    Current packs/day: 1.00    Average packs/day: 1 pack/day for 20.0 years (20.0 ttl pk-yrs)    Types: Cigarettes   Smokeless tobacco: Never  Vaping Use   Vaping status: Never Used  Substance and Sexual Activity   Alcohol use: Yes    Alcohol/week: 2.0 standard drinks of alcohol    Types: 2 Glasses of wine per week   Drug use: No   Sexual activity: Yes  Other Topics Concern   Not on file  Social History Narrative   Married.   1  child.    Working at [employer]	[place].     Enjoys swimming, camping   Social Drivers of Health   Financial Resource Strain: Low Risk  (06/13/2023)   Overall Financial Resource Strain (CARDIA)    Difficulty of Paying Living Expenses: Not hard at all  Food Insecurity: No Food Insecurity (06/13/2023)   Hunger Vital Sign    Worried About Running Out of Food in the Last Year: Never true    Ran Out of Food in the Last Year: Never true  Transportation Needs: No Transportation Needs (06/13/2023)   PRAPARE - Administrator, Civil Service (Medical): No    Lack of Transportation (Non-Medical): No  Physical Activity: Unknown (06/13/2023)   Exercise Vital Sign    Days of Exercise per Week:  5 days    Minutes of Exercise per Session: Patient declined  Stress: No Stress Concern Present (06/13/2023)   [employer] of Occupational Health - Occupational Stress Questionnaire    Feeling of Stress : Not at all  Social Connections: Moderately Isolated (06/13/2023)   Social Connection and Isolation Panel [NHANES]    Frequency of Communication with Friends and Family: More than three times a week    Frequency of Social Gatherings with Friends and Family: Twice a week    Attends Religious Services: Never    Database administrator or Organizations: No    Attends Engineer, structural: Not on file    Marital Status: Married  Catering manager Violence: Not At Risk (04/12/2022)   Humiliation, Afraid, Rape, and Kick questionnaire    Fear of Current or Ex-Partner: No    Emotionally Abused: No    Physically Abused: No    Sexually Abused: No    Past Surgical History:  Procedure Laterality Date   ANTERIOR AND POSTERIOR REPAIR WITH SACROSPINOUS FIXATION N/A 03/13/2018   Procedure: ANTERIOR REPAIR;  Surgeon: Nadara Mustard, MD;  Location: [facility];  Service: Gynecology;  Laterality: N/A;   ESOPHAGOGASTRODUODENOSCOPY (EGD) WITH PROPOFOL N/A 03/27/2022   Procedure: ESOPHAGOGASTRODUODENOSCOPY (EGD) WITH PROPOFOL;  Surgeon: Regis Bill, MD;  Location: [facility];  Service: Endoscopy;  Laterality: N/A;   ESOPHAGOGASTRODUODENOSCOPY (EGD) WITH PROPOFOL N/A 04/12/2022   Procedure: ESOPHAGOGASTRODUODENOSCOPY (EGD) WITH PROPOFOL;  Surgeon: Toney Reil, MD;  Location: [facility];  Service: Gastroenterology;  Laterality: N/A;   INDUCED ABORTION     x2   IR CM INJ ANY COLONIC TUBE W/FLUORO  09/29/2021   LAPAROTOMY N/A 09/13/2021   Procedure: EXPLORATORY LAPAROTOMY-Graham PATCH repair;  Surgeon: Sung Amabile, DO;  Location: [facility];  Service: General;  Laterality: N/A;   LAPAROTOMY N/A 09/17/2021   Procedure: EXPLORATORY LAPAROTOMY;  Surgeon: Henrene Dodge, MD;   Location: [facility];  Service: General;  Laterality: N/A;   REPAIR OF PERFORATED ULCER N/A 09/17/2021   Procedure: REPAIR OF PERFORATED ULCER;  Surgeon: Henrene Dodge, MD;  Location: [facility];  Service: General;  Laterality: N/A;   VAGINAL HYSTERECTOMY N/A 03/13/2018   Procedure: HYSTERECTOMY VAGINAL;  Surgeon: Nadara Mustard, MD;  Location: [facility];  Service: Gynecology;  Laterality: N/A;    Family History  Problem Relation Age of Onset   Arthritis Mother    Arthritis Father    Alcohol abuse Father    Hypertension Father    Heart disease Father    Aneurysm Father    Breast cancer Maternal Grandmother    Breast cancer Paternal Grandmother     Allergies  Allergen Reactions   Anesthesia S-I-40 [  Propofol]     Lethargic after med use   Bee Venom Swelling   Codeine Swelling   Bupropion Anxiety and Other (See Comments)    Worsened anxiety    Current Outpatient Medications on File Prior to Visit  Medication Sig Dispense Refill   cyclobenzaprine (FLEXERIL) 5 MG tablet TAKE 1 TABLET BY MOUTH 2 TIMES DAILY AS NEEDED FOR MUSCLE SPASMS. 30 tablet 3   EPINEPHrine (EPIPEN 2-PAK) 0.3 mg/0.3 mL IJ SOAJ injection Inject 0.3 mg into the muscle as needed for anaphylaxis. 2 each 0   fluticasone (FLONASE) 50 MCG/ACT nasal spray PLACE 1 SPRAY INTO BOTH NOSTRILS 2 (TWO) TIMES DAILY 48 mL 0   gabapentin (NEURONTIN) 100 MG capsule Take 2 capsules (200 mg total) by mouth at bedtime. 180 capsule 0   No current facility-administered medications on file prior to visit.    BP 136/78   Pulse 76   Temp 98.4 F (36.9 C) (Temporal)   Ht 5\' 1"  (1.549 m)   Wt 118 lb (53.5 kg)   LMP 02/24/2018 (Approximate) Comment: spotting   SpO2 99%   BMI 22.30 kg/m  Objective:   Physical Exam Cardiovascular:     Rate and Rhythm: Normal rate and regular rhythm.  Pulmonary:     Effort: Pulmonary effort is normal.     Breath sounds: Normal breath sounds.  Abdominal:     Palpations: Abdomen is soft.      Tenderness: There is abdominal tenderness in the epigastric area and left upper quadrant.  Genitourinary:    Rectum: Guaiac result negative.  Musculoskeletal:     Cervical back: Neck supple.  Skin:    General: Skin is warm and dry.  Neurological:     Mental Status: She is alert and oriented to person, place, and time.  Psychiatric:        Mood and Affect: Mood normal.           Assessment & Plan:  Upper abdominal pain Assessment & Plan: Symptoms suggestive.  Interestingly, she does not recall if she took pantoprazole or Carafate 1 month ago.  Checking labs today including CBC with differential, CMP, lipase, H. pylori breath test. Hemoccult card today negative.  Avoid alcohol, spicy/irritative foods.  Continue bland diet. She will update next week.  Orders: -     CBC with Differential/Platelet -     Lipase -     H. pylori breath test -     POCT occult blood stool -     Comprehensive metabolic panel -     Pantoprazole Sodium; Take 1 tablet (40 mg total) by mouth daily. For ulcer  Dispense: 90 tablet; Refill: 0 -     Sucralfate; Take 1 tablet (1 g total) by mouth 4 (four) times daily -  with meals and at bedtime for 14 days.  Dispense: 56 tablet; Refill: 0  PUD (peptic ulcer disease) Assessment & Plan: Symptoms suggestive.  Interestingly, she does not recall if she took pantoprazole or Carafate 1 month ago. Resume pantoprazole 40 mg daily. Start Carafate 4 times daily with meals x 14 days.  Checking labs today including CBC with differential, CMP, lipase, H. pylori breath test. Hemoccult card today negative.  Avoid alcohol, spicy/irritative foods.  Continue bland diet. She will update next week.  Orders: -     Pantoprazole Sodium; Take 1 tablet (40 mg total) by mouth daily. For ulcer  Dispense: 90 tablet; Refill: 0 -     Sucralfate; Take 1 tablet (1 g  total) by mouth 4 (four) times daily -  with meals and at bedtime for 14 days.  Dispense: 56 tablet; Refill:  0        Doreene Nest, NP

## 2023-07-20 ENCOUNTER — Other Ambulatory Visit: Payer: Self-pay | Admitting: Primary Care

## 2023-07-20 DIAGNOSIS — E876 Hypokalemia: Secondary | ICD-10-CM

## 2023-07-20 MED ORDER — POTASSIUM CHLORIDE CRYS ER 20 MEQ PO TBCR: 20.0000 meq | EXTENDED_RELEASE_TABLET | Freq: Two times a day (BID) | ORAL | 0 refills | Status: DC

## 2023-07-23 LAB — H. PYLORI BREATH TEST: H. pylori Breath Test: NOT DETECTED

## 2023-07-29 ENCOUNTER — Other Ambulatory Visit: Payer: Self-pay | Admitting: Primary Care

## 2023-07-29 DIAGNOSIS — H938X3 Other specified disorders of ear, bilateral: Secondary | ICD-10-CM

## 2023-08-12 NOTE — Progress Notes (Signed)
Chief Complaint: peptic ulcer, nausea and vomiting Primary GI Doctor: Dr. Adela Lank  HPI: Patient is a 53 year old female patient with past medical history of chronic pancreatitis,B 12 deficiency, peptic ulcer disease, depression, who was referred to me by Doreene Nest, NP on 04/12/23 for a complaint of peptic ulcer.   Interval History      Patient has history of peptic ulcer disease. Per the EGD reports in 2023 she was to stay on Prilosec 20 mg twice daily indefinitely. She admits she has not been on any antiacids for several months to reduce medication she was taking. She presents today with main complaint of nausea and vomiting. She reports she has had few issues if she eats certain types of food such as tomato sauce she will shortly after vomit the food up. She denies hematemesis.Denies dysphagia. She has had about 3 episodes over the course of the last 6 months. She has lost 6lbs in a month. She does admit she has poor appetite due to fear of symptoms. No dark tarry stools or blood in stool. She reports no abdominal pain or changes in bowel habits. She does take Aleve 2 tablets daily few times a week alternating wit Tylenol. Smokes 1.5 pack a day. Socially drinks, has stopped since recent issues. Never had colonoscopy. Patient's family history includes breast CA in mother and maternal grandmother. Admits to fatigue.  Wt Readings from Last 3 Encounters:  08/13/23 124 lb (56.2 kg)  07/19/23 118 lb (53.5 kg)  07/08/23 126 lb 4 oz (57.3 kg)   Past Medical History:  Diagnosis Date   Acute on chronic blood loss anemia 11/02/2021   Acute postoperative anemia due to expected blood loss 09/19/2021   Anemia    Anesthesia complication, initial encounter 09/13/2021   Difficulty waking up from Anesthesia requiring re-intubation   Ankle edema, bilateral 11/15/2021   Anxiety    h/o   Depression    h/o   Family history of adverse reaction to anesthesia    mom-hard time waking up   Folate  deficiency 07/30/2019   Gastritis 08/19/2020   GERD (gastroesophageal reflux disease)    tums prn   Hypertension    WAS PUT ON BP MED BY PCP LAST YEAR (2018) AND BP MED MADE PT SICK SO SHE STOPPED TAKING IT-PCP MONITORS BP NOW   Palpitations 05/18/2020   Pancreatitis    Peptic ulcer    Perforated peptic ulcer (HCC) 09/14/2021   Poison ivy dermatitis 11/13/2019   Upper GI bleed 04/11/2022   Vitamin B12 deficiency 01/17/2018   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    Past Surgical History:  Procedure Laterality Date   ANTERIOR AND POSTERIOR REPAIR WITH SACROSPINOUS FIXATION N/A 03/13/2018   Procedure: ANTERIOR REPAIR;  Surgeon: Nadara Mustard, MD;  Location: [facility];  Service: Gynecology;  Laterality: N/A;   ESOPHAGOGASTRODUODENOSCOPY (EGD) WITH PROPOFOL N/A 03/27/2022   Procedure: ESOPHAGOGASTRODUODENOSCOPY (EGD) WITH PROPOFOL;  Surgeon: Regis Bill, MD;  Location: [facility];  Service: Endoscopy;  Laterality: N/A;   ESOPHAGOGASTRODUODENOSCOPY (EGD) WITH PROPOFOL N/A 04/12/2022   Procedure: ESOPHAGOGASTRODUODENOSCOPY (EGD) WITH PROPOFOL;  Surgeon: Toney Reil, MD;  Location: [facility];  Service: Gastroenterology;  Laterality: N/A;   INDUCED ABORTION     x2   IR CM INJ ANY COLONIC TUBE W/FLUORO  09/29/2021   LAPAROTOMY N/A 09/13/2021   Procedure: EXPLORATORY LAPAROTOMY-Graham PATCH repair;  Surgeon: Sung Amabile, DO;  Location: [facility];  Service: General;  Laterality: N/A;   LAPAROTOMY  N/A 09/17/2021   Procedure: EXPLORATORY LAPAROTOMY;  Surgeon: Henrene Dodge, MD;  Location: [facility];  Service: General;  Laterality: N/A;   REPAIR OF PERFORATED ULCER N/A 09/17/2021   Procedure: REPAIR OF PERFORATED ULCER;  Surgeon: Henrene Dodge, MD;  Location: [facility];  Service: General;  Laterality: N/A;   VAGINAL HYSTERECTOMY N/A 03/13/2018   Procedure: HYSTERECTOMY VAGINAL;  Surgeon: Nadara Mustard, MD;  Location: [facility];  Service: Gynecology;  Laterality: N/A;     Current Outpatient Medications  Medication Sig Dispense Refill   cyclobenzaprine (FLEXERIL) 5 MG tablet TAKE 1 TABLET BY MOUTH 2 TIMES DAILY AS NEEDED FOR MUSCLE SPASMS. 30 tablet 3   EPINEPHrine (EPIPEN 2-PAK) 0.3 mg/0.3 mL IJ SOAJ injection Inject 0.3 mg into the muscle as needed for anaphylaxis. 2 each 0   fluticasone (FLONASE) 50 MCG/ACT nasal spray PLACE 1 SPRAY INTO BOTH NOSTRILS 2 (TWO) TIMES DAILY 48 mL 0   gabapentin (NEURONTIN) 100 MG capsule Take 2 capsules (200 mg total) by mouth at bedtime. 180 capsule 0   potassium chloride SA (KLOR-CON M) 20 MEQ tablet Take 1 tablet (20 mEq total) by mouth 2 (two) times daily. For low potassium. 10 tablet 0   No current facility-administered medications for this visit.    Allergies as of 08/13/2023 - Review Complete 08/13/2023  Allergen Reaction Noted   Anesthesia s-i-40 [propofol]  09/14/2021   Bee venom Swelling 03/05/2018   Codeine Swelling 12/20/2015   Bupropion Anxiety and Other (See Comments) 02/04/2014   \ Family History  Problem Relation Age of Onset   Arthritis Mother    Arthritis Father    Alcohol abuse Father    Hypertension Father    Heart disease Father    Aneurysm Father    Breast cancer Maternal Grandmother    Breast cancer Paternal Grandmother    Review of Systems:    Constitutional: No weight loss, fever, chills, weakness or fatigue HEENT: Eyes: No change in vision               Ears, Nose, Throat:  No change in hearing or congestion Skin: No rash or itching Cardiovascular: No chest pain, chest pressure or palpitations   Respiratory: No SOB or cough Gastrointestinal: See HPI and otherwise negative Genitourinary: No dysuria or change in urinary frequency Neurological: No headache, dizziness or syncope Musculoskeletal: No new muscle or joint pain Hematologic: No bleeding or bruising Psychiatric: No history of depression or anxiety   Physical Exam:  Vital signs: BP 132/68   Pulse (!) 52   Ht 5\' 2"   (1.575 m)   Wt 124 lb (56.2 kg)   LMP 02/24/2018 (Approximate) Comment: spotting   BMI 22.68 kg/m   Constitutional: Pleasant Caucasian female appears to be in NAD, Well developed, Well nourished, alert and cooperative Neck:  Supple Throat: Oral cavity and pharynx without inflammation, swelling or lesion.  Respiratory: Respirations even and unlabored. Lungs clear to auscultation bilaterally.   No wheezes, crackles, or rhonchi.  Cardiovascular: Normal S1, S2. Regular rate and rhythm. No peripheral edema, cyanosis or pallor.  Gastrointestinal:  Soft, nondistended, nontender. No rebound or guarding. Normal bowel sounds. No appreciable masses or hepatomegaly. Rectal:  Not performed.  Msk:  Symmetrical without gross deformities. Without edema, no deformity or joint abnormality.  Neurologic:  Alert and  oriented x4;  grossly normal neurologically.  Skin:   Dry and intact without significant lesions or rashes. Psychiatric: Oriented to person, place and time. Demonstrates good judgement and reason without abnormal  affect or behaviors.  RELEVANT LABS AND IMAGING: CBC    Latest Ref Rng & Units 07/19/2023   12:56 PM 06/14/2023    9:50 AM 01/10/2023   10:02 AM  CBC  WBC 4.0 - 10.5 K/uL 7.5  6.2  7.3   Hemoglobin 12.0 - 15.0 g/dL 95.2  84.1  32.4   Hematocrit 36.0 - 46.0 % 39.7  40.5  40.5   Platelets 150.0 - 400.0 K/uL 186.0  122.0  113.0      CMP     Latest Ref Rng & Units 07/19/2023   12:56 PM 06/14/2023    9:50 AM 01/10/2023   10:02 AM  CMP  Glucose 70 - 99 mg/dL 401  027  92   BUN 6 - 23 mg/dL 21  11  16    Creatinine 0.40 - 1.20 mg/dL 2.53  6.64  4.03   Sodium 135 - 145 mEq/L 137  140  139   Potassium 3.5 - 5.1 mEq/L 3.2  3.7  3.5   Chloride 96 - 112 mEq/L 102  102  108   CO2 19 - 32 mEq/L 26  30  25    Calcium 8.4 - 10.5 mg/dL 9.8  9.7  9.1   Total Protein 6.0 - 8.3 g/dL 7.5  6.1    Total Bilirubin 0.2 - 1.2 mg/dL 1.0  0.9    Alkaline Phos 39 - 117 U/L 100  70    AST 0 - 37 U/L  20  23    ALT 0 - 35 U/L 9  11      Lab Results  Component Value Date   TSH 1.37 05/17/2021  07/19/2023 H. pylori breath test negative 07/19/2023 labs show: Lipase 13, potassium 3.2, BUN 21, creatinine 0.83, normal LFTs, Hgb 13.5, MCV 108.2, plt 186  04/12/22  EGD with Dr. Lannette Donath Impression:  - Non- bleeding duodenal ulcer with a clean ulcer base ( Forrest Class III) .  - Normal second portion of the duodenum.  - Scar in the prepyloric region of the stomach.  - Normal gastric body, incisura and antrum.  - Widely patent Schatzki ring.  - Normal gastroesophageal junction and esophagus.  - Small hiatal hernia.  - No specimens collected. 03/27/22 EGD with Dr. Eather Colas Impression:  - LA Grade A esophagitis with no bleeding.  - Non- bleeding gastric ulcer with a flat pigmented spot ( Forrest Class IIc) . Biopsied.  - Erythematous duodenopathy. Path:  DIAGNOSIS:  A. STOMACH; COLD BIOPSY:  - OXYNTIC MUCOSA WITH CHANGES CONSISTENT WITH PROTON PUMP INHIBITOR  EFFECT AND NON-SPECIFIC SUPERFICIAL VASCULAR CONGESTION.  - NEGATIVE FOR H. PYLORI, INTESTINAL METAPLASIA, DYSPLASIA, AND  MALIGNANCY.   09/16/21 echo-Left ventricular ejection fraction, by estimation, is 50 to 55%.   Assessment: Encounter Diagnoses  Name Primary?   Nausea and vomiting, unspecified vomiting type Yes   PUD (peptic ulcer disease)    Vitamin B 12 deficiency    Vitamin D deficiency    Loss of weight      53 year old female patient with history of PUD who presents with main complaint of nausea, vomiting, and weight loss. She reports she has not been on any antiacids for several months. She also endorses using NSAID's several times a week. We will go ahead and schedule EGD to reevaluate for duodenal ulcer. She will restart her PPI twice daily and follow strict GERD diet. She has been instructed to stop NSAID's indefinitely. She will need to discuss with PCP alternative  therapies. Her labs also indicate  possible B12 deficiency will recheck her levels. She is experiencing fatigue today. Also has hx of vit d deficiency. Will check also. H. Pylori breath test completed recently and negative.   We discussed surveillance screening colonoscopy, due to nausea and vomiting she does not think she can tolerate prep, agrees to schedule later this year.  Plan: - Restart Prilosec 20 mg twice daily - Strict GERD diet, no late meals  -No NSAIDs indefinitely, stop Aleve -Check B12, folate levels, vitamin D today -schedule EGD in [facility] with Dr. Adela Lank. The risks and benefits of EGD with possible biopsies and esophageal dilation were discussed with the patient who agrees to proceed.  -Follow-up with me in 3 months.   Thank you for the courtesy of this consult. Please call me with any questions or concerns.   Murl Zogg, FNP-C [facility] 08/13/2023, 11:33 AM  Cc: Doreene Nest, NP

## 2023-08-13 ENCOUNTER — Other Ambulatory Visit (INDEPENDENT_AMBULATORY_CARE_PROVIDER_SITE_OTHER): Payer: BC Managed Care – PPO

## 2023-08-13 ENCOUNTER — Encounter: Payer: Self-pay | Admitting: Gastroenterology

## 2023-08-13 ENCOUNTER — Ambulatory Visit: Payer: BC Managed Care – PPO | Admitting: Gastroenterology

## 2023-08-13 VITALS — BP 132/68 | HR 52 | Ht 62.0 in | Wt 124.0 lb

## 2023-08-13 DIAGNOSIS — K279 Peptic ulcer, site unspecified, unspecified as acute or chronic, without hemorrhage or perforation: Secondary | ICD-10-CM

## 2023-08-13 DIAGNOSIS — R634 Abnormal weight loss: Secondary | ICD-10-CM

## 2023-08-13 DIAGNOSIS — R112 Nausea with vomiting, unspecified: Secondary | ICD-10-CM

## 2023-08-13 DIAGNOSIS — E559 Vitamin D deficiency, unspecified: Secondary | ICD-10-CM

## 2023-08-13 DIAGNOSIS — E538 Deficiency of other specified B group vitamins: Secondary | ICD-10-CM | POA: Diagnosis not present

## 2023-08-13 LAB — VITAMIN D 25 HYDROXY (VIT D DEFICIENCY, FRACTURES): VITD: 16.51 ng/mL — ABNORMAL LOW (ref 30.00–100.00)

## 2023-08-13 LAB — VITAMIN B12: Vitamin B-12: 741 pg/mL (ref 211–911)

## 2023-08-13 NOTE — Progress Notes (Signed)
Agree with assessment and plan as outlined.

## 2023-08-13 NOTE — Patient Instructions (Signed)
Your provider has requested that you go to the basement level for lab work before leaving today. Press "B" on the elevator. The lab is located at the first door on the left as you exit the elevator.  RESTART Prilosec 20 mg twice daily.   Please avoid all NSAID's. Some examples of NSAID's are as follows: Aspirin (Bufferin, Bayer, and Excedrin) Ibuprofen (Advil, Motrin, Nuprin) Ketoprofen (Actron, Orudis) Naproxen (Aleve) Daypro  Indocin  Lodine  Naprosyn  Relafen  Vimovo Voltaren  You have been scheduled for an endoscopy. Please follow written instructions given to you at your visit today.  If you use inhalers (even only as needed), please bring them with you on the day of your procedure.  If you take any of the following medications, they will need to be adjusted prior to your procedure:   DO NOT TAKE 7 DAYS PRIOR TO TEST- Trulicity (dulaglutide) Ozempic, Wegovy (semaglutide) Mounjaro (tirzepatide) Bydureon Bcise (exanatide extended release)  DO NOT TAKE 1 DAY PRIOR TO YOUR TEST Rybelsus (semaglutide) Adlyxin (lixisenatide) Victoza (liraglutide) Byetta (exanatide) ___________________________________________________________________________   GERD in Adults: Diet Changes When you have gastroesophageal reflux disease (GERD), you may need to make changes to your diet. Choosing the right foods can help with your symptoms. Think about working with an expert in healthy eating called a dietitian. They can help you make healthy food choices. What are tips for following this plan? Reading food labels Look for foods that are low in saturated fat. Foods that may help with your symptoms include: Foods with less than 5% of daily value (DV) of fat. Foods with 0 grams of trans fat. Cooking [employer] in ways that don't use a lot of fat. These ways include: Baking. Steaming. Grilling. Broiling. To add flavor, try to use herbs that are low in spice and acidity. Avoid frying your  food. Meal planning  Eat small meals often rather than eating 3 large meals each day. Eat your meals slowly in a place where you feel relaxed. If told by your health care provider, avoid: Foods that cause symptoms. Keep a food diary to keep track of foods that cause symptoms. Alcohol. Drinking a lot of liquid with meals. General instructions For 2-3 hours after you eat, avoid: Bending over. Exercise. Lying down. Chew sugar-free gum after meals. What foods should I eat? Eat a healthy diet. Try to include: Foods with high amounts of fiber. These include: Fruits and vegetables. Whole grains and beans. Low-fat dairy products. Lean meats, fish, and poultry. Egg whites. Foods that cause symptoms in someone else may not cause symptoms for you. Work with your provider to find foods that are safe for you. The items listed above may not be all the foods and drinks you can have. Talk with a dietitian to learn more. The items listed above may not be a complete list of foods and beverages you can eat and drink. Contact a dietitian for more information. What foods should I avoid? Limiting some of these foods may help with your symptoms. Each person is different. Talk with a dietitian or your provider to help you find the exact foods to avoid. Some of the foods to avoid may include: Fruits Fruits with a lot of acid in them. These may include citrus fruits, such as oranges, grapefruit, pineapple, and lemons. Vegetables Deep-fried vegetables, such as [place] fries. Vegetables, sauces, or toppings made with added fat and vegetables with acid in them. These may include tomatoes and tomato products, chili peppers, onions,  garlic, and horseradish. Grains Pastries or quick breads with added fat. Meats and other proteins High-fat meats, such as fatty beef or pork, hot dogs, ribs, ham, sausage, salami, and bacon. Fried meat or protein, such as fried fish and fried chicken. Egg yolks. Fats and  oils Butter. Margarine. Shortening. Ghee. Drinks Coffee and other drinks with caffeine in them. Fizzy and sugary drinks, such as soda and energy drinks. Fruit juice made with acidic fruits, such as orange or grapefruit. Tomato juice. Sweets and desserts Chocolate and cocoa. Donuts. Seasonings and condiments Mint, such as peppermint and spearmint. Condiments, herbs, or seasonings that cause symptoms. These may include curry, hot sauce, or vinegar-based salad dressings. The items listed above may not be all the foods and drinks you should avoid. Talk with a dietitian to learn more. Questions to ask your health care provider Changes to your diet and everyday life are often the first steps taken to manage symptoms of GERD. If these changes don't help, talk with your provider about taking medicines. Where to find more information International Foundation for Gastrointestinal Disorders: aboutgerd.org This information is not intended to replace advice given to you by your health care provider. Make sure you discuss any questions you have with your health care provider. Document Revised: 04/30/2023 Document Reviewed: 11/14/2022 Elsevier Patient Education  2024 [employer].

## 2023-08-15 ENCOUNTER — Encounter: Payer: Self-pay | Admitting: Family Medicine

## 2023-08-16 NOTE — Telephone Encounter (Signed)
  Please schedule patient an appointment with Dr. Patsy Lager.

## 2023-09-03 ENCOUNTER — Ambulatory Visit: Payer: Self-pay | Admitting: Primary Care

## 2023-09-03 NOTE — Telephone Encounter (Signed)
 FYI patients scheduled with Dr. Ermalene Searing on 09/05/23

## 2023-09-03 NOTE — Telephone Encounter (Signed)
 Chief Complaint: Left shoulder/back pain Symptoms: Pain Frequency: Intermittent, depends on what pt is doing Pertinent Negatives: Patient denies neck pain, numbness, swelling, weakness, pain radiation Disposition: [] ED /[] Urgent Care (no appt availability in office) / [x] Appointment(In office/virtual)/ []  [facility]/ [] Home Care/ [] Refused Recommended Disposition /[] [place] [facility]/ []  Follow-up with PCP Additional Notes: Pt states she has had ongoing left shoulder blade/back pain for a while. Pt states the pain worsens with movement. Pt is not currently in any pain. Pt states she has an appt on 3/7 but would like to be seen sooner if available. Pt does not want to go to another office or urgent care. Pt scheduled for first available appointment at PCP office with a different provider for Thursday, 3/6. This RN educated pt on home care, new-worsening symptoms, when to call back/seek emergent care. Pt verbalized understanding and agrees to plan.     Copied from CRM [id number]. Topic: Clinical - Red Word Triage >> Sep 03, 2023  4:15 PM Tiffany H wrote: Patient called to schedule a sooner appointment with Dr. Christean Grief. Patient is scheduled to be seen 09/06/23. Patient advised that she has been having back pain that has progressively worsened over the past couple of months.   Please assist. Reason for Disposition  Shoulder pain is a chronic symptom (recurrent or ongoing AND present > 4 weeks)  Answer Assessment - Initial Assessment Questions 1. ONSET: "When did the pain start?"     A long time, eased up, last 2-3 weeks it worsened 2. LOCATION: "Where is the pain located?"     Left shoulder blade 3. PAIN: "How bad is the pain?" (Scale 1-10; or mild, moderate, severe)   - MILD (1-3): doesn't interfere with normal activities   - MODERATE (4-7): interferes with normal activities (e.g., work or school) or awakens from sleep   - SEVERE (8-10): excruciating pain, unable to  do any normal activities, unable to move arm at all due to pain     8/10 4. 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 OTHER SYMPTOMS: "Do you have any other symptoms?" (e.g., neck pain, swelling, rash, fever, numbness, weakness)     Denies  Protocols used: Shoulder Pain-A-AH

## 2023-09-03 NOTE — Telephone Encounter (Signed)
 Noted and appreciate Dr. Daphine Deutscher evaluation.

## 2023-09-04 DIAGNOSIS — M62830 Muscle spasm of back: Secondary | ICD-10-CM | POA: Diagnosis not present

## 2023-09-04 DIAGNOSIS — R82998 Other abnormal findings in urine: Secondary | ICD-10-CM | POA: Diagnosis not present

## 2023-09-05 ENCOUNTER — Ambulatory Visit: Admitting: Family Medicine

## 2023-09-06 ENCOUNTER — Encounter: Payer: Self-pay | Admitting: Primary Care

## 2023-09-06 ENCOUNTER — Ambulatory Visit: Admitting: Family Medicine

## 2023-09-17 ENCOUNTER — Telehealth: Payer: Self-pay | Admitting: Gastroenterology

## 2023-09-17 ENCOUNTER — Encounter: Payer: BC Managed Care – PPO | Admitting: Gastroenterology

## 2023-09-17 NOTE — Telephone Encounter (Signed)
 Good Morning Dr. Adela Lank,  I called this patient at 9:22 am she stated she had to work and is still at  work. She will call back to reschedule  I will NO SHOW her    [employer]

## 2023-09-17 NOTE — Telephone Encounter (Signed)
 Sorry to hear this, thank you for letting us know. She did not call us to let us know she would not be making it.

## 2023-10-09 ENCOUNTER — Ambulatory Visit: Admitting: Primary Care

## 2023-10-09 ENCOUNTER — Ambulatory Visit: Admitting: Internal Medicine

## 2023-10-09 ENCOUNTER — Encounter: Payer: Self-pay | Admitting: Internal Medicine

## 2023-10-09 ENCOUNTER — Ambulatory Visit: Payer: Self-pay

## 2023-10-09 VITALS — BP 136/76 | HR 64 | Temp 98.4°F | Ht 62.0 in | Wt 122.0 lb

## 2023-10-09 DIAGNOSIS — G8929 Other chronic pain: Secondary | ICD-10-CM

## 2023-10-09 DIAGNOSIS — G5603 Carpal tunnel syndrome, bilateral upper limbs: Secondary | ICD-10-CM | POA: Diagnosis not present

## 2023-10-09 DIAGNOSIS — M546 Pain in thoracic spine: Secondary | ICD-10-CM | POA: Diagnosis not present

## 2023-10-09 NOTE — Assessment & Plan Note (Signed)
 Mechanical based on work requirements Discussed making sure her back is straight when folding linens, etc Can try lidocaine patch  Tylenol prn Heat

## 2023-10-09 NOTE — Progress Notes (Signed)
 Subjective:    Patient ID: Maria [name], female    DOB: 06/26/1971, 52 y.o.   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 30 tablet 3   EPINEPHrine (EPIPEN 2-PAK) 0.3 mg/0.3 mL IJ SOAJ injection Inject 0.3 mg into the muscle as needed for anaphylaxis. 2 each 0   fluticasone (FLONASE) 50 MCG/ACT nasal spray PLACE 1 SPRAY INTO BOTH NOSTRILS 2 (TWO) TIMES DAILY 48 mL 0   gabapentin (NEURONTIN) 100 MG capsule Take 2 capsules (200 mg total) by mouth at bedtime. 180 capsule 0   potassium chloride SA (KLOR-CON M) 20 MEQ tablet Take 1 tablet (20 mEq total) by mouth 2 (two) times daily. For low potassium. 10 tablet 0   No current facility-administered medications on file prior to visit.    Allergies  Allergen Reactions   Anesthesia S-I-40 [Propofol]     Lethargic after med use   Bee Venom Swelling   Codeine Swelling   Bupropion Anxiety and Other (See Comments)    Worsened anxiety    Past Medical History:  Diagnosis Date   Acute on chronic blood loss anemia 11/02/2021   Acute postoperative anemia due to expected blood loss 09/19/2021   Anemia    Anesthesia complication, initial encounter 09/13/2021   Difficulty waking up from Anesthesia requiring re-intubation   Ankle edema, bilateral 11/15/2021   Anxiety    h/o   Depression    h/o   Family  history of adverse reaction to anesthesia    mom-hard time waking up   Folate deficiency 07/30/2019   Gastritis 08/19/2020   GERD (gastroesophageal reflux disease)    tums prn   Hypertension    WAS PUT ON BP MED BY PCP LAST YEAR (2018) AND BP MED MADE PT SICK SO SHE STOPPED TAKING IT-PCP MONITORS BP NOW   Palpitations 05/18/2020   Pancreatitis    Peptic ulcer    Perforated peptic ulcer (HCC) 09/14/2021   Poison ivy dermatitis 11/13/2019   Upper GI bleed 04/11/2022   Vitamin B12 deficiency 01/17/2018   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    Past Surgical History:  Procedure Laterality Date   ANTERIOR AND POSTERIOR REPAIR WITH SACROSPINOUS FIXATION N/A 03/13/2018   Procedure: ANTERIOR REPAIR;  Surgeon: Nadara Mustard, MD;  Location: [facility] OR[facility];  Service: Gynecology;  Laterality: N/A;   ESOPHAGOGASTRODUODENOSCOPY (EGD) WITH PROPOFOL N/A 03/27/2022   Procedure: ESOPHAGOGASTRODUODENOSCOPY (EGD) WITH PROPOFOL;  Surgeon: Regis Bill, MD;  Location: [facility] END[facility];  Service: Endoscopy;  Laterality: N/A;   ESOPHAGOGASTRODUODENOSCOPY (EGD) WITH PROPOFOL N/A 04/12/2022   Procedure: ESOPHAGOGASTRODUODENOSCOPY (EGD) WITH PROPOFOL;  Surgeon: Toney Reil, MD;  Location: [facility] ENDOSCOPY;  Service: Gastroenterology;  Laterality: N/A;   INDUCED ABORTION     x2   IR CM INJ ANY COLONIC TUBE W/FLUORO  09/29/2021   LAPAROTOMY N/A 09/13/2021   Procedure: EXPLORATORY LAPAROTOMY-[place]raham PATCH repair;  Surgeon: Sung Amabile, DO;  Location: [facility];  Service: General;  Laterality: N/A;   LAPAROTOMY N/A 09/17/2021   Procedure: EXPLORATORY LAPAROTOMY;  Surgeon: Henrene Dodge, MD;  Location: [facility];  Service: General;  Laterality: N/A;   REPAIR OF PERFORATED ULCER N/A 09/17/2021   Procedure: REPAIR OF PERFORATED ULCER;  Surgeon: Henrene Dodge, MD;  Location: [facility];  Service: General;  Laterality: N/A;   VAGINAL HYSTERECTOMY N/A 03/13/2018   Procedure: HYSTERECTOMY VAGINAL;  Surgeon: Nadara Mustard, MD;  Location: [facility];  Service: Gynecology;  Laterality: N/A;    Family History  Problem Relation Age of Onset   Arthritis Mother    Arthritis Father    Alcohol abuse Father    Hypertension Father    Heart disease Father    Aneurysm Father    Breast cancer Maternal Grandmother    Breast cancer Paternal Grandmother     Social History   Socioeconomic History   Marital status: Married    Spouse name: Not on file   Number of children: Not on file   Years of education: Not on file   Highest education level: Some college, no degree  Occupational History   Not on file  Tobacco Use   Smoking status: Every Day    Current packs/day: 1.00    Average packs/day: 1 pack/day for 20.0 years (20.0 ttl pk-yrs)    Types: Cigarettes   Smokeless tobacco: Never  Vaping Use   Vaping status: Never Used  Substance and Sexual Activity   Alcohol use: Yes    Alcohol/week: 2.0 standard drinks of alcohol    Types: 2 Glasses of wine per week   Drug use: No   Sexual activity: Yes  Other Topics Concern   Not on file  Social History Narrative   Married.   1 child.    Working at [employer]	[place] at [employer].     Enjoys swimming, camping   Social Drivers of Health   Financial Resource Strain: Low Risk  (06/13/2023)   Overall Financial Resource Strain (CARDIA)    Difficulty of Paying Living Expenses: Not hard at all  Food Insecurity: No Food Insecurity (06/13/2023)   Hunger Vital Sign    Worried About Running Out of Food in the Last Year: Never true    Ran Out of Food in the Last Year: Never true  Transportation Needs: No Transportation Needs (06/13/2023)   PRAPARE - Administrator, Civil Service (Medical): No    Lack of Transportation (Non-Medical): No  Physical Activity: Unknown (06/13/2023)   Exercise Vital Sign    Days of Exercise per Week: 5 days    Minutes of Exercise per Session: Patient declined  Stress: No Stress Concern Present (06/13/2023)   [employer] of Occupational  Health - Occupational Stress Questionnaire    Feeling of Stress : Not at all  Social Connections: Moderately Isolated (06/13/2023)   Social Connection and Isolation Panel [NHANES]    Frequency of Communication with Friends and Family: More than three times a week    Frequency of Social Gatherings with Friends and Family: Twice a week    Attends Religious Services: Never    Database administrator or Organizations: No    Attends Engineer, structural: Not on file    Marital Status: Married  Catering manager Violence: Not At Risk (04/12/2022)   Humiliation, Afraid, Rape, and Kick questionnaire    Fear of Current or Ex-Partner: No    Emotionally Abused: No  Physically Abused: No    Sexually Abused: No   Review of Systems     Objective:   Physical Exam Musculoskeletal:     Comments: Tender area is just along lateral border of left scapula Normal ROM in arms/shoulders No spine tenderness            Assessment & Plan:

## 2023-10-09 NOTE — Assessment & Plan Note (Signed)
 Marked symptoms in right hand Will refer to hand specialist

## 2023-10-09 NOTE — Telephone Encounter (Signed)
 Chief Complaint: Back Pain Symptoms: back pain Frequency: Ongoing x "weeks" Pertinent Negatives: Patient denies SOB, CP, fever Disposition: [] ED /[] Urgent Care (no appt availability in office) / [x] Appointment(In office/virtual)/ []  [place]/ [] Home Care/ [] Refused Recommended Disposition /[] [place] [facility]/ []  Follow-up with PCP Additional Notes:Pt reports pain to  left shoulder blade, feels like a continuous spasm. Pt reports this has been ongoing for about 3 weeks, has tried home remedies for pulled muscle with no improvement. Deies CP, SOB, HA, vision changes. Pt seen in office today, requests to see PCP. OV scheduled.    Copied from CRM [id number]. Topic: Clinical - Red Word Triage >> Oct 09, 2023  1:05 PM Emylou G wrote: [facility] that prompted transfer to Nurse Triage: Back pain.Marland Kitchen left shoulder back.. having problems with standing and moving.. continuous spasm Reason for Disposition  Nursing judgment  Protocols used: No Guideline or Reference Available-A-AH

## 2023-10-09 NOTE — Telephone Encounter (Signed)
 Noted, will evaluate.

## 2023-10-11 ENCOUNTER — Ambulatory Visit: Admitting: Primary Care

## 2023-10-11 ENCOUNTER — Encounter: Payer: Self-pay | Admitting: Primary Care

## 2023-10-11 VITALS — BP 148/82 | HR 62 | Temp 98.4°F | Ht 62.0 in | Wt 122.0 lb

## 2023-10-11 DIAGNOSIS — G5603 Carpal tunnel syndrome, bilateral upper limbs: Secondary | ICD-10-CM | POA: Diagnosis not present

## 2023-10-11 DIAGNOSIS — G8929 Other chronic pain: Secondary | ICD-10-CM | POA: Diagnosis not present

## 2023-10-11 DIAGNOSIS — M546 Pain in thoracic spine: Secondary | ICD-10-CM | POA: Diagnosis not present

## 2023-10-11 MED ORDER — PREDNISONE 20 MG PO TABS: ORAL_TABLET | ORAL | 0 refills | Status: DC

## 2023-10-11 NOTE — Assessment & Plan Note (Signed)
 Abnormality noted on exam. Question if this is normal anatomy or not.  She will have her husband take a look. Consider ultrasound or MRI.  Reviewed xray from 2023.  She will refill her cyclobenzaprine 5 mg tablets.  Drowsiness precautions provided. Referral placed for physical therapy.

## 2023-10-11 NOTE — Patient Instructions (Addendum)
 Start prednisone tablets. Take two tablets my mouth once daily in the morning for four days, then one tablet once daily in the morning for four days.   Refill the cyclobenzaprine muscle relaxer as discussed.  You will either be contacted via phone regarding your referral to physical therapy, or you may receive a letter on your MyChart portal from our referral team with instructions for scheduling an appointment. Please let us know if you have not been contacted by anyone within two weeks.  Call the hand specialist for your carpel tunnel symptoms. Maria [name] [address] [place] [address] [phone]    Referral Start Date: 10/09/2023 Referral End Date: 10/08/2024      It was a pleasure to see you today!

## 2023-10-11 NOTE — Assessment & Plan Note (Signed)
 Right greater than left.  Obvious signs on exam today. She was already referred to a hand surgeon/specialist during her visit on 10/09/2023.  She did not mention this during her visit. We will have her call for an appointment.

## 2023-10-11 NOTE — Progress Notes (Signed)
 Subjective:    Patient ID: Maria [name], female    DOB: 05-17-1971, 53 y.o.   MRN: [id number]  Back Pain Associated symptoms include numbness.    Maria [name] is a very pleasant 53 y.o. female with a history of myalgias, thoracic back pain, alcohol use disorder, chronic pancreatitis, peptic ulcer disease, tobacco use who presents today to discuss back pain.  1) Thoracic Back Pain: Chronic to the left mid thoracic back just lateral to the spine. Her pain is daily, constant, waxes and wanes during the day. Her pain is worse while at work or with other physical activity. She works at the [employer] doing [employer]. Her pain will put her into tears. Is active everyday. Last week she unloaded numerous bags of mulch. She tries stretching and takes Tylenol with little improvement.   She has never tried PT.  2) Paresthesias: Chronic for the last 3-4 months, worse over the last several weeks. Her symptoms are located to the right forearm with radiation down to her first three digits. She's been wearing brace at night to her arm at night with little improvement. Her symptoms will wake her from night sometimes. She's not undergone testing for carpal tunnel.   She denies neck pain, left upper extremity pain, color changes.   BP Readings from Last 3 Encounters:  10/11/23 (!) 148/82  10/09/23 136/76  08/13/23 132/68      Review of Systems  Musculoskeletal:  Positive for back pain and myalgias.  Skin:  Negative for color change.  Neurological:  Positive for numbness.         Past Medical History:  Diagnosis Date   Acute on chronic blood loss anemia 11/02/2021   Acute postoperative anemia due to expected blood loss 09/19/2021   Anemia    Anesthesia complication, initial encounter 09/13/2021   Difficulty waking up from Anesthesia requiring re-intubation   Ankle edema, bilateral 11/15/2021   Anxiety    h/o   Depression    h/o   Family history of adverse reaction to  anesthesia    mom-hard time waking up   Folate deficiency 07/30/2019   Gastritis 08/19/2020   GERD (gastroesophageal reflux disease)    tums prn   Hypertension    WAS PUT ON BP MED BY PCP LAST YEAR (2018) AND BP MED MADE PT SICK SO SHE STOPPED TAKING IT-PCP MONITORS BP NOW   Palpitations 05/18/2020   Pancreatitis    Peptic ulcer    Perforated peptic ulcer (HCC) 09/14/2021   Poison ivy dermatitis 11/13/2019   Upper GI bleed 04/11/2022   Vitamin B12 deficiency 01/17/2018   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    Social History   Socioeconomic History   Marital status: Married    Spouse name: Not on file   Number of children: Not on file   Years of education: Not on file   Highest education level: Some college, no degree  Occupational History   Not on file  Tobacco Use   Smoking status: Every Day    Current packs/day: 1.00    Average packs/day: 1 pack/day for 20.0 years (20.0 ttl pk-yrs)    Types: Cigarettes   Smokeless tobacco: Never  Vaping Use   Vaping status: Never Used  Substance and Sexual Activity   Alcohol use: Yes    Alcohol/week: 2.0 standard drinks of alcohol    Types: 2 Glasses of wine per week   Drug use: No   Sexual activity: Yes  Other Topics  Concern   Not on file  Social History Narrative   Married.   1 child.    Working at [employer]	[place] at [employer].     Enjoys swimming, camping   Social Drivers of Health   Financial Resource Strain: Low Risk  (06/13/2023)   Overall Financial Resource Strain (CARDIA)    Difficulty of Paying Living Expenses: Not hard at all  Food Insecurity: No Food Insecurity (06/13/2023)   Hunger Vital Sign    Worried About Running Out of Food in the Last Year: Never true    Ran Out of Food in the Last Year: Never true  Transportation Needs: No Transportation Needs (06/13/2023)   PRAPARE - Administrator, Civil Service (Medical): No    Lack of Transportation (Non-Medical): No  Physical Activity: Unknown (06/13/2023)   Exercise  Vital Sign    Days of Exercise per Week: 5 days    Minutes of Exercise per Session: Patient declined  Stress: No Stress Concern Present (06/13/2023)   [employer] of Occupational Health - Occupational Stress Questionnaire    Feeling of Stress : Not at all  Social Connections: Moderately Isolated (06/13/2023)   Social Connection and Isolation Panel [NHANES]    Frequency of Communication with Friends and Family: More than three times a week    Frequency of Social Gatherings with Friends and Family: Twice a week    Attends Religious Services: Never    Database administrator or Organizations: No    Attends Engineer, structural: Not on file    Marital Status: Married  Catering manager Violence: Not At Risk (04/12/2022)   Humiliation, Afraid, Rape, and Kick questionnaire    Fear of Current or Ex-Partner: No    Emotionally Abused: No    Physically Abused: No    Sexually Abused: No    Past Surgical History:  Procedure Laterality Date   ANTERIOR AND POSTERIOR REPAIR WITH SACROSPINOUS FIXATION N/A 03/13/2018   Procedure: ANTERIOR REPAIR;  Surgeon: Nadara Mustard, MD;  Location: [facility];  Service: Gynecology;  Laterality: N/A;   ESOPHAGOGASTRODUODENOSCOPY (EGD) WITH PROPOFOL N/A 03/27/2022   Procedure: ESOPHAGOGASTRODUODENOSCOPY (EGD) WITH PROPOFOL;  Surgeon: Regis Bill, MD;  Location: [facility];  Service: Endoscopy;  Laterality: N/A;   ESOPHAGOGASTRODUODENOSCOPY (EGD) WITH PROPOFOL N/A 04/12/2022   Procedure: ESOPHAGOGASTRODUODENOSCOPY (EGD) WITH PROPOFOL;  Surgeon: Toney Reil, MD;  Location: [facility];  Service: Gastroenterology;  Laterality: N/A;   INDUCED ABORTION     x2   IR CM INJ ANY COLONIC TUBE W/FLUORO  09/29/2021   LAPAROTOMY N/A 09/13/2021   Procedure: EXPLORATORY LAPAROTOMY-Graham PATCH repair;  Surgeon: Sung Amabile, DO;  Location: [facility];  Service: General;  Laterality: N/A;   LAPAROTOMY N/A 09/17/2021   Procedure: EXPLORATORY  LAPAROTOMY;  Surgeon: Henrene Dodge, MD;  Location: [facility];  Service: General;  Laterality: N/A;   REPAIR OF PERFORATED ULCER N/A 09/17/2021   Procedure: REPAIR OF PERFORATED ULCER;  Surgeon: Henrene Dodge, MD;  Location: [facility];  Service: General;  Laterality: N/A;   VAGINAL HYSTERECTOMY N/A 03/13/2018   Procedure: HYSTERECTOMY VAGINAL;  Surgeon: Nadara Mustard, MD;  Location: [facility];  Service: Gynecology;  Laterality: N/A;    Family History  Problem Relation Age of Onset   Arthritis Mother    Arthritis Father    Alcohol abuse Father    Hypertension Father    Heart disease Father    Aneurysm Father    Breast cancer Maternal Grandmother  Breast cancer Paternal Grandmother     Allergies  Allergen Reactions   Anesthesia S-I-40 [Propofol]     Lethargic after med use   Bee Venom Swelling   Codeine Swelling   Bupropion Anxiety and Other (See Comments)    Worsened anxiety    Current Outpatient Medications on File Prior to Visit  Medication Sig Dispense Refill   cyclobenzaprine (FLEXERIL) 5 MG tablet TAKE 1 TABLET BY MOUTH 2 TIMES DAILY AS NEEDED FOR MUSCLE SPASMS. 30 tablet 3   EPINEPHrine (EPIPEN 2-PAK) 0.3 mg/0.3 mL IJ SOAJ injection Inject 0.3 mg into the muscle as needed for anaphylaxis. 2 each 0   fluticasone (FLONASE) 50 MCG/ACT nasal spray PLACE 1 SPRAY INTO BOTH NOSTRILS 2 (TWO) TIMES DAILY 48 mL 0   gabapentin (NEURONTIN) 100 MG capsule Take 2 capsules (200 mg total) by mouth at bedtime. 180 capsule 0   potassium chloride SA (KLOR-CON M) 20 MEQ tablet Take 1 tablet (20 mEq total) by mouth 2 (two) times daily. For low potassium. 10 tablet 0   No current facility-administered medications on file prior to visit.    BP (!) 148/82   Pulse 62   Temp 98.4 F (36.9 C) (Temporal)   Ht 5\' 2"  (1.575 m)   Wt 122 lb (55.3 kg)   LMP 02/24/2018 (Approximate) Comment: spotting   SpO2 100%   BMI 22.31 kg/m  Objective:   Physical Exam Cardiovascular:     Rate and Rhythm:  Normal rate and regular rhythm.     Pulses:          Radial pulses are 2+ on the right side and 2+ on the left side.  Pulmonary:     Effort: Pulmonary effort is normal.  Musculoskeletal:     Cervical back: Neck supple.     Thoracic back: Swelling present. No tenderness or bony tenderness. Normal range of motion.       Back:     Comments: Mild swelling to left thoracic back compared to right.   Skin:    General: Skin is warm and dry.  Neurological:     Mental Status: She is alert and oriented to person, place, and time.     Comments: Positive tinel's sign to right wrist.  Negative phalen's sign.  Psychiatric:        Mood and Affect: Mood normal.           Assessment & Plan:  Chronic left-sided thoracic back pain Assessment & Plan: Abnormality noted on exam. Question if this is normal anatomy or not.  She will have her husband take a look. Consider ultrasound or MRI.  Reviewed xray from 2023.  She will refill her cyclobenzaprine 5 mg tablets.  Drowsiness precautions provided. Referral placed for physical therapy.  Orders: -     predniSONE; Take two tablets my mouth once daily in the morning for four days, then one tablet once daily in the morning for four days.  Dispense: 12 tablet; Refill: 0 -     Ambulatory referral to Physical Therapy  Bilateral carpal tunnel syndrome Assessment & Plan: Right greater than left.  Obvious signs on exam today. She was already referred to a hand surgeon/specialist during her visit on 10/09/2023.  She did not mention this during her visit. We will have her call for an appointment.          Doreene Nest, NP

## 2023-10-20 NOTE — Progress Notes (Deleted)
     Maria [name] T. Darvin Dials, MD, CAQ Sports Medicine [name] at [name], [id number]  Phone: [phone]  FAX: [phone]  Maria [name] - 53 y.o. female  MRN [id number]  Date of Birth: 1971-01-10  Date: 10/21/2023  PCP: Maria John, NP  Referral: Maria John, NP  No chief complaint on file.  Subjective:   Maria [name] is a 53 y.o. very pleasant female patient with There is no height or weight on file to calculate BMI. who presents with the following:  [name] is a very nice patient, who I recall quite well over the years.  She presents in follow-up with me.  She recently saw Dr. Fulton [name] last week for some chronic left-sided thoracic back pain, she was given some prednisone  and was referred to formal physical therapy.  She also has had some carpal tunnel syndrome for symptoms. She did previously see Dr. Celestino [name].    Review of Systems is noted in the HPI, as appropriate  Objective:   LMP 02/24/2018 (Approximate) Comment: spotting   GEN: No acute distress; alert,appropriate. PULM: Breathing comfortably in no respiratory distress PSYCH: Normally interactive.   Laboratory and Imaging Data:  Assessment and Plan:   ***

## 2023-10-21 ENCOUNTER — Ambulatory Visit: Admitting: Family Medicine

## 2023-10-25 ENCOUNTER — Ambulatory Visit: Admitting: Family Medicine

## 2023-11-07 ENCOUNTER — Encounter: Payer: Self-pay | Admitting: Primary Care

## 2023-11-07 ENCOUNTER — Ambulatory Visit (INDEPENDENT_AMBULATORY_CARE_PROVIDER_SITE_OTHER)
Admission: RE | Admit: 2023-11-07 | Discharge: 2023-11-07 | Disposition: A | Discharge status: Home / Self Care | Source: Ambulatory Visit | Attending: Primary Care | Admitting: Primary Care

## 2023-11-07 ENCOUNTER — Ambulatory Visit: Admitting: Primary Care

## 2023-11-07 VITALS — BP 136/72 | HR 77 | Temp 97.3°F | Ht 62.0 in | Wt 118.0 lb

## 2023-11-07 DIAGNOSIS — M546 Pain in thoracic spine: Secondary | ICD-10-CM

## 2023-11-07 DIAGNOSIS — G8929 Other chronic pain: Secondary | ICD-10-CM

## 2023-11-07 DIAGNOSIS — R229 Localized swelling, mass and lump, unspecified: Secondary | ICD-10-CM

## 2023-11-07 DIAGNOSIS — M419 Scoliosis, unspecified: Secondary | ICD-10-CM | POA: Diagnosis not present

## 2023-11-07 NOTE — Assessment & Plan Note (Signed)
 Temporary improvement with medications.  Recommended she call to schedule physical therapy. Increase cyclobenzaprine  to up to 1 pill 3 times daily Reduce consumption of Tylenol .  X-ray of thoracic spine ordered and pending today. Consider CT.

## 2023-11-07 NOTE — Patient Instructions (Signed)
 Increase your cyclobenzaprine  to 1 tablet up to 3 times daily.  Reduce your intake of Tylenol  as discussed.  Complete xray(s) prior to leaving today. I will notify you of your results once received.  You will receive a phone call regarding ultrasound.  It was a pleasure to see you today!

## 2023-11-07 NOTE — Progress Notes (Signed)
 Subjective:    Patient ID: Maria [name], female    DOB: 1970-07-04, 53 y.o.   MRN: [id number]  Back Pain Pertinent negatives include no numbness.    Maria [name] is a very pleasant 53 y.o. female with a history of chronic thoracic back pain, peripheral neuropathy, carpal tunnel syndrome, alcohol use disorder with a history of who presents today to discuss back pain.  Evaluated on 10/11/2023 for chronic, daily, constant, left mid thoracic back pain.  During this visit swelling was noted at the site of pain.  Her cyclobenzaprine  tablets were refilled and a referral was placed to physical therapy.  Today she continues to experience daily left sided thoracic back pain that will spasm intermittently. Her symptoms occur both at work and at home, but mostly when active. She works in [employer] for a [employer]. She's having a hard time sleeping and getting comfortable. She is taking 1000 mg of Tylenol  every 6 hours which helps temporarily. The cyclobenzaprine  helps temporarily, she takes only at night. Denies drowsiness.   She has been contacted by physical therapy but has yet to schedule an appointment. She underwent an xray of the thoracic spine  2 years ago which was negative.   Review of Systems  Musculoskeletal:  Positive for back pain and myalgias. Negative for neck pain.  Skin:  Negative for color change.  Neurological:  Negative for numbness.         Past Medical History:  Diagnosis Date   Acute on chronic blood loss anemia 11/02/2021   Acute postoperative anemia due to expected blood loss 09/19/2021   Anemia    Anesthesia complication, initial encounter 09/13/2021   Difficulty waking up from Anesthesia requiring re-intubation   Ankle edema, bilateral 11/15/2021   Anxiety    h/o   Depression    h/o   Family history of adverse reaction to anesthesia    mom-hard time waking up   Folate deficiency 07/30/2019   Gastritis 08/19/2020   GERD (gastroesophageal  reflux disease)    tums prn   Hypertension    WAS PUT ON BP MED BY PCP LAST YEAR (2018) AND BP MED MADE PT SICK SO SHE STOPPED TAKING IT-PCP MONITORS BP NOW   Palpitations 05/18/2020   Pancreatitis    Peptic ulcer    Perforated peptic ulcer (HCC) 09/14/2021   Poison ivy dermatitis 11/13/2019   Upper GI bleed 04/11/2022   Vitamin B12 deficiency 01/17/2018   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    Social History   Socioeconomic History   Marital status: Married    Spouse name: Not on file   Number of children: Not on file   Years of education: Not on file   Highest education level: Some college, no degree  Occupational History   Not on file  Tobacco Use   Smoking status: Every Day    Current packs/day: 1.00    Average packs/day: 1 pack/day for 20.0 years (20.0 ttl pk-yrs)    Types: Cigarettes   Smokeless tobacco: Never  Vaping Use   Vaping status: Never Used  Substance and Sexual Activity   Alcohol use: Yes    Alcohol/week: 2.0 standard drinks of alcohol    Types: 2 Glasses of wine per week   Drug use: No   Sexual activity: Yes  Other Topics Concern   Not on file  Social History Narrative   Married.   1 child.    Working at [employer]	[place] at [employer].  Enjoys swimming, camping   Social Drivers of Health   Financial Resource Strain: Low Risk  (06/13/2023)   Overall Financial Resource Strain (CARDIA)    Difficulty of Paying Living Expenses: Not hard at all  Food Insecurity: No Food Insecurity (06/13/2023)   Hunger Vital Sign    Worried About Running Out of Food in the Last Year: Never true    Ran Out of Food in the Last Year: Never true  Transportation Needs: No Transportation Needs (06/13/2023)   PRAPARE - Administrator, Civil Service (Medical): No    Lack of Transportation (Non-Medical): No  Physical Activity: Unknown (06/13/2023)   Exercise Vital Sign    Days of Exercise per Week: 5 days    Minutes of Exercise per Session: Patient declined  Stress: No  Stress Concern Present (06/13/2023)   [employer] of Occupational Health - Occupational Stress Questionnaire    Feeling of Stress : Not at all  Social Connections: Moderately Isolated (06/13/2023)   Social Connection and Isolation Panel [NHANES]    Frequency of Communication with Friends and Family: More than three times a week    Frequency of Social Gatherings with Friends and Family: Twice a week    Attends Religious Services: Never    Database administrator or Organizations: No    Attends Engineer, structural: Not on file    Marital Status: Married  Catering manager Violence: Not At Risk (04/12/2022)   Humiliation, Afraid, Rape, and Kick questionnaire    Fear of Current or Ex-Partner: No    Emotionally Abused: No    Physically Abused: No    Sexually Abused: No    Past Surgical History:  Procedure Laterality Date   ANTERIOR AND POSTERIOR REPAIR WITH SACROSPINOUS FIXATION N/A 03/13/2018   Procedure: ANTERIOR REPAIR;  Surgeon: Alben Alma, MD;  Location: [facility];  Service: Gynecology;  Laterality: N/A;   ESOPHAGOGASTRODUODENOSCOPY (EGD) WITH PROPOFOL  N/A 03/27/2022   Procedure: ESOPHAGOGASTRODUODENOSCOPY (EGD) WITH PROPOFOL ;  Surgeon: Shane Darling, MD;  Location: [facility];  Service: Endoscopy;  Laterality: N/A;   ESOPHAGOGASTRODUODENOSCOPY (EGD) WITH PROPOFOL  N/A 04/12/2022   Procedure: ESOPHAGOGASTRODUODENOSCOPY (EGD) WITH PROPOFOL ;  Surgeon: Selena Daily, MD;  Location: [facility];  Service: Gastroenterology;  Laterality: N/A;   INDUCED ABORTION     x2   IR CM INJ ANY COLONIC TUBE W/FLUORO  09/29/2021   LAPAROTOMY N/A 09/13/2021   Procedure: EXPLORATORY LAPAROTOMY-Graham PATCH repair;  Surgeon: Conrado Delay, DO;  Location: [facility];  Service: General;  Laterality: N/A;   LAPAROTOMY N/A 09/17/2021   Procedure: EXPLORATORY LAPAROTOMY;  Surgeon: Emmalene Hare, MD;  Location: [facility];  Service: General;  Laterality: N/A;   REPAIR OF PERFORATED  ULCER N/A 09/17/2021   Procedure: REPAIR OF PERFORATED ULCER;  Surgeon: Emmalene Hare, MD;  Location: [facility];  Service: General;  Laterality: N/A;   VAGINAL HYSTERECTOMY N/A 03/13/2018   Procedure: HYSTERECTOMY VAGINAL;  Surgeon: Alben Alma, MD;  Location: [facility];  Service: Gynecology;  Laterality: N/A;    Family History  Problem Relation Age of Onset   Arthritis Mother    Arthritis Father    Alcohol abuse Father    Hypertension Father    Heart disease Father    Aneurysm Father    Breast cancer Maternal Grandmother    Breast cancer Paternal Grandmother     Allergies  Allergen Reactions   Anesthesia S-I-40 [Propofol ]     Lethargic after med use   Bee Venom  Swelling   Codeine Swelling   Bupropion Anxiety and Other (See Comments)    Worsened anxiety    Current Outpatient Medications on File Prior to Visit  Medication Sig Dispense Refill   cyclobenzaprine  (FLEXERIL ) 5 MG tablet TAKE 1 TABLET BY MOUTH 2 TIMES DAILY AS NEEDED FOR MUSCLE SPASMS. 30 tablet 3   EPINEPHrine  (EPIPEN  2-PAK) 0.3 mg/0.3 mL IJ SOAJ injection Inject 0.3 mg into the muscle as needed for anaphylaxis. 2 each 0   gabapentin  (NEURONTIN ) 100 MG capsule Take 2 capsules (200 mg total) by mouth at bedtime. 180 capsule 0   fluticasone  (FLONASE ) 50 MCG/ACT nasal spray PLACE 1 SPRAY INTO BOTH NOSTRILS 2 (TWO) TIMES DAILY (Patient not taking: Reported on 11/07/2023) 48 mL 0   No current facility-administered medications on file prior to visit.    BP 136/72   Pulse 77   Temp (!) 97.3 F (36.3 C) (Temporal)   Ht 5\' 2"  (1.575 m)   Wt 118 lb (53.5 kg)   LMP 02/24/2018 (Approximate) Comment: spotting   SpO2 99%   BMI 21.58 kg/m  Objective:   Physical Exam Constitutional:      General: She is not in acute distress. Cardiovascular:     Rate and Rhythm: Normal rate.  Pulmonary:     Effort: Pulmonary effort is normal.  Musculoskeletal:     Thoracic back: Swelling and tenderness present. No bony tenderness.        Back:     Comments: Mobile, deep soft tissue mass noted to mid thoracic back just left of the spine.           Assessment & Plan:  Chronic left-sided thoracic back pain Assessment & Plan: Temporary improvement with medications.  Recommended she call to schedule physical therapy. Increase cyclobenzaprine  to up to 1 pill 3 times daily Reduce consumption of Tylenol .  X-ray of thoracic spine ordered and pending today. Consider CT.  Orders: -     DG Thoracic Spine W/Swimmers  Localized skin mass, lump, or swelling Assessment & Plan: To thoracic back. Could be lipoma/cyst.  Given her ongoing thoracic back pain, will obtain soft tissue ultrasound for further evaluation.  She agrees.  Orders placed.  Orders: -     DG Thoracic Spine W/Swimmers -     US  CHEST SOFT TISSUE; Future        Kenniel Bergsma K Ziggy Chanthavong, NP

## 2023-11-07 NOTE — Assessment & Plan Note (Signed)
 To thoracic back. Could be lipoma/cyst.  Given her ongoing thoracic back pain, will obtain soft tissue ultrasound for further evaluation.  She agrees.  Orders placed.

## 2023-11-14 ENCOUNTER — Encounter: Payer: Self-pay | Admitting: Family Medicine

## 2023-11-14 ENCOUNTER — Ambulatory Visit: Admitting: Family Medicine

## 2023-11-14 VITALS — BP 110/60 | HR 81 | Temp 98.4°F | Ht 62.0 in | Wt 123.0 lb

## 2023-11-14 DIAGNOSIS — G2589 Other specified extrapyramidal and movement disorders: Secondary | ICD-10-CM

## 2023-11-14 DIAGNOSIS — M546 Pain in thoracic spine: Secondary | ICD-10-CM | POA: Diagnosis not present

## 2023-11-14 DIAGNOSIS — G8929 Other chronic pain: Secondary | ICD-10-CM | POA: Diagnosis not present

## 2023-11-14 MED ORDER — GABAPENTIN 100 MG PO CAPS: 200.0000 mg | ORAL_CAPSULE | Freq: Every day | ORAL | 1 refills | Status: DC

## 2023-11-14 MED ORDER — PREDNISONE 20 MG PO TABS: ORAL_TABLET | ORAL | 0 refills | Status: DC

## 2023-11-14 MED ORDER — TIZANIDINE HCL 4 MG PO TABS: 4.0000 mg | ORAL_TABLET | Freq: Every evening | ORAL | 2 refills | Status: DC | PRN

## 2023-11-14 NOTE — Progress Notes (Signed)
 Maria Kleier T. Karalyne Nusser, MD, CAQ Sports Medicine [facility] at [facility] [address] [place], [address]  Phone: [phone]  FAX: [phone]  Maria [name] - 53 y.o. female  MRN [id number]  Date of Birth: Apr 18, 1971  Date: 11/14/2023  PCP: Gabriel John, NP  Referral: Gabriel John, NP  Chief Complaint  Patient presents with   Spasms   Back Pain    Left Shoulder Blade Area   Subjective:   Maria [name] is a 53 y.o. very pleasant female patient with Body mass index is 22.5 kg/m. who presents with the following:  [name] is a very nice patient who I recall quite well, having seen her multiple times over the years.  She presents with some pain in the parascapular region, predominantly more in the left side over the last weeks to months.  She has been quite active and has been pushing herself physically of late.  She did pull out 20 bags of mulch that she thinks could have irritated the situation.  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  She does not describe any numbness, tingling, or functional weakness. She does not have any neck pain and she does not describe any lumbar spine pain.  She has had some issues in this area before.  Primary care provider did suggest physical therapy about a month ago, she ultimately did not do that.  We have referred her to physical therapy previously, as well.  Review of Systems is noted in the HPI, as appropriate  Objective:   BP 110/60   Pulse 81   Temp 98.4 F (36.9 C) (Temporal)   Ht 5\' 2"  (1.575 m)   Wt 123 lb (55.8 kg)   LMP 02/24/2018 (Approximate) Comment: spotting   SpO2 98%   BMI 22.50 kg/m   GEN: No acute distress; alert,appropriate. PULM: Breathing comfortably in no respiratory distress PSYCH: Normally interactive.    CERVICAL SPINE EXAM Range of motion: Flexion, extension, lateral bending, and rotation: She has roughly  15% loss of motion in all directions Spurling's: Negative Pain with terminal motion: Yes Spinous Processes: NT SCM: NT Upper paracervical muscles: Yes tender to palpation Upper traps: Tender to palpation in mid to lower trap C5-T1 intact, sensation and motor   Laboratory and Imaging Data:  Assessment and Plan:     ICD-10-CM   1. Chronic left-sided thoracic back pain  M54.6 Ambulatory referral to Physical Therapy   G89.29     2. Scapular dyskinesis  G25.89 Ambulatory referral to Physical Therapy     Think a lot of this is overuse with some irritation and strain at the parascapular musculature in the middle to lower thoracic region, lower trapezius versus rhomboid versus combined.  Certainly could be a component of referred pain from thoracic spine, as well.  Going to give her a long course of some steroids, gabapentin , and Zanaflex  at nighttime.  Will also refer her to physical therapy, and I encouraged her to check about her copayments.  Medication Management during today's office visit: Meds ordered this encounter  Medications   gabapentin  (NEURONTIN ) 100 MG capsule    Sig: Take 2 capsules (200 mg total) by mouth at bedtime.    Dispense:  180 capsule    Refill:  1   predniSONE  (DELTASONE ) 20 MG tablet    Sig: 2 tabs po daily for 5 days, then 1 tab po daily  for 5 days    Dispense:  15 tablet    Refill:  0   tiZANidine  (ZANAFLEX ) 4 MG tablet    Sig: Take 1 tablet (4 mg total) by mouth at bedtime as needed for muscle spasms.    Dispense:  30 tablet    Refill:  2   Medications Discontinued During This Encounter  Medication Reason   fluticasone  (FLONASE ) 50 MCG/ACT nasal spray Completed Course   gabapentin  (NEURONTIN ) 100 MG capsule Reorder   cyclobenzaprine  (FLEXERIL ) 5 MG tablet     Orders placed today for conditions managed today: Orders Placed This Encounter  Procedures   Ambulatory referral to Physical Therapy    Disposition: 4 to 6 weeks  [employer]  One speech-to-text software was used for transcription in this dictation.  Possible transcriptional errors can occur using Animal nutritionist.   Signed,  Ranny Bye. Blima Jaimes, MD   Outpatient Encounter Medications as of 11/14/2023  Medication Sig   EPINEPHrine  (EPIPEN  2-PAK) 0.3 mg/0.3 mL IJ SOAJ injection Inject 0.3 mg into the muscle as needed for anaphylaxis.   predniSONE  (DELTASONE ) 20 MG tablet 2 tabs po daily for 5 days, then 1 tab po daily for 5 days   tiZANidine  (ZANAFLEX ) 4 MG tablet Take 1 tablet (4 mg total) by mouth at bedtime as needed for muscle spasms.   [DISCONTINUED] cyclobenzaprine  (FLEXERIL ) 5 MG tablet TAKE 1 TABLET BY MOUTH 2 TIMES DAILY AS NEEDED FOR MUSCLE SPASMS.   [DISCONTINUED] gabapentin  (NEURONTIN ) 100 MG capsule Take 2 capsules (200 mg total) by mouth at bedtime.   gabapentin  (NEURONTIN ) 100 MG capsule Take 2 capsules (200 mg total) by mouth at bedtime.   [DISCONTINUED] fluticasone  (FLONASE ) 50 MCG/ACT nasal spray PLACE 1 SPRAY INTO BOTH NOSTRILS 2 (TWO) TIMES DAILY (Patient not taking: Reported on 11/07/2023)   No facility-administered encounter medications on file as of 11/14/2023.

## 2023-12-02 ENCOUNTER — Other Ambulatory Visit: Payer: Self-pay | Admitting: Primary Care

## 2023-12-02 DIAGNOSIS — K279 Peptic ulcer, site unspecified, unspecified as acute or chronic, without hemorrhage or perforation: Secondary | ICD-10-CM

## 2023-12-02 DIAGNOSIS — R1010 Upper abdominal pain, unspecified: Secondary | ICD-10-CM

## 2023-12-10 ENCOUNTER — Ambulatory Visit: Payer: Self-pay

## 2023-12-10 NOTE — Telephone Encounter (Signed)
 Noted, will evaluate.

## 2023-12-10 NOTE — Telephone Encounter (Signed)
 FYI Only or Action Required?: FYI only for provider  Patient was last seen in primary care on 11/14/2023 by Scherrie Curt, MD. Called Nurse Triage reporting Back Pain. Symptoms began several months ago. Interventions attempted: Prescription medications: Flexeril . Symptoms are: gradually worsening.  Triage Disposition: See PCP When Office is Open (Within 3 Days)  Patient/caregiver understands and will follow disposition?: yes                  Copied from CRM [id number]. Topic: Clinical - Red Word Triage >> Dec 10, 2023  3:54 PM Rosamond Comes wrote: Red Word that prompted transfer to Nurse Triage: Patient calling worsening left side back pain a constant muscle cramp asking to schedule appt Reason for Disposition  [1] MODERATE back pain (e.g., interferes with normal activities) AND [2] present > 3 days  Answer Assessment - Initial Assessment Questions 1. ONSET: "When did the pain begin?"      "It's been months now" 2. LOCATION: "Where does it hurt?" (upper, mid or lower back)     Back, edge of L shoulder edge  3. SEVERITY: "How bad is the pain?"  (e.g., Scale 1-10; mild, moderate, or severe)   - MILD (1-3): Doesn't interfere with normal activities.    - MODERATE (4-7): Interferes with normal activities or awakens from sleep.    - SEVERE (8-10): Excruciating pain, unable to do any normal activities.      8/10 "on a good day" 4. PATTERN: "Is the pain constant?" (e.g., yes, no; constant, intermittent)      yes 5. RADIATION: "Does the pain shoot into your legs or somewhere else?"     No  6. CAUSE:  "What do you think is causing the back pain?"      Not sure, this is chronic  7. BACK OVERUSE:  "Any recent lifting of heavy objects, strenuous work or exercise?"     Not outside of the usual 8. MEDICINES: "What have you taken so far for the pain?" (e.g., nothing, acetaminophen , NSAIDS)     Flexeril   9. NEUROLOGIC SYMPTOMS: "Do you have any weakness, numbness, or problems with  bowel/bladder control?"     No  10. OTHER SYMPTOMS: "Do you have any other symptoms?" (e.g., fever, abdomen pain, burning with urination, blood in urine)       Back/L shoulder blade  Taking flexeril  as needed; [facility] recommends PT, insurance did not approve  Pred prescribed 5/15, worked, and then pain came back  Trying not to take too much medicine, hx of stomach ulcers   Denies urinary symptoms. Denies abdominal pain  Protocols used: Back Pain-A-AH

## 2023-12-11 ENCOUNTER — Ambulatory Visit: Admitting: Primary Care

## 2023-12-18 ENCOUNTER — Ambulatory Visit: Admitting: Primary Care

## 2023-12-30 ENCOUNTER — Telehealth: Payer: Self-pay

## 2023-12-30 NOTE — Telephone Encounter (Signed)
 Copied from CRM [id number]. Topic: Appointments - Appointment Scheduling >> Dec 30, 2023  9:41 AM Zenovia PARAS wrote: Patient/patient representative is calling to schedule an appointment. On going issue with left shoulder blade. Would like a call to discuss

## 2023-12-30 NOTE — Telephone Encounter (Signed)
 Appt scheduled for tomorrow with Mallie.

## 2023-12-31 ENCOUNTER — Ambulatory Visit: Admitting: Primary Care

## 2023-12-31 ENCOUNTER — Encounter: Payer: Self-pay | Admitting: Primary Care

## 2023-12-31 VITALS — BP 162/88 | HR 88 | Temp 97.9°F | Ht 62.0 in | Wt 122.0 lb

## 2023-12-31 DIAGNOSIS — M546 Pain in thoracic spine: Secondary | ICD-10-CM

## 2023-12-31 DIAGNOSIS — G8929 Other chronic pain: Secondary | ICD-10-CM

## 2023-12-31 NOTE — Progress Notes (Signed)
 Subjective:    Patient ID: Maria [name], female    DOB: 1970-08-07, 53 y.o.   MRN: [id number]  Shoulder Pain  Pertinent negatives include no numbness.    Maria [name] is a very pleasant 53 y.o. female with a history of peripheral neuropathy, bilateral carpal tunnel syndrome, chronic thoracic back pain who presents today to discuss shoulder and back pain.  Her pain is located to the left scapular area with radiation down left thoracic back. She describes her symptoms as a squeezing and tightness. Chronic, but increased symptoms over the last 10 days. Prior to increased symptoms she was helping to build a gazebo. She works in [employer] facility for [employer], is very active with same repetitive movements.   She has been taking cyclobenzaprine  twice daily for the last two weeks and is not noticing improvement. She is also taking Ibuprofen and Tylenol  with little improvement. She does notice improvement with laying down and stretching. She does have a prescription for gabapentin  for which she has not taken.   She underwent plain films of the thoracic spine in May 2025 which showed mild scoliosis, otherwise no acute abnormality.  She has been treated several times for her thoracic back pain with muscle relaxer and steroid medicine.  She has also been referred to physical therapy for which she has yet to schedule.  She has not undergone CT of the thoracic spine.   Review of Systems  Musculoskeletal:  Positive for myalgias. Negative for arthralgias.  Neurological:  Negative for numbness.         Past Medical History:  Diagnosis Date   Acute on chronic blood loss anemia 11/02/2021   Acute postoperative anemia due to expected blood loss 09/19/2021   Anemia    Anesthesia complication, initial encounter 09/13/2021   Difficulty waking up from Anesthesia requiring re-intubation   Ankle edema, bilateral 11/15/2021   Anxiety    h/o   Depression    h/o   Family history of adverse  reaction to anesthesia    mom-hard time waking up   Folate deficiency 07/30/2019   Gastritis 08/19/2020   GERD (gastroesophageal reflux disease)    tums prn   Hypertension    WAS PUT ON BP MED BY PCP LAST YEAR (2018) AND BP MED MADE PT SICK SO SHE STOPPED TAKING IT-PCP MONITORS BP NOW   Palpitations 05/18/2020   Pancreatitis    Peptic ulcer    Perforated peptic ulcer (HCC) 09/14/2021   Poison ivy dermatitis 11/13/2019   Upper GI bleed 04/11/2022   Vitamin B12 deficiency 01/17/2018   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    Social History   Socioeconomic History   Marital status: Married    Spouse name: Not on file   Number of children: Not on file   Years of education: Not on file   Highest education level: Some college, no degree  Occupational History   Not on file  Tobacco Use   Smoking status: Every Day    Current packs/day: 1.00    Average packs/day: 1 pack/day for 20.0 years (20.0 ttl pk-yrs)    Types: Cigarettes   Smokeless tobacco: Never  Vaping Use   Vaping status: Never Used  Substance and Sexual Activity   Alcohol use: Yes    Alcohol/week: 2.0 standard drinks of alcohol    Types: 2 Glasses of wine per week   Drug use: No   Sexual activity: Yes  Other Topics Concern   Not on  file  Social History Narrative   Married.   1 child.    Working at [employer]	[place] at [employer].     Enjoys swimming, camping   Social Drivers of Health   Financial Resource Strain: Low Risk  (06/13/2023)   Overall Financial Resource Strain (CARDIA)    Difficulty of Paying Living Expenses: Not hard at all  Food Insecurity: No Food Insecurity (06/13/2023)   Hunger Vital Sign    Worried About Running Out of Food in the Last Year: Never true    Ran Out of Food in the Last Year: Never true  Transportation Needs: No Transportation Needs (06/13/2023)   PRAPARE - Administrator, Civil Service (Medical): No    Lack of Transportation (Non-Medical): No  Physical Activity: Unknown (06/13/2023)    Exercise Vital Sign    Days of Exercise per Week: 5 days    Minutes of Exercise per Session: Patient declined  Stress: No Stress Concern Present (06/13/2023)   [employer] of Occupational Health - Occupational Stress Questionnaire    Feeling of Stress : Not at all  Social Connections: Moderately Isolated (06/13/2023)   Social Connection and Isolation Panel    Frequency of Communication with Friends and Family: More than three times a week    Frequency of Social Gatherings with Friends and Family: Twice a week    Attends Religious Services: Never    Database administrator or Organizations: No    Attends Engineer, structural: Not on file    Marital Status: Married  Catering manager Violence: Not At Risk (04/12/2022)   Humiliation, Afraid, Rape, and Kick questionnaire    Fear of Current or Ex-Partner: No    Emotionally Abused: No    Physically Abused: No    Sexually Abused: No    Past Surgical History:  Procedure Laterality Date   ANTERIOR AND POSTERIOR REPAIR WITH SACROSPINOUS FIXATION N/A 03/13/2018   Procedure: ANTERIOR REPAIR;  Surgeon: Arloa Lamar SQUIBB, MD;  Location: [facility];  Service: Gynecology;  Laterality: N/A;   ESOPHAGOGASTRODUODENOSCOPY (EGD) WITH PROPOFOL  N/A 03/27/2022   Procedure: ESOPHAGOGASTRODUODENOSCOPY (EGD) WITH PROPOFOL ;  Surgeon: Maryruth Ole DASEN, MD;  Location: [facility];  Service: Endoscopy;  Laterality: N/A;   ESOPHAGOGASTRODUODENOSCOPY (EGD) WITH PROPOFOL  N/A 04/12/2022   Procedure: ESOPHAGOGASTRODUODENOSCOPY (EGD) WITH PROPOFOL ;  Surgeon: Unk Corinn Skiff, MD;  Location: [facility];  Service: Gastroenterology;  Laterality: N/A;   INDUCED ABORTION     x2   IR CM INJ ANY COLONIC TUBE W/FLUORO  09/29/2021   LAPAROTOMY N/A 09/13/2021   Procedure: EXPLORATORY LAPAROTOMY-Graham PATCH repair;  Surgeon: Tye Millet, DO;  Location: [facility];  Service: General;  Laterality: N/A;   LAPAROTOMY N/A 09/17/2021   Procedure: EXPLORATORY  LAPAROTOMY;  Surgeon: Desiderio Schanz, MD;  Location: [facility];  Service: General;  Laterality: N/A;   REPAIR OF PERFORATED ULCER N/A 09/17/2021   Procedure: REPAIR OF PERFORATED ULCER;  Surgeon: Desiderio Schanz, MD;  Location: [facility];  Service: General;  Laterality: N/A;   VAGINAL HYSTERECTOMY N/A 03/13/2018   Procedure: HYSTERECTOMY VAGINAL;  Surgeon: Arloa Lamar SQUIBB, MD;  Location: [facility];  Service: Gynecology;  Laterality: N/A;    Family History  Problem Relation Age of Onset   Arthritis Mother    Arthritis Father    Alcohol abuse Father    Hypertension Father    Heart disease Father    Aneurysm Father    Breast cancer Maternal Grandmother    Breast cancer Paternal Grandmother  Allergies  Allergen Reactions   Anesthesia S-I-40 [Propofol ]     Lethargic after med use   Bee Venom Swelling   Codeine Swelling   Bupropion Anxiety and Other (See Comments)    Worsened anxiety    Current Outpatient Medications on File Prior to Visit  Medication Sig Dispense Refill   cyclobenzaprine  (FLEXERIL ) 5 MG tablet Take 5 mg by mouth 2 (two) times daily as needed.     EPINEPHrine  (EPIPEN  2-PAK) 0.3 mg/0.3 mL IJ SOAJ injection Inject 0.3 mg into the muscle as needed for anaphylaxis. 2 each 0   gabapentin  (NEURONTIN ) 100 MG capsule Take 2 capsules (200 mg total) by mouth at bedtime. 180 capsule 1   pantoprazole  (PROTONIX ) 40 MG tablet Take 40 mg by mouth daily.     tiZANidine  (ZANAFLEX ) 4 MG tablet Take 1 tablet (4 mg total) by mouth at bedtime as needed for muscle spasms. 30 tablet 2   predniSONE  (DELTASONE ) 20 MG tablet 2 tabs po daily for 5 days, then 1 tab po daily for 5 days 15 tablet 0   No current facility-administered medications on file prior to visit.    BP (!) 162/88   Pulse 88   Temp 97.9 F (36.6 C) (Oral)   Ht 5' 2 (1.575 m)   Wt 122 lb (55.3 kg)   LMP 02/24/2018 (Approximate) Comment: spotting   SpO2 99%   BMI 22.31 kg/m  Objective:   Physical  Exam  Cardiovascular:     Rate and Rhythm: Normal rate and regular rhythm.  Pulmonary:     Effort: Pulmonary effort is normal.     Breath sounds: Normal breath sounds.   Musculoskeletal:     Right shoulder: Normal range of motion. Normal strength.     Left shoulder: Normal range of motion. Normal strength.     Cervical back: Neck supple.     Thoracic back: Spasms present. No tenderness or bony tenderness. Normal range of motion.   Skin:    General: Skin is warm and dry.   Neurological:     Mental Status: She is alert and oriented to person, place, and time.   Psychiatric:        Mood and Affect: Mood normal.           Assessment & Plan:  Chronic left-sided thoracic back pain Assessment & Plan: Seems muscular based on HPI and exam today. Reviewed x-ray of the thoracic spine from May 2025.  Stop cyclobenzaprine  as this has been ineffective.  Start tizanidine  4 mg as needed. Start gabapentin  100 to 300 mg at bedtime, drowsiness precautions provided.  She will call to set up with physical therapy. We discussed a CT scan of the thoracic spine.  She would like to try physical therapy first.  Her symptoms do seem more muscular based on exam and HPI. We also discussed massage therapy.  She will update.         Halford Goetzke K Psalm Arman, NP

## 2023-12-31 NOTE — Patient Instructions (Signed)
 Stop taking cyclobenzaprine  muscle relaxer.  Try taking tizanidine  muscle relaxer as needed.  Start gabapentin  100 to 300 mg at bedtime (1-3 pills) for pain and sleep.  This will cause drowsiness.  Call to schedule physical therapy.   It was a pleasure to see you today!

## 2023-12-31 NOTE — Assessment & Plan Note (Signed)
 Seems muscular based on HPI and exam today. Reviewed x-ray of the thoracic spine from May 2025.  Stop cyclobenzaprine  as this has been ineffective.  Start tizanidine  4 mg as needed. Start gabapentin  100 to 300 mg at bedtime, drowsiness precautions provided.  She will call to set up with physical therapy. We discussed a CT scan of the thoracic spine.  She would like to try physical therapy first.  Her symptoms do seem more muscular based on exam and HPI. We also discussed massage therapy.  She will update.

## 2024-01-07 ENCOUNTER — Ambulatory Visit: Payer: Self-pay

## 2024-01-07 NOTE — Telephone Encounter (Signed)
 Appointment scheduled with Dr. Watt 01/08/2024.

## 2024-01-07 NOTE — Progress Notes (Unsigned)
     Dartanian Knaggs T. Pride Gonzales, MD, CAQ Sports Medicine [facility] at [facility] [address] [place], [address]  Phone: [phone]  FAX: [phone]  SHAMS FILL - 53 y.o. female  MRN [id number]  Date of Birth: 30-Jan-1971  Date: 01/08/2024  PCP: Gretta Comer POUR, NP  Referral: Gretta Comer POUR, NP  No chief complaint on file.  Subjective:   COLBIE SLIKER is a 53 y.o. very pleasant female patient with There is no height or weight on file to calculate BMI. who presents with the following:  Darina is a patient who I have known for many years who presents for evaluation of some ongoing shoulder pain.  She has been having some issues with her shoulder blade and she recently saw Dr. Gretta for evaluation of the left side thoracic back pain.    Review of Systems is noted in the HPI, as appropriate  Objective:   LMP 02/24/2018 (Approximate) Comment: spotting   GEN: No acute distress; alert,appropriate. PULM: Breathing comfortably in no respiratory distress PSYCH: Normally interactive.   Laboratory and Imaging Data:  Assessment and Plan:   ***

## 2024-01-07 NOTE — Telephone Encounter (Signed)
 FYI Only or Action Required?: FYI only for provider.  Patient was last seen in primary care on 12/31/2023 by Gretta Comer POUR, NP.  Called Nurse Triage reporting Shoulder Pain.  Symptoms began several weeks ago.  Interventions attempted: OTC medications: tylenol , lidocaine  patches and Rest, hydration, or home remedies.  Symptoms are: unchanged.  Triage Disposition: See PCP When Office is Open (Within 3 Days)-has an appointment with Dr. Ubaldo tomorrow.   Patient/caregiver understands and will follow disposition?: Yes  Copied from CRM [id number]. Topic: Clinical - Red Word Triage >> Jan 07, 2024 10:59 AM Maria [name] wrote: Red Word that prompted transfer to Nurse Triage: Patient having right shoulder blade pain Reason for Disposition  [1] MODERATE pain (e.g., interferes with normal activities) AND [2] present > 3 days  Answer Assessment - Initial Assessment Questions 1. ONSET: When did the pain start?     Several weeks 2. LOCATION: Where is the pain located?     Left shoulder blade pain 3. PAIN: How bad is the pain? (Scale 1-10; or mild, moderate, severe)   - MILD (1-3): doesn't interfere with normal activities   - MODERATE (4-7): interferes with normal activities (e.g., work or school) or awakens from sleep   - SEVERE (8-10): excruciating pain, unable to do any normal activities, unable to move arm at all due to pain     8 weeks 4. WORK OR EXERCISE: Has there been any recent work or exercise that involved this part of the body?     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 CAUSE: What do you think is causing the shoulder pain?     work 6. OTHER SYMPTOMS: Do you have any other symptoms? (e.g., neck pain, swelling, rash, fever, numbness, weakness)     No other symptoms  Patient asked about another steroid injection stating that her last appointment with Dr. Ubaldo she was able to receive a steroid injection. Patient states the last injection seemed to help relieve pain.  Patient instructed to follow up with Dr.Copeland at her appointment tomorrow.  Protocols used: Shoulder Pain-A-AH

## 2024-01-08 ENCOUNTER — Encounter: Payer: Self-pay | Admitting: Family Medicine

## 2024-01-08 ENCOUNTER — Ambulatory Visit: Admitting: Family Medicine

## 2024-01-08 VITALS — BP 150/80 | HR 73 | Temp 98.6°F | Ht 62.0 in | Wt 128.4 lb

## 2024-01-08 DIAGNOSIS — G8929 Other chronic pain: Secondary | ICD-10-CM

## 2024-01-08 DIAGNOSIS — M25512 Pain in left shoulder: Secondary | ICD-10-CM

## 2024-01-08 DIAGNOSIS — M546 Pain in thoracic spine: Secondary | ICD-10-CM

## 2024-01-08 MED ORDER — PREGABALIN 75 MG PO CAPS: 75.0000 mg | ORAL_CAPSULE | Freq: Two times a day (BID) | ORAL | 2 refills | Status: DC

## 2024-01-13 ENCOUNTER — Ambulatory Visit: Admitting: Family Medicine

## 2024-01-23 ENCOUNTER — Telehealth: Payer: Self-pay | Admitting: Primary Care

## 2024-01-23 DIAGNOSIS — S29019D Strain of muscle and tendon of unspecified wall of thorax, subsequent encounter: Secondary | ICD-10-CM | POA: Diagnosis not present

## 2024-01-23 DIAGNOSIS — M546 Pain in thoracic spine: Secondary | ICD-10-CM | POA: Diagnosis not present

## 2024-01-23 DIAGNOSIS — G8929 Other chronic pain: Secondary | ICD-10-CM

## 2024-01-23 DIAGNOSIS — M25512 Pain in left shoulder: Secondary | ICD-10-CM

## 2024-01-23 DIAGNOSIS — G2589 Other specified extrapyramidal and movement disorders: Secondary | ICD-10-CM

## 2024-01-23 NOTE — Telephone Encounter (Signed)
 Copied from CRM #[id number]. Topic: Referral - Request for Referral >> Jan 23, 2024  3:43 PM Mesmerise C wrote: Did the patient discuss referral with their provider in the last year? Yes (If No - schedule appointment) (If Yes - send message)  Appointment offered? No  Type of order/referral and detailed reason for visit: Physical Therapy pain in left shoulder and back  Preference of office, provider, location: [facility] [address] [place]  If referral order, have you been seen by this specialty before? No (If Yes, this issue or another issue? When? Where?  Can we respond through MyChart? Yes

## 2024-01-24 DIAGNOSIS — G5601 Carpal tunnel syndrome, right upper limb: Secondary | ICD-10-CM | POA: Diagnosis not present

## 2024-01-24 NOTE — Addendum Note (Signed)
 Addended by: Melrose Kearse K on: 01/24/2024 08:36 AM   Modules accepted: Orders

## 2024-01-24 NOTE — Telephone Encounter (Signed)
 Noted, referral placed.

## 2024-01-31 ENCOUNTER — Encounter

## 2024-02-04 ENCOUNTER — Ambulatory Visit: Payer: Self-pay

## 2024-02-04 DIAGNOSIS — L299 Pruritus, unspecified: Secondary | ICD-10-CM

## 2024-02-04 MED ORDER — HYDROXYZINE HCL 10 MG PO TABS: 10.0000 mg | ORAL_TABLET | Freq: Three times a day (TID) | ORAL | 0 refills | Status: DC | PRN

## 2024-02-04 NOTE — Telephone Encounter (Signed)
 FYI Only or Action Required?: FYI only for provider.  Patient was last seen in primary care on 01/08/2024 by Watt Mirza, MD.  Called Nurse Triage reporting Rash.  Symptoms began yesterday.  Interventions attempted: OTC medications: Benadryl , Hydrocortisone cream and Rest, hydration, or home remedies.  Symptoms are: stable.  Triage Disposition: Home Care  Patient/caregiver understands and will follow disposition?: Yes  Copied from CRM #[id number]. Topic: Clinical - Red Word Triage >> Feb 04, 2024 11:57 AM Frederich PARAS wrote: [facility] that prompted transfer to Nurse Triage: allergic reaction PT Calling adv she has had allergic reactions i past, she is having a allergic reaction , no swelling , just bumpy blisters , itchiness Reason for Disposition  Mild localized rash  Answer Assessment - Initial Assessment Questions 1. APPEARANCE of RASH: What does the rash look like? (e.g., blisters, dry flaky skin, red spots, redness, sores)     Small red blisters 2. LOCATION: Where is the rash located?      Right forearm 3. NUMBER: How many spots are there?      numerous 4. SIZE: How big are the spots? (e.g., inches, cm; or compare to size of pinhead, tip of pen, eraser, pea)      End of a dull pencil 5. ONSET: When did the rash start?      Yesterday afternoon 6. ITCHING: Does the rash itch? If Yes, ask: How bad is the itch?  (Scale 0-10; or none, mild, moderate, severe)     Yes-mild itching 7. PAIN: Does the rash hurt? If Yes, ask: How bad is the pain?  (Scale 0-10; or none, mild, moderate, severe)     no 8. OTHER SYMPTOMS: Do you have any other symptoms? (e.g., fever)     No  Instructions given for patient to care for rash at home. Patient states she will try OTC remedies. Will call back if she needs an appointment.  Protocols used: Rash or Redness - Localized-A-AH

## 2024-02-04 NOTE — Telephone Encounter (Signed)
 Noted

## 2024-02-06 ENCOUNTER — Other Ambulatory Visit: Payer: Self-pay | Admitting: Family Medicine

## 2024-02-06 NOTE — Telephone Encounter (Signed)
 Last office visit 01/08/24 with Dr. Watt for shoulder pain.  Last refilled 11/14/23 for #30 with 2 refills.  Next Appt: No future appointments.

## 2024-02-25 ENCOUNTER — Ambulatory Visit: Admitting: Family Medicine

## 2024-02-25 VITALS — BP 190/88 | HR 80 | Temp 98.2°F | Ht 62.0 in | Wt 129.4 lb

## 2024-02-25 DIAGNOSIS — F330 Major depressive disorder, recurrent, mild: Secondary | ICD-10-CM

## 2024-02-25 MED ORDER — SERTRALINE HCL 50 MG PO TABS: 50.0000 mg | ORAL_TABLET | Freq: Every day | ORAL | 3 refills | Status: DC

## 2024-02-25 NOTE — Progress Notes (Signed)
 Patient ID: Maria [name], female    DOB: January 26, 1971, 53 y.o.   MRN: [id number]  This visit was conducted in person.  BP (!) 190/88   Pulse 80   Temp 98.2 F (36.8 C) (Temporal)   Ht 5' 2 (1.575 m)   Wt 129 lb 6 oz (58.7 kg)   LMP 02/24/2018 (Approximate) Comment: spotting   SpO2 99%   BMI 23.66 kg/m    CC:  Chief Complaint  Patient presents with   Depression    Subjective:   HPI: Maria [name] is a 53 y.o. female pt of Mallie Gaskins, NP presenting on 02/25/2024 for Depression   New onset  depressive symptoms in last 3 months.  Felling constantly down, decreased motivation.  Feels like there is nothing to do except work.  Increase stress at work.  No issues falling asleep, occ issues staying asleep.   No depression or anxiety.  He mom and son is a good support for her.  Good energy overall.       02/25/2024    3:54 PM 12/31/2023   11:49 AM 11/07/2023   12:09 PM  Depression screen PHQ 2/9  Decreased Interest 1 1 2   Down, Depressed, Hopeless 1 1 1   PHQ - 2 Score 2 2 3   Altered sleeping 1 2 2   Tired, decreased energy 0 2 2  Change in appetite 1 2 0  Feeling bad or failure about yourself  1 1 0  Trouble concentrating 0 0 2  Moving slowly or fidgety/restless 0 1 1  Suicidal thoughts 0 0 0  PHQ-9 Score 5 10 10   Difficult doing work/chores Somewhat difficult Somewhat difficult Somewhat difficult       02/25/2024    3:56 PM 12/31/2023   11:50 AM 11/07/2023   12:09 PM 06/14/2023    9:56 AM  GAD 7 : Generalized Anxiety Score  Nervous, Anxious, on Edge 1 1 1  0  Control/stop worrying 1 1 1  0  Worry too much - different things 1 1 1  0  Trouble relaxing 1 1 1 1   Restless 1 1 3  0  Easily annoyed or irritable 0 1 1 0  Afraid - awful might happen 0 1 0 1  Total GAD 7 Score 5 7 8 2   Anxiety Difficulty Somewhat difficult Not difficult at all Very difficult Somewhat difficult   BP Readings from Last 3 Encounters:  02/25/24 (!) 190/88  01/08/24 (!) 150/80  12/31/23  (!) 162/88      Relevant past medical, surgical, family and social history reviewed and updated as indicated. Interim medical history since our last visit reviewed. Allergies and medications reviewed and updated. Outpatient Medications Prior to Visit  Medication Sig Dispense Refill   EPINEPHrine  (EPIPEN  2-PAK) 0.3 mg/0.3 mL IJ SOAJ injection Inject 0.3 mg into the muscle as needed for anaphylaxis. 2 each 0   gabapentin  (NEURONTIN ) 100 MG capsule Take 200 mg by mouth at bedtime.     hydrOXYzine  (ATARAX ) 10 MG tablet Take 1 tablet (10 mg total) by mouth 3 (three) times daily as needed for itching. 30 tablet 0   pregabalin  (LYRICA ) 75 MG capsule Take 1 capsule (75 mg total) by mouth 2 (two) times daily. 60 capsule 2   tiZANidine  (ZANAFLEX ) 4 MG tablet TAKE 1 TABLET BY MOUTH AT BEDTIME AS NEEDED FOR MUSCLE SPASMS. 30 tablet 2   pantoprazole  (PROTONIX ) 40 MG tablet Take 40 mg by mouth daily.     No facility-administered medications prior  to visit.     Per HPI unless specifically indicated in ROS section below Review of Systems  Constitutional:  Negative for fatigue and fever.  HENT:  Negative for congestion.   Eyes:  Negative for pain.  Respiratory:  Negative for cough and shortness of breath.   Cardiovascular:  Negative for chest pain, palpitations and leg swelling.  Gastrointestinal:  Negative for abdominal pain.  Genitourinary:  Negative for dysuria and vaginal bleeding.  Musculoskeletal:  Negative for back pain.  Neurological:  Negative for syncope, light-headedness and headaches.  Psychiatric/Behavioral:  Positive for dysphoric mood.    Objective:  BP (!) 190/88   Pulse 80   Temp 98.2 F (36.8 C) (Temporal)   Ht 5' 2 (1.575 m)   Wt 129 lb 6 oz (58.7 kg)   LMP 02/24/2018 (Approximate) Comment: spotting   SpO2 99%   BMI 23.66 kg/m   Wt Readings from Last 3 Encounters:  02/25/24 129 lb 6 oz (58.7 kg)  01/08/24 128 lb 6 oz (58.2 kg)  12/31/23 122 lb (55.3 kg)       Physical Exam Constitutional:      General: She is not in acute distress.    Appearance: Normal appearance. She is well-developed. She is not ill-appearing or toxic-appearing.  HENT:     Head: Normocephalic.     Right Ear: Hearing, tympanic membrane, ear canal and external ear normal. Tympanic membrane is not erythematous, retracted or bulging.     Left Ear: Hearing, tympanic membrane, ear canal and external ear normal. Tympanic membrane is not erythematous, retracted or bulging.     Nose: No mucosal edema or rhinorrhea.     Right Sinus: No maxillary sinus tenderness or frontal sinus tenderness.     Left Sinus: No maxillary sinus tenderness or frontal sinus tenderness.     Mouth/Throat:     Pharynx: Uvula midline.  Eyes:     General: Lids are normal. Lids are everted, no foreign bodies appreciated.     Conjunctiva/sclera: Conjunctivae normal.     Pupils: Pupils are equal, round, and reactive to light.  Neck:     Thyroid : No thyroid  mass or thyromegaly.     Vascular: No carotid bruit.     Trachea: Trachea normal.  Cardiovascular:     Rate and Rhythm: Normal rate and regular rhythm.     Pulses: Normal pulses.     Heart sounds: Normal heart sounds, S1 normal and S2 normal. No murmur heard.    No friction rub. No gallop.  Pulmonary:     Effort: Pulmonary effort is normal. No tachypnea or respiratory distress.     Breath sounds: Normal breath sounds. No decreased breath sounds, wheezing, rhonchi or rales.  Abdominal:     General: Bowel sounds are normal.     Palpations: Abdomen is soft.     Tenderness: There is no abdominal tenderness.  Musculoskeletal:     Cervical back: Normal range of motion and neck supple.  Skin:    General: Skin is warm and dry.     Findings: No rash.  Neurological:     Mental Status: She is alert.  Psychiatric:        Mood and Affect: Mood is not anxious or depressed.        Speech: Speech normal.        Behavior: Behavior normal. Behavior is  cooperative.        Thought Content: Thought content normal.        Judgment:  Judgment normal.       Results for orders placed or performed in visit on 08/13/23  Vitamin D  (25 hydroxy)   Collection Time: 08/13/23 11:40 AM  Result Value Ref Range   VITD 16.51 (L) 30.00 - 100.00 ng/mL  Vitamin B12   Collection Time: 08/13/23 11:40 AM  Result Value Ref Range   Vitamin B-12 741 211 - 911 pg/mL    Assessment and Plan  MDD (major depressive disorder), recurrent episode, mild (HCC) Assessment & Plan: New, uncontrolled Recommended initiation of sertraline  50 mg p.o. nightly.  Reviewed course of treatment, expected duration of medication and needed follow-up.  Also offered counseling but patient is not interested in this time.  Follow-up in 4 weeks with PCP for reevaluation.  Return and ER precautions provided.   Other orders -     Sertraline  HCl; Take 1 tablet (50 mg total) by mouth daily.  Dispense: 30 tablet; Refill: 3    Return in about 4 weeks (around 03/24/2024) for  follow up mood and BP check.   Greig Ring, MD

## 2024-02-25 NOTE — Patient Instructions (Addendum)
 Follow BP at home.cancer all if persistently > 140/90.  Start sertraline  at bedtime.  Stop por decrease alcohol.  Call if interested in referral to counseling.

## 2024-03-09 DIAGNOSIS — F330 Major depressive disorder, recurrent, mild: Secondary | ICD-10-CM | POA: Insufficient documentation

## 2024-03-09 NOTE — Assessment & Plan Note (Signed)
 New, uncontrolled Recommended initiation of sertraline  50 mg p.o. nightly.  Reviewed course of treatment, expected duration of medication and needed follow-up.  Also offered counseling but patient is not interested in this time.  Follow-up in 4 weeks with PCP for reevaluation.  Return and ER precautions provided.

## 2024-03-16 NOTE — Progress Notes (Unsigned)
     Alain Deschene T. Bryndle Corredor, MD, CAQ Sports Medicine [facility] [address] [place], [address]  Phone: [phone]  FAX: [phone]  Maria [name] - 53 y.o. female  MRN [id number]  Date of Birth: 18-Mar-1971  Date: 03/19/2024  PCP: Gretta Comer POUR, NP  Referral: Gretta Comer POUR, NP  No chief complaint on file.  Subjective:   Maria [name] is a 53 y.o. very pleasant female patient with There is no height or weight on file to calculate BMI. who presents with the following:  Discussed the use of AI scribe software for clinical note transcription with the patient, who gave verbal consent to proceed.  Patient presents with left-sided shoulder, neck, and back pain.  Last time I was here, stopped her gabapentin  and started her on Lyrica  75 mg p.o. twice daily. She has avoided NSAIDs due to history of ulcer. History of Present Illness     Review of Systems is noted in the HPI, as appropriate  Objective:   LMP 02/24/2018 (Approximate) Comment: spotting   GEN: No acute distress; alert,appropriate. PULM: Breathing comfortably in no respiratory distress PSYCH: Normally interactive.   Physical Exam   Laboratory and Imaging Data:  Assessment and Plan:   No diagnosis found. Assessment & Plan   Medication Management during today's office visit: No orders of the defined types were placed in this encounter.  There are no discontinued medications.  Orders placed today for conditions managed today: No orders of the defined types were placed in this encounter.   Disposition: No follow-ups on file.  [employer] One speech-to-text software was used for transcription in this dictation.  Possible transcriptional errors can occur using Animal nutritionist.   Signed,  Jacques DASEN. Shunda Rabadi, MD   Outpatient Encounter Medications as of 03/19/2024  Medication Sig   EPINEPHrine  (EPIPEN  2-PAK) 0.3 mg/0.3 mL IJ SOAJ injection Inject 0.3  mg into the muscle as needed for anaphylaxis.   gabapentin  (NEURONTIN ) 100 MG capsule Take 200 mg by mouth at bedtime.   hydrOXYzine  (ATARAX ) 10 MG tablet Take 1 tablet (10 mg total) by mouth 3 (three) times daily as needed for itching.   pregabalin  (LYRICA ) 75 MG capsule Take 1 capsule (75 mg total) by mouth 2 (two) times daily.   sertraline  (ZOLOFT ) 50 MG tablet Take 1 tablet (50 mg total) by mouth daily.   tiZANidine  (ZANAFLEX ) 4 MG tablet TAKE 1 TABLET BY MOUTH AT BEDTIME AS NEEDED FOR MUSCLE SPASMS.   No facility-administered encounter medications on file as of 03/19/2024.

## 2024-03-17 ENCOUNTER — Ambulatory Visit: Payer: Self-pay

## 2024-03-17 NOTE — Telephone Encounter (Signed)
 Jacques, this patient has been seen multiple times by me for the same problem. She's also seen you as well.  Both you and I have placed referrals for physical therapy previously, and I placed a referral to orthopedics in July 2025.  It looks like she may have completed 2 episodes of PT thus far. Just Fyi.

## 2024-03-17 NOTE — Telephone Encounter (Signed)
 FYI Only or Action Required?: FYI only for provider.  Patient was last seen in primary care on 02/25/2024 by Avelina Greig BRAVO, MD.  Called Nurse Triage reporting Back Pain and Shoulder Pain.  Symptoms began several months ago.  Interventions attempted: OTC medications: tylenol  and Prescription medications: tizanadine.  Symptoms are: gradually worsening.  Triage Disposition: See HCP Within 4 Hours (Or PCP Triage)  Patient/caregiver understands and will follow disposition?: No, refuses disposition     Copied from CRM [id number]. Topic: Clinical - Red Word Triage >> Mar 17, 2024  9:16 AM Mesmerise C wrote: [facility] that prompted transfer to Nurse Triage: Patient states her back pain has been getting worse and it's on her left side in her shoulder area Reason for Disposition  [1] SEVERE back pain (e.g., excruciating, unable to do any normal activities) AND [2] not improved 2 hours after pain medicine  Answer Assessment - Initial Assessment Questions Advised exam in next 4 hours, no PCP or region availability, pt opting to keep 9/18 appt due to office/personal work schedule. Advised pt call back if worsening or head to ED if very severe pain.   Pt requesting earlier appt than 9/18 since worsening pain 1. ONSET: When did the pain begin? (e.g., minutes, hours, days)     Months ago 2. LOCATION: Where does it hurt? (upper, mid or lower back)     Upper back left side right near shoulder blade 3. SEVERITY: How bad is the pain?  (e.g., Scale 1-10; mild, moderate, or severe)     When muscle starts to spasm it's a solid 9/10 Feels like knotting up in a ball, can be working, sitting, or sleeping and it'll just catch 4. PATTERN: Is the pain constant? (e.g., yes, no; constant, intermittent)      Depends, sometimes stays constant for 1-2 min then other times just grabs, then if rotate arm or move around then it starts easing up little bit 5. RADIATION: Does the pain shoot into your legs or  somewhere else?     No chest, arm, neck pain, not into lower back or legs 6. CAUSE:  What do you think is causing the back pain?      No injury that know of, been going on for several months Seen doc, described it as muscle spasms, went to ortho for PT and sent home with exercises that seemed to help momentarily 7. BACK OVERUSE:  Any recent lifting of heavy objects, strenuous work or exercise?     No more so than normally do 8. MEDICINES: What have you taken so far for the pain? 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 NEUROLOGIC SYMPTOMS: Do you have any weakness, numbness, or problems with bowel/bladder control?     No weakness, numbness/tingling No changes to bowel/bladder control 10. OTHER SYMPTOMS: Do you have any other symptoms? (e.g., fever, abdomen pain, burning with urination, blood in urine)       Denies other symptoms Everything else seems to be okay except for that one spot  Protocols used: Back Pain-A-AH

## 2024-03-19 ENCOUNTER — Other Ambulatory Visit: Payer: Self-pay | Admitting: Family Medicine

## 2024-03-19 ENCOUNTER — Encounter: Payer: Self-pay | Admitting: Family Medicine

## 2024-03-19 ENCOUNTER — Ambulatory Visit: Admitting: Family Medicine

## 2024-03-19 VITALS — BP 90/60 | HR 65 | Temp 97.4°F | Ht 62.0 in | Wt 126.2 lb

## 2024-03-19 DIAGNOSIS — G8929 Other chronic pain: Secondary | ICD-10-CM

## 2024-03-19 DIAGNOSIS — M546 Pain in thoracic spine: Secondary | ICD-10-CM | POA: Diagnosis not present

## 2024-03-19 DIAGNOSIS — M25512 Pain in left shoulder: Secondary | ICD-10-CM

## 2024-03-19 MED ORDER — PREDNISONE 20 MG PO TABS: ORAL_TABLET | ORAL | 0 refills | Status: DC

## 2024-03-19 NOTE — Patient Instructions (Signed)
 Start Lyrica  (Pregabalin ) and do not take it at the same time as Gabapentin 

## 2024-03-20 ENCOUNTER — Emergency Department

## 2024-03-20 ENCOUNTER — Other Ambulatory Visit: Payer: Self-pay

## 2024-03-20 ENCOUNTER — Observation Stay
Admission: EM | Admit: 2024-03-20 | Discharge: 2024-03-20 | Disposition: A | Discharge status: Home / Self Care | Attending: Internal Medicine | Admitting: Internal Medicine

## 2024-03-20 DIAGNOSIS — R112 Nausea with vomiting, unspecified: Secondary | ICD-10-CM | POA: Diagnosis not present

## 2024-03-20 DIAGNOSIS — G609 Hereditary and idiopathic neuropathy, unspecified: Secondary | ICD-10-CM | POA: Diagnosis not present

## 2024-03-20 DIAGNOSIS — F1090 Alcohol use, unspecified, uncomplicated: Secondary | ICD-10-CM | POA: Insufficient documentation

## 2024-03-20 DIAGNOSIS — K8689 Other specified diseases of pancreas: Secondary | ICD-10-CM | POA: Diagnosis not present

## 2024-03-20 DIAGNOSIS — R252 Cramp and spasm: Secondary | ICD-10-CM | POA: Insufficient documentation

## 2024-03-20 DIAGNOSIS — F330 Major depressive disorder, recurrent, mild: Secondary | ICD-10-CM | POA: Diagnosis present

## 2024-03-20 DIAGNOSIS — G629 Polyneuropathy, unspecified: Secondary | ICD-10-CM

## 2024-03-20 DIAGNOSIS — R1013 Epigastric pain: Principal | ICD-10-CM

## 2024-03-20 DIAGNOSIS — I10 Essential (primary) hypertension: Secondary | ICD-10-CM | POA: Insufficient documentation

## 2024-03-20 DIAGNOSIS — Z90710 Acquired absence of both cervix and uterus: Secondary | ICD-10-CM | POA: Diagnosis not present

## 2024-03-20 DIAGNOSIS — F32A Depression, unspecified: Secondary | ICD-10-CM | POA: Insufficient documentation

## 2024-03-20 DIAGNOSIS — F339 Major depressive disorder, recurrent, unspecified: Secondary | ICD-10-CM | POA: Diagnosis present

## 2024-03-20 DIAGNOSIS — M549 Dorsalgia, unspecified: Secondary | ICD-10-CM | POA: Insufficient documentation

## 2024-03-20 DIAGNOSIS — Z87891 Personal history of nicotine dependence: Secondary | ICD-10-CM | POA: Insufficient documentation

## 2024-03-20 DIAGNOSIS — K279 Peptic ulcer, site unspecified, unspecified as acute or chronic, without hemorrhage or perforation: Secondary | ICD-10-CM | POA: Insufficient documentation

## 2024-03-20 DIAGNOSIS — F320 Major depressive disorder, single episode, mild: Secondary | ICD-10-CM | POA: Insufficient documentation

## 2024-03-20 LAB — CBC
HCT: 39.9 % (ref 36.0–46.0)
Hemoglobin: 13.4 g/dL (ref 12.0–15.0)
MCH: 34.7 pg — ABNORMAL HIGH (ref 26.0–34.0)
MCHC: 33.6 g/dL (ref 30.0–36.0)
MCV: 103.4 fL — ABNORMAL HIGH (ref 80.0–100.0)
Platelets: 162 K/uL (ref 150–400)
RBC: 3.86 MIL/uL — ABNORMAL LOW (ref 3.87–5.11)
RDW: 14.5 % (ref 11.5–15.5)
WBC: 9.2 K/uL (ref 4.0–10.5)
nRBC: 0 % (ref 0.0–0.2)

## 2024-03-20 LAB — COMPREHENSIVE METABOLIC PANEL WITH GFR
ALT: 14 U/L (ref 0–44)
AST: 31 U/L (ref 15–41)
Albumin: 4.6 g/dL (ref 3.5–5.0)
Alkaline Phosphatase: 78 U/L (ref 38–126)
Anion gap: 15 (ref 5–15)
BUN: 16 mg/dL (ref 6–20)
CO2: 19 mmol/L — ABNORMAL LOW (ref 22–32)
Calcium: 10.1 mg/dL (ref 8.9–10.3)
Chloride: 109 mmol/L (ref 98–111)
Creatinine, Ser: 1.04 mg/dL — ABNORMAL HIGH (ref 0.44–1.00)
GFR, Estimated: >60 mL/min (ref >60)
Glucose, Bld: 131 mg/dL — ABNORMAL HIGH (ref 70–99)
Potassium: 4.1 mmol/L (ref 3.5–5.1)
Sodium: 143 mmol/L (ref 135–145)
Total Bilirubin: 1.3 mg/dL — ABNORMAL HIGH (ref 0.0–1.2)
Total Protein: 8.1 g/dL (ref 6.5–8.1)

## 2024-03-20 LAB — LIPASE, BLOOD: Lipase: 67 U/L — ABNORMAL HIGH (ref 11–51)

## 2024-03-20 MED ORDER — PREGABALIN 75 MG PO CAPS: 75.0000 mg | ORAL_CAPSULE | Freq: Two times a day (BID) | ORAL | Status: DC

## 2024-03-20 MED ORDER — SODIUM CHLORIDE 0.9 % IV SOLN: 12.5000 mg | Freq: Four times a day (QID) | INTRAVENOUS | Status: DC | PRN

## 2024-03-20 MED ORDER — HYDROMORPHONE HCL 1 MG/ML IJ SOLN
1.0000 mg | Freq: Once | INTRAMUSCULAR | Status: AC
  Administered 2024-03-20: 1 mg via INTRAVENOUS
  Filled 2024-03-20: qty 1

## 2024-03-20 MED ORDER — ONDANSETRON HCL 4 MG/2ML IJ SOLN
4.0000 mg | Freq: Once | INTRAMUSCULAR | Status: AC
  Administered 2024-03-20: 4 mg via INTRAVENOUS
  Filled 2024-03-20: qty 2

## 2024-03-20 MED ORDER — HYDROXYZINE HCL 10 MG PO TABS: 10.0000 mg | ORAL_TABLET | Freq: Three times a day (TID) | ORAL | Status: DC | PRN

## 2024-03-20 MED ORDER — HYDROMORPHONE HCL 1 MG/ML IJ SOLN: 1.0000 mg | INTRAMUSCULAR | Status: DC | PRN

## 2024-03-20 MED ORDER — ACETAMINOPHEN 325 MG PO TABS: 650.0000 mg | ORAL_TABLET | Freq: Four times a day (QID) | ORAL | Status: DC | PRN

## 2024-03-20 MED ORDER — MORPHINE SULFATE (PF) 4 MG/ML IV SOLN: 4.0000 mg | INTRAVENOUS | Status: DC | PRN

## 2024-03-20 MED ORDER — LACTATED RINGERS IV SOLN: INTRAVENOUS | Status: DC

## 2024-03-20 MED ORDER — ONDANSETRON HCL 4 MG/2ML IJ SOLN: 4.0000 mg | Freq: Four times a day (QID) | INTRAMUSCULAR | Status: DC | PRN

## 2024-03-20 MED ORDER — ACETAMINOPHEN 325 MG RE SUPP: 650.0000 mg | Freq: Four times a day (QID) | RECTAL | Status: DC | PRN

## 2024-03-20 MED ORDER — TIZANIDINE HCL 2 MG PO TABS
4.0000 mg | ORAL_TABLET | Freq: Four times a day (QID) | ORAL | Status: DC | PRN
Start: 2024-03-20 — End: 2024-03-20

## 2024-03-20 MED ORDER — SODIUM CHLORIDE 0.9 % IV BOLUS
1000.0000 mL | Freq: Once | INTRAVENOUS | Status: AC
  Administered 2024-03-20: 1000 mL via INTRAVENOUS

## 2024-03-20 MED ORDER — SERTRALINE HCL 50 MG PO TABS
50.0000 mg | ORAL_TABLET | Freq: Every day | ORAL | Status: DC
Start: 2024-03-21 — End: 2024-03-20

## 2024-03-20 MED ORDER — IOHEXOL 300 MG/ML  SOLN
100.0000 mL | Freq: Once | INTRAMUSCULAR | Status: AC | PRN
  Administered 2024-03-20: 100 mL via INTRAVENOUS

## 2024-03-20 MED ORDER — MIDAZOLAM HCL 2 MG/2ML IJ SOLN: 1.0000 mg | INTRAMUSCULAR | Status: DC | PRN

## 2024-03-20 MED ORDER — PANTOPRAZOLE SODIUM 40 MG IV SOLR: 40.0000 mg | Freq: Two times a day (BID) | INTRAVENOUS | Status: DC

## 2024-03-20 MED ORDER — PANTOPRAZOLE SODIUM 40 MG IV SOLR
40.0000 mg | Freq: Once | INTRAVENOUS | Status: AC
  Administered 2024-03-20: 40 mg via INTRAVENOUS
  Filled 2024-03-20: qty 10

## 2024-03-20 MED ORDER — GABAPENTIN 100 MG PO CAPS: 200.0000 mg | ORAL_CAPSULE | Freq: Every day | ORAL | Status: DC

## 2024-03-20 MED ORDER — ONDANSETRON HCL 4 MG PO TABS: 4.0000 mg | ORAL_TABLET | Freq: Four times a day (QID) | ORAL | Status: DC | PRN

## 2024-03-20 NOTE — Assessment & Plan Note (Signed)
 Midazolam  1 mg IV as needed for withdrawal symptoms, 4 doses ordered with instructions to administer as appropriate and then let provider know

## 2024-03-20 NOTE — ED Provider Notes (Signed)
 [facility] Provider Note    Event Date/Time   First MD Initiated Contact with Patient 03/20/24 1507     (approximate)   History   Abdominal Pain   HPI  Maria [name] is a 53 y.o. female who presents to the emergency department today because of concern for abdominal pain.  She states that it started yesterday.  Started suddenly.  Located in the epigastric region.  Does not radiate.  Has been severe.  Has been accompanied by nausea and vomiting of bile-like fluid.  Additionally today she noticed some black stools.  She has history of significant ulcers in the past.  Currently not on any antacids but was recently started on prednisone  for back pain.  She has not had any fevers but has had some chills.     Physical Exam   Triage Vital Signs: ED Triage Vitals [03/20/24 1332]  Encounter Vitals Group     BP (!) 184/83     Girls Systolic BP Percentile      Girls Diastolic BP Percentile      Boys Systolic BP Percentile      Boys Diastolic BP Percentile      Pulse Rate 74     Resp 18     Temp 98 F (36.7 C)     Temp src      SpO2 100 %     Weight 113 lb (51.3 kg)     Height 5' 2 (1.575 m)     Head Circumference      Peak Flow      Pain Score 9     Pain Loc      Pain Education      Exclude from Growth Chart     Most recent vital signs: Vitals:   03/20/24 1352 03/20/24 1442  BP: (!) 188/87 (!) 180/84  Pulse: 73 73  Resp:  17  Temp:    SpO2: 100% 100%    General: Awake, alert, oriented. CV:  Good peripheral perfusion. Regular rate and rhythm. Resp:  Normal effort. Lungs clear Abd:  No distention. Tender to palpation in the epigastric region.   ED Results / Procedures / Treatments   Labs (all labs ordered are listed, but only abnormal results are displayed) Labs Reviewed  LIPASE, BLOOD - Abnormal; Notable for the following components:      Result Value   Lipase 67 (*)    All other components within normal limits  COMPREHENSIVE  METABOLIC PANEL WITH GFR - Abnormal; Notable for the following components:   CO2 19 (*)    Glucose, Bld 131 (*)    Creatinine, Ser 1.04 (*)    Total Bilirubin 1.3 (*)    All other components within normal limits  CBC - Abnormal; Notable for the following components:   RBC 3.86 (*)    MCV 103.4 (*)    MCH 34.7 (*)    All other components within normal limits  URINALYSIS, ROUTINE W REFLEX MICROSCOPIC     EKG  None   RADIOLOGY I independently interpreted and visualized the CT abd/pel. My interpretation: No free air Radiology interpretation: IMPRESSION:  1. Mild to moderate gastric antral wall thickening is noted with  probable ulceration present. Endoscopy is recommended for further  evaluation.  2. Multiple pancreatic calcifications are again noted consistent  with chronic pancreatitis. Stable ductal dilatation is noted which  may be due to multiple ductal calculi in the pancreatic head, the  largest measuring approximate 12  mm.  3. Aortic atherosclerosis.    PROCEDURES:  Critical Care performed: No   MEDICATIONS ORDERED IN ED: Medications - No data to display   IMPRESSION / MDM / ASSESSMENT AND PLAN / ED COURSE  I reviewed the triage vital signs and the nursing notes.                              Differential diagnosis includes, but is not limited to, ulcer disease, perforation, cholecystitis, pancreatitis  Patient's presentation is most consistent with acute presentation with potential threat to life or bodily function.   The patient is on the cardiac monitor to evaluate for evidence of arrhythmia and/or significant heart rate changes.  Patient presented to the emergency department today because concerns for severe epigastric pain that started yesterday.  On exam patient does have tenderness to the epigastric region.  Blood work without concerning leukocytosis.  Would have concerns for ulcerative disease given patient's history.  Given significant pain would have  concern for possible perforation.  Will check CT abdomen pelvis.  Will give pain medication and IV fluids.  CT abd pel concerning for ulcers. No evidence of perforation. Given pain and concern for possible bleed with black and tarry stool will plan on admission. Discussed with Dr. Sherre with the hospitalist service who will evaluate for admission.    FINAL CLINICAL IMPRESSION(S) / ED DIAGNOSES   Final diagnoses:  Epigastric pain  Peptic ulcer        Note:  This document was prepared using Dragon voice recognition software and may include unintentional dictation errors.    Floy Roberts, MD 03/20/24 [phone]

## 2024-03-20 NOTE — H&P (Addendum)
 History and Physical   SAYLOR MURRY FMW:[id number] DOB: [birth year] DOA: 03/20/2024  PCP: Gretta Comer POUR, NP  Patient coming from: Home  I have personally briefly reviewed patient's old medical records in [facility] EMR.  Chief Concern: Abdominal pain, black stool  HPI: [name] is a 53 year old female with history of neuropathy, depression, anxiety, GERD, who presents emergency department for chief concerns of abdominal pain and black stool.  Vitals in the ED showed t 98, rr 18, heart rate 73, blood pressure 188/87, SpO2 100% on room air.  Serum sodium is 143, potassium 4.1, chloride 109, bicarb 19, BUN of 16, serum creatinine 1.04, eGFR greater than 60, nonfasting blood glucose 131, WBC 9.2, hemoglobin 13.4, platelet 162.  Lipase is 67.  ED treatment: Dilaudid  1 mg IV one-time dose, ondansetron  4 mg IV one-time dose, Protonix  40 mg IV one-time dose, sodium chloride  1 L bolus. --------------------------------- At bedside, patient able to tell me her first and last name, location, current calendar year.  She reports that her age is [age].  She states that the year was 2017.  She then corrected and states it is 2023 and then 2024.  Patient reports she has been having memory problems for the last few months.  She reports her abdominal pain started last night, she vomited 2 times yesterday and about 3 times this morning.  She reports her vomitus is mostly yellow.  She reports she noted black stool today.  She reports this is never happened before.  Social history: She lives at home with her husband and dog.  She denies trauma to her person.  She denies tobacco and recreational drug use.  She reports she currently has cut down a lot on her alcohol.  She states she drinks 1-2 drinks (cocktail, beer) every other day.  ROS: Constitutional: no weight change, no fever ENT/Mouth: no sore throat, no rhinorrhea Eyes: no eye pain, no vision changes Cardiovascular: no chest  pain, no dyspnea,  no edema, no palpitations Respiratory: no cough, no sputum, no wheezing Gastrointestinal: + nausea, + vomiting, no diarrhea, no constipation Genitourinary: no urinary incontinence, no dysuria, no hematuria Musculoskeletal: no arthralgias, no myalgias Skin: no skin lesions, no pruritus, Neuro: no weakness, no loss of consciousness, no syncope Psych: no anxiety, no depression, + decrease appetite Heme/Lymph: no bruising, no bleeding  ED Course: Discussed with EDP, patient requiring hospitalization for chief concerns of intractable nausea and vomiting with melena stools.  Assessment/Plan  Principal Problem:   Intractable nausea and vomiting Active Problems:   PUD (peptic ulcer disease)   Alcohol use disorder   Depression, recurrent (HCC)   Neuropathy   Muscle cramping   MDD (major depressive disorder), recurrent episode, mild (HCC)   Assessment and Plan:  * Intractable nausea and vomiting Symptomatic support Ondansetron  4 mg every 6 hours as needed for nausea, vomiting, 5 days ordered; Phenergan  12.5 mg IV every 6 hours as needed for refractory nausea and vomiting, 1 day ordered Status post sodium chloride  1 L bolus per EDP we will continue with lactated Ringer 's infusion at 125 mL/h, 1 day ordered Strict I's and O's Portable chest x-ray ordered  Alcohol use disorder Midazolam  1 mg IV as needed for withdrawal symptoms, 4 doses ordered with instructions to administer as appropriate and then let provider know  PUD (peptic ulcer disease) With black stool Protonix  40 mg IV twice daily initiated on admission Gastroenterology specialist has been consulted Clear liquid diet; n.p.o.  after midnight in anticipation  of possible endoscopy tomorrow  Depression, recurrent (HCC) With anxiety Home Zoloft  50 mg daily resumed  Neuropathy PDMP reviewed Gabapentin  200 mg nightly resumed on admission  Chart reviewed.   DVT prophylaxis: Protonix  40 mg IV twice  daily Code Status: Full code Diet: Liquid diet; n.p.o. at midnight Family Communication: No Disposition Plan: Pending clinical course Consults called: Gastroenterology Admission status: Telemetry medical, observation  Past Medical History:  Diagnosis Date   Acute on chronic blood loss anemia 11/02/2021   Anemia    Anesthesia complication, initial encounter 09/13/2021   Difficulty waking up from Anesthesia requiring re-intubation   Ankle edema, bilateral 11/15/2021   Anxiety    h/o   Depression    h/o   Folate deficiency 07/30/2019   Gastritis 08/19/2020   GERD (gastroesophageal reflux disease)    tums prn   Hypertension    WAS PUT ON BP MED BY PCP LAST YEAR (2018) AND BP MED MADE PT SICK SO SHE STOPPED TAKING IT-PCP MONITORS BP NOW   Palpitations 05/18/2020   Pancreatitis    Peptic ulcer    Perforated peptic ulcer (HCC) 09/14/2021   Upper GI bleed 04/11/2022   Vitamin B12 deficiency 01/17/2018   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   Past Surgical History:  Procedure Laterality Date   ANTERIOR AND POSTERIOR REPAIR WITH SACROSPINOUS FIXATION N/A 03/13/2018   Procedure: ANTERIOR REPAIR;  Surgeon: Arloa Lamar SQUIBB, MD;  Location: [facility];  Service: Gynecology;  Laterality: N/A;   ESOPHAGOGASTRODUODENOSCOPY (EGD) WITH PROPOFOL  N/A 03/27/2022   Procedure: ESOPHAGOGASTRODUODENOSCOPY (EGD) WITH PROPOFOL ;  Surgeon: Maryruth Ole DASEN, MD;  Location: [facility];  Service: Endoscopy;  Laterality: N/A;   ESOPHAGOGASTRODUODENOSCOPY (EGD) WITH PROPOFOL  N/A 04/12/2022   Procedure: ESOPHAGOGASTRODUODENOSCOPY (EGD) WITH PROPOFOL ;  Surgeon: Unk Corinn Skiff, MD;  Location: [facility];  Service: Gastroenterology;  Laterality: N/A;   INDUCED ABORTION     x2   IR CM INJ ANY COLONIC TUBE W/FLUORO  09/29/2021   LAPAROTOMY N/A 09/13/2021   Procedure: EXPLORATORY LAPAROTOMY-Graham PATCH repair;  Surgeon: Tye Millet, DO;  Location: [facility];  Service: General;  Laterality: N/A;    LAPAROTOMY N/A 09/17/2021   Procedure: EXPLORATORY LAPAROTOMY;  Surgeon: Desiderio Schanz, MD;  Location: [facility];  Service: General;  Laterality: N/A;   REPAIR OF PERFORATED ULCER N/A 09/17/2021   Procedure: REPAIR OF PERFORATED ULCER;  Surgeon: Desiderio Schanz, MD;  Location: [facility];  Service: General;  Laterality: N/A;   VAGINAL HYSTERECTOMY N/A 03/13/2018   Procedure: HYSTERECTOMY VAGINAL;  Surgeon: Arloa Lamar SQUIBB, MD;  Location: [facility];  Service: Gynecology;  Laterality: N/A;   Social History:  reports that she has been smoking cigarettes. She has a 20 pack-year smoking history. She has never used smokeless tobacco. She reports current alcohol use of about 2.0 standard drinks of alcohol per week. She reports that she does not use drugs.  Allergies  Allergen Reactions   Anesthesia S-I-40 [Propofol ]     Lethargic after med use   Bee Venom Swelling   Codeine Swelling   Bupropion Anxiety and Other (See Comments)    Worsened anxiety   Family History  Problem Relation Age of Onset   Arthritis Mother    Arthritis Father    Alcohol abuse Father    Hypertension Father    Heart disease Father    Aneurysm Father    Breast cancer Maternal Grandmother    Breast cancer Paternal Grandmother    Family history: Family history reviewed and not pertinent.  Prior  to Admission medications   Medication Sig Start Date End Date Taking? Authorizing Provider  acetaminophen  (TYLENOL ) 325 MG tablet Take 650 mg by mouth every 6 (six) hours as needed.    [provider]  cyanocobalamin  (VITAMIN B12) 1000 MCG tablet Take 1,000 mcg by mouth daily.    [provider]  EPINEPHrine  (EPIPEN  2-PAK) 0.3 mg/0.3 mL IJ SOAJ injection Inject 0.3 mg into the muscle as needed for anaphylaxis. 10/31/22   Cleatus Arlyss RAMAN, MD  gabapentin  (NEURONTIN ) 100 MG capsule Take 200 mg by mouth at bedtime. 02/09/24   [provider]  hydrOXYzine  (ATARAX ) 10 MG tablet Take 1 tablet (10 mg total) by mouth 3  (three) times daily as needed for itching. 02/04/24   Clark, Katherine K, NP  Multiple Vitamins-Minerals (MULTIVITAMIN WITH MINERALS) tablet Take 1 tablet by mouth daily.    [provider]  OVER THE COUNTER MEDICATION AleveX Pain Relieving Spray as directed    [provider]  pantoprazole  (PROTONIX ) 40 MG tablet Take 40 mg by mouth daily.    [provider]  predniSONE  (DELTASONE ) 20 MG tablet 2 tabs po daily for 5 days, then 1 tab po daily for 5 days 03/19/24   Copland, Jacques, MD  pregabalin  (LYRICA ) 75 MG capsule Take 1 capsule (75 mg total) by mouth 2 (two) times daily. 01/08/24   Copland, Jacques, MD  sertraline  (ZOLOFT ) 50 MG tablet Take 1 tablet (50 mg total) by mouth daily. 02/25/24   Bedsole, Teodor Prater E, MD  tiZANidine  (ZANAFLEX ) 4 MG tablet TAKE 1 TABLET BY MOUTH AT BEDTIME AS NEEDED FOR MUSCLE SPASMS. 02/06/24   Watt Jacques, MD   Physical Exam: Vitals:   03/20/24 1352 03/20/24 1442 03/20/24 1600 03/20/24 1734  BP: (!) 188/87 (!) 180/84 (!) 175/86 (!) 169/79  Pulse: 73 73 75 83  Resp:  17 17 18   Temp:    98.7 F (37.1 C)  TempSrc:    Oral  SpO2: 100% 100% 100% 100%  Weight:      Height:       Constitutional: appears age-appropriate, NAD, calm Eyes: PERRL, lids and conjunctivae normal ENMT: Mucous membranes are moist. Posterior pharynx clear of any exudate or lesions. Age-appropriate dentition. Hearing appropriate Neck: normal, supple, no masses, no thyromegaly Respiratory: clear to auscultation bilaterally, no wheezing, no crackles. Normal respiratory effort. No accessory muscle use.  Cardiovascular: Regular rate and rhythm, no murmurs / rubs / gallops. No extremity edema. 2+ pedal pulses. No carotid bruits.  Abdomen: no tenderness, no masses palpated, no hepatosplenomegaly. Bowel sounds positive.  Musculoskeletal: no clubbing / cyanosis. No joint deformity upper and lower extremities. Good ROM, no contractures, no atrophy. Normal muscle tone.  Skin: no  rashes, lesions, ulcers. No induration Neurologic: Sensation intact. Strength 5/5 in all 4.  Psychiatric: Normal judgment and insight. Alert and oriented x 3. Normal mood.   EKG: Not indicated at this time  Chest x-ray on Admission: Ordered  CT ABDOMEN PELVIS W CONTRAST Result Date: 03/20/2024 CLINICAL DATA:  Epigastric abdominal pain. EXAM: CT ABDOMEN AND PELVIS WITH CONTRAST TECHNIQUE: Multidetector CT imaging of the abdomen and pelvis was performed using the standard protocol following bolus administration of intravenous contrast. RADIATION DOSE REDUCTION: This exam was performed according to the departmental dose-optimization program which includes automated exposure control, adjustment of the mA and/or kV according to patient size and/or use of iterative reconstruction technique. CONTRAST:  OMNIPAQUE  IOHEXOL  300 MG/ML  SOLN COMPARISON:  March 27, 2022. FINDINGS: Lower chest: No  acute abnormality. Hepatobiliary: No focal liver abnormality is seen. No gallstones, gallbladder wall thickening, or biliary dilatation. Pancreas: Multiple pancreatic calcifications are again noted consistent with chronic pancreatitis. Stable ductal dilatation is noted. This may be due to multiple ductal calculi in the pancreatic head, the largest measuring approximate 12 mm. Spleen: Normal in size without focal abnormality. Adrenals/Urinary Tract: Adrenal glands are unremarkable. Kidneys are normal, without renal calculi, focal lesion, or hydronephrosis. Bladder is unremarkable. Stomach/Bowel: The appendix is unremarkable. There is no evidence of bowel obstruction. Mild to moderate gastric antral wall thickening is noted with probable ulceration present. Vascular/Lymphatic: Aortic atherosclerosis. No enlarged abdominal or pelvic lymph nodes. Reproductive: Status post hysterectomy. No adnexal masses. Other: No ascites or hernia is noted. Musculoskeletal: No acute or significant osseous findings. IMPRESSION: 1. Mild to  moderate gastric antral wall thickening is noted with probable ulceration present. Endoscopy is recommended for further evaluation. 2. Multiple pancreatic calcifications are again noted consistent with chronic pancreatitis. Stable ductal dilatation is noted which may be due to multiple ductal calculi in the pancreatic head, the largest measuring approximate 12 mm. 3. Aortic atherosclerosis. Aortic Atherosclerosis (ICD10-I70.0). Electronically Signed   By: Lynwood Landy Raddle M.D.   On: 03/20/2024 16:21   Labs on Admission: I have personally reviewed following labs  CBC: Recent Labs  Lab 03/20/24 1328  WBC 9.2  HGB 13.4  HCT 39.9  MCV 103.4*  PLT 162   Basic Metabolic Panel: Recent Labs  Lab 03/20/24 1328  NA 143  K 4.1  CL 109  CO2 19*  GLUCOSE 131*  BUN 16  CREATININE 1.04*  CALCIUM  10.1   GFR: Estimated Creatinine Clearance: 49.5 mL/min (A) (by C-G formula based on SCr of 1.04 mg/dL (H)).  Liver Function Tests: Recent Labs  Lab 03/20/24 1328  AST 31  ALT 14  ALKPHOS 78  BILITOT 1.3*  PROT 8.1  ALBUMIN  4.6   Recent Labs  Lab 03/20/24 1328  LIPASE 67*   Urine analysis:    Component Value Date/Time   COLORURINE YELLOW (A) 03/26/2022 1819   APPEARANCEUR CLEAR (A) 03/26/2022 1819   LABSPEC 1.016 03/26/2022 1819   PHURINE 6.0 03/26/2022 1819   GLUCOSEU NEGATIVE 03/26/2022 1819   HGBUR SMALL (A) 03/26/2022 1819   BILIRUBINUR Negative 01/10/2023 1020   KETONESUR 5 (A) 03/26/2022 1819   PROTEINUR Negative 01/10/2023 1020   PROTEINUR NEGATIVE 03/26/2022 1819   UROBILINOGEN 0.2 01/10/2023 1020   UROBILINOGEN 1.0 04/28/2014 0234   NITRITE Negative 01/10/2023 1020   NITRITE NEGATIVE 03/26/2022 1819   LEUKOCYTESUR Negative 01/10/2023 1020   LEUKOCYTESUR NEGATIVE 03/26/2022 1819   This document was prepared using Dragon Voice Recognition software and may include unintentional dictation errors.  Dr. Sherre [facility]  If 7PM-7AM, please contact  overnight-coverage provider If 7AM-7PM, please contact day attending provider [email or web address]  03/20/2024, 6:31 PM

## 2024-03-20 NOTE — ED Notes (Signed)
 Pt come with c/o now feeling like she might past out.  Pt instructed to sit down. Pt vitals rechecked and BP elevated. Pt states her Bp is normally low. New orders placed at this time.

## 2024-03-20 NOTE — ED Triage Notes (Signed)
 Pt comes with belly pain and vomiting since yesterday. Pt states she was placed on new medicine and has hx of stomach ulcers.

## 2024-03-20 NOTE — ED Notes (Signed)
 BP (!) 180/84   Pulse 73   Temp 98 F (36.7 C)   Resp 17   Ht 5' 2 (1.575 m)   Wt 51.3 kg   LMP 02/24/2018 (Approximate) Comment: spotting   SpO2 100%   BMI 20.67 kg/m   Chief Complaint  Patient presents with   Abdominal Pain   PT states stomach ulcer has worsened yesterday and progressed through night. PT suspects this is due to new medication ordered for back muscle tear (prednisone ). PT states she's had 3 episodes of black diarrhea today and small amounts of bile/emesis multiple times through night. No acute distress at this time. Able to make needs known, call light in reach.

## 2024-03-20 NOTE — Assessment & Plan Note (Signed)
 PDMP reviewed Gabapentin  200 mg nightly resumed on admission

## 2024-03-20 NOTE — Assessment & Plan Note (Addendum)
 Symptomatic support Ondansetron  4 mg every 6 hours as needed for nausea, vomiting, 5 days ordered; Phenergan  12.5 mg IV every 6 hours as needed for refractory nausea and vomiting, 1 day ordered Status post sodium chloride  1 L bolus per EDP we will continue with lactated Ringer 's infusion at 125 mL/h, 1 day ordered Strict I's and O's Portable chest x-ray ordered

## 2024-03-20 NOTE — Significant Event (Signed)
 Tried Hospitalist Note  Patient states to me that she cannot stay at home as she has dogs she needs to take care of.  Her husband is working and cannot come back home.  I discussed the risk of going home including worsening GI bleed secondary to gastric ulcer, severe anemia, and possible death.  Patient understands the risks that she is taking on.  Patient elects to leave AGAINST MEDICAL ADVICE.

## 2024-03-20 NOTE — ED Notes (Signed)
 Patient's O2 noted to be dropping down in the mid 70's on room air. Pt placed on 2L [place]. O2 now 97%. Floy, MD aware

## 2024-03-20 NOTE — ED Notes (Signed)
 Attempted to move patient to the floor- pt crying and refusing to stay. PT reports she was not prepared to stay and needs to go home due to not having anyone else at home. Message sent to Cox, MD with same.

## 2024-03-20 NOTE — Assessment & Plan Note (Addendum)
 With anxiety Home Zoloft  50 mg daily resumed

## 2024-03-20 NOTE — Hospital Course (Signed)
 Maria [name] is a 53 year old female with history of neuropathy, depression, anxiety, GERD, who presents emergency department for chief concerns of abdominal pain and black stool.  Vitals in the ED showed t 98, rr 18, heart rate 73, blood pressure 188/87, SpO2 100% on room air.  Serum sodium is 143, potassium 4.1, chloride 109, bicarb 19, BUN of 16, serum creatinine 1.04, eGFR greater than 60, nonfasting blood glucose 131, WBC 9.2, hemoglobin 13.4, platelet 162.  Lipase is 67.  ED treatment: Dilaudid  1 mg IV one-time dose, ondansetron  4 mg IV one-time dose, Protonix  40 mg IV one-time dose, sodium chloride  1 L bolus.

## 2024-03-20 NOTE — H&P (Incomplete Revision)
 History and Physical   Maria [name] FMW:[id number] DOB: 02/22/1971 DOA: 03/20/2024  PCP: Gretta Comer POUR, NP  Patient coming from: Home  I have personally briefly reviewed patient's old medical records in [facility] EMR.  Chief Concern: Abdominal pain, black stool  HPI: [name] is a 53 year old female with history of neuropathy, depression, anxiety, GERD, who presents emergency department for chief concerns of abdominal pain and black stool.  Vitals in the ED showed t 98, rr 18, heart rate 73, blood pressure 188/87, SpO2 100% on room air.  Serum sodium is 143, potassium 4.1, chloride 109, bicarb 19, BUN of 16, serum creatinine 1.04, eGFR greater than 60, nonfasting blood glucose 131, WBC 9.2, hemoglobin 13.4, platelet 162.  Lipase is 67.  ED treatment: Dilaudid  1 mg IV one-time dose, ondansetron  4 mg IV one-time dose, Protonix  40 mg IV one-time dose, sodium chloride  1 L bolus. --------------------------------- At bedside, patient able to tell me her first and last name, location, current calendar year.  She reports that her age is 53, 29.  She states that the year was 2017.  She then corrected and states it is 2023 and then 2024.  Patient reports she has been having memory problems for the last few months.  She reports her abdominal pain started last night, she vomited 2 times yesterday and about 3 times this morning.  She reports her vomitus is mostly yellow.  She reports she noted black stool today.  She reports this is never happened before.  Social history: She lives at home with her husband and dog.  She denies trauma to her person.  She denies tobacco and recreational drug use.  She reports she currently has cut down a lot on her alcohol.  She states she drinks 1-2 drinks (cocktail, beer) every other day.  ROS: Constitutional: no weight change, no fever ENT/Mouth: no sore throat, no rhinorrhea Eyes: no eye pain, no vision changes Cardiovascular: no chest  pain, no dyspnea,  no edema, no palpitations Respiratory: no cough, no sputum, no wheezing Gastrointestinal: + nausea, + vomiting, no diarrhea, no constipation Genitourinary: no urinary incontinence, no dysuria, no hematuria Musculoskeletal: no arthralgias, no myalgias Skin: no skin lesions, no pruritus, Neuro: no weakness, no loss of consciousness, no syncope Psych: no anxiety, no depression, + decrease appetite Heme/Lymph: no bruising, no bleeding  ED Course: Discussed with EDP, patient requiring hospitalization for chief concerns of intractable nausea and vomiting with melena stools.  Assessment/Plan  Principal Problem:   Intractable nausea and vomiting Active Problems:   PUD (peptic ulcer disease)   Alcohol use disorder   Depression, recurrent (HCC)   Neuropathy   Muscle cramping   MDD (major depressive disorder), recurrent episode, mild (HCC)   Assessment and Plan:  * Intractable nausea and vomiting Symptomatic support Ondansetron  4 mg every 6 hours as needed for nausea, vomiting, 5 days ordered; Phenergan  12.5 mg IV every 6 hours as needed for refractory nausea and vomiting, 1 day ordered Status post sodium chloride  1 L bolus per EDP we will continue with lactated Ringer 's infusion at 125 mL/h, 1 day ordered Strict I's and O's Portable chest x-ray ordered  Alcohol use disorder Midazolam  1 mg IV as needed for withdrawal symptoms, 4 doses ordered with instructions to administer as appropriate and then let provider know  PUD (peptic ulcer disease) With black stool Protonix  40 mg IV twice daily initiated on admission Gastroenterology specialist has been consulted Clear liquid diet; n.p.o.  after midnight in anticipation  of possible endoscopy tomorrow  Depression, recurrent (HCC) With anxiety Home Zoloft  50 mg daily resumed  Neuropathy PDMP reviewed Gabapentin  200 mg nightly resumed on admission  Chart reviewed.   DVT prophylaxis: Protonix  40 mg IV twice  daily Code Status: Full code Diet: Liquid diet; n.p.o. at midnight Family Communication: No Disposition Plan: Pending clinical course Consults called: Gastroenterology Admission status: Telemetry medical, observation  Past Medical History:  Diagnosis Date   Acute on chronic blood loss anemia 11/02/2021   Anemia    Anesthesia complication, initial encounter 09/13/2021   Difficulty waking up from Anesthesia requiring re-intubation   Ankle edema, bilateral 11/15/2021   Anxiety    h/o   Depression    h/o   Folate deficiency 07/30/2019   Gastritis 08/19/2020   GERD (gastroesophageal reflux disease)    tums prn   Hypertension    WAS PUT ON BP MED BY PCP LAST YEAR (2018) AND BP MED MADE PT SICK SO SHE STOPPED TAKING IT-PCP MONITORS BP NOW   Palpitations 05/18/2020   Pancreatitis    Peptic ulcer    Perforated peptic ulcer (HCC) 09/14/2021   Upper GI bleed 04/11/2022   Vitamin B12 deficiency 01/17/2018   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   Past Surgical History:  Procedure Laterality Date   ANTERIOR AND POSTERIOR REPAIR WITH SACROSPINOUS FIXATION N/A 03/13/2018   Procedure: ANTERIOR REPAIR;  Surgeon: Arloa Lamar SQUIBB, MD;  Location: [facility];  Service: Gynecology;  Laterality: N/A;   ESOPHAGOGASTRODUODENOSCOPY (EGD) WITH PROPOFOL  N/A 03/27/2022   Procedure: ESOPHAGOGASTRODUODENOSCOPY (EGD) WITH PROPOFOL ;  Surgeon: Maryruth Ole DASEN, MD;  Location: [facility];  Service: Endoscopy;  Laterality: N/A;   ESOPHAGOGASTRODUODENOSCOPY (EGD) WITH PROPOFOL  N/A 04/12/2022   Procedure: ESOPHAGOGASTRODUODENOSCOPY (EGD) WITH PROPOFOL ;  Surgeon: Unk Corinn Skiff, MD;  Location: [facility];  Service: Gastroenterology;  Laterality: N/A;   INDUCED ABORTION     x2   IR CM INJ ANY COLONIC TUBE W/FLUORO  09/29/2021   LAPAROTOMY N/A 09/13/2021   Procedure: EXPLORATORY LAPAROTOMY-Graham PATCH repair;  Surgeon: Tye Millet, DO;  Location: [facility];  Service: General;  Laterality: N/A;    LAPAROTOMY N/A 09/17/2021   Procedure: EXPLORATORY LAPAROTOMY;  Surgeon: Desiderio Schanz, MD;  Location: [facility];  Service: General;  Laterality: N/A;   REPAIR OF PERFORATED ULCER N/A 09/17/2021   Procedure: REPAIR OF PERFORATED ULCER;  Surgeon: Desiderio Schanz, MD;  Location: [facility];  Service: General;  Laterality: N/A;   VAGINAL HYSTERECTOMY N/A 03/13/2018   Procedure: HYSTERECTOMY VAGINAL;  Surgeon: Arloa Lamar SQUIBB, MD;  Location: [facility];  Service: Gynecology;  Laterality: N/A;   Social History:  reports that she has been smoking cigarettes. She has a 20 pack-year smoking history. She has never used smokeless tobacco. She reports current alcohol use of about 2.0 standard drinks of alcohol per week. She reports that she does not use drugs.  Allergies  Allergen Reactions   Anesthesia S-I-40 [Propofol ]     Lethargic after med use   Bee Venom Swelling   Codeine Swelling   Bupropion Anxiety and Other (See Comments)    Worsened anxiety   Family History  Problem Relation Age of Onset   Arthritis Mother    Arthritis Father    Alcohol abuse Father    Hypertension Father    Heart disease Father    Aneurysm Father    Breast cancer Maternal Grandmother    Breast cancer Paternal Grandmother    Family history: Family history reviewed and not pertinent.  Prior  to Admission medications   Medication Sig Start Date End Date Taking? Authorizing Provider  acetaminophen  (TYLENOL ) 325 MG tablet Take 650 mg by mouth every 6 (six) hours as needed.    [provider]  cyanocobalamin  (VITAMIN B12) 1000 MCG tablet Take 1,000 mcg by mouth daily.    [provider]  EPINEPHrine  (EPIPEN  2-PAK) 0.3 mg/0.3 mL IJ SOAJ injection Inject 0.3 mg into the muscle as needed for anaphylaxis. 10/31/22   Cleatus Arlyss RAMAN, MD  gabapentin  (NEURONTIN ) 100 MG capsule Take 200 mg by mouth at bedtime. 02/09/24   [provider]  hydrOXYzine  (ATARAX ) 10 MG tablet Take 1 tablet (10 mg total) by mouth 3  (three) times daily as needed for itching. 02/04/24   Clark, Katherine K, NP  Multiple Vitamins-Minerals (MULTIVITAMIN WITH MINERALS) tablet Take 1 tablet by mouth daily.    [provider]  OVER THE COUNTER MEDICATION AleveX Pain Relieving Spray as directed    [provider]  pantoprazole  (PROTONIX ) 40 MG tablet Take 40 mg by mouth daily.    [provider]  predniSONE  (DELTASONE ) 20 MG tablet 2 tabs po daily for 5 days, then 1 tab po daily for 5 days 03/19/24   Copland, Jacques, MD  pregabalin  (LYRICA ) 75 MG capsule Take 1 capsule (75 mg total) by mouth 2 (two) times daily. 01/08/24   Copland, Jacques, MD  sertraline  (ZOLOFT ) 50 MG tablet Take 1 tablet (50 mg total) by mouth daily. 02/25/24   Bedsole, Tobenna Needs E, MD  tiZANidine  (ZANAFLEX ) 4 MG tablet TAKE 1 TABLET BY MOUTH AT BEDTIME AS NEEDED FOR MUSCLE SPASMS. 02/06/24   Watt Jacques, MD   Physical Exam: Vitals:   03/20/24 1352 03/20/24 1442 03/20/24 1600 03/20/24 1734  BP: (!) 188/87 (!) 180/84 (!) 175/86 (!) 169/79  Pulse: 73 73 75 83  Resp:  17 17 18   Temp:    98.7 F (37.1 C)  TempSrc:    Oral  SpO2: 100% 100% 100% 100%  Weight:      Height:       Constitutional: appears age-appropriate, NAD, calm Eyes: PERRL, lids and conjunctivae normal ENMT: Mucous membranes are moist. Posterior pharynx clear of any exudate or lesions. Age-appropriate dentition. Hearing appropriate Neck: normal, supple, no masses, no thyromegaly Respiratory: clear to auscultation bilaterally, no wheezing, no crackles. Normal respiratory effort. No accessory muscle use.  Cardiovascular: Regular rate and rhythm, no murmurs / rubs / gallops. No extremity edema. 2+ pedal pulses. No carotid bruits.  Abdomen: no tenderness, no masses palpated, no hepatosplenomegaly. Bowel sounds positive.  Musculoskeletal: no clubbing / cyanosis. No joint deformity upper and lower extremities. Good ROM, no contractures, no atrophy. Normal muscle tone.  Skin: no  rashes, lesions, ulcers. No induration Neurologic: Sensation intact. Strength 5/5 in all 4.  Psychiatric: Normal judgment and insight. Alert and oriented x 3. Normal mood.   EKG: Not indicated at this time  Chest x-ray on Admission: Ordered  CT ABDOMEN PELVIS W CONTRAST Result Date: 03/20/2024 CLINICAL DATA:  Epigastric abdominal pain. EXAM: CT ABDOMEN AND PELVIS WITH CONTRAST TECHNIQUE: Multidetector CT imaging of the abdomen and pelvis was performed using the standard protocol following bolus administration of intravenous contrast. RADIATION DOSE REDUCTION: This exam was performed according to the departmental dose-optimization program which includes automated exposure control, adjustment of the mA and/or kV according to patient size and/or use of iterative reconstruction technique. CONTRAST:  OMNIPAQUE  IOHEXOL  300 MG/ML  SOLN COMPARISON:  March 27, 2022. FINDINGS: Lower chest: No  acute abnormality. Hepatobiliary: No focal liver abnormality is seen. No gallstones, gallbladder wall thickening, or biliary dilatation. Pancreas: Multiple pancreatic calcifications are again noted consistent with chronic pancreatitis. Stable ductal dilatation is noted. This may be due to multiple ductal calculi in the pancreatic head, the largest measuring approximate 12 mm. Spleen: Normal in size without focal abnormality. Adrenals/Urinary Tract: Adrenal glands are unremarkable. Kidneys are normal, without renal calculi, focal lesion, or hydronephrosis. Bladder is unremarkable. Stomach/Bowel: The appendix is unremarkable. There is no evidence of bowel obstruction. Mild to moderate gastric antral wall thickening is noted with probable ulceration present. Vascular/Lymphatic: Aortic atherosclerosis. No enlarged abdominal or pelvic lymph nodes. Reproductive: Status post hysterectomy. No adnexal masses. Other: No ascites or hernia is noted. Musculoskeletal: No acute or significant osseous findings. IMPRESSION: 1. Mild to  moderate gastric antral wall thickening is noted with probable ulceration present. Endoscopy is recommended for further evaluation. 2. Multiple pancreatic calcifications are again noted consistent with chronic pancreatitis. Stable ductal dilatation is noted which may be due to multiple ductal calculi in the pancreatic head, the largest measuring approximate 12 mm. 3. Aortic atherosclerosis. Aortic Atherosclerosis (ICD10-I70.0). Electronically Signed   By: Lynwood Landy Raddle M.D.   On: 03/20/2024 16:21   Labs on Admission: I have personally reviewed following labs  CBC: Recent Labs  Lab 03/20/24 1328  WBC 9.2  HGB 13.4  HCT 39.9  MCV 103.4*  PLT 162   Basic Metabolic Panel: Recent Labs  Lab 03/20/24 1328  NA 143  K 4.1  CL 109  CO2 19*  GLUCOSE 131*  BUN 16  CREATININE 1.04*  CALCIUM  10.1   GFR: Estimated Creatinine Clearance: 49.5 mL/min (A) (by C-G formula based on SCr of 1.04 mg/dL (H)).  Liver Function Tests: Recent Labs  Lab 03/20/24 1328  AST 31  ALT 14  ALKPHOS 78  BILITOT 1.3*  PROT 8.1  ALBUMIN  4.6   Recent Labs  Lab 03/20/24 1328  LIPASE 67*   Urine analysis:    Component Value Date/Time   COLORURINE YELLOW (A) 03/26/2022 1819   APPEARANCEUR CLEAR (A) 03/26/2022 1819   LABSPEC 1.016 03/26/2022 1819   PHURINE 6.0 03/26/2022 1819   GLUCOSEU NEGATIVE 03/26/2022 1819   HGBUR SMALL (A) 03/26/2022 1819   BILIRUBINUR Negative 01/10/2023 1020   KETONESUR 5 (A) 03/26/2022 1819   PROTEINUR Negative 01/10/2023 1020   PROTEINUR NEGATIVE 03/26/2022 1819   UROBILINOGEN 0.2 01/10/2023 1020   UROBILINOGEN 1.0 04/28/2014 0234   NITRITE Negative 01/10/2023 1020   NITRITE NEGATIVE 03/26/2022 1819   LEUKOCYTESUR Negative 01/10/2023 1020   LEUKOCYTESUR NEGATIVE 03/26/2022 1819   This document was prepared using Dragon Voice Recognition software and may include unintentional dictation errors.  Dr. Sherre [facility]  If 7PM-7AM, please contact  overnight-coverage provider If 7AM-7PM, please contact day attending provider [email or web address]  03/20/2024, 6:31 PM

## 2024-03-20 NOTE — Assessment & Plan Note (Addendum)
 With black stool Protonix  40 mg IV twice daily initiated on admission Gastroenterology specialist has been consulted Clear liquid diet; n.p.o.  after midnight in anticipation of possible endoscopy tomorrow

## 2024-03-20 NOTE — ED Notes (Signed)
 PT signed AMA form, [facility], MD aware. PT ambulatory with steady gait out of ED, no acute distress noted.

## 2024-03-24 ENCOUNTER — Ambulatory Visit: Admitting: Primary Care

## 2024-03-30 ENCOUNTER — Other Ambulatory Visit: Payer: Self-pay | Admitting: Primary Care

## 2024-03-30 ENCOUNTER — Other Ambulatory Visit: Payer: Self-pay | Admitting: Family Medicine

## 2024-03-30 DIAGNOSIS — M546 Pain in thoracic spine: Secondary | ICD-10-CM

## 2024-03-30 NOTE — Telephone Encounter (Signed)
 Patient is due for CPE/follow up, this will be required prior to any further refills.  Please schedule, thank you!

## 2024-04-15 ENCOUNTER — Other Ambulatory Visit: Payer: Self-pay | Admitting: Family Medicine

## 2024-04-15 ENCOUNTER — Other Ambulatory Visit: Payer: Self-pay | Admitting: Primary Care

## 2024-04-15 DIAGNOSIS — L299 Pruritus, unspecified: Secondary | ICD-10-CM

## 2024-04-16 ENCOUNTER — Other Ambulatory Visit: Payer: Self-pay | Admitting: Primary Care

## 2024-04-16 ENCOUNTER — Encounter: Admitting: Primary Care

## 2024-04-16 DIAGNOSIS — E876 Hypokalemia: Secondary | ICD-10-CM

## 2024-04-16 DIAGNOSIS — R1010 Upper abdominal pain, unspecified: Secondary | ICD-10-CM

## 2024-04-16 DIAGNOSIS — K279 Peptic ulcer, site unspecified, unspecified as acute or chronic, without hemorrhage or perforation: Secondary | ICD-10-CM

## 2024-04-16 DIAGNOSIS — R051 Acute cough: Secondary | ICD-10-CM

## 2024-04-24 ENCOUNTER — Encounter: Admitting: Family Medicine

## 2024-05-08 ENCOUNTER — Other Ambulatory Visit: Payer: Self-pay | Admitting: Family Medicine

## 2024-05-13 ENCOUNTER — Other Ambulatory Visit: Payer: Self-pay | Admitting: Family Medicine

## 2024-05-13 NOTE — Telephone Encounter (Signed)
 Last office visit 03/19/24 with Dr Watt for back pain.  Last refilled 02/06/24 for #30 with 2 refills.  Next Appt: No future appointments.  Refill?

## 2024-05-14 ENCOUNTER — Telehealth: Payer: Self-pay | Admitting: Primary Care

## 2024-05-14 DIAGNOSIS — Z1231 Encounter for screening mammogram for malignant neoplasm of breast: Secondary | ICD-10-CM

## 2024-05-14 NOTE — Telephone Encounter (Unsigned)
 Copied from CRM #[id number]. Topic: Appointments - Scheduling Inquiry for Clinic >> May 14, 2024 12:31 PM Drema MATSU wrote: Reason for CRM: Patient is wanting to schedule for a mammogram. Please call patient.

## 2024-05-15 NOTE — Telephone Encounter (Signed)
 Noted.  Mammogram order sent to normal breast center in [place].  She will need to call to schedule

## 2024-05-15 NOTE — Telephone Encounter (Signed)
 Lvm asking pt to call back. Need to know if pt has had a mammogram before? If so, what facility? If not, does she prefer [place] or [place] area?

## 2024-05-15 NOTE — Telephone Encounter (Unsigned)
 Copied from CRM #[id number]. Topic: General - Other >> May 15, 2024  1:48 PM Alfonso HERO wrote: Reason for CRM: patient calling back. She says she had a mammogram many years ago at the breast center at [facility] and she prefers to go to [place].

## 2024-05-15 NOTE — Telephone Encounter (Signed)
 Spoke with pt relaying Kate's message. Pt verbalizes understanding and expresses her thanks.

## 2024-05-15 NOTE — Addendum Note (Signed)
 Addended by: Sofya Moustafa K on: 05/15/2024 04:15 PM   Modules accepted: Orders

## 2024-05-18 ENCOUNTER — Telehealth: Admitting: Physician Assistant

## 2024-05-18 DIAGNOSIS — J069 Acute upper respiratory infection, unspecified: Secondary | ICD-10-CM | POA: Diagnosis not present

## 2024-05-18 MED ORDER — ONDANSETRON 4 MG PO TBDP: 4.0000 mg | ORAL_TABLET | Freq: Three times a day (TID) | ORAL | 0 refills | Status: AC | PRN

## 2024-05-18 MED ORDER — FLUTICASONE PROPIONATE 50 MCG/ACT NA SUSP: 2.0000 | Freq: Every day | NASAL | 0 refills | Status: AC

## 2024-05-18 MED ORDER — PROMETHAZINE-DM 6.25-15 MG/5ML PO SYRP: 5.0000 mL | ORAL_SOLUTION | Freq: Four times a day (QID) | ORAL | 0 refills | Status: DC | PRN

## 2024-05-18 NOTE — Progress Notes (Signed)
 Virtual Visit Consent   AMEILIA [name], you are scheduled for a virtual visit with a [facility] provider today. Just as with appointments in the office, your consent must be obtained to participate. Your consent will be active for this visit and any virtual visit you may have with one of our providers in the next 365 days. If you have a MyChart account, a copy of this consent can be sent to you electronically.  As this is a virtual visit, video technology does not allow for your provider to perform a traditional examination. This may limit your provider's ability to fully assess your condition. If your provider identifies any concerns that need to be evaluated in person or the need to arrange testing (such as labs, EKG, etc.), we will make arrangements to do so. Although advances in technology are sophisticated, we cannot ensure that it will always work on either your end or our end. If the connection with a video visit is poor, the visit may have to be switched to a telephone visit. With either a video or telephone visit, we are not always able to ensure that we have a secure connection.  By engaging in this virtual visit, you consent to the provision of healthcare and authorize for your insurance to be billed (if applicable) for the services provided during this visit. Depending on your insurance coverage, you may receive a charge related to this service.  I need to obtain your verbal consent now. Are you willing to proceed with your visit today? [name] has provided verbal consent on 05/18/2024 for a virtual visit (video or telephone). Delon CHRISTELLA Dickinson, PA-C  Date: 05/18/2024 2:11 PM   Virtual Visit via Video Note   I, Delon CHRISTELLA Dickinson, connected with  Maria [name]  ([id number], 11/26/1970) on 05/18/24 at  1:45 PM EST by a video-enabled telemedicine application and verified that I am speaking with the correct person using two identifiers.  Location: Patient: Virtual Visit  Location Patient: Home Provider: Virtual Visit Location Provider: Home Office   I discussed the limitations of evaluation and management by telemedicine and the availability of in person appointments. The patient expressed understanding and agreed to proceed.    History of Present Illness: Maria [name] is a 53 y.o. who identifies as a female who was assigned female at birth, and is being seen today for flu-like symptoms.  HPI: URI  This is a new problem. The current episode started in the past 7 days (Started Saturday with nausea). The problem has been gradually improving. The maximum temperature recorded prior to her arrival was 101 - 101.9 F (101.7, on Nov 15th). The fever has been present for Less than 1 day. Associated symptoms include congestion, coughing (mild, today, mildly productive), diarrhea, headaches, nausea, sinus pain (now improving) and vomiting. Pertinent negatives include no ear pain, plugged ear sensation, rhinorrhea, sore throat or swollen glands. Associated symptoms comments: Body aches, fatigue. Treatments tried: tylenol  sinus, tylenol , sudafed. The treatment provided no relief.     Problems:  Patient Active Problem List   Diagnosis Date Noted   Intractable nausea and vomiting 03/20/2024   MDD (major depressive disorder), recurrent episode, mild 03/09/2024   Thoracic back pain 10/09/2023   Upper abdominal pain 06/14/2023   Muscle cramping 01/10/2023   Hypomagnesemia 04/11/2022   AKI (acute kidney injury) 09/18/2021   Alcohol use disorder 09/18/2021   Malnutrition of moderate degree 09/15/2021   PUD (peptic ulcer disease) 09/14/2021   Chronic pancreatitis (  HCC) 09/13/2021   Vitamin B1 deficiency 05/29/2021   Macrocytosis 05/17/2021   Thrombocytopenia 02/05/2021   Localized skin mass, lump, or swelling 05/18/2020   History of seasonal allergies 05/18/2020   Fatigue 12/09/2019   Poison ivy dermatitis 11/13/2019   Depression, recurrent 11/13/2019   Vitamin D   deficiency 07/30/2019   Encounter for annual general medical examination with abnormal findings in adult 07/30/2019   Bee sting allergy 11/11/2018   Uterine prolapse 02/28/2018   Bilateral carpal tunnel syndrome 11/13/2017   Neuropathy 12/24/2016    Allergies:  Allergies  Allergen Reactions   Anesthesia S-I-40 [Propofol ]     Lethargic after med use   Bee Venom Swelling   Codeine Swelling   Bupropion Anxiety and Other (See Comments)    Worsened anxiety   Medications:  Current Outpatient Medications:    fluticasone  (FLONASE ) 50 MCG/ACT nasal spray, Place 2 sprays into both nostrils daily., Disp: 16 g, Rfl: 0   ondansetron  (ZOFRAN -ODT) 4 MG disintegrating tablet, Take 1 tablet (4 mg total) by mouth every 8 (eight) hours as needed., Disp: 20 tablet, Rfl: 0   promethazine -dextromethorphan (PROMETHAZINE -DM) 6.25-15 MG/5ML syrup, Take 5 mLs by mouth 4 (four) times daily as needed., Disp: 118 mL, Rfl: 0   acetaminophen  (TYLENOL ) 325 MG tablet, Take 650 mg by mouth every 6 (six) hours as needed., Disp: , Rfl:    cyanocobalamin  (VITAMIN B12) 1000 MCG tablet, Take 1,000 mcg by mouth daily., Disp: , Rfl:    EPINEPHrine  (EPIPEN  2-PAK) 0.3 mg/0.3 mL IJ SOAJ injection, Inject 0.3 mg into the muscle as needed for anaphylaxis., Disp: 2 each, Rfl: 0   gabapentin  (NEURONTIN ) 100 MG capsule, Take 200 mg by mouth at bedtime. (Patient not taking: Reported on 03/20/2024), Disp: , Rfl:    hydrOXYzine  (ATARAX ) 10 MG tablet, Take 1 tablet (10 mg total) by mouth 3 (three) times daily as needed for itching. (Patient not taking: Reported on 03/20/2024), Disp: 30 tablet, Rfl: 0   Multiple Vitamins-Minerals (MULTIVITAMIN WITH MINERALS) tablet, Take 1 tablet by mouth daily., Disp: , Rfl:    OVER THE COUNTER MEDICATION, AleveX Pain Relieving Spray as directed, Disp: , Rfl:    pantoprazole  (PROTONIX ) 40 MG tablet, TAKE 1 TABLETBY MOUTH DAILY FOR ULCER, Disp: 90 tablet, Rfl: 0   predniSONE  (DELTASONE ) 20 MG tablet, 2 tabs  po daily for 5 days, then 1 tab po daily for 5 days (Patient not taking: Reported on 03/20/2024), Disp: 15 tablet, Rfl: 0   pregabalin  (LYRICA ) 75 MG capsule, Take 1 capsule (75 mg total) by mouth 2 (two) times daily. (Patient not taking: Reported on 03/20/2024), Disp: 60 capsule, Rfl: 2   sertraline  (ZOLOFT ) 50 MG tablet, Take 1 tablet (50 mg total) by mouth daily. (Patient not taking: Reported on 03/20/2024), Disp: 30 tablet, Rfl: 3   tiZANidine  (ZANAFLEX ) 4 MG tablet, TAKE 1 TABLET BY MOUTH AT BEDTIME AS NEEDED FOR MUSCLE SPASMS., Disp: 30 tablet, Rfl: 2  Observations/Objective: Patient is well-developed, well-nourished in no acute distress.  Resting comfortably at home.  Head is normocephalic, atraumatic.  No labored breathing. Speech is clear and coherent with logical content.  Patient is alert and oriented at baseline.    Assessment and Plan: 1. Viral URI with cough (Primary) - ondansetron  (ZOFRAN -ODT) 4 MG disintegrating tablet; Take 1 tablet (4 mg total) by mouth every 8 (eight) hours as needed.  Dispense: 20 tablet; Refill: 0 - promethazine -dextromethorphan (PROMETHAZINE -DM) 6.25-15 MG/5ML syrup; Take 5 mLs by mouth 4 (four) times daily as needed.  Dispense: 118 mL; Refill: 0 - fluticasone  (FLONASE ) 50 MCG/ACT nasal spray; Place 2 sprays into both nostrils daily.  Dispense: 16 g; Refill: 0  - Suspect viral URI - Symptomatic medications of choice over the counter as needed - Added Flonase  for sinus congestion and drainage - Added Promethazine  DM for nighttime cough - Added Zofran  for nausea - Push fluids - Rest - Seek further evaluation if symptoms change or worsen   Follow Up Instructions: I discussed the assessment and treatment plan with the patient. The patient was provided an opportunity to ask questions and all were answered. The patient agreed with the plan and demonstrated an understanding of the instructions.  A copy of instructions were sent to the patient via MyChart  unless otherwise noted below.    The patient was advised to call back or seek an in-person evaluation if the symptoms worsen or if the condition fails to improve as anticipated.    Delon CHRISTELLA Dickinson, PA-C

## 2024-05-18 NOTE — Patient Instructions (Signed)
 Maria [name], thank you for joining Delon CHRISTELLA Dickinson, PA-C for today's virtual visit.  While this provider is not your primary care provider (PCP), if your PCP is located in our provider database this encounter information will be shared with them immediately following your visit.   A [facility] MyChart account gives you access to today's visit and all your visits, tests, and labs performed at [facility]  click here if you don't have a [facility] MyChart account or go to [email or web address]  Consent: (Patient) Maria [name] provided verbal consent for this virtual visit at the beginning of the encounter.  Current Medications:  Current Outpatient Medications:    fluticasone  (FLONASE ) 50 MCG/ACT nasal spray, Place 2 sprays into both nostrils daily., Disp: 16 g, Rfl: 0   ondansetron  (ZOFRAN -ODT) 4 MG disintegrating tablet, Take 1 tablet (4 mg total) by mouth every 8 (eight) hours as needed., Disp: 20 tablet, Rfl: 0   promethazine -dextromethorphan (PROMETHAZINE -DM) 6.25-15 MG/5ML syrup, Take 5 mLs by mouth 4 (four) times daily as needed., Disp: 118 mL, Rfl: 0   acetaminophen  (TYLENOL ) 325 MG tablet, Take 650 mg by mouth every 6 (six) hours as needed., Disp: , Rfl:    cyanocobalamin  (VITAMIN B12) 1000 MCG tablet, Take 1,000 mcg by mouth daily., Disp: , Rfl:    EPINEPHrine  (EPIPEN  2-PAK) 0.3 mg/0.3 mL IJ SOAJ injection, Inject 0.3 mg into the muscle as needed for anaphylaxis., Disp: 2 each, Rfl: 0   gabapentin  (NEURONTIN ) 100 MG capsule, Take 200 mg by mouth at bedtime. (Patient not taking: Reported on 03/20/2024), Disp: , Rfl:    hydrOXYzine  (ATARAX ) 10 MG tablet, Take 1 tablet (10 mg total) by mouth 3 (three) times daily as needed for itching. (Patient not taking: Reported on 03/20/2024), Disp: 30 tablet, Rfl: 0   Multiple Vitamins-Minerals (MULTIVITAMIN WITH MINERALS) tablet, Take 1 tablet by mouth daily., Disp: , Rfl:    OVER THE COUNTER MEDICATION, AleveX Pain  Relieving Spray as directed, Disp: , Rfl:    pantoprazole  (PROTONIX ) 40 MG tablet, TAKE 1 TABLETBY MOUTH DAILY FOR ULCER, Disp: 90 tablet, Rfl: 0   predniSONE  (DELTASONE ) 20 MG tablet, 2 tabs po daily for 5 days, then 1 tab po daily for 5 days (Patient not taking: Reported on 03/20/2024), Disp: 15 tablet, Rfl: 0   pregabalin  (LYRICA ) 75 MG capsule, Take 1 capsule (75 mg total) by mouth 2 (two) times daily. (Patient not taking: Reported on 03/20/2024), Disp: 60 capsule, Rfl: 2   sertraline  (ZOLOFT ) 50 MG tablet, Take 1 tablet (50 mg total) by mouth daily. (Patient not taking: Reported on 03/20/2024), Disp: 30 tablet, Rfl: 3   tiZANidine  (ZANAFLEX ) 4 MG tablet, TAKE 1 TABLET BY MOUTH AT BEDTIME AS NEEDED FOR MUSCLE SPASMS., Disp: 30 tablet, Rfl: 2   Medications ordered in this encounter:  Meds ordered this encounter  Medications   ondansetron  (ZOFRAN -ODT) 4 MG disintegrating tablet    Sig: Take 1 tablet (4 mg total) by mouth every 8 (eight) hours as needed.    Dispense:  20 tablet    Refill:  0    Supervising Provider:   LAMPTEY, PHILIP O [[id number]]   promethazine -dextromethorphan (PROMETHAZINE -DM) 6.25-15 MG/5ML syrup    Sig: Take 5 mLs by mouth 4 (four) times daily as needed.    Dispense:  118 mL    Refill:  0    Supervising Provider:   LAMPTEY, PHILIP O [id number]   fluticasone  (FLONASE ) 50 MCG/ACT nasal spray    Sig: Place 2  sprays into both nostrils daily.    Dispense:  16 g    Refill:  0    Supervising Provider:   BLAISE ALEENE KIDD [[id number]]     *If you need refills on other medications prior to your next appointment, please contact your pharmacy*  Follow-Up: Call back or seek an in-person evaluation if the symptoms worsen or if the condition fails to improve as anticipated.  [facility] [phone]  Other Instructions Viral Respiratory Infection A respiratory infection is an illness that affects part of the respiratory system, such as the lungs, nose, or throat. A  respiratory infection that is caused by a virus is called a viral respiratory infection. Common types of viral respiratory infections include: A cold. The flu (influenza). A respiratory syncytial virus (RSV) infection. What are the causes? This condition is caused by a virus. The virus may spread through contact with droplets or direct contact with infected people or their mucus or secretions. The virus may spread from person to person (is contagious). What are the signs or symptoms? Symptoms of this condition include: A stuffy or runny nose. A sore throat or cough. Shortness of breath or difficulty breathing. Yellow or green mucus (sputum). Other symptoms may include: A fever. Sweating or chills. Fatigue. Achy muscles. A headache. How is this diagnosed? This condition may be diagnosed based on: Your symptoms. A physical exam. Testing of secretions from the nose or throat. Chest X-ray. How is this treated? This condition may be treated with medicines, such as: Antiviral medicine. This may shorten the length of time a person has symptoms. Expectorants. These make it easier to cough up mucus. Decongestant nasal sprays. Acetaminophen  or NSAIDs, such as ibuprofen, to relieve fever and pain. Antibiotic medicines are not prescribed for viral infections.This is because antibiotics are designed to kill bacteria. They do not kill viruses. Follow these instructions at home: Managing pain and congestion Take over-the-counter and prescription medicines only as told by your health care provider. If you have a sore throat, gargle with a mixture of salt and water  3-4 times a day or as needed. To make salt water , completely dissolve -1 tsp (3-6 g) of salt in 1 cup (237 mL) of warm water . Use nose drops made from salt water  to ease congestion and soften raw skin around your nose. Take 2 tsp (10 mL) of honey at bedtime to lessen coughing at night. Do not give honey to children who are younger  than 1 year. Drink enough fluid to keep your urine pale yellow. This helps prevent dehydration and helps loosen up mucus. General instructions  Rest as much as possible. Do not drink alcohol. Do not use any products that contain nicotine  or tobacco. These products include cigarettes, chewing tobacco, and vaping devices, such as e-cigarettes. If you need help quitting, ask your health care provider. Keep all follow-up visits. This is important. How is this prevented?     Get an annual flu shot. You may get the flu shot in late summer, fall, or winter. Ask your health care provider when you should get your flu shot. Avoid spreading your infection to other people. If you are sick: Wash your hands with soap and water  often, especially after you cough or sneeze. Wash for at least 20 seconds. If soap and water  are not available, use alcohol-based hand sanitizer. Cover your mouth when you cough. Cover your nose and mouth when you sneeze. Do not share cups or eating utensils. Clean commonly used objects  often. Clean commonly touched surfaces. Stay home from work or school as told by your health care provider. Avoid contact with people who are sick during cold and flu season. This is generally fall and winter. Contact a health care provider if: Your symptoms last for 10 days or longer. Your symptoms get worse over time. You have severe sinus pain in your face or forehead. The glands in your jaw or neck become very swollen. You have shortness of breath. Get help right away if you: Feel pain or pressure in your chest. Have trouble breathing. Faint or feel like you will faint. Have severe and persistent vomiting. Feel confused or disoriented. These symptoms may represent a serious problem that is an emergency. Do not wait to see if the symptoms will go away. Get medical help right away. Call your local emergency services (911 in the [place] Do not drive yourself to the hospital. Summary A  respiratory infection is an illness that affects part of the respiratory system, such as the lungs, nose, or throat. A respiratory infection that is caused by a virus is called a viral respiratory infection. Common types of viral respiratory infections include a cold, influenza, and respiratory syncytial virus (RSV) infection. Symptoms of this condition include a stuffy or runny nose, cough, fatigue, achy muscles, sore throat, and fevers or chills. Antibiotic medicines are not prescribed for viral infections. This is because antibiotics are designed to kill bacteria. They are not effective against viruses. This information is not intended to replace advice given to you by your health care provider. Make sure you discuss any questions you have with your health care provider. Document Revised: 09/22/2020 Document Reviewed: 09/22/2020 Elsevier Patient Education  2024 Elsevier Inc.   If you have been instructed to have an in-person evaluation today at a local Urgent Care facility, please use the link below. It will take you to a list of all of our available [facility], including address, phone number and hours of operation. Please do not delay care.  [facility]  If you or a family member do not have a primary care provider, use the link below to schedule a visit and establish care. When you choose a [facility] primary care physician or advanced practice provider, you gain a long-term partner in health. Find a Primary Care Provider  Learn more about [facility]'s in-office and virtual care options: [facility] - Get Care Now

## 2024-05-22 ENCOUNTER — Encounter: Payer: Self-pay | Admitting: *Deleted

## 2024-06-03 ENCOUNTER — Ambulatory Visit: Payer: Self-pay

## 2024-06-03 NOTE — Telephone Encounter (Signed)
 FYI Only or Action Required?: FYI only for provider: appointment scheduled on 06/08/2024 at 3:40 PM-seeing Copland about the numbness related to carpal tunnel.  Patient was last seen in primary care on 05/18/2024 by Vivienne Delon HERO, PA-C.  Called Nurse Triage reporting Numbness.  Symptoms began 10 days ago.  Interventions attempted: Rest, hydration, or home remedies.  Symptoms are: unchanged.  Triage Disposition: See PCP When Office is Open (Within 3 Days)  Patient/caregiver understands and will follow disposition?: Yes  Copied from CRM [id number]. Topic: Clinical - Red Word Triage >> Jun 03, 2024 12:04 PM Maria [name] wrote: Red Word that prompted transfer to Nurse Triage: Pt calling in to schedule an app with ortho Jacques Perfect. Carpel tunnel is acting up. Pt is experiencing numbness in right hand (mid lower arm to finger tips). Transferred to NT Reason for Disposition  [1] MODERATE pain (e.g., interferes with normal activities) AND [2] present > 3 days  Answer Assessment - Initial Assessment Questions Reports that her carpal tunnel in her right hand is acting up. States pain and numbness has been present for 10 days. Patient is asking to see Copland in the office. Scheduled to see him on 06/08/2024 at 3:40 PM  1. ONSET: When did the pain start?     10 days 2. LOCATION: Where is the pain located?     Right hand and wrist pain 3. PAIN: How bad is the pain? (Scale 1-10; or mild, moderate, severe)     6 out of 10 4. WORK OR EXERCISE: Has there been any recent work or exercise that involved this part (i.e., hand or wrist) of the body?     Patient does work with her hands 5. CAUSE: What do you think is causing the pain?     Carpal tunnel concerns 6. AGGRAVATING FACTORS: What makes the pain worse? 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  7. OTHER SYMPTOMS: Do you have any other symptoms? (e.g., fever, neck pain, numbness or tingling,  rash, swelling)     numbness  Protocols used: Hand Pain-A-AH

## 2024-06-07 ENCOUNTER — Encounter: Payer: Self-pay | Admitting: Family Medicine

## 2024-06-07 NOTE — Progress Notes (Unsigned)
 Maria Lipa T. Wilfredo Canterbury, MD, CAQ Sports Medicine [facility] at [facility] [address] [place], [address]  Phone: [phone]  FAX: [phone]  [name] - 53 y.o. female  MRN [id number]  Date of Birth: [birth year]  Date: 06/08/2024  PCP: Gretta Comer POUR, NP  Referral: Gretta Comer POUR, NP  No chief complaint on file.  Subjective:   Maria [name] is a 53 y.o. very pleasant female patient with There is no height or weight on file to calculate BMI. who presents with the following:  Discussed the use of AI scribe software for clinical note transcription with the patient, who gave verbal consent to proceed.  It looks like she actually saw Dr. Leora recently for some ongoing back pain, which also saw her for her, as well.  We had talked about physical therapy a few times, and he also brought this up with her. History of Present Illness     Review of Systems is noted in the HPI, as appropriate  Objective:   LMP 02/24/2018 (Approximate) Comment: spotting   GEN: No acute distress; alert,appropriate. PULM: Breathing comfortably in no respiratory distress PSYCH: Normally interactive.   Laboratory and Imaging Data:  Assessment and Plan:     ICD-10-CM   1. Healthcare maintenance  Z00.00      Assessment & Plan   Medication Management during today's office visit: No orders of the defined types were placed in this encounter.  There are no discontinued medications.  Orders placed today for conditions managed today: No orders of the defined types were placed in this encounter.   Disposition: No follow-ups on file.  [employer] speech-to-text software was used for transcription in this dictation.  Possible transcriptional errors can occur using Animal nutritionist.   Signed,  Jacques DASEN. [name], MD   Outpatient Encounter Medications as of 06/08/2024  Medication Sig   acetaminophen  (TYLENOL ) 325 MG tablet Take 650 mg by  mouth every 6 (six) hours as needed.   cyanocobalamin  (VITAMIN B12) 1000 MCG tablet Take 1,000 mcg by mouth daily.   EPINEPHrine  (EPIPEN  2-PAK) 0.3 mg/0.3 mL IJ SOAJ injection Inject 0.3 mg into the muscle as needed for anaphylaxis.   fluticasone  (FLONASE ) 50 MCG/ACT nasal spray Place 2 sprays into both nostrils daily.   gabapentin  (NEURONTIN ) 100 MG capsule Take 200 mg by mouth at bedtime. (Patient not taking: Reported on 03/20/2024)   hydrOXYzine  (ATARAX ) 10 MG tablet Take 1 tablet (10 mg total) by mouth 3 (three) times daily as needed for itching. (Patient not taking: Reported on 03/20/2024)   Multiple Vitamins-Minerals (MULTIVITAMIN WITH MINERALS) tablet Take 1 tablet by mouth daily.   ondansetron  (ZOFRAN -ODT) 4 MG disintegrating tablet Take 1 tablet (4 mg total) by mouth every 8 (eight) hours as needed.   OVER THE COUNTER MEDICATION AleveX Pain Relieving Spray as directed   pantoprazole  (PROTONIX ) 40 MG tablet TAKE 1 TABLETBY MOUTH DAILY FOR ULCER   predniSONE  (DELTASONE ) 20 MG tablet 2 tabs po daily for 5 days, then 1 tab po daily for 5 days (Patient not taking: Reported on 03/20/2024)   pregabalin  (LYRICA ) 75 MG capsule Take 1 capsule (75 mg total) by mouth 2 (two) times daily. (Patient not taking: Reported on 03/20/2024)   promethazine -dextromethorphan (PROMETHAZINE -DM) 6.25-15 MG/5ML syrup Take 5 mLs by mouth 4 (four) times daily as needed.   sertraline  (ZOLOFT ) 50 MG tablet Take 1 tablet (50 mg total) by mouth daily. (Patient not taking: Reported on 03/20/2024)  tiZANidine  (ZANAFLEX ) 4 MG tablet TAKE 1 TABLET BY MOUTH AT BEDTIME AS NEEDED FOR MUSCLE SPASMS.   No facility-administered encounter medications on file as of 06/08/2024.

## 2024-06-08 ENCOUNTER — Ambulatory Visit: Admitting: Family Medicine

## 2024-06-08 ENCOUNTER — Encounter: Payer: Self-pay | Admitting: Family Medicine

## 2024-06-08 VITALS — BP 172/84 | HR 78 | Temp 97.3°F | Ht 62.0 in | Wt 124.2 lb

## 2024-06-08 DIAGNOSIS — M720 Palmar fascial fibromatosis [Dupuytren]: Secondary | ICD-10-CM

## 2024-06-08 DIAGNOSIS — G5601 Carpal tunnel syndrome, right upper limb: Secondary | ICD-10-CM

## 2024-06-08 DIAGNOSIS — F331 Major depressive disorder, recurrent, moderate: Secondary | ICD-10-CM

## 2024-06-08 MED ORDER — SERTRALINE HCL 50 MG PO TABS: 50.0000 mg | ORAL_TABLET | Freq: Every day | ORAL | 3 refills | Status: AC

## 2024-06-08 NOTE — Patient Instructions (Signed)
 Start the Sertraline  1/2 tablet for  days, then increase to 1 full tablet a day

## 2024-07-03 ENCOUNTER — Other Ambulatory Visit: Payer: Self-pay | Admitting: Family Medicine

## 2024-08-04 ENCOUNTER — Ambulatory Visit: Payer: Self-pay

## 2024-08-04 NOTE — Telephone Encounter (Signed)
 Noted, will evaluate.

## 2024-08-05 ENCOUNTER — Ambulatory Visit: Admitting: Primary Care

## 2024-08-05 ENCOUNTER — Other Ambulatory Visit: Payer: Self-pay | Admitting: Family Medicine

## 2024-08-05 ENCOUNTER — Encounter: Payer: Self-pay | Admitting: Primary Care

## 2024-08-05 VITALS — BP 122/66 | HR 69 | Temp 97.8°F | Ht 62.0 in | Wt 135.4 lb

## 2024-08-05 DIAGNOSIS — R600 Localized edema: Secondary | ICD-10-CM | POA: Insufficient documentation

## 2024-08-05 MED ORDER — FUROSEMIDE 20 MG PO TABS: 20.0000 mg | ORAL_TABLET | Freq: Every day | ORAL | 0 refills | Status: AC

## 2024-08-05 NOTE — Progress Notes (Signed)
 "  Subjective:    Patient ID: Maria [name], female    DOB: 1971-06-24, 54 y.o.   MRN: [id number]  Maria [name] is a very pleasant 54 y.o. female with a history of carpal tunnel syndrome, chronic thoracic back pain, alcohol use disorder with GI bleed, who presents today to discuss joint swelling.  Symptom onset 1 week ago with bilateral ankle pain and swelling after falling on the ice. She was outside picking up a box from [employer], accidentally slipped on ice, fell backwards and hit her tailbone and lower back. She did not hit her head or twist her ankles. Since then she's noticed moderate lower extremity swelling from her toes up distally to knees bilaterally. The swelling causes the pain.   She denies changes in medications or changes in diet, shortness of breath, long travel, changes in cough, injuring her ankle. She is staying hydrated during her day with tea, water , and coffee. She stands and walks for work 9 hours daily five days weekly, she has been doing this for 10 years. When she wakes up her swelling is slightly improved but progresses throughout the day.  She has not tried compression socks/stockings but has some at home.    Review of Systems  Respiratory:  Negative for choking and shortness of breath.   Cardiovascular:  Positive for leg swelling.  Skin:  Negative for color change.         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    Social  History   Socioeconomic History   Marital status: Married    Spouse name: Not on file   Number of children: Not on file   Years of education: Not on file   Highest education level: Some college, no degree  Occupational History   Not on file  Tobacco Use   Smoking status: Every Day    Current packs/day: 1.00    Average packs/day: 1 pack/day for 20.0 years (20.0 ttl pk-yrs)    Types: Cigarettes   Smokeless tobacco: Never  Vaping Use   Vaping status: Never Used  Substance and Sexual Activity   Alcohol use: Yes    Alcohol/week: 2.0 standard drinks of alcohol    Types: 2 Glasses of wine per week   Drug use: No   Sexual activity: Yes  Other Topics Concern   Not on file  Social History Narrative   Married.   1 child.    Working at [employer] at [employer].     Enjoys swimming, camping   Social Drivers of Health   Tobacco Use: High Risk (08/05/2024)   Patient History    Smoking Tobacco Use: Every Day    Smokeless Tobacco Use: Never    Passive Exposure: Not on file  Financial Resource Strain: Low Risk (04/15/2024)   Overall Financial Resource Strain (CARDIA)    Difficulty of Paying Living Expenses: Not hard at all  Food Insecurity: No  Food Insecurity (04/15/2024)   Epic    Worried About Programme Researcher, Broadcasting/film/video in the Last Year: Never true    Ran Out of Food in the Last Year: Never true  Transportation Needs: No Transportation Needs (04/15/2024)   Epic    Lack of Transportation (Medical): No    Lack of Transportation (Non-Medical): No  Physical Activity: Sufficiently Active (04/15/2024)   Exercise Vital Sign    Days of Exercise per Week: 5 days    Minutes of Exercise per Session: 60 min  Stress: No Stress Concern Present (04/15/2024)   [employer] of Occupational Health - Occupational Stress Questionnaire    Feeling of Stress: Only a little  Recent Concern: Stress - Stress Concern Present (02/25/2024)   [employer] of Occupational Health - Occupational Stress Questionnaire     Feeling of Stress: To some extent  Social Connections: Moderately Isolated (04/15/2024)   Social Connection and Isolation Panel    Frequency of Communication with Friends and Family: More than three times a week    Frequency of Social Gatherings with Friends and Family: Twice a week    Attends Religious Services: Never    Database Administrator or Organizations: No    Attends Engineer, Structural: Not on file    Marital Status: Married  Catering Manager Violence: Not At Risk (04/12/2022)   Humiliation, Afraid, Rape, and Kick questionnaire    Fear of Current or Ex-Partner: No    Emotionally Abused: No    Physically Abused: No    Sexually Abused: No  Depression (PHQ2-9): High Risk (08/05/2024)   Depression (PHQ2-9)    PHQ-2 Score: 13  Alcohol Screen: Low Risk (04/15/2024)   Alcohol Screen    Last Alcohol Screening Score (AUDIT): 4  Housing: Low Risk (04/15/2024)   Epic    Unable to Pay for Housing in the Last Year: No    Number of Times Moved in the Last Year: 0    Homeless in the Last Year: No  Utilities: Not At Risk (04/12/2022)   [facility] Utilities    Threatened with loss of utilities: No  Health Literacy: Not on file    Past Surgical History:  Procedure Laterality Date   ANTERIOR AND POSTERIOR REPAIR WITH SACROSPINOUS FIXATION N/A 03/13/2018   Procedure: ANTERIOR REPAIR;  Surgeon: Arloa Lamar SQUIBB, MD;  Location: [facility];  Service: Gynecology;  Laterality: N/A;   ESOPHAGOGASTRODUODENOSCOPY (EGD) WITH PROPOFOL  N/A 03/27/2022   Procedure: ESOPHAGOGASTRODUODENOSCOPY (EGD) WITH PROPOFOL ;  Surgeon: Maryruth Ole DASEN, MD;  Location: [facility];  Service: Endoscopy;  Laterality: N/A;   ESOPHAGOGASTRODUODENOSCOPY (EGD) WITH PROPOFOL  N/A 04/12/2022   Procedure: ESOPHAGOGASTRODUODENOSCOPY (EGD) WITH PROPOFOL ;  Surgeon: Unk Corinn Skiff, MD;  Location: [facility];  Service: Gastroenterology;  Laterality: N/A;   INDUCED ABORTION     x2   IR CM INJ ANY COLONIC TUBE  W/FLUORO  09/29/2021   LAPAROTOMY N/A 09/13/2021   Procedure: EXPLORATORY LAPAROTOMY-Graham PATCH repair;  Surgeon: Tye Millet, DO;  Location: [facility];  Service: General;  Laterality: N/A;   LAPAROTOMY N/A 09/17/2021   Procedure: EXPLORATORY LAPAROTOMY;  Surgeon: Desiderio Schanz, MD;  Location: [facility];  Service: General;  Laterality: N/A;   REPAIR OF PERFORATED ULCER N/A 09/17/2021   Procedure: REPAIR OF PERFORATED ULCER;  Surgeon: Desiderio Schanz, MD;  Location: [facility];  Service: General;  Laterality: N/A;   VAGINAL HYSTERECTOMY N/A 03/13/2018   Procedure: HYSTERECTOMY VAGINAL;  Surgeon: Arloa Lamar SQUIBB, MD;  Location: [facility];  Service:  Gynecology;  Laterality: N/A;    Family History  Problem Relation Age of Onset   Arthritis Mother    Arthritis Father    Alcohol abuse Father    Hypertension Father    Heart disease Father    Aneurysm Father    Breast cancer Maternal Grandmother    Breast cancer Paternal Grandmother     Allergies[1]  Medications Ordered Prior to Encounter[2]  BP 122/66   Pulse 69   Temp 97.8 F (36.6 C) (Oral)   Ht 5' 2 (1.575 m)   Wt 135 lb 6 oz (61.4 kg)   LMP 02/24/2018 Comment: spotting   SpO2 99%   BMI 24.76 kg/m  Objective:   Physical Exam Cardiovascular:     Rate and Rhythm: Normal rate.     Pulses:          Dorsalis pedis pulses are 2+ on the right side and 2+ on the left side.  Pulmonary:     Effort: Pulmonary effort is normal.  Musculoskeletal:     Right lower leg: 2+ Edema present.     Left lower leg: 2+ Edema present.  Neurological:     Mental Status: She is alert.     Physical Exam        Assessment & Plan:  Bilateral lower extremity edema Assessment & Plan: Differentials include venous insufficiency, lymphedema, CHF, liver disease. Less likely DVT given presentation, however she is at higher risk given her smoking history.  Will start with labs today including BNP, CMP, CBC. We discussed the importance of elevating  legs when resting, compression socks. Will trial furosemide  20 mg daily x 5 days to see if this helps.  Consider venous duplex studies/DVT studies.  She will update.   Orders: -     Furosemide ; Take 1 tablet (20 mg total) by mouth daily. For leg swelling.  Dispense: 5 tablet; Refill: 0 -     Comprehensive metabolic panel with GFR -     Brain natriuretic peptide -     Vitamin B12 -     Folate -     CBC with Differential/Platelet    Assessment and Plan Assessment & Plan         Comer MARLA Gaskins, NP       [1]  Allergies Allergen Reactions   Anesthesia S-I-40 [Propofol ]     Lethargic after med use   Bee Venom Swelling   Codeine Swelling   Bupropion Anxiety and Other (See Comments)    Worsened anxiety  [2]  Current Outpatient Medications on File Prior to Visit  Medication Sig Dispense Refill   acetaminophen  (TYLENOL ) 325 MG tablet Take 650 mg by mouth every 6 (six) hours as needed.     cyanocobalamin  (VITAMIN B12) 1000 MCG tablet Take 1,000 mcg by mouth daily.     EPINEPHrine  (EPIPEN  2-PAK) 0.3 mg/0.3 mL IJ SOAJ injection Inject 0.3 mg into the muscle as needed for anaphylaxis. 2 each 0   fluticasone  (FLONASE ) 50 MCG/ACT nasal spray Place 2 sprays into both nostrils daily. 16 g 0   Multiple Vitamins-Minerals (MULTIVITAMIN WITH MINERALS) tablet Take 1 tablet by mouth daily.     ondansetron  (ZOFRAN -ODT) 4 MG disintegrating tablet Take 1 tablet (4 mg total) by mouth every 8 (eight) hours as needed. 20 tablet 0   OVER THE COUNTER MEDICATION AleveX Pain Relieving Spray as directed     pantoprazole  (PROTONIX ) 40 MG tablet TAKE 1 TABLETBY MOUTH DAILY FOR ULCER 90 tablet 0  pregabalin  (LYRICA ) 75 MG capsule Take 1 capsule (75 mg total) by mouth 2 (two) times daily. 60 capsule 2   sertraline  (ZOLOFT ) 50 MG tablet Take 1 tablet (50 mg total) by mouth daily. 30 tablet 3   No current facility-administered medications on file prior to visit.   "

## 2024-08-05 NOTE — Patient Instructions (Signed)
 Apply some light knee high compression socks during the day. Remove during the night.  Elevate your legs anytime you are resting.  Try furosemide  20 mg tablets for fluid.  Take 1 tablet by mouth every morning for 5 days.  Stop by the lab prior to leaving today. I will notify you of your results once received.   It was a pleasure to see you today!

## 2024-08-05 NOTE — Assessment & Plan Note (Addendum)
 Differentials include venous insufficiency, lymphedema, CHF, liver disease. Less likely DVT given presentation, however she is at higher risk given her smoking history.  Will start with labs today including BNP, CMP, CBC. We discussed the importance of elevating legs when resting, compression socks. Will trial furosemide  20 mg daily x 5 days to see if this helps.  Consider venous duplex studies/DVT studies.  She will update.

## 2024-08-06 ENCOUNTER — Ambulatory Visit

## 2024-08-06 ENCOUNTER — Ambulatory Visit: Payer: Self-pay | Admitting: Primary Care

## 2024-08-06 DIAGNOSIS — D649 Anemia, unspecified: Secondary | ICD-10-CM

## 2024-08-06 LAB — COMPREHENSIVE METABOLIC PANEL WITH GFR
ALT: 26 U/L (ref 3–35)
AST: 44 U/L — ABNORMAL HIGH (ref 5–37)
Albumin: 3.4 g/dL — ABNORMAL LOW (ref 3.5–5.2)
Alkaline Phosphatase: 137 U/L — ABNORMAL HIGH (ref 39–117)
BUN: 13 mg/dL (ref 6–23)
CO2: 24 meq/L (ref 19–32)
Calcium: 8.5 mg/dL (ref 8.4–10.5)
Chloride: 111 meq/L (ref 96–112)
Creatinine, Ser: 0.74 mg/dL (ref 0.40–1.20)
GFR: 92.06 mL/min
Glucose, Bld: 88 mg/dL (ref 70–99)
Potassium: 4 meq/L (ref 3.5–5.1)
Sodium: 144 meq/L (ref 135–145)
Total Bilirubin: 1.4 mg/dL — ABNORMAL HIGH (ref 0.2–1.2)
Total Protein: 6.1 g/dL (ref 6.0–8.3)

## 2024-08-06 LAB — CBC WITH DIFFERENTIAL/PLATELET
Basophils Absolute: 0 10*3/uL (ref 0.0–0.1)
Basophils Relative: 0.2 % (ref 0.0–3.0)
Eosinophils Absolute: 0.1 10*3/uL (ref 0.0–0.7)
Eosinophils Relative: 1.7 % (ref 0.0–5.0)
HCT: 30.8 % — ABNORMAL LOW (ref 36.0–46.0)
Hemoglobin: 10.4 g/dL — ABNORMAL LOW (ref 12.0–15.0)
Lymphocytes Relative: 20.3 % (ref 12.0–46.0)
Lymphs Abs: 1.1 10*3/uL (ref 0.7–4.0)
MCHC: 33.6 g/dL (ref 30.0–36.0)
MCV: 114.9 fl — ABNORMAL HIGH (ref 78.0–100.0)
Monocytes Absolute: 0.5 10*3/uL (ref 0.1–1.0)
Monocytes Relative: 9.6 % (ref 3.0–12.0)
Neutro Abs: 3.7 10*3/uL (ref 1.4–7.7)
Neutrophils Relative %: 68.2 % (ref 43.0–77.0)
Platelets: 67 10*3/uL — ABNORMAL LOW (ref 150.0–400.0)
RBC: 2.68 Mil/uL — ABNORMAL LOW (ref 3.87–5.11)
RDW: 17.9 % — ABNORMAL HIGH (ref 11.5–15.5)
WBC: 5.4 10*3/uL (ref 4.0–10.5)

## 2024-08-06 LAB — BRAIN NATRIURETIC PEPTIDE: Pro B Natriuretic peptide (BNP): 78 pg/mL (ref 1.0–100.0)

## 2024-08-06 LAB — IBC + FERRITIN
Ferritin: 25.9 ng/mL (ref 10.0–291.0)
Iron: 67 ug/dL (ref 42–145)
Saturation Ratios: 22 % (ref 20.0–50.0)
TIBC: 305.2 ug/dL (ref 250.0–450.0)
Transferrin: 218 mg/dL (ref 212.0–360.0)

## 2024-08-06 LAB — VITAMIN B12: Vitamin B-12: >1500 pg/mL — ABNORMAL HIGH (ref 211–911)

## 2024-08-06 LAB — FOLATE: Folate: 12.8 ng/mL
